# Patient Record
Sex: Male | Born: 1950 | Race: Black or African American | Hispanic: No | Smoking: Never smoker
Health system: Southern US, Community
[De-identification: ages and names within clinical notes are randomized; demographics above are authoritative.]

## PROBLEM LIST (undated history)

## (undated) DIAGNOSIS — E119 Type 2 diabetes mellitus without complications: Secondary | ICD-10-CM

## (undated) DIAGNOSIS — I639 Cerebral infarction, unspecified: Secondary | ICD-10-CM

## (undated) DIAGNOSIS — C801 Malignant (primary) neoplasm, unspecified: Secondary | ICD-10-CM

## (undated) DIAGNOSIS — E785 Hyperlipidemia, unspecified: Secondary | ICD-10-CM

## (undated) DIAGNOSIS — I10 Essential (primary) hypertension: Secondary | ICD-10-CM

## (undated) DIAGNOSIS — R011 Cardiac murmur, unspecified: Secondary | ICD-10-CM

## (undated) HISTORY — DX: Malignant (primary) neoplasm, unspecified: C80.1

## (undated) HISTORY — PX: OTHER SURGICAL HISTORY: SHX169

## (undated) SURGERY — EGD (ESOPHAGOGASTRODUODENOSCOPY)
Anesthesia: General

---

## 2004-04-02 ENCOUNTER — Emergency Department: Payer: Self-pay | Admitting: Emergency Medicine

## 2006-11-19 ENCOUNTER — Other Ambulatory Visit: Payer: Self-pay

## 2006-11-19 ENCOUNTER — Ambulatory Visit: Payer: Self-pay | Admitting: Urology

## 2006-12-02 ENCOUNTER — Ambulatory Visit: Payer: Self-pay | Admitting: Urology

## 2007-01-18 ENCOUNTER — Emergency Department: Payer: Self-pay

## 2007-08-19 ENCOUNTER — Emergency Department: Payer: Self-pay | Admitting: Emergency Medicine

## 2007-08-19 ENCOUNTER — Other Ambulatory Visit: Payer: Self-pay

## 2007-09-02 ENCOUNTER — Ambulatory Visit: Payer: Self-pay | Admitting: Family Medicine

## 2010-11-18 ENCOUNTER — Ambulatory Visit: Payer: Self-pay | Admitting: Family Medicine

## 2010-11-28 ENCOUNTER — Ambulatory Visit: Payer: Self-pay | Admitting: Family Medicine

## 2010-12-28 ENCOUNTER — Ambulatory Visit: Payer: Self-pay | Admitting: Family Medicine

## 2011-02-06 ENCOUNTER — Emergency Department: Payer: Self-pay | Admitting: *Deleted

## 2011-02-10 ENCOUNTER — Ambulatory Visit: Payer: Self-pay | Admitting: Physician Assistant

## 2015-01-28 DIAGNOSIS — G8191 Hemiplegia, unspecified affecting right dominant side: Secondary | ICD-10-CM | POA: Insufficient documentation

## 2015-01-28 HISTORY — DX: Hemiplegia, unspecified affecting right dominant side: G81.91

## 2015-06-23 ENCOUNTER — Emergency Department: Payer: Medicare Other

## 2015-06-23 ENCOUNTER — Inpatient Hospital Stay
Admission: EM | Admit: 2015-06-23 | Discharge: 2015-06-27 | DRG: 064 | Disposition: A | Discharge status: Home Health | Payer: Medicare Other | Attending: Internal Medicine | Admitting: Internal Medicine

## 2015-06-23 ENCOUNTER — Encounter: Payer: Self-pay | Admitting: Emergency Medicine

## 2015-06-23 DIAGNOSIS — Z79899 Other long term (current) drug therapy: Secondary | ICD-10-CM

## 2015-06-23 DIAGNOSIS — N179 Acute kidney failure, unspecified: Secondary | ICD-10-CM | POA: Diagnosis present

## 2015-06-23 DIAGNOSIS — G936 Cerebral edema: Secondary | ICD-10-CM | POA: Diagnosis not present

## 2015-06-23 DIAGNOSIS — I611 Nontraumatic intracerebral hemorrhage in hemisphere, cortical: Secondary | ICD-10-CM | POA: Diagnosis present

## 2015-06-23 DIAGNOSIS — I10 Essential (primary) hypertension: Secondary | ICD-10-CM | POA: Diagnosis present

## 2015-06-23 DIAGNOSIS — Z8249 Family history of ischemic heart disease and other diseases of the circulatory system: Secondary | ICD-10-CM

## 2015-06-23 DIAGNOSIS — R1310 Dysphagia, unspecified: Secondary | ICD-10-CM

## 2015-06-23 DIAGNOSIS — Z7984 Long term (current) use of oral hypoglycemic drugs: Secondary | ICD-10-CM

## 2015-06-23 DIAGNOSIS — R4701 Aphasia: Secondary | ICD-10-CM | POA: Diagnosis present

## 2015-06-23 DIAGNOSIS — E785 Hyperlipidemia, unspecified: Secondary | ICD-10-CM | POA: Diagnosis present

## 2015-06-23 DIAGNOSIS — R531 Weakness: Secondary | ICD-10-CM

## 2015-06-23 DIAGNOSIS — N289 Disorder of kidney and ureter, unspecified: Secondary | ICD-10-CM

## 2015-06-23 DIAGNOSIS — M6282 Rhabdomyolysis: Secondary | ICD-10-CM | POA: Diagnosis not present

## 2015-06-23 DIAGNOSIS — I63312 Cerebral infarction due to thrombosis of left middle cerebral artery: Secondary | ICD-10-CM | POA: Diagnosis not present

## 2015-06-23 DIAGNOSIS — G8191 Hemiplegia, unspecified affecting right dominant side: Secondary | ICD-10-CM | POA: Diagnosis present

## 2015-06-23 DIAGNOSIS — I340 Nonrheumatic mitral (valve) insufficiency: Secondary | ICD-10-CM | POA: Diagnosis present

## 2015-06-23 DIAGNOSIS — E876 Hypokalemia: Secondary | ICD-10-CM

## 2015-06-23 DIAGNOSIS — R4781 Slurred speech: Secondary | ICD-10-CM | POA: Diagnosis present

## 2015-06-23 DIAGNOSIS — I639 Cerebral infarction, unspecified: Principal | ICD-10-CM | POA: Diagnosis present

## 2015-06-23 DIAGNOSIS — E119 Type 2 diabetes mellitus without complications: Secondary | ICD-10-CM | POA: Diagnosis not present

## 2015-06-23 DIAGNOSIS — E1310 Other specified diabetes mellitus with ketoacidosis without coma: Secondary | ICD-10-CM | POA: Diagnosis not present

## 2015-06-23 DIAGNOSIS — R0902 Hypoxemia: Secondary | ICD-10-CM | POA: Diagnosis present

## 2015-06-23 DIAGNOSIS — R29810 Facial weakness: Secondary | ICD-10-CM | POA: Diagnosis present

## 2015-06-23 DIAGNOSIS — Z23 Encounter for immunization: Secondary | ICD-10-CM

## 2015-06-23 HISTORY — DX: Hyperlipidemia, unspecified: E78.5

## 2015-06-23 HISTORY — DX: Type 2 diabetes mellitus without complications: E11.9

## 2015-06-23 HISTORY — DX: Essential (primary) hypertension: I10

## 2015-06-23 LAB — URINALYSIS COMPLETE WITH MICROSCOPIC (ARMC ONLY)
Bacteria, UA: NONE SEEN
Bilirubin Urine: NEGATIVE
Glucose, UA: NEGATIVE mg/dL
Leukocytes, UA: NEGATIVE
Nitrite: NEGATIVE
Protein, ur: >500 mg/dL — AB
Specific Gravity, Urine: 1.025 (ref 1.005–1.030)
Squamous Epithelial / LPF: NONE SEEN
pH: 5 (ref 5.0–8.0)

## 2015-06-23 LAB — COMPREHENSIVE METABOLIC PANEL
ALT: 16 U/L — ABNORMAL LOW (ref 17–63)
AST: 43 U/L — ABNORMAL HIGH (ref 15–41)
Albumin: 4 g/dL (ref 3.5–5.0)
Alkaline Phosphatase: 80 U/L (ref 38–126)
Anion gap: 10 (ref 5–15)
BUN: 19 mg/dL (ref 6–20)
CO2: 26 mmol/L (ref 22–32)
Calcium: 9.3 mg/dL (ref 8.9–10.3)
Chloride: 106 mmol/L (ref 101–111)
Creatinine, Ser: 1.53 mg/dL — ABNORMAL HIGH (ref 0.61–1.24)
GFR calc Af Amer: 53 mL/min — ABNORMAL LOW (ref >60)
GFR calc non Af Amer: 46 mL/min — ABNORMAL LOW (ref >60)
Glucose, Bld: 137 mg/dL — ABNORMAL HIGH (ref 65–99)
Potassium: 3.4 mmol/L — ABNORMAL LOW (ref 3.5–5.1)
Sodium: 142 mmol/L (ref 135–145)
Total Bilirubin: 0.9 mg/dL (ref 0.3–1.2)
Total Protein: 7.5 g/dL (ref 6.5–8.1)

## 2015-06-23 LAB — SALICYLATE LEVEL: Salicylate Lvl: <4 mg/dL (ref 2.8–30.0)

## 2015-06-23 LAB — PROTIME-INR
INR: 1.09
Prothrombin Time: 14.3 seconds (ref 11.4–15.0)

## 2015-06-23 LAB — CBC WITH DIFFERENTIAL/PLATELET
Basophils Absolute: 0.1 10*3/uL (ref 0–0.1)
Basophils Relative: 1 %
Eosinophils Absolute: 0.2 10*3/uL (ref 0–0.7)
Eosinophils Relative: 2 %
HCT: 43.7 % (ref 40.0–52.0)
Hemoglobin: 13.9 g/dL (ref 13.0–18.0)
Lymphocytes Relative: 17 %
Lymphs Abs: 2 10*3/uL (ref 1.0–3.6)
MCH: 27.3 pg (ref 26.0–34.0)
MCHC: 31.8 g/dL — ABNORMAL LOW (ref 32.0–36.0)
MCV: 86 fL (ref 80.0–100.0)
Monocytes Absolute: 0.9 10*3/uL (ref 0.2–1.0)
Monocytes Relative: 8 %
Neutro Abs: 8.4 10*3/uL — ABNORMAL HIGH (ref 1.4–6.5)
Neutrophils Relative %: 72 %
Platelets: 215 10*3/uL (ref 150–440)
RBC: 5.08 MIL/uL (ref 4.40–5.90)
RDW: 13.8 % (ref 11.5–14.5)
WBC: 11.6 10*3/uL — ABNORMAL HIGH (ref 3.8–10.6)

## 2015-06-23 LAB — ACETAMINOPHEN LEVEL: Acetaminophen (Tylenol), Serum: <10 ug/mL — ABNORMAL LOW (ref 10–30)

## 2015-06-23 LAB — CK: Total CK: 2358 U/L — ABNORMAL HIGH (ref 49–397)

## 2015-06-23 LAB — ETHANOL: Alcohol, Ethyl (B): <5 mg/dL (ref <5)

## 2015-06-23 LAB — TROPONIN I: Troponin I: 0.03 ng/mL (ref <0.031)

## 2015-06-23 MED ORDER — ASPIRIN 325 MG PO TABS
325.0000 mg | ORAL_TABLET | Freq: Every day | ORAL | Status: DC
  Administered 2015-06-24: 325 mg via ORAL

## 2015-06-23 MED ORDER — SODIUM CHLORIDE 0.9 % IV BOLUS (SEPSIS)
500.0000 mL | Freq: Once | INTRAVENOUS | Status: AC
  Administered 2015-06-23: 500 mL via INTRAVENOUS

## 2015-06-23 MED ORDER — SODIUM CHLORIDE 0.9 % IV BOLUS (SEPSIS)
1000.0000 mL | Freq: Once | INTRAVENOUS | Status: AC
  Administered 2015-06-23: 1000 mL via INTRAVENOUS

## 2015-06-23 NOTE — H&P (Signed)
PCP:   No PCP Per Patient   Chief Complaint:  Confused  HPI: This is a 65 year old male whose friends unable to contact him for the past 2 days. They went over and saw him seated on the floor unable to come to the door. 911 was called, the house was forcibly entered and he was brought to the ER. In the ER the patient is disoriented, struggles to answer questions coherently. He is unable to give any detailed history as to what happened or when it started. His nephew came in during my examination and states at baseline this is a independent gentleman who lives alone.  Review of Systems:  Unable to properly   Past Medical History: History reviewed. Diabetes mellitus, hypertension No past surgical history: None  Medications: Prior to Admission medications   Medication Sig Start Date End Date Taking? Authorizing Provider  amLODipine (NORVASC) 5 MG tablet Take 5 mg by mouth daily. 04/11/15  Yes Historical Provider, MD  atorvastatin (LIPITOR) 80 MG tablet Take 80 mg by mouth at bedtime. 04/11/15  Yes Historical Provider, MD  glyBURIDE-metformin (GLUCOVANCE) 5-500 MG tablet Take 1 tablet by mouth 2 (two) times daily. 06/07/15  Yes Historical Provider, MD  lisinopril-hydrochlorothiazide (PRINZIDE,ZESTORETIC) 20-12.5 MG tablet Take 2 tablets by mouth daily. 06/07/15  Yes Historical Provider, MD    Allergies:  No Known Allergies  Social History:  Detected tobacco alcohol or illicit drugs. Patient lives alone  Family History: History reviewed. Coronary artery disease  Physical Exam: Filed Vitals:   06/23/15 2230 06/23/15 2300 06/23/15 2315 06/23/15 2330  BP: 159/82 170/64  171/77  Pulse: 61 66 55 65  Temp:      TempSrc:      Resp:  14 24 20   SpO2: 92% 94% 90% 94%    General:  Confused disoriented patient, does converse and follow instructions sluggishly Eyes: PERRLA, pink conjunctiva, no scleral icterus ENT: Moist oral mucosa, neck supple, no thyromegaly Lungs: clear to ascultation,  no wheeze, no crackles, no use of accessory muscles Cardiovascular: regular rate and rhythm, no regurgitation, no gallops, no murmurs. No carotid bruits, no JVD Abdomen: soft, positive BS, non-tender, non-distended, no organomegaly, not an acute abdomen GU: not examined Neuro: CN II - XII grossly intact except tongue deviates her right, unable to properly assess sensation Musculoskeletal: strength 5/5 all extremities except right lower extremity 3/5, no clubbing, cyanosis or edema Skin: no rash, no subcutaneous crepitation, no decubitus Psych: Confused, disoriented patient    Labs on Admission:   Recent Labs  06/23/15 1849  NA 142  K 3.4*  CL 106  CO2 26  GLUCOSE 137*  BUN 19  CREATININE 1.53*  CALCIUM 9.3    Recent Labs  06/23/15 1849  AST 43*  ALT 16*  ALKPHOS 80  BILITOT 0.9  PROT 7.5  ALBUMIN 4.0   No results for input(s): LIPASE, AMYLASE in the last 72 hours.  Recent Labs  06/23/15 1849  WBC 11.6*  NEUTROABS 8.4*  HGB 13.9  HCT 43.7  MCV 86.0  PLT 215    Recent Labs  06/23/15 1849  CKTOTAL 2358*  TROPONINI 0.03   Invalid input(s): POCBNP No results for input(s): DDIMER in the last 72 hours. No results for input(s): HGBA1C in the last 72 hours. No results for input(s): CHOL, HDL, LDLCALC, TRIG, CHOLHDL, LDLDIRECT in the last 72 hours. No results for input(s): TSH, T4TOTAL, T3FREE, THYROIDAB in the last 72 hours.  Invalid input(s): FREET3 No results for input(s): VITAMINB12, FOLATE,  FERRITIN, TIBC, IRON, RETICCTPCT in the last 72 hours.  Micro Results: No results found for this or any previous visit (from the past 240 hour(s)).   Radiological Exams on Admission: Dg Chest 2 View  06/23/2015  CLINICAL DATA:  Patient with weakness, found naked on the floor. EXAM: CHEST  2 VIEW COMPARISON:  Chest radiograph 02/06/2011. FINDINGS: Stable cardiac and mediastinal contours. Low lung volumes. Mild basilar airspace opacities. No pleural effusion or  pneumothorax. Mid thoracic spine degenerative changes. IMPRESSION: Low lung volumes with basilar atelectasis. Electronically Signed   By: Lovey Newcomer M.D.   On: 06/23/2015 19:22   Ct Head Wo Contrast  06/23/2015  CLINICAL DATA:  Patient with confusion found naked on the floor. EXAM: CT HEAD WITHOUT CONTRAST TECHNIQUE: Contiguous axial images were obtained from the base of the skull through the vertex without intravenous contrast. COMPARISON:  None. FINDINGS: Ventricles and sulci are appropriate for patient's age. There is a large area (3 cm) of hypodensity within the left basal ganglia extending to the left caudate head. There is central increased attenuation (image 14; series 2). Extensive periventricular and subcortical white matter hypodensity. There is mass effect on the frontal horn of left lateral ventricle. Within the peripheral right frontal lobe (image 17; series 2) there is a small area of hypodensity, age indeterminate. Orbits are unremarkable. Paranasal sinuses are well aerated. Mastoid air cells unremarkable. Calvarium is intact. 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 IMPRESSION: There is a subacute left basal ganglia infarct involving the caudate head with associated petechial hemorrhages. There is mass effect on the frontal horn left lateral ventricle. Suggestion of hypodensity, age indeterminate, within the peripheral right frontal lobe raising the possibility of an age-indeterminate infarct versus potentially volume averaging of adjacent sulci. Chronic microvascular ischemic changes. Critical Value/emergent results were called by telephone at the time of interpretation on 06/23/2015 at 7:36 pm to Dr. Charlotte Crumb , who verbally acknowledged these results. Electronically Signed   By: Lovey Newcomer M.D.   On: 06/23/2015 19:42    Assessment/Plan Present on Admission:  . 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 -Lipid panel in a.m., resume home statin dose -PT, OT, speech consulted for the a.m. Patient nothing by mouth -Neurochecks scheduled -Patient with small petechial hemorrhage -Neuro consulted, patient discussed with Dr. Irish Elders neurointensivist. Burnis Medin upgrade patient to ICU and consult intensivist. We will add to mannitol 25g IV every 8 hours as well as serum osmolality every 8 hours  Diabetes mellitus type 2 -Patient is nothing by mouth for now, sliding-scale insulin  Malignant hypertension -See above   SCDs only for DVT prophylaxis  Jaasia Viglione 06/23/2015, 11:45 PM

## 2015-06-23 NOTE — ED Provider Notes (Addendum)
[facility] Emergency Department Provider Note  ____________________________________________   I have reviewed the triage vital signs and the nursing notes.   HISTORY  Chief Complaint Altered Mental Status    HPI Christopher Delacruz is a 65 y.o. male presents to the complaining of nothing. He actually feels "fine". However, it is unclear when the last time he was seen normal was. EMS was called to the house because he would not come to the door for his friends. He kept saying he would be there but never came in after 45 minutes they called 911. 911 and fire went through the window and found him sitting naked on the floor. He was able to walk with assistance to the door. He seemed somewhat confused to them. However, there are stroke scale seemed to be negative. Patient denies any fall or injury. He states he feels "pretty good. He does not know what  concern is. His brother is at bedside. He states the patient is speaking normally. He states that he looks "okay to me". However, he cannot recall the last time he was seen prior to this event. The patient specifically denies any headache stiff neck chest pain shows breath nausea vomiting diarrhea abdominal pain focal numbness focal weakness difficulty talking or difficulty walking.      History reviewed. No pertinent past medical history.  There are no active problems to display for this patient.   No past surgical history on file.  No current outpatient prescriptions on file.  Allergies Review of patient's allergies indicates no known allergies.  History reviewed. No pertinent family history.  Social History Social History  Substance Use Topics  . Smoking status: None  . Smokeless tobacco: None  . Alcohol Use: None    Review of Systems Constitutional: No fever/chills Eyes: No visual changes. ENT: No sore throat. No stiff neck no neck pain Cardiovascular: Denies chest pain. Respiratory: Denies shortness of  breath. Gastrointestinal:   no vomiting.  No diarrhea.  No constipation. Genitourinary: Negative for dysuria. Musculoskeletal: Negative lower extremity swelling Skin: Negative for rash. Neurological: Negative for headaches, focal weakness or numbness. 10-point ROS otherwise negative.  ____________________________________________   PHYSICAL EXAM:  VITAL SIGNS: ED Triage Vitals  Enc Vitals Group     BP 06/23/15 1851 159/86 mmHg     Pulse Rate 06/23/15 1851 78     Resp 06/23/15 1851 20     Temp 06/23/15 1851 97.6 F (36.4 C)     Temp Source 06/23/15 1851 Oral     SpO2 06/23/15 1851 89 %     Weight --      Height --      Head Cir --      Peak Flow --      Pain Score --      Pain Loc --      Pain Edu? --      Excl. in [place]? --     Constitutional: Alert and oriented To name and place states "I forgot" when asked the date although he knows it Saturday. Well appearing and in no acute distress. Eyes: Conjunctivae are normal. PERRL. EOMI. Head: Atraumatic. Nose: No congestion/rhinnorhea. Mouth/Throat: Mucous membranes are moist.  Oropharynx non-erythematous. Neck: No stridor.   Nontender with no meningismus Cardiovascular: Normal rate, regular rhythm. Grossly normal heart sounds.  Good peripheral circulation. Respiratory: Normal respiratory effort.  No retractions. Lungs CTAB. Abdominal: Soft and nontender. No distention. No guarding no rebound Back:  There is no focal tenderness or  step off there is no midline tenderness there are no lesions noted. there is no CVA tenderness Musculoskeletal: No lower extremity tenderness. No joint effusions, no DVT signs strong distal pulses no edema Neurologic: Patient does have a 4+ strength on the right lower extremity versus 5 on the left, his speech seems slurred to me but his brother says it is normal.  Has a slight droop on the right face.  However brother feels that this is his baseline. ftn is within normal limits sensation intact speech ,  no drift on the upper extremity, sensation appears to be intact Skin:  Skin is warm, dry and intact. No rash noted. Psychiatric: Mood and affect are normal. Speech and behavior are normal.  ____________________________________________   LABS (all labs ordered are listed, but only abnormal results are displayed)  Labs Reviewed  CBC WITH DIFFERENTIAL/PLATELET - Abnormal; Notable for the following:    WBC 11.6 (*)    MCHC 31.8 (*)    Neutro Abs 8.4 (*)    All other components within normal limits  PROTIME-INR  CK  URINALYSIS COMPLETEWITH MICROSCOPIC ([facility] ONLY)  ETHANOL  COMPREHENSIVE METABOLIC PANEL  TROPONIN I  ACETAMINOPHEN LEVEL  SALICYLATE LEVEL   ____________________________________________  EKG  I personally interpreted any EKGs ordered by me or triage Normal sinus rhythm rate 70 bpm no acute ST elevation or acute ST depression nonspecific ST changes noted ____________________________________________  RADIOLOGY  I reviewed any imaging ordered by me or triage that were performed during my shift and, if possible, patient and/or family made aware of any abnormal findings. ____________________________________________   PROCEDURES  Procedure(s) performed: None  Critical Care performed: None  ____________________________________________   INITIAL IMPRESSION / ASSESSMENT AND PLAN / ED COURSE  Pertinent labs & imaging results that were available during my care of the patient were reviewed by me and considered in my medical decision making (see chart for details).  Patient apparently was acting bizarrely today was either unable or unwilling to come to the door was sitting naked on the floor. He does have some right-sided weakness to a somewhat variable exam at this time in the lower extremity.  Has a right facial droop. Last time he was normal as absolutely unknown. His brother does not recall the last time he talked room. He thinks it was possibly yesterday. There is  been some concern regarding his speech. It is slurred to my exam but his brother feels that this is his normal speech.In any event, we will obtain CT of the head chest x-ray blood work including total CK and reassess. Vital signs are reassuring aside from a reported I's and set of 89, lungs are clear, this time he has no increased work of breathing we will recheck that to make sure it is real.  ----------------------------------------- 7:51 PM on 06/23/2015 -----------------------------------------  Patient's adamant that he is not on any blood thinners. He states now that he feels like he might of been feeling unwell since Wednesday. This is not a reliable history. CT shows an subacute CVA with a possible mild petechial hemorrhagic convergence. This is likely more than this facility can handle given that we do not have neurosurgery backup if it worsens. We will call [facility] neurology. For this reason I'll defer aspirin. Patient states he takes no aspirin or blood thinners. On record, we have no medications for this patient. The pharmacist is trying to track down what he takes. His brother had no idea.  ----------------------------------------- 7:58 PM on 06/23/2015 -----------------------------------------  Dw/  [facility] neurology who feel pt does not need transfer ____________________________________________   FINAL CLINICAL IMPRESSION(S) / ED DIAGNOSES  Final diagnoses:  None      This chart was dictated using voice recognition software.  Despite best efforts to proofread,  errors can occur which can change meaning.     Schuyler Amor, MD 06/23/15 [place], MD 06/23/15 [place], MD 06/24/15 [phone]

## 2015-06-23 NOTE — ED Notes (Signed)
Spoke w/ ICU, they are unable to take pt until admission orders have been put in.  Spoke w/ MD Crosley about putting in admission orders.  She stated that "my phone is going off every 5 minutes... I will get to it when I get to it".  ICU informed.

## 2015-06-23 NOTE — ED Notes (Signed)
Pt presents to ED via EMS from home c/o altered mental status. EMS called out by pt's friends who were calling pt from outside the house. Friends told EMS pt kept repeating that he was coming to the door but did not show up for 45 min. EMS and fire dept found pt naked on floor, pt states he has been there since Thursday. Presents with slurred speech, O2 sat 89% on RA. Placed on 3L n/c. Last time seen normal Thursday. CBG 136 per EMS.

## 2015-06-24 ENCOUNTER — Inpatient Hospital Stay: Payer: Medicare Other

## 2015-06-24 ENCOUNTER — Encounter: Payer: Self-pay | Admitting: *Deleted

## 2015-06-24 DIAGNOSIS — E1310 Other specified diabetes mellitus with ketoacidosis without coma: Secondary | ICD-10-CM

## 2015-06-24 DIAGNOSIS — I10 Essential (primary) hypertension: Secondary | ICD-10-CM

## 2015-06-24 DIAGNOSIS — I639 Cerebral infarction, unspecified: Principal | ICD-10-CM

## 2015-06-24 DIAGNOSIS — I63312 Cerebral infarction due to thrombosis of left middle cerebral artery: Secondary | ICD-10-CM

## 2015-06-24 LAB — GLUCOSE, CAPILLARY
Glucose-Capillary: 105 mg/dL — ABNORMAL HIGH (ref 65–99)
Glucose-Capillary: 115 mg/dL — ABNORMAL HIGH (ref 65–99)
Glucose-Capillary: 151 mg/dL — ABNORMAL HIGH (ref 65–99)
Glucose-Capillary: 132 mg/dL — ABNORMAL HIGH (ref 65–99)
Glucose-Capillary: 147 mg/dL — ABNORMAL HIGH (ref 65–99)
Glucose-Capillary: 95 mg/dL (ref 65–99)

## 2015-06-24 LAB — HEMOGLOBIN A1C: Hgb A1c MFr Bld: 5.8 % (ref 4.0–6.0)

## 2015-06-24 LAB — LIPID PANEL
Cholesterol: 131 mg/dL (ref 0–200)
HDL: 34 mg/dL — ABNORMAL LOW (ref >40)
LDL Cholesterol: 84 mg/dL (ref 0–99)
Total CHOL/HDL Ratio: 3.9 RATIO
Triglycerides: 66 mg/dL (ref <150)
VLDL: 13 mg/dL (ref 0–40)

## 2015-06-24 LAB — BASIC METABOLIC PANEL
Anion gap: 7 (ref 5–15)
BUN: 16 mg/dL (ref 6–20)
CO2: 27 mmol/L (ref 22–32)
Calcium: 9 mg/dL (ref 8.9–10.3)
Chloride: 109 mmol/L (ref 101–111)
Creatinine, Ser: 1.36 mg/dL — ABNORMAL HIGH (ref 0.61–1.24)
GFR calc Af Amer: >60 mL/min (ref >60)
GFR calc non Af Amer: 53 mL/min — ABNORMAL LOW (ref >60)
Glucose, Bld: 114 mg/dL — ABNORMAL HIGH (ref 65–99)
Potassium: 3.5 mmol/L (ref 3.5–5.1)
Sodium: 143 mmol/L (ref 135–145)

## 2015-06-24 LAB — CBC
HCT: 42.2 % (ref 40.0–52.0)
Hemoglobin: 13.7 g/dL (ref 13.0–18.0)
MCH: 27.9 pg (ref 26.0–34.0)
MCHC: 32.5 g/dL (ref 32.0–36.0)
MCV: 86 fL (ref 80.0–100.0)
Platelets: 180 10*3/uL (ref 150–440)
RBC: 4.91 MIL/uL (ref 4.40–5.90)
RDW: 13.7 % (ref 11.5–14.5)
WBC: 10 10*3/uL (ref 3.8–10.6)

## 2015-06-24 LAB — OSMOLALITY
Osmolality: 300 mOsm/kg — ABNORMAL HIGH (ref 275–295)
Osmolality: 303 mOsm/kg — ABNORMAL HIGH (ref 275–295)

## 2015-06-24 LAB — MRSA PCR SCREENING: MRSA by PCR: NEGATIVE

## 2015-06-24 MED ORDER — AMLODIPINE BESYLATE 5 MG PO TABS
5.0000 mg | ORAL_TABLET | Freq: Every day | ORAL | Status: DC
  Administered 2015-06-24 – 2015-06-27 (×4): 5 mg via ORAL
  Filled 2015-06-24 (×4): qty 1

## 2015-06-24 MED ORDER — METOPROLOL TARTRATE 5 MG/5ML IV SOLN: 5.0000 mg | Freq: Four times a day (QID) | INTRAVENOUS | Status: DC | PRN

## 2015-06-24 MED ORDER — ACETAMINOPHEN 650 MG RE SUPP: 650.0000 mg | Freq: Four times a day (QID) | RECTAL | Status: DC | PRN

## 2015-06-24 MED ORDER — HYDRALAZINE HCL 20 MG/ML IJ SOLN: 10.0000 mg | INTRAMUSCULAR | Status: DC | PRN

## 2015-06-24 MED ORDER — MANNITOL 25 % IV SOLN
25.0000 g | Freq: Three times a day (TID) | INTRAVENOUS | Status: DC
  Administered 2015-06-24: 25 g via INTRAVENOUS
  Filled 2015-06-24 (×5): qty 100

## 2015-06-24 MED ORDER — INSULIN ASPART 100 UNIT/ML ~~LOC~~ SOLN: 0.0000 [IU] | Freq: Every day | SUBCUTANEOUS | Status: DC

## 2015-06-24 MED ORDER — ONDANSETRON HCL 4 MG/2ML IJ SOLN: 4.0000 mg | Freq: Four times a day (QID) | INTRAMUSCULAR | Status: DC | PRN

## 2015-06-24 MED ORDER — PNEUMOCOCCAL VAC POLYVALENT 25 MCG/0.5ML IJ INJ
0.5000 mL | INJECTION | INTRAMUSCULAR | Status: AC
  Administered 2015-06-25: 10:00:00 0.5 mL via INTRAMUSCULAR
  Filled 2015-06-24: qty 0.5

## 2015-06-24 MED ORDER — ACETAMINOPHEN 325 MG PO TABS: 650.0000 mg | ORAL_TABLET | Freq: Four times a day (QID) | ORAL | Status: DC | PRN

## 2015-06-24 MED ORDER — INSULIN ASPART 100 UNIT/ML ~~LOC~~ SOLN
0.0000 [IU] | Freq: Three times a day (TID) | SUBCUTANEOUS | Status: DC
Start: 2015-06-24 — End: 2015-06-27
  Administered 2015-06-24 (×2): 2 [IU] via SUBCUTANEOUS
  Administered 2015-06-25: 13:00:00 3 [IU] via SUBCUTANEOUS
  Administered 2015-06-25: 17:00:00 2 [IU] via SUBCUTANEOUS
  Administered 2015-06-26: 3 [IU] via SUBCUTANEOUS
  Administered 2015-06-26 – 2015-06-27 (×2): 2 [IU] via SUBCUTANEOUS
  Administered 2015-06-27: 3 [IU] via SUBCUTANEOUS
  Filled 2015-06-24: qty 2
  Filled 2015-06-24: qty 3
  Filled 2015-06-24 (×3): qty 2
  Filled 2015-06-24: qty 3
  Filled 2015-06-24: qty 2
  Filled 2015-06-24: qty 3

## 2015-06-24 MED ORDER — ATORVASTATIN CALCIUM 20 MG PO TABS
80.0000 mg | ORAL_TABLET | Freq: Every day | ORAL | Status: DC
  Administered 2015-06-24 – 2015-06-26 (×3): 80 mg via ORAL
  Filled 2015-06-24 (×3): qty 4

## 2015-06-24 MED ORDER — GLYBURIDE 5 MG PO TABS
5.0000 mg | ORAL_TABLET | Freq: Two times a day (BID) | ORAL | Status: DC
  Filled 2015-06-24 (×3): qty 1

## 2015-06-24 MED ORDER — SODIUM CHLORIDE 0.9% FLUSH
3.0000 mL | Freq: Two times a day (BID) | INTRAVENOUS | Status: DC
  Administered 2015-06-24 – 2015-06-27 (×8): 3 mL via INTRAVENOUS

## 2015-06-24 MED ORDER — ONDANSETRON HCL 4 MG PO TABS: 4.0000 mg | ORAL_TABLET | Freq: Four times a day (QID) | ORAL | Status: DC | PRN

## 2015-06-24 MED ORDER — LISINOPRIL 20 MG PO TABS
40.0000 mg | ORAL_TABLET | Freq: Every day | ORAL | Status: DC
  Administered 2015-06-24: 40 mg via ORAL
  Filled 2015-06-24 (×2): qty 2

## 2015-06-24 MED ORDER — LEVETIRACETAM 500 MG/5ML IV SOLN
500.0000 mg | Freq: Two times a day (BID) | INTRAVENOUS | Status: DC
  Administered 2015-06-24: 500 mg via INTRAVENOUS
  Filled 2015-06-24 (×2): qty 5

## 2015-06-24 MED ORDER — SENNOSIDES-DOCUSATE SODIUM 8.6-50 MG PO TABS: 1.0000 | ORAL_TABLET | Freq: Every evening | ORAL | Status: DC | PRN

## 2015-06-24 MED ORDER — GLYBURIDE-METFORMIN 5-500 MG PO TABS: 1.0000 | ORAL_TABLET | Freq: Two times a day (BID) | ORAL | Status: DC

## 2015-06-24 MED ORDER — HYDROCHLOROTHIAZIDE 25 MG PO TABS
25.0000 mg | ORAL_TABLET | Freq: Every day | ORAL | Status: DC
  Administered 2015-06-24: 25 mg via ORAL
  Filled 2015-06-24 (×2): qty 1

## 2015-06-24 MED ORDER — METFORMIN HCL 500 MG PO TABS: 500.0000 mg | ORAL_TABLET | Freq: Two times a day (BID) | ORAL | Status: DC

## 2015-06-24 NOTE — Consult Note (Signed)
CC: L BG stroke  HPI: Christopher Delacruz is an 65 y.o. male whose friends unable to contact him for the past 2 days prior to admission. Information obtained from pt's brother Christopher Delacruz.  They went over and saw him seated on the floor unable to come to the door. 911 was called, the house was forcibly entered and he was brought to the ER. CTH shows subacute L BG stroke and caudate infarct with slight edema.    Past Medical History  Diagnosis Date  . Hypertension   . Hyperlipidemia   . Diabetes mellitus ([place])     History reviewed. No pertinent past surgical history.  History reviewed. No pertinent family history.  Social History:  reports that he has never smoked. He does not have any smokeless tobacco history on file. His alcohol and drug histories are not on file.  No Known Allergies  Medications: I have reviewed the patient's current medications.  ROS: Unable to obtain   Physical Examination: Blood pressure 154/73, pulse 54, temperature 97.6 F (36.4 C), temperature source Oral, resp. rate 17, height 6\' 7"  (2.007 m), weight 116.8 kg (257 lb 8 oz), SpO2 95 %.    Neurological Examination Mental Status: Alert, oriented to name Cranial Nerves: II: Discs flat bilaterally; Visual fields grossly normal, pupils equal, round, reactive to light and accommodation III,IV, VI: ptosis not present, extra-ocular motions intact bilaterally V,VII: right facial droop VIII: hearing normal bilaterally IX,X: gag reflex present XI: bilateral shoulder shrug XII: midline tongue extension Motor: Right : Upper extremity   4/5    Left:     Upper extremity   5/5  Lower extremity   4/5     Lower extremity   5/5 Tone and bulk:normal tone throughout; no atrophy noted Sensory: Pinprick and light touch intact throughout, bilaterally Deep Tendon Reflexes: 1+ and symmetric throughout Plantars: Right: downgoing   Left: downgoing Cerebellar: normal finger-to-nose, normal rapid alternating movements and normal  heel-to-shin test Gait: not tested      Laboratory Studies:   Basic Metabolic Panel:  Recent Labs Lab 06/23/15 1849 06/24/15 0339  NA 142 143  K 3.4* 3.5  CL 106 109  CO2 26 27  GLUCOSE 137* 114*  BUN 19 16  CREATININE 1.53* 1.36*  CALCIUM 9.3 9.0    Liver Function Tests:  Recent Labs Lab 06/23/15 1849  AST 43*  ALT 16*  ALKPHOS 80  BILITOT 0.9  PROT 7.5  ALBUMIN 4.0   No results for input(s): LIPASE, AMYLASE in the last 168 hours. No results for input(s): AMMONIA in the last 168 hours.  CBC:  Recent Labs Lab 06/23/15 1849 06/24/15 0339  WBC 11.6* 10.0  NEUTROABS 8.4*  --   HGB 13.9 13.7  HCT 43.7 42.2  MCV 86.0 86.0  PLT 215 180    Cardiac Enzymes:  Recent Labs Lab 06/23/15 1849  CKTOTAL 2358*  TROPONINI 0.03    BNP: Invalid input(s): POCBNP  CBG:  Recent Labs Lab 06/24/15 0144 06/24/15 0718  GLUCAP 105* 115*    Microbiology: Results for orders placed or performed during the hospital encounter of 06/23/15  MRSA PCR Screening     Status: None   Collection Time: 06/24/15  2:06 AM  Result Value Ref Range Status   MRSA by PCR NEGATIVE NEGATIVE Final    Comment:        The GeneXpert MRSA Assay (FDA approved for NASAL specimens only), is one component of a comprehensive MRSA colonization surveillance program. It is not  intended to diagnose MRSA infection nor to guide or monitor treatment for MRSA infections.     Coagulation Studies:  Recent Labs  06/23/15 1849  LABPROT 14.3  INR 1.09    Urinalysis:  Recent Labs Lab 06/23/15 1955  COLORURINE YELLOW*  LABSPEC 1.025  PHURINE 5.0  GLUCOSEU NEGATIVE  HGBUR 1+*  BILIRUBINUR NEGATIVE  KETONESUR TRACE*  PROTEINUR >500*  NITRITE NEGATIVE  LEUKOCYTESUR NEGATIVE    Lipid Panel:     Component Value Date/Time   CHOL 131 06/24/2015 0339   TRIG 66 06/24/2015 0339   HDL 34* 06/24/2015 0339   CHOLHDL 3.9 06/24/2015 0339   VLDL 13 06/24/2015 0339   LDLCALC 84  06/24/2015 0339    HgbA1C: No results found for: HGBA1C  Urine Drug Screen:  No results found for: LABOPIA, COCAINSCRNUR, LABBENZ, AMPHETMU, THCU, LABBARB  Alcohol Level:  Recent Labs Lab 06/23/15 [place] <5     Dg Chest 2 View  06/23/2015  CLINICAL DATA:  Patient with weakness, found naked on the floor. EXAM: CHEST  2 VIEW COMPARISON:  Chest radiograph 02/06/2011. FINDINGS: Stable cardiac and mediastinal contours. Low lung volumes. Mild basilar airspace opacities. No pleural effusion or pneumothorax. Mid thoracic spine degenerative changes. IMPRESSION: Low lung volumes with basilar atelectasis. Electronically Signed   By: Lovey Newcomer M.D.   On: 06/23/2015 19:22   Ct Head Wo Contrast  06/23/2015  CLINICAL DATA:  Patient with confusion found naked on the floor. EXAM: CT HEAD WITHOUT CONTRAST TECHNIQUE: Contiguous axial images were obtained from the base of the skull through the vertex without intravenous contrast. COMPARISON:  None. FINDINGS: Ventricles and sulci are appropriate for patient's age. There is a large area (3 cm) of hypodensity within the left basal ganglia extending to the left caudate head. There is central increased attenuation (image 14; series 2). Extensive periventricular and subcortical white matter hypodensity. There is mass effect on the frontal horn of left lateral ventricle. Within the peripheral right frontal lobe (image 17; series 2) there is a small area of hypodensity, age indeterminate. Orbits are unremarkable. Paranasal sinuses are well aerated. Mastoid air cells unremarkable. Calvarium is intact. 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 IMPRESSION: There is a subacute left basal ganglia infarct involving the caudate head with associated petechial hemorrhages. There is mass effect on the frontal horn left lateral ventricle. Suggestion of hypodensity, age indeterminate, within the peripheral right frontal lobe raising the possibility of an age-indeterminate infarct  versus potentially volume averaging of adjacent sulci. Chronic microvascular ischemic changes. Critical Value/emergent results were called by telephone at the time of interpretation on 06/23/2015 at 7:36 pm to Dr. Charlotte Crumb , who verbally acknowledged these results. Electronically Signed   By: Lovey Newcomer M.D.   On: 06/23/2015 19:42     Assessment/Plan:  65 y.o. male whose friends unable to contact him for the past 2 days prior to admission. Information obtained from pt's brother Christopher Delacruz.  They went over and saw him seated on the floor unable to come to the door. 911 was called, the house was forcibly entered and he was brought to the ER. CTH shows subacute L BG stroke and caudate infarct with slight edema.     Subcortical subacute infarct with improved mental status.  - d/c mannitol as this is subcortical - no need for anti epileptics for same reason as above - will need pt/ot - anti platelet therapy/statin - lives alone will need case management evaluation/social work for placement - likely  can come out of ICU today - if possible would like MRI brain routine to look for possible other areas of ischemia  Shelia Kingsberry  06/24/2015, 10:18 AM

## 2015-06-24 NOTE — Progress Notes (Addendum)
Pt blood pressure more controlled, did not have to give any prn for blood pressure. Slowly coming down.  Patient remains slow to respond and unable to get out all his words. Had to put 2L O2 on patient for sats on RA 88% - 89%.

## 2015-06-24 NOTE — ED Notes (Signed)
Transporting to ICU with RN Judson Roch

## 2015-06-24 NOTE — Progress Notes (Signed)
[facility] [place] NAME: Christopher Delacruz    MR#:  [id number]  DATE OF BIRTH:  1950-02-26  SUBJECTIVE:  CHIEF COMPLAINT:   Chief Complaint  Patient presents with  . Altered Mental Status  The patient is a 65 year old African-American male with past medical history significant for diabetes, essential hypertension, who presents to the hospital with complaints of right weakness, disorientation, slurred speech, difficult to express himself. On arrival to the hospital patient had a CT scan of head done revealing subacute left basilar ganglia infarct involving cortex Haight had and petechial hemorrhages, there was also mass effect on the frontal horn of left ventricle, patient was initiated on mannitol and the admitted to intensive care unit. He was seen by neurologist, mantle was discontinued and patient was transferred to regular medical floor. Patient complains of right-sided weakness, right lower extremity more than right upper extremity, some facial weakness, difficulty speaking.  Review of Systems  Constitutional: Negative for fever, chills and weight loss.  HENT: Negative for congestion.   Eyes: Negative for blurred vision and double vision.  Respiratory: Negative for cough, sputum production, shortness of breath and wheezing.   Cardiovascular: Negative for chest pain, palpitations, orthopnea, leg swelling and PND.  Gastrointestinal: Negative for nausea, vomiting, abdominal pain, diarrhea, constipation and blood in stool.  Genitourinary: Negative for dysuria, urgency, frequency and hematuria.  Musculoskeletal: Negative for falls.  Neurological: Positive for focal weakness. Negative for dizziness, tremors and headaches.  Endo/Heme/Allergies: Does not bruise/bleed easily.  Psychiatric/Behavioral: Negative for depression. The patient does not have insomnia.     VITAL SIGNS: Blood pressure 191/90, pulse 55, temperature 97.6 F (36.4 C),  temperature source Oral, resp. rate 21, height 6\' 7"  (2.007 m), weight 116.8 kg (257 lb 8 oz), SpO2 93 %.  PHYSICAL EXAMINATION:   GENERAL:  65 y.o.-year-old patient lying in the bed with no acute distress. Poor eye contact, slurring speech, has difficulty expressing himself, mostly answers questions yes and no, appropriately EYES: Pupils equal, round, reactive to light and accommodation. No scleral icterus. Extraocular muscles intact.  HEENT: Head atraumatic, normocephalic. Oropharynx and nasopharynx clear.  NECK:  Supple, no jugular venous distention. No thyroid enlargement, no tenderness.  LUNGS: Normal breath sounds bilaterally, no wheezing, rales,rhonchi or crepitation. No use of accessory muscles of respiration.  CARDIOVASCULAR: S1, S2 normal. No murmurs, rubs, or gallops.  ABDOMEN: Soft, nontender, nondistended. Bowel sounds present. No organomegaly or mass.  EXTREMITIES: No pedal edema, cyanosis, or clubbing.  NEUROLOGIC: Cranial nerves II through XII , mild right-sided weakness, forehead is.Marland Kitchen spared. Muscle strength 2 -3/5 in  right lower extremity, 4/5 right upper extremity, 5 out of 5 in the left-sided extremities. Sensation intact. Gait not checked.  PSYCHIATRIC: The patient is alert and oriented x 3.  SKIN: No obvious rash, lesion, or ulcer.   ORDERS/RESULTS REVIEWED:   CBC  Recent Labs Lab 06/23/15 1849 06/24/15 0339  WBC 11.6* 10.0  HGB 13.9 13.7  HCT 43.7 42.2  PLT 215 180  MCV 86.0 86.0  MCH 27.3 27.9  MCHC 31.8* 32.5  RDW 13.8 13.7  LYMPHSABS 2.0  --   MONOABS 0.9  --   EOSABS 0.2  --   BASOSABS 0.1  --    ------------------------------------------------------------------------------------------------------------------  Chemistries   Recent Labs Lab 06/23/15 1849 06/24/15 0339  NA 142 143  K 3.4* 3.5  CL 106 109  CO2 26 27  GLUCOSE 137* 114*  BUN 19 16  CREATININE 1.53*  1.36*  CALCIUM 9.3 9.0  AST 43*  --   ALT 16*  --   ALKPHOS 80  --    BILITOT 0.9  --    ------------------------------------------------------------------------------------------------------------------ estimated creatinine clearance is 77.8 mL/min (by C-G formula based on Cr of 1.36). ------------------------------------------------------------------------------------------------------------------ No results for input(s): TSH, T4TOTAL, T3FREE, THYROIDAB in the last 72 hours.  Invalid input(s): FREET3  Cardiac Enzymes  Recent Labs Lab 06/23/15 1849  TROPONINI 0.03   ------------------------------------------------------------------------------------------------------------------ Invalid input(s): POCBNP ---------------------------------------------------------------------------------------------------------------  RADIOLOGY: Dg Chest 2 View  06/23/2015  CLINICAL DATA:  Patient with weakness, found naked on the floor. EXAM: CHEST  2 VIEW COMPARISON:  Chest radiograph 02/06/2011. FINDINGS: Stable cardiac and mediastinal contours. Low lung volumes. Mild basilar airspace opacities. No pleural effusion or pneumothorax. Mid thoracic spine degenerative changes. IMPRESSION: Low lung volumes with basilar atelectasis. Electronically Signed   By: Lovey Newcomer M.D.   On: 06/23/2015 19:22   Ct Head Wo Contrast  06/23/2015  CLINICAL DATA:  Patient with confusion found naked on the floor. EXAM: CT HEAD WITHOUT CONTRAST TECHNIQUE: Contiguous axial images were obtained from the base of the skull through the vertex without intravenous contrast. COMPARISON:  None. FINDINGS: Ventricles and sulci are appropriate for patient's age. There is a large area (3 cm) of hypodensity within the left basal ganglia extending to the left caudate head. There is central increased attenuation (image 14; series 2). Extensive periventricular and subcortical white matter hypodensity. There is mass effect on the frontal horn of left lateral ventricle. Within the peripheral right frontal lobe (image  17; series 2) there is a small area of hypodensity, age indeterminate. Orbits are unremarkable. Paranasal sinuses are well aerated. Mastoid air cells unremarkable. Calvarium is intact. 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 IMPRESSION: There is a subacute left basal ganglia infarct involving the caudate head with associated petechial hemorrhages. There is mass effect on the frontal horn left lateral ventricle. Suggestion of hypodensity, age indeterminate, within the peripheral right frontal lobe raising the possibility of an age-indeterminate infarct versus potentially volume averaging of adjacent sulci. Chronic microvascular ischemic changes. Critical Value/emergent results were called by telephone at the time of interpretation on 06/23/2015 at 7:36 pm to Dr. Charlotte Crumb , who verbally acknowledged these results. Electronically Signed   By: Lovey Newcomer M.D.   On: 06/23/2015 19:42   Mr Jodene Nam Head Wo Contrast  06/24/2015  CLINICAL DATA:  Stroke EXAM: MRI HEAD WITHOUT CONTRAST MRA HEAD WITHOUT CONTRAST TECHNIQUE: Multiplanar, multiecho pulse sequences of the brain and surrounding structures were obtained without intravenous contrast. Angiographic images of the head were obtained using MRA technique without contrast. COMPARISON:  CT head 06/23/2015 FINDINGS: MRI HEAD FINDINGS Acute infarct in the left basal ganglia involving the putamen and caudate. There is petechial hemorrhage within the putamen infarct as noted on CT. Mild local mass-effect on the lateral ventricle without midline shift. No other acute infarct. Hypodensity right frontal lobe on CT appears to represent volume averaging of a sulcus. Moderate chronic microvascular ischemic changes throughout the white matter. Brainstem and cerebellum intact. Cerebral volume normal for age.  Ventricles not enlarged. Negative for mass lesion. Paranasal sinuses clear. No orbital mass. Pituitary normal in size. MRA HEAD FINDINGS Both vertebral arteries patent to the  basilar. Left vertebral dominant. PICA patent bilaterally. Mild stenosis distal right vertebral artery. Superior cerebellar and posterior cerebral arteries patent bilaterally Internal carotid artery patent bilaterally without significant stenosis or aneurysm. Anterior and middle cerebral arteries patent bilaterally without significant stenosis.  Negative for cerebral aneurysm. IMPRESSION: Acute infarct in the left putamen and caudate with petechial hemorrhage. Mild mass-effect without local midline shift. Findings are similar to the recent CT. No other acute infarct Moderate chronic microvascular ischemic change in the white matter Negative MRA circle of  Willis. Electronically Signed   By: Franchot Gallo M.D.   On: 06/24/2015 14:24   Mr Brain Wo Contrast  06/24/2015  CLINICAL DATA:  Stroke EXAM: MRI HEAD WITHOUT CONTRAST MRA HEAD WITHOUT CONTRAST TECHNIQUE: Multiplanar, multiecho pulse sequences of the brain and surrounding structures were obtained without intravenous contrast. Angiographic images of the head were obtained using MRA technique without contrast. COMPARISON:  CT head 06/23/2015 FINDINGS: MRI HEAD FINDINGS Acute infarct in the left basal ganglia involving the putamen and caudate. There is petechial hemorrhage within the putamen infarct as noted on CT. Mild local mass-effect on the lateral ventricle without midline shift. No other acute infarct. Hypodensity right frontal lobe on CT appears to represent volume averaging of a sulcus. Moderate chronic microvascular ischemic changes throughout the white matter. Brainstem and cerebellum intact. Cerebral volume normal for age.  Ventricles not enlarged. Negative for mass lesion. Paranasal sinuses clear. No orbital mass. Pituitary normal in size. MRA HEAD FINDINGS Both vertebral arteries patent to the basilar. Left vertebral dominant. PICA patent bilaterally. Mild stenosis distal right vertebral artery. Superior cerebellar and posterior cerebral arteries  patent bilaterally Internal carotid artery patent bilaterally without significant stenosis or aneurysm. Anterior and middle cerebral arteries patent bilaterally without significant stenosis. Negative for cerebral aneurysm. IMPRESSION: Acute infarct in the left putamen and caudate with petechial hemorrhage. Mild mass-effect without local midline shift. Findings are similar to the recent CT. No other acute infarct Moderate chronic microvascular ischemic change in the white matter Negative MRA circle of  Willis. Electronically Signed   By: Franchot Gallo M.D.   On: 06/24/2015 14:24   US Carotid Bilateral  06/24/2015  CLINICAL DATA:  65 year old male with acute left basal ganglia infarct EXAM: BILATERAL CAROTID DUPLEX ULTRASOUND TECHNIQUE: Pearline Cables scale imaging, color Doppler and duplex ultrasound were performed of bilateral carotid and vertebral arteries in the neck. COMPARISON:  Brain MRI 06/24/2015 FINDINGS: Criteria: Quantification of carotid stenosis is based on velocity parameters that correlate the residual internal carotid diameter with NASCET-based stenosis levels, using the diameter of the distal internal carotid lumen as the denominator for stenosis measurement. The following velocity measurements were obtained: RIGHT ICA:  52/11 cm/sec CCA:  [id number] cm/sec SYSTOLIC ICA/CCA RATIO:  0.3 DIASTOLIC ICA/CCA RATIO:  0.9 ECA:  117 cm/sec LEFT ICA:  99/18 cm/sec CCA:  [id number] cm/sec SYSTOLIC ICA/CCA RATIO:  0.5 DIASTOLIC ICA/CCA RATIO:  1.3 125 ECA:   cm/sec RIGHT CAROTID ARTERY: Trace focal heterogeneous atherosclerotic plaque in the proximal internal carotid artery. By peak systolic velocity criteria the estimated stenosis remains less than 50%. RIGHT VERTEBRAL ARTERY:  Patent with normal antegrade flow. LEFT CAROTID ARTERY: Tortuous anatomy. No focal atherosclerotic plaque or evidence of stenosis. LEFT VERTEBRAL ARTERY:  Patent with normal antegrade flow. IMPRESSION: 1. Mild (1-49%) stenosis proximal right internal  carotid artery secondary to heterogenous atherosclerotic plaque. 2. No significant focal plaque or stenosis on the left. 3. Vertebral arteries are patent with normal antegrade flow. Signed, Criselda Peaches, MD [facility] Electronically Signed   By: Jacqulynn Cadet M.D.   On: 06/24/2015 13:43    EKG:  Orders placed or performed during the hospital encounter of 06/23/15  . EKG 12-Lead  .  EKG 12-Lead  . ED EKG  . ED EKG    ASSESSMENT AND PLAN:  Active Problems:   CVA (cerebral infarction)   Diabetes (HCC)   HTN (hypertension) #1. Subacute stroke in subcortical region with right-sided weakness, slurred speech, continue patient on aspirin, Lipitor, get occupational therapist., Physical therapist involved for rehabilitation planning. Patient was seen by neurologist, appreciated recommendations. MRI of brain revealed acute infarct in left putamen  and caudate nucleus with petechial hemorrhage, mild mass effect without midline shift, negative circle of Willis MRA, carotid ultrasound showed mild from 1-49% stenosis in proximal right internal carotid artery, no significant stenosis on the left. Echocardiogram with bubble study is pending #2. Malignant Essential hypertension, permissive hypertension, continue current therapy, hydralazine as needed intravenously   #3. Renal insufficiency, unclear acute versus chronic, likely long-standing hypertension related, follow in the morning, seems to be improving with hydralazine, amlodipine #4 diabetes mellitus, continue patient on sliding scale insulin #5. Dysphagia, SLP to evaluate patient, change diet to dysphagia 3 with nectar Thick liquids, follow clinically  Management plans discussed with the patient, family and they are in agreement.   DRUG ALLERGIES: No Known Allergies  CODE STATUS:     Code Status Orders        Start     Ordered   06/24/15 0154  Full code   Continuous      06/24/15 0153    Code Status History    Date Active Date Inactive Code Status Order ID Comments User Context   This patient has a current code status but no historical code status.      TOTAL TIME TAKING CARE OF THIS PATIENT: 40 minutes.  Discussed this patient's family  Israel Wunder M.D on 06/24/2015 at 3:49 PM  Between 7am to 6pm - Pager - [phone]  After 6pm go to [email or web address] - password [email or web address] [facility]  [facility]  Office  [phone]  CC: Primary care physician; No PCP Per Patient

## 2015-06-24 NOTE — Progress Notes (Signed)
Report given to Hiram Gash RN

## 2015-06-24 NOTE — Evaluation (Signed)
Physical Therapy Evaluation Patient Details Name: Christopher Delacruz MRN: [id number] DOB: [birth year] Today's Date: 06/24/2015   History of Present Illness  65 y/o male who was found on the foor with L sided weakness and AMS.    Clinical Impression  Pt showed great effort with all requested PT acts and was eager to do as much as he could on his own.  Pt was clearly frustrated with inability to put socks on, control R UE and generally seemed to not fully understanding his limitations.  Pt did surprisingly well with transfers and ambulation but despite not having any overt LOBs he displayed some safety issues and unsteadiness.     Follow Up Recommendations SNF (pt would much rather go home, unsafe and would need 24/7)    Equipment Recommendations  Rolling walker with 5" wheels    Recommendations for Other Services       Precautions / Restrictions Precautions Precautions: Fall Restrictions Weight Bearing Restrictions: No      Mobility  Bed Mobility Overal bed mobility: Needs Assistance Bed Mobility: Supine to Sit     Supine to sit: Min assist     General bed mobility comments: Pt highly motivated to get up w/o any assist, he does well and needs only very light assist  Transfers Overall transfer level: Modified independent Equipment used: Rolling walker (2 wheeled)             General transfer comment: Pt is able to get to standing w/o direct assist, he needs heavy use of UEs and cuing/set up to rise  Ambulation/Gait Ambulation/Gait assistance: Min assist Ambulation Distance (Feet): 30 Feet Assistive device: Rolling walker (2 wheeled)       General Gait Details: Pt does surprisingly well with ambulation using walker and though he has some unsteadiness he ultimately did not have any overt LOBs ro signficant stagger steps  Stairs            Wheelchair Mobility    Modified Rankin (Stroke Patients Only)       Balance Overall balance assessment:  (good static  standing balance with walker, decreased dynamic )                                           Pertinent Vitals/Pain Pain Assessment: No/denies pain    Home Living Family/patient expects to be discharged to:: Skilled nursing facility Living Arrangements: Alone Available Help at Discharge: Friend(s)                  Prior Function Level of Independence: Independent         Comments: pt able to take care of errands, ADLs, etc w/o issue     Hand Dominance        Extremity/Trunk Assessment   Upper Extremity Assessment: LUE deficits/detail       LUE Deficits / Details: Pt with significant coordination issues and decreased quality of motion, strength grossly 3/5   Lower Extremity Assessment: LLE deficits/detail   LLE Deficits / Details: grossly 4-/5, decreased quality of motion     Communication   Communication: Expressive difficulties  Cognition Arousal/Alertness: Awake/alert Behavior During Therapy: Restless Overall Cognitive Status: Difficult to assess                      General Comments      Exercises  Assessment/Plan    PT Assessment Patient needs continued PT services  PT Diagnosis Difficulty walking;Generalized weakness;Hemiplegia dominant side   PT Problem List Decreased strength;Decreased range of motion;Decreased activity tolerance;Decreased balance;Decreased mobility;Decreased coordination;Decreased cognition;Decreased knowledge of use of DME;Decreased safety awareness  PT Treatment Interventions Gait training;DME instruction;Functional mobility training;Therapeutic activities;Therapeutic exercise;Balance training;Neuromuscular re-education;Stair training   PT Goals (Current goals can be found in the Care Plan section) Acute Rehab PT Goals Patient Stated Goal: go home PT Goal Formulation: With patient Time For Goal Achievement: 07/08/15 Potential to Achieve Goals: Fair    Frequency 7X/week   Barriers to  discharge        Co-evaluation               End of Session Equipment Utilized During Treatment: Gait belt Activity Tolerance: Patient tolerated treatment well Patient left: with bed alarm set;with call bell/phone within reach           Time: 1645-1714 PT Time Calculation (min) (ACUTE ONLY): 29 min   Charges:   PT Evaluation $PT Eval Moderate Complexity: 1 Procedure     PT G CodesKreg Shropshire, DPT 06/24/2015, 5:45 PM

## 2015-06-24 NOTE — Consult Note (Signed)
PULMONARY / CRITICAL CARE MEDICINE   Name: Christopher Delacruz MRN: [id number] DOB: Jun 04, 1950    ADMISSION DATE:  06/23/2015   CONSULTATION DATE:  06/24/2015  REFERRING MD:  Hospitalist  CHIEF COMPLAINT:  Acute change in mental status  HISTORY OF PRESENT ILLNESS: This is a 65 year old African-American male with a past medical history of hypertension, hyperlipidemia and type 2 diabetes who presents to the ED via EMS for acute change in level of consciousness. History is limited due to patient's confusion. Most of the history is obtained from ED, and EMS records. Per ED records, EMS was called to patient's home by his friends who were concerned because patient could not open the door for them for about 45 minutes. He kept saying he was coming to the door, but never came hence EMS was called. Alongside the fire department. Patient was found on the floor naked, confused with slurred speech and hypoxic with oxygen saturation of 89% on room air. He was placed on oxygen and transferred to the emergency room. At the ED, CT head revealed a subacute acute left basal ganglia infarct with hemorrhagic conversion and mass effect on the frontal horn left lateral ventricle. The last time patient was seen normal was Thursday 06/21/15. Given the hemorrhagic conversion alongside the uncertain duration of onset of symptoms, he was deemed to  not  Be a good candidate for any intervention by the neuro intensivist. However, he was started on mannitol for cerebral edema. PCM was consulted for further management. Patient is awake and following commands. His speech is still garbled and he has significant memory lapses. He is currently on room air with a normal oxygen saturation level. Patient is unable to relay any precipitating events.  PAST MEDICAL HISTORY :  He  has a past medical history of Hypertension; Hyperlipidemia; and Diabetes mellitus ([place]).  PAST SURGICAL HISTORY: He  has no past surgical history on file.  No Known  Allergies  No current facility-administered medications on file prior to encounter.   No current outpatient prescriptions on file prior to encounter.    FAMILY HISTORY:  His has no family status information on file.   SOCIAL HISTORY: He  reports that he has never smoked. He does not have any smokeless tobacco history on file.  REVIEW OF SYSTEMS:   Constitutional: Negative for fever and chills.  HENT: Negative for congestion and rhinorrhea.  Eyes: Negative for redness and visual disturbance.  Respiratory: Negative for shortness of breath and wheezing.  Cardiovascular: Negative for chest pain and palpitations.  Gastrointestinal: Negative  for nausea , vomiting and abdominal pain and loose stools Genitourinary: Negative for dysuria and urgency.  Musculoskeletal: Negative for myalgias and arthralgias.  Skin: Negative for pallor and wound.  Neurological: Negative for dizziness and headaches   SUBJECTIVE:   VITAL SIGNS: BP 169/89 mmHg  Pulse 55  Temp(Src) 98.5 F (36.9 C) (Oral)  Resp 19  Ht 6\' 7"  (2.007 m)  Wt 257 lb 8 oz (116.8 kg)  BMI 29.00 kg/m2  SpO2 92%  HEMODYNAMICS:    VENTILATOR SETTINGS:    INTAKE / OUTPUT:    PHYSICAL EXAMINATION: General: No acute distress Neuro:  Alert and oriented to person only, speech is slurred, mild expressive aphasia, grip strength, diminished in the right compared to the left, mild right arm drift, mild right facial droop, tongue protrudes in midline, recall 2/3, gag reflex intact HEENT: Normocephalic and atraumatic, PERRLA, oral mucosa moist and pink, trachea midline Cardiovascular: Rate and rhythm regular, S1, S2,  no murmur, regurg or gallop Lungs:  Lungs are clear to auscultation bilaterally Abdomen: Abdomen is soft, nontender with normal bowel sounds Musculoskeletal: Limited range of motion and right knee, no visible deformities . Extremities: +2 pulses, no edema Skin:  Warm and dry  LABS:  BMET  Recent Labs Lab  06/23/15 1849 06/24/15 0339  NA 142 143  K 3.4* 3.5  CL 106 109  CO2 26 27  BUN 19 16  CREATININE 1.53* 1.36*  GLUCOSE 137* 114*    Electrolytes  Recent Labs Lab 06/23/15 1849 06/24/15 0339  CALCIUM 9.3 9.0    CBC  Recent Labs Lab 06/23/15 1849 06/24/15 0339  WBC 11.6* 10.0  HGB 13.9 13.7  HCT 43.7 42.2  PLT 215 180    Coag's  Recent Labs Lab 06/23/15 1849  INR 1.09    Sepsis Markers No results for input(s): LATICACIDVEN, PROCALCITON, O2SATVEN in the last 168 hours.  ABG No results for input(s): PHART, PCO2ART, PO2ART in the last 168 hours.  Liver Enzymes  Recent Labs Lab 06/23/15 1849  AST 43*  ALT 16*  ALKPHOS 80  BILITOT 0.9  ALBUMIN 4.0    Cardiac Enzymes  Recent Labs Lab 06/23/15 1849  TROPONINI 0.03    Glucose  Recent Labs Lab 06/24/15 0144  GLUCAP 105*    Imaging Dg Chest 2 View  06/23/2015  CLINICAL DATA:  Patient with weakness, found naked on the floor. EXAM: CHEST  2 VIEW COMPARISON:  Chest radiograph 02/06/2011. FINDINGS: Stable cardiac and mediastinal contours. Low lung volumes. Mild basilar airspace opacities. No pleural effusion or pneumothorax. Mid thoracic spine degenerative changes. IMPRESSION: Low lung volumes with basilar atelectasis. Electronically Signed   By: Lovey Newcomer M.D.   On: 06/23/2015 19:22   Ct Head Wo Contrast  06/23/2015  CLINICAL DATA:  Patient with confusion found naked on the floor. EXAM: CT HEAD WITHOUT CONTRAST TECHNIQUE: Contiguous axial images were obtained from the base of the skull through the vertex without intravenous contrast. COMPARISON:  None. FINDINGS: Ventricles and sulci are appropriate for patient's age. There is a large area (3 cm) of hypodensity within the left basal ganglia extending to the left caudate head. There is central increased attenuation (image 14; series 2). Extensive periventricular and subcortical white matter hypodensity. There is mass effect on the frontal horn of left  lateral ventricle. Within the peripheral right frontal lobe (image 17; series 2) there is a small area of hypodensity, age indeterminate. Orbits are unremarkable. Paranasal sinuses are well aerated. Mastoid air cells unremarkable. Calvarium is intact. 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 IMPRESSION: There is a subacute left basal ganglia infarct involving the caudate head with associated petechial hemorrhages. There is mass effect on the frontal horn left lateral ventricle. Suggestion of hypodensity, age indeterminate, within the peripheral right frontal lobe raising the possibility of an age-indeterminate infarct versus potentially volume averaging of adjacent sulci. Chronic microvascular ischemic changes. Critical Value/emergent results were called by telephone at the time of interpretation on 06/23/2015 at 7:36 pm to Dr. Charlotte Crumb , who verbally acknowledged these results. Electronically Signed   By: Lovey Newcomer M.D.   On: 06/23/2015 19:42    STUDIES:  2-D echo with bubble study-pending EEG-pending  CULTURES: MRSA screen-pending  ANTIBIOTICS: None  SIGNIFICANT EVENTS: 06/23/2015: ED with acute change in mental status, diagnosed with acute CVA, admitted with CVA with hemorrhagic conversion and cerebral edema  LINES/TUBES: Peripheral IVs  DISCUSSION: 65 year old African-American male presenting with acute CVA involving the  left basal basal ganglia with hemorrhagic conversion and mass effect involving the left lateral ventricle. He is currently hemodynamically stable but manifests mild to moderate dysarthria and memory lapses  ASSESSMENT / PLAN:  PULMONARY A: Hypoxia-resolved P:   -When necessary supplemental oxygen  CARDIOVASCULAR A:  History of hypertension History of hyperlipidemia P:  -Full dose statin -Optimize blood pressure control to ensure cerebral perfusion-keep systolic blood pressure [id number]. -When necessary hydralazine for systolic blood pressure greater  than 170. -Hemodynamics per ICU protocol  RENAL A:   AKI-likely secondary to volume depletion, creatinine improving with IV fluids P:   -Gentle IV hydration. -Monitor and replace electrolytes -Renally dose medications. -Trend Creatinine  GASTROINTESTINAL A:   High risk for dysphagia secondary to CVA P:   -Keep nothing by mouth until swallow eval is completed  ENDOCRINE A:   Type 2 diabetes P:   -Monitor blood glucose. -Sliding-scale insulin per protocol -Hemoglobin A1c level -Hold oral hypoglycemic agents until oral intake is resumed  NEUROLOGIC A:   Acute CVA with hemorrhagic conversion Cerebral edema P:   RASS goal: N/A -seizure precautions. -Keppra for seizure prophylaxis -Neurology and neuro intensivist consulted, follow up recommendations -optimize blood pressure control, lipid and glycemic control -MRI/MRA head and neck pending -Carotid ultrasound -2-D echo with bubble study -Manitol with urine osmolarity per neuro intensivist   Disposition and family update: No family at bedside  Best Practice: Code Status:  Full. Diet: npo until swallow eval is done GI prophylaxis:  PPI. VTE prophylaxis:  SCD's / None due to cerebral hemorrhage    [facility] ANP-BC Pulmonary and Critical Care Medicine [facility] Pager [phone] or [phone]  06/24/2015, 4:26 AM

## 2015-06-25 ENCOUNTER — Inpatient Hospital Stay
Admit: 2015-06-25 | Discharge: 2015-06-25 | Disposition: A | Discharge status: Home / Self Care | Payer: Medicare Other | Attending: Adult Health | Admitting: Adult Health

## 2015-06-25 DIAGNOSIS — I639 Cerebral infarction, unspecified: Secondary | ICD-10-CM | POA: Diagnosis not present

## 2015-06-25 LAB — GLUCOSE, CAPILLARY
Glucose-Capillary: 102 mg/dL — ABNORMAL HIGH (ref 65–99)
Glucose-Capillary: 160 mg/dL — ABNORMAL HIGH (ref 65–99)
Glucose-Capillary: 129 mg/dL — ABNORMAL HIGH (ref 65–99)
Glucose-Capillary: 150 mg/dL — ABNORMAL HIGH (ref 65–99)
Glucose-Capillary: 137 mg/dL — ABNORMAL HIGH (ref 65–99)

## 2015-06-25 LAB — BASIC METABOLIC PANEL
Anion gap: 5 (ref 5–15)
BUN: 15 mg/dL (ref 6–20)
CO2: 29 mmol/L (ref 22–32)
Calcium: 8.9 mg/dL (ref 8.9–10.3)
Chloride: 107 mmol/L (ref 101–111)
Creatinine, Ser: 1.19 mg/dL (ref 0.61–1.24)
GFR calc Af Amer: >60 mL/min (ref >60)
GFR calc non Af Amer: >60 mL/min (ref >60)
Glucose, Bld: 108 mg/dL — ABNORMAL HIGH (ref 65–99)
Potassium: 3.4 mmol/L — ABNORMAL LOW (ref 3.5–5.1)
Sodium: 141 mmol/L (ref 135–145)

## 2015-06-25 LAB — MAGNESIUM: Magnesium: 2 mg/dL (ref 1.7–2.4)

## 2015-06-25 LAB — ECHOCARDIOGRAM COMPLETE BUBBLE STUDY
Height: 79 in
Weight: 4119.96 oz

## 2015-06-25 MED ORDER — LISINOPRIL 20 MG PO TABS
20.0000 mg | ORAL_TABLET | Freq: Two times a day (BID) | ORAL | Status: DC
  Administered 2015-06-25 – 2015-06-27 (×5): 20 mg via ORAL
  Filled 2015-06-25 (×4): qty 1

## 2015-06-25 MED ORDER — POTASSIUM CHLORIDE 20 MEQ PO PACK
40.0000 meq | PACK | Freq: Once | ORAL | Status: AC
  Administered 2015-06-25: 40 meq via ORAL
  Filled 2015-06-25: qty 2

## 2015-06-25 MED ORDER — ASPIRIN 81 MG PO CHEW
81.0000 mg | CHEWABLE_TABLET | Freq: Every day | ORAL | Status: DC
  Administered 2015-06-25 – 2015-06-27 (×3): 81 mg via ORAL
  Filled 2015-06-25 (×3): qty 1

## 2015-06-25 NOTE — Evaluation (Signed)
Clinical/Bedside Swallow Evaluation Patient Details  Name: Christopher Delacruz MRN: [id number] Date of Birth: 1950-07-14  Today's Date: 06/25/2015 Time: SLP Start Time (ACUTE ONLY): 1030 SLP Stop Time (ACUTE ONLY): 1130 SLP Time Calculation (min) (ACUTE ONLY): 60 min  Past Medical History:  Past Medical History  Diagnosis Date  . Hypertension   . Hyperlipidemia   . Diabetes mellitus ([place])    Past Surgical History: History reviewed. No pertinent past surgical history. HPI:  Pt is a 65 year old African-American male with past medical history significant for diabetes, essential hypertension, who presents to the hospital with complaints of right weakness, disorientation, slurred speech, difficulty expressing himself. On arrival to the hospital, patient had a CT scan of head done revealing subacute left basilar ganglia infarct; there was also mass effect on the frontal horn of left ventricle. Pt c/o R U/L extremity weakness at admission; some facial weakness and difficulty speaking.at admission. During assessment today, pt stated the facial weakness has improved; speech is at his baseline per pt and friend in room - though articulation of speech is reduced (baseline for pt?). Pt did exhibit hesitation and apparent word retrieval/recall during direct questions/conversation of naming. Followed all commands/instructions. Recommend f/u w/ further assessment of such. Pt denied any difficutly swallowing; tolerating current dysphagia diet per pt and NSG.    Assessment / Plan / Recommendation Clinical Impression  Pt appeared to adequately tolerate trials of thin liquids and purees/soft solids w/ no overt s/s of aspiration noted; no decline in respiratory status - vocal quality was clear b/t trials when assessed. Oral phase was wfl w/ the bolus trials given - softened solids for easier mastication at this time. Pt assisted in feeding self but required assistance sec. to the R UE weakness (R hand dominant). Due to pt's  presentation at this time post CVA, pt appears at reduced risk for aspiration following general aspiration precautions - including poistioning/sitting fully upright for po intake. Pt instructed on aspiration precautions including small, single sips; eating and drinking slowly. Pt gave verbal acknowledgement and followed through w/ strategies himself. Recommend a Dys. 3 diet consistency w/ thin liquids; aspiration precautions; meds in Puree - whole if needed. Tray setup and positioning upright at all meals d/t RUE weakness. Pt would benefit from f/u w/ Cognitive-Linguistic assessment sec. to apparent word retrieval/recall deficits. NSG updated.     Aspiration Risk   (reduced)    Diet Recommendation  Dysphagia 3(easier management d/t RUE weakness), thin liquids; general aspiration precautions; tray setup and positioning at meals d/t R sided weakness.   Medication Administration: Whole meds with liquid (puree if needed)    Other  Recommendations Recommended Consults:  (TBD) Oral Care Recommendations: Oral care BID;Staff/trained caregiver to provide oral care   Follow up Recommendations  Skilled Nursing facility;Inpatient Rehab (TBD)    Frequency and Duration min 2x/week  1 week (for dysphagia f/u)       Prognosis Prognosis for Safe Diet Advancement: Good      Swallow Study   General Date of Onset: 06/23/15 HPI: Pt is a 65 year old African-American male with past medical history significant for diabetes, essential hypertension, who presents to the hospital with complaints of right weakness, disorientation, slurred speech, difficulty expressing himself. On arrival to the hospital, patient had a CT scan of head done revealing subacute left basilar ganglia infarct; there was also mass effect on the frontal horn of left ventricle. Pt c/o R U/L extremity weakness at admission; some facial weakness and difficulty speaking.at admission. During assessment today,  pt stated the facial weakness has  improved; speech is at his baseline per pt and friend in room - though articulation of speech is reduced (baseline for pt?). Pt did exhibit hesitation and apparent word retrieval/recall during direct questions/conversation of naming. Followed all commands/instructions. Recommend f/u w/ further assessment of such. Pt denied any difficutly swallowing; tolerating current dysphagia diet per pt and NSG.  Type of Study: Bedside Swallow Evaluation Previous Swallow Assessment: none Diet Prior to this Study: Dysphagia 3 (soft);Nectar-thick liquids (per MD at admission; regular diet at home per pt) Temperature Spikes Noted: No (wbc 10.0) Respiratory Status: Room air (O2 just removed by NSG) History of Recent Intubation: No Behavior/Cognition: Alert;Cooperative;Pleasant mood Oral Cavity Assessment: Within Functional Limits Oral Care Completed by SLP: Recent completion by staff Oral Cavity - Dentition: Adequate natural dentition Vision: Functional for self-feeding (weak R UE) Self-Feeding Abilities: Needs assist;Needs set up Patient Positioning: Upright in bed Baseline Vocal Quality: Normal;Low vocal intensity Volitional Cough: Strong Volitional Swallow: Able to elicit    Oral/Motor/Sensory Function Overall Oral Motor/Sensory Function: Within functional limits (no weaknes noted)   Ice Chips Ice chips: Within functional limits Presentation: Spoon (fed; 3 trials)   Thin Liquid Thin Liquid: Within functional limits Presentation: Cup;Self Fed;Straw (assisted; ~4 ozs total)    Nectar Thick Nectar Thick Liquid: Not tested   Honey Thick Honey Thick Liquid: Not tested   Puree Puree: Within functional limits Presentation: Spoon;Self Fed (assisted; 3 trials)   Solid   GO   Solid: Within functional limits (softened, broken down; 3 trials) Presentation:  (fed)        Christopher Kenner, MS, CCC-SLP  Delacruz,Christopher 06/25/2015,11:49 AM

## 2015-06-25 NOTE — Clinical Social Work Placement (Signed)
   CLINICAL SOCIAL WORK PLACEMENT  NOTE  Date:  06/25/2015  Patient Details  Name: Christopher Delacruz MRN: [id number] Date of Birth: [birth year]  Clinical Social Work is seeking post-discharge placement for this patient at the [place] level of care (*CSW will initial, date and re-position this form in  chart as items are completed):  Yes   Patient/family provided with [facility] Work Department's list of facilities offering this level of care within the geographic area requested by the patient (or if unable, by the patient's family).  Yes   Patient/family informed of their freedom to choose among providers that offer the needed level of care, that participate in Medicare, Medicaid or managed care program needed by the patient, have an available bed and are willing to accept the patient.  Yes   Patient/family informed of [facility]'s ownership interest in [facility] and [facility], as well as of the fact that they are under no obligation to receive care at these facilities.  PASRR submitted to EDS on 06/25/15     PASRR number received on 06/25/15     Existing PASRR number confirmed on       FL2 transmitted to all facilities in geographic area requested by pt/family on 06/25/15     FL2 transmitted to all facilities within larger geographic area on       Patient informed that his/her managed care company has contracts with or will negotiate with certain facilities, including the following:            Patient/family informed of bed offers received.  Patient chooses bed at       Physician recommends and patient chooses bed at      Patient to be transferred to   on  .  Patient to be transferred to facility by       Patient family notified on   of transfer.  Name of family member notified:        PHYSICIAN       Additional Comment:    _______________________________________________ Loralyn Freshwater, LCSW 06/25/2015, 2:34 PM

## 2015-06-25 NOTE — Progress Notes (Signed)
[facility] [place] NAME: Christopher Delacruz    MR#:  [id number]  DATE OF BIRTH:  November 04, 1950  SUBJECTIVE:  CHIEF COMPLAINT:   Chief Complaint  Patient presents with  . Altered Mental Status  The patient is a 65 year old African-American male with past medical history significant for diabetes, essential hypertension, who presents to the hospital with complaints of right weakness, disorientation, slurred speech, difficult to express himself. On arrival to the hospital patient had a CT scan of head done revealing subacute left basilar ganglia infarct involving cortex Haight had and petechial hemorrhages, there was also mass effect on the frontal horn of left ventricle, patient was initiated on mannitol and the admitted to intensive care unit. He was seen by neurologist, mantle was discontinued and patient was transferred to regular medical floor. Patient complains of right-sided weakness, right lower extremity more than right upper extremity, some facial weakness, difficulty speaking. No significant changes today, patient feels comfortable. Patient admits of having significant weakness in the right lower extremity, he participated in physical therapy yesterday, skilled nursing facility placement was recommended. Review of Systems  Constitutional: Negative for fever, chills and weight loss.  HENT: Negative for congestion.   Eyes: Negative for blurred vision and double vision.  Respiratory: Negative for cough, sputum production, shortness of breath and wheezing.   Cardiovascular: Negative for chest pain, palpitations, orthopnea, leg swelling and PND.  Gastrointestinal: Negative for nausea, vomiting, abdominal pain, diarrhea, constipation and blood in stool.  Genitourinary: Negative for dysuria, urgency, frequency and hematuria.  Musculoskeletal: Negative for falls.  Neurological: Positive for focal weakness. Negative for dizziness, tremors and headaches.   Endo/Heme/Allergies: Does not bruise/bleed easily.  Psychiatric/Behavioral: Negative for depression. The patient does not have insomnia.     VITAL SIGNS: Blood pressure 135/76, pulse 58, temperature 98.3 F (36.8 C), temperature source Oral, resp. rate 18, height 6\' 7"  (2.007 m), weight 116.8 kg (257 lb 8 oz), SpO2 95 %.  PHYSICAL EXAMINATION:   GENERAL:  65 y.o.-year-old patient lying in the bed with no acute distress. Poor eye contact, slurring speech, has difficulty expressing himself, mostly answers questions yes and no, appropriately EYES: Pupils equal, round, reactive to light and accommodation. No scleral icterus. Extraocular muscles intact.  HEENT: Head atraumatic, normocephalic. Oropharynx and nasopharynx clear.  NECK:  Supple, no jugular venous distention. No thyroid enlargement, no tenderness.  LUNGS: Normal breath sounds bilaterally, no wheezing, rales,rhonchi or crepitation. No use of accessory muscles of respiration.  CARDIOVASCULAR: S1, S2 normal. No murmurs, rubs, or gallops.  ABDOMEN: Soft, nontender, nondistended. Bowel sounds present. No organomegaly or mass.  EXTREMITIES: No pedal edema, cyanosis, or clubbing.  NEUROLOGIC: Cranial nerves II through XII , mild right-sided weakness, forehead is.Marland Kitchen spared. Muscle strength 2 -3/5 in  right lower extremity, 4/5 right upper extremity, 5 out of 5 in the left-sided extremities. Sensation intact. Gait not checked.  PSYCHIATRIC: The patient is alert and oriented x 3.  SKIN: No obvious rash, lesion, or ulcer.   ORDERS/RESULTS REVIEWED:   CBC  Recent Labs Lab 06/23/15 1849 06/24/15 0339  WBC 11.6* 10.0  HGB 13.9 13.7  HCT 43.7 42.2  PLT 215 180  MCV 86.0 86.0  MCH 27.3 27.9  MCHC 31.8* 32.5  RDW 13.8 13.7  LYMPHSABS 2.0  --   MONOABS 0.9  --   EOSABS 0.2  --   BASOSABS 0.1  --    ------------------------------------------------------------------------------------------------------------------  Chemistries    Recent Labs Lab 06/23/15  1849 06/24/15 0339 06/25/15 0520  NA 142 143 141  K 3.4* 3.5 3.4*  CL 106 109 107  CO2 26 27 29   GLUCOSE 137* 114* 108*  BUN 19 16 15   CREATININE 1.53* 1.36* 1.19  CALCIUM 9.3 9.0 8.9  AST 43*  --   --   ALT 16*  --   --   ALKPHOS 80  --   --   BILITOT 0.9  --   --    ------------------------------------------------------------------------------------------------------------------ estimated creatinine clearance is 88.9 mL/min (by C-G formula based on Cr of 1.19). ------------------------------------------------------------------------------------------------------------------ No results for input(s): TSH, T4TOTAL, T3FREE, THYROIDAB in the last 72 hours.  Invalid input(s): FREET3  Cardiac Enzymes  Recent Labs Lab 06/23/15 1849  TROPONINI 0.03   ------------------------------------------------------------------------------------------------------------------ Invalid input(s): POCBNP ---------------------------------------------------------------------------------------------------------------  RADIOLOGY: Dg Chest 2 View  06/23/2015  CLINICAL DATA:  Patient with weakness, found naked on the floor. EXAM: CHEST  2 VIEW COMPARISON:  Chest radiograph 02/06/2011. FINDINGS: Stable cardiac and mediastinal contours. Low lung volumes. Mild basilar airspace opacities. No pleural effusion or pneumothorax. Mid thoracic spine degenerative changes. IMPRESSION: Low lung volumes with basilar atelectasis. Electronically Signed   By: Lovey Newcomer M.D.   On: 06/23/2015 19:22   Ct Head Wo Contrast  06/23/2015  CLINICAL DATA:  Patient with confusion found naked on the floor. EXAM: CT HEAD WITHOUT CONTRAST TECHNIQUE: Contiguous axial images were obtained from the base of the skull through the vertex without intravenous contrast. COMPARISON:  None. FINDINGS: Ventricles and sulci are appropriate for patient's age. There is a large area (3 cm) of hypodensity within the left  basal ganglia extending to the left caudate head. There is central increased attenuation (image 14; series 2). Extensive periventricular and subcortical white matter hypodensity. There is mass effect on the frontal horn of left lateral ventricle. Within the peripheral right frontal lobe (image 17; series 2) there is a small area of hypodensity, age indeterminate. Orbits are unremarkable. Paranasal sinuses are well aerated. Mastoid air cells unremarkable. Calvarium is intact. 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 IMPRESSION: There is a subacute left basal ganglia infarct involving the caudate head with associated petechial hemorrhages. There is mass effect on the frontal horn left lateral ventricle. Suggestion of hypodensity, age indeterminate, within the peripheral right frontal lobe raising the possibility of an age-indeterminate infarct versus potentially volume averaging of adjacent sulci. Chronic microvascular ischemic changes. Critical Value/emergent results were called by telephone at the time of interpretation on 06/23/2015 at 7:36 pm to Dr. Charlotte Crumb , who verbally acknowledged these results. Electronically Signed   By: Lovey Newcomer M.D.   On: 06/23/2015 19:42   Mr Jodene Nam Head Wo Contrast  06/24/2015  CLINICAL DATA:  Stroke EXAM: MRI HEAD WITHOUT CONTRAST MRA HEAD WITHOUT CONTRAST TECHNIQUE: Multiplanar, multiecho pulse sequences of the brain and surrounding structures were obtained without intravenous contrast. Angiographic images of the head were obtained using MRA technique without contrast. COMPARISON:  CT head 06/23/2015 FINDINGS: MRI HEAD FINDINGS Acute infarct in the left basal ganglia involving the putamen and caudate. There is petechial hemorrhage within the putamen infarct as noted on CT. Mild local mass-effect on the lateral ventricle without midline shift. No other acute infarct. Hypodensity right frontal lobe on CT appears to represent volume averaging of a sulcus. Moderate chronic  microvascular ischemic changes throughout the white matter. Brainstem and cerebellum intact. Cerebral volume normal for age.  Ventricles not enlarged. Negative for mass lesion. Paranasal sinuses clear. No orbital mass. Pituitary normal in  size. MRA HEAD FINDINGS Both vertebral arteries patent to the basilar. Left vertebral dominant. PICA patent bilaterally. Mild stenosis distal right vertebral artery. Superior cerebellar and posterior cerebral arteries patent bilaterally Internal carotid artery patent bilaterally without significant stenosis or aneurysm. Anterior and middle cerebral arteries patent bilaterally without significant stenosis. Negative for cerebral aneurysm. IMPRESSION: Acute infarct in the left putamen and caudate with petechial hemorrhage. Mild mass-effect without local midline shift. Findings are similar to the recent CT. No other acute infarct Moderate chronic microvascular ischemic change in the white matter Negative MRA circle of  Willis. Electronically Signed   By: Franchot Gallo M.D.   On: 06/24/2015 14:24   Mr Brain Wo Contrast  06/24/2015  CLINICAL DATA:  Stroke EXAM: MRI HEAD WITHOUT CONTRAST MRA HEAD WITHOUT CONTRAST TECHNIQUE: Multiplanar, multiecho pulse sequences of the brain and surrounding structures were obtained without intravenous contrast. Angiographic images of the head were obtained using MRA technique without contrast. COMPARISON:  CT head 06/23/2015 FINDINGS: MRI HEAD FINDINGS Acute infarct in the left basal ganglia involving the putamen and caudate. There is petechial hemorrhage within the putamen infarct as noted on CT. Mild local mass-effect on the lateral ventricle without midline shift. No other acute infarct. Hypodensity right frontal lobe on CT appears to represent volume averaging of a sulcus. Moderate chronic microvascular ischemic changes throughout the white matter. Brainstem and cerebellum intact. Cerebral volume normal for age.  Ventricles not enlarged. Negative  for mass lesion. Paranasal sinuses clear. No orbital mass. Pituitary normal in size. MRA HEAD FINDINGS Both vertebral arteries patent to the basilar. Left vertebral dominant. PICA patent bilaterally. Mild stenosis distal right vertebral artery. Superior cerebellar and posterior cerebral arteries patent bilaterally Internal carotid artery patent bilaterally without significant stenosis or aneurysm. Anterior and middle cerebral arteries patent bilaterally without significant stenosis. Negative for cerebral aneurysm. IMPRESSION: Acute infarct in the left putamen and caudate with petechial hemorrhage. Mild mass-effect without local midline shift. Findings are similar to the recent CT. No other acute infarct Moderate chronic microvascular ischemic change in the white matter Negative MRA circle of  Willis. Electronically Signed   By: Franchot Gallo M.D.   On: 06/24/2015 14:24   US Carotid Bilateral  06/24/2015  CLINICAL DATA:  65 year old male with acute left basal ganglia infarct EXAM: BILATERAL CAROTID DUPLEX ULTRASOUND TECHNIQUE: Pearline Cables scale imaging, color Doppler and duplex ultrasound were performed of bilateral carotid and vertebral arteries in the neck. COMPARISON:  Brain MRI 06/24/2015 FINDINGS: Criteria: Quantification of carotid stenosis is based on velocity parameters that correlate the residual internal carotid diameter with NASCET-based stenosis levels, using the diameter of the distal internal carotid lumen as the denominator for stenosis measurement. The following velocity measurements were obtained: RIGHT ICA:  52/11 cm/sec CCA:  [id number] cm/sec SYSTOLIC ICA/CCA RATIO:  0.3 DIASTOLIC ICA/CCA RATIO:  0.9 ECA:  117 cm/sec LEFT ICA:  99/18 cm/sec CCA:  [id number] cm/sec SYSTOLIC ICA/CCA RATIO:  0.5 DIASTOLIC ICA/CCA RATIO:  1.3 125 ECA:   cm/sec RIGHT CAROTID ARTERY: Trace focal heterogeneous atherosclerotic plaque in the proximal internal carotid artery. By peak systolic velocity criteria the estimated stenosis  remains less than 50%. RIGHT VERTEBRAL ARTERY:  Patent with normal antegrade flow. LEFT CAROTID ARTERY: Tortuous anatomy. No focal atherosclerotic plaque or evidence of stenosis. LEFT VERTEBRAL ARTERY:  Patent with normal antegrade flow. IMPRESSION: 1. Mild (1-49%) stenosis proximal right internal carotid artery secondary to heterogenous atherosclerotic plaque. 2. No significant focal plaque or stenosis on the left. 3. Vertebral arteries are patent  with normal antegrade flow. Signed, Criselda Peaches, MD [facility] Electronically Signed   By: Jacqulynn Cadet M.D.   On: 06/24/2015 13:43    EKG:  Orders placed or performed during the hospital encounter of 06/23/15  . EKG 12-Lead  . EKG 12-Lead  . ED EKG  . ED EKG    ASSESSMENT AND PLAN:  Active Problems:   CVA (cerebral infarction)   Diabetes (HCC)   HTN (hypertension) #1. Subacute stroke in subcortical region with right-sided weakness, slurred speech, continue patient on aspirin, Lipitor, Awaiting for occupational therapist input , Physical therapist recommended skilled nursing facility placement. Patient was seen by neurologist, appreciated recommendations. MRI of brain revealed acute infarct in left putamen  and caudate nucleus with petechial hemorrhage, mild mass effect without midline shift, negative circle of Willis MRA, carotid ultrasound showed mild from 1-49% stenosis in proximal right internal carotid artery, no significant stenosis on the left. Echocardiogram with bubble study is pending #2. Malignant Essential hypertension, permissive hypertension, continue current therapy, hydralazine as needed intravenously   #3. Renal insufficiency, acute , possibly   dehydration versus hypertension related,. Resolved  #4 diabetes mellitus,  patient is now on dysphagia diet, oral intake is not documented, blood glucose levels are ranging between 95-150, continue patient on sliding scale  insulin #5. Dysphagia, SLP to evaluate patient, change diet to dysphagia 3 with nectar Thick liquids, follow clinically #6. Hypokalemia, supplement orally, discontinue HCTZ for now, get magnesium level, supplement as needed  #7. Right-sided weakness, patient is being prepared for skilled nursing facility placement, consult social work  Management plans discussed with the patient, family and they are in agreement.   DRUG ALLERGIES: No Known Allergies  CODE STATUS:     Code Status Orders        Start     Ordered   06/24/15 0154  Full code   Continuous     06/24/15 0153    Code Status History    Date Active Date Inactive Code Status Order ID Comments User Context   This patient has a current code status but no historical code status.      TOTAL TIME TAKING CARE OF THIS PATIENT: 30 minutes.    Theodoro Grist M.D on 06/25/2015 at 9:51 AM  Between 7am to 6pm - Pager - [phone]  After 6pm go to [email or web address] - password [email or web address] [facility]  [facility]  Office  [phone]  CC: Primary care physician; No PCP Per Patient

## 2015-06-25 NOTE — Care Management Important Message (Signed)
Important Message  Patient Details  Name: Christopher Delacruz MRN: [id number] Date of Birth: [birth year]   Medicare Important Message Given:  Yes    Juliann Pulse A Kerrigan Glendening 06/25/2015, 11:11 AM

## 2015-06-25 NOTE — NC FL2 (Signed)
[place] LEVEL OF CARE SCREENING TOOL     IDENTIFICATION  Patient Name: Christopher Delacruz Birthdate: Aug 27, 1950 Sex: male Admission Date (Current Location): 06/23/2015  [place] and [place] Number:  Engineering geologist and Address:  [facility], [address] [place], [place] [address]      Provider Number: [id number]  Attending Physician Name and Address:  Theodoro Grist, MD  Relative Name and Phone Number:       Current Level of Care: Hospital Recommended Level of Care: [place] Prior Approval Number:    Date Approved/Denied:   PASRR Number:  ([id number])  Discharge Plan: SNF    Current Diagnoses: Patient Active Problem List   Diagnosis Date Noted  . CVA (cerebral infarction) 06/23/2015  . Diabetes ([place]) 06/23/2015  . HTN (hypertension) 06/23/2015    Orientation RESPIRATION BLADDER Height & Weight     Self, Time, Situation, Place  Normal Continent Weight: 257 lb 8 oz (116.8 kg) Height:  6\' 7"  (200.7 cm)  BEHAVIORAL SYMPTOMS/MOOD NEUROLOGICAL BOWEL NUTRITION STATUS   (none )  (none ) Continent Diet (Diet: DYS 3 )  AMBULATORY STATUS COMMUNICATION OF NEEDS Skin   Extensive Assist Verbally Normal                       Personal Care Assistance Level of Assistance  Bathing, Feeding, Dressing Bathing Assistance: Limited assistance Feeding assistance: Independent Dressing Assistance: Limited assistance     Functional Limitations Info  Sight, Hearing, Speech Sight Info: Adequate Hearing Info: Adequate Speech Info: Adequate    SPECIAL CARE FACTORS FREQUENCY  PT (By licensed PT), OT (By licensed OT), Speech therapy     PT Frequency:  (5) OT Frequency:  (5)     Speech Therapy Frequency:  (2)      Contractures      Additional Factors Info  Code Status, Allergies, Insulin Sliding Scale Code Status Info:  (Full Code. ) Allergies Info:  (No Known Allergies. )   Insulin Sliding Scale Info:   (NovoLog Insulin Injections )       Current Medications (06/25/2015):  This is the current hospital active medication list Current Facility-Administered Medications  Medication Dose Route Frequency Provider Last Rate Last Dose  . acetaminophen (TYLENOL) tablet 650 mg  650 mg Oral Q6H PRN Debby Crosley, MD       Or  . acetaminophen (TYLENOL) suppository 650 mg  650 mg Rectal Q6H PRN Debby Crosley, MD      . amLODipine (NORVASC) tablet 5 mg  5 mg Oral Daily Debby Crosley, MD   5 mg at 06/25/15 1011  . aspirin chewable tablet 81 mg  81 mg Oral Daily Theodoro Grist, MD   81 mg at 06/25/15 1010  . atorvastatin (LIPITOR) tablet 80 mg  80 mg Oral QHS Debby Crosley, MD   80 mg at 06/24/15 2306  . insulin aspart (novoLOG) injection 0-15 Units  0-15 Units Subcutaneous TID WC Quintella Baton, MD   3 Units at 06/25/15 1247  . insulin aspart (novoLOG) injection 0-5 Units  0-5 Units Subcutaneous QHS Quintella Baton, MD   0 Units at 06/24/15 0158  . lisinopril (PRINIVIL,ZESTRIL) tablet 20 mg  20 mg Oral BID Theodoro Grist, MD   20 mg at 06/25/15 1010  . ondansetron (ZOFRAN) tablet 4 mg  4 mg Oral Q6H PRN Debby Crosley, MD       Or  . ondansetron (ZOFRAN) injection 4 mg  4 mg Intravenous  Q6H PRN Quintella Baton, MD      . senna-docusate (Senokot-S) tablet 1 tablet  1 tablet Oral QHS PRN Debby Crosley, MD      . sodium chloride flush (NS) 0.9 % injection 3 mL  3 mL Intravenous Q12H Debby Crosley, MD   3 mL at 06/25/15 1248     Discharge Medications: Please see discharge summary for a list of discharge medications.  Relevant Imaging Results:  Relevant Lab Results:   Additional Information  (SSN: [id number])  Loralyn Freshwater, LCSW

## 2015-06-25 NOTE — Evaluation (Signed)
Occupational Therapy Evaluation Patient Details Name: Varish Grisson MRN: [id number] DOB: [birth year] Today's Date: 06/25/2015    History of Present Illness 65 y/o male who was found on the foor with L sided weakness and AMS.     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 Requires Min A for sit<>stand transfer with 2WW with VC for hand placement, requires VC to swing RLE into bed for sit>sup transfer to bed. Pt lives alone, with possible assistance from family at home. Pt could benefit from skilled OT services to address noted impairments and improve safety and independence with functional mobility and ADL. Recommend SNF.    Follow Up Recommendations  SNF    Equipment Recommendations  None recommended by OT    Recommendations for Other Services       Precautions / Restrictions Precautions Precautions: Fall Restrictions Weight Bearing Restrictions: No      Mobility Bed Mobility Overal bed mobility: Needs Assistance Bed Mobility: Supine to Sit;Sit to Supine     Supine to sit: Supervision Sit to supine: Min guard   General bed mobility comments: Pt motivated to sit EOB w/o assist, needed only supervision, cuing to scoot EOB for bil LE support on floor.  Transfers Overall transfer level: Needs assistance Equipment used: Rolling walker (2 wheeled) Transfers: Sit to/from Stand Sit to Stand: Min assist         General transfer comment: Pt able to stand with Min A after several attempts, verbal cues for hand placement on bed to push up    Balance Overall balance assessment: Needs assistance Sitting-balance support: Single extremity supported;Feet supported Sitting  balance-Leahy Scale: Good   Postural control: Right lateral lean Standing balance support: Bilateral upper extremity supported Standing balance-Leahy Scale: Fair                              ADL Overall ADL's : Needs assistance/impaired     Grooming: Wash/dry hands;Wash/dry face;Set up;Min guard;Standing Grooming Details (indicate cue type and reason): Pt performed grooming tasks standing at sink with Min Guard and LUE support on sink while using RUE to wash face, reach for paper towel             Lower Body Dressing: Maximal assistance;Set up;Cueing for compensatory techniques;Sitting/lateral leans Lower Body Dressing Details (indicate cue type and reason): Pt attempted to don B shoes with set up, able to untie laces, pull tongue of shoe out, but stated "it won't go, I can't" once he attempted to slide foot into shoe. Unclear why difficult. Max A to don.             Functional mobility during ADLs: Minimal assistance;Rolling walker;Cueing for sequencing General ADL Comments: Increased assistance for ADL due to poor awareness, impaired cognition, R side weakness     Vision Vision Assessment?: No apparent visual deficits   Perception Perception Perception Tested?: Yes Perception Deficits: Inattention/neglect Inattention/Neglect: Impaired- to be further tested in functional context Comments: Possible slight neglect of R side, required cuing for RLE to swing fully into bed during sit>supine t/f   Praxis      Pertinent Vitals/Pain Pain Assessment: No/denies pain     Hand Dominance  Extremity/Trunk Assessment Upper Extremity Assessment Upper Extremity Assessment: RUE deficits/detail;Difficult to assess due to impaired cognition RUE Deficits / Details: grossly 4-/5, slight decreased FMC  RUE Coordination: decreased fine motor   Lower Extremity Assessment Lower Extremity Assessment: Defer to PT evaluation;RLE deficits/detail   Cervical / Trunk  Assessment Cervical / Trunk Assessment: Normal   Communication Communication Communication: Expressive difficulties (decreased articulation of speech)   Cognition Arousal/Alertness: Awake/alert Behavior During Therapy: WFL for tasks assessed/performed Overall Cognitive Status: Difficult to assess                     General Comments       Exercises Exercises: General Lower Extremity     Shoulder Instructions      Home Living Family/patient expects to be discharged to:: Skilled nursing facility Living Arrangements: Alone Available Help at Discharge: Friend(s)                                    Prior Functioning/Environment Level of Independence: Independent        Comments: pt able to take care of errands, ADLs, etc w/o issue    OT Diagnosis: Cognitive deficits;Altered mental status;Paresis   OT Problem List: Decreased strength;Impaired balance (sitting and/or standing);Decreased coordination;Decreased cognition;Decreased safety awareness;Decreased knowledge of use of DME or AE;Impaired UE functional use   OT Treatment/Interventions: Self-care/ADL training;Neuromuscular education;Energy conservation;DME and/or AE instruction;Patient/family education;Cognitive remediation/compensation    OT Goals(Current goals can be found in the care plan section) Acute Rehab OT Goals Patient Stated Goal: go home OT Goal Formulation: With patient Time For Goal Achievement: 07/09/15 Potential to Achieve Goals: Good  OT Frequency: Min 1X/week   Barriers to D/C: Decreased caregiver support          Co-evaluation              End of Session Equipment Utilized During Treatment: Gait belt;Rolling walker  Activity Tolerance: Patient tolerated treatment well Patient left: in bed;with call bell/phone within reach;with family/visitor present   Time: 1205-1230 OT Time Calculation (min): 25 min Charges:  OT General Charges $OT Visit: 1 Procedure OT  Evaluation $OT Eval Moderate Complexity: 1 Procedure G-Codes:    Corky Sox, OTR/L 06/25/2015, 1:07 PM

## 2015-06-25 NOTE — Plan of Care (Signed)
Problem: Acute Rehab OT Goals (only OT should resolve) Goal: Pt. Will Perform Lower Body Dressing Outcome: Progressing Pt requires Max A to don shoes, able to untie laces, pull tongue out, unable to slide foot in without assist - difficult to determine physical vs cognitive

## 2015-06-25 NOTE — Evaluation (Signed)
Physical Therapy Evaluation Patient Details Name: Christopher Delacruz MRN: [id number] DOB: October 09, 1950 Today's Date: 06/25/2015   History of Present Illness  65 y/o male who was found on the foor with L sided weakness and AMS.    Clinical Impression  Patient is able to perform bed mobility with mod assist and use of hospital bed for elevation of head. He has right sided  Neglect and RLE neglect. Patient has poor safety awareness for all mobility. He  Ambulates with RW and mod assist .   Follow Up Recommendations SNF    Equipment Recommendations  Rolling walker with 5" wheels    Recommendations for Other Services       Precautions / Restrictions Precautions Precautions: Fall Restrictions Weight Bearing Restrictions: No      Mobility  Bed Mobility Overal bed mobility: Needs Assistance Bed Mobility: Supine to Sit     Supine to sit: Min assist     General bed mobility comments: Pt highly motivated to get up w/o any assist, he does well and needs only very light assist  Transfers Overall transfer level: Modified independent Equipment used: Rolling walker (2 wheeled)             General transfer comment: Pt is able to get to standing w/o direct assist, he needs heavy use of UEs and cuing/set up to rise  Ambulation/Gait Ambulation/Gait assistance: Mod assist Ambulation Distance (Feet): 30 Feet Assistive device: Rolling walker (2 wheeled) Gait Pattern/deviations: Step-to pattern   Gait velocity interpretation: <1.8 ft/sec, indicative of risk for recurrent falls General Gait Details: Patient has RLE leg weakness and neglect on RLE and RUE   Stairs            Wheelchair Mobility    Modified Rankin (Stroke Patients Only)       Balance Overall balance assessment: Needs assistance Sitting-balance support: No upper extremity supported     Postural control: Right lateral lean Standing balance support: Bilateral upper extremity supported                                 Pertinent Vitals/Pain Pain Assessment: No/denies pain    Home Living                        Prior Function                 Hand Dominance        Extremity/Trunk Assessment                         Communication      Cognition Arousal/Alertness: Awake/alert Behavior During Therapy: Restless Overall Cognitive Status: Difficult to assess                      General Comments      Exercises General Exercises - Lower Extremity Ankle Circles/Pumps: Strengthening;Right;Left;10 reps Long Arc Quad: AROM;10 reps Hip ABduction/ADduction: AROM;Strengthening;10 reps Hip Flexion/Marching: AROM;Strengthening;10 reps      Assessment/Plan    PT Assessment    PT Diagnosis     PT Problem List    PT Treatment Interventions     PT Goals (Current goals can be found in the Care Plan section) Acute Rehab PT Goals Patient Stated Goal: go home PT Goal Formulation: With patient Time For Goal Achievement: 07/08/15 Potential to Achieve Goals: Fair    Frequency  7X/week   Barriers to discharge        Co-evaluation               End of Session Equipment Utilized During Treatment: Gait belt Activity Tolerance: Patient tolerated treatment well Patient left: with bed alarm set;with call bell/phone within reach           Time: 1135-1200 PT Time Calculation (min) (ACUTE ONLY): 25 min   Charges:   PT Evaluation $PT Eval Moderate Complexity: 1 Procedure PT Treatments $Gait Training: 8-22 mins $Therapeutic Exercise: 8-22 mins   PT G CodesArelia Sneddon S 06/25/2015, 12:48 PM

## 2015-06-25 NOTE — Care Management (Signed)
Admitted to [facility] with the diagnosis of CVA. Lives alone. Brother is [name] [phone]).  States he hasn't seen a physician in many years. Takes no prescription medications. Takes care of all basic and instrumental activities of daily living himself, drives. Independent prior to this admission.  No falls. Good appetite.  Physical therapy evaluation completed. Recommends skilled nursing facility. States that he is in agreement with this plan Shelbie Ammons RN MSN [place] management [phone]

## 2015-06-25 NOTE — Clinical Social Work Note (Signed)
Clinical Social Work Assessment  Patient Details  Name: Christopher Delacruz MRN: [id number] Date of Birth: 06-01-1950  Date of referral:  06/25/15               Reason for consult:  Facility Placement                Permission sought to share information with:  Chartered certified accountant granted to share information::  Yes, Verbal Permission Granted  Name::      [place]::   [place]   Relationship::     Contact Information:     Housing/Transportation Living arrangements for the past 2 months:  [place] of Information:  Patient Patient Interpreter Needed:  None Criminal Activity/Legal Involvement Pertinent to Current Situation/Hospitalization:  No - Comment as needed Significant Relationships:  Siblings Lives with:  Self Do you feel safe going back to the place where you live?  Yes Need for family participation in patient care:  Yes (Comment)  Care giving concerns:  Patient lives alone in [place].    Social Worker assessment / plan:  Holiday representative (CSW) received SNF consult. PT is recommending SNF. CSW met with patient alone at bedside. CSW introduced self and explained role of CSW department. Patient was alert and oriented and was sitting up in the bed. Patient reported that he lives alone in [place] and his brother Christopher Delacruz is his primary support. CSW explained that PT is recommending SNF. Patient is agreeable to SNF search in [facility]. Patient does not have a preference. CSW explained that Medicare requires a 3 night inpatient qualifying stay in a hospital in order to pay for SNF. Patient was admitted to inpatient on 06/24/15.   FL2 complete and faxed out. CSW will continue to follow and assist as needed.   Employment status:  Retired Forensic scientist:  [employer] PT Recommendations:  [place] / Referral to community resources:  [place]  Patient/Family's  Response to care:  Patient is agreeable to [facility] in [place].   Patient/Family's Understanding of and Emotional Response to Diagnosis, Current Treatment, and Prognosis:  Patient was pleasant and thanked CSW for visit.   Emotional Assessment Appearance:  Appears stated age Attitude/Demeanor/Rapport:    Affect (typically observed):  Accepting, Adaptable, Pleasant Orientation:  Oriented to Self, Oriented to Place, Oriented to  Time, Oriented to Situation Alcohol / Substance use:  Not Applicable Psych involvement (Current and /or in the community):  No (Comment)  Discharge Needs  Concerns to be addressed:  Discharge Planning Concerns Readmission within the last 30 days:  No Current discharge risk:  Dependent with Mobility Barriers to Discharge:  Continued Medical Work up   Loralyn Freshwater, LCSW 06/25/2015, 3:00 PM

## 2015-06-26 DIAGNOSIS — R1310 Dysphagia, unspecified: Secondary | ICD-10-CM

## 2015-06-26 DIAGNOSIS — I10 Essential (primary) hypertension: Secondary | ICD-10-CM

## 2015-06-26 DIAGNOSIS — R531 Weakness: Secondary | ICD-10-CM

## 2015-06-26 DIAGNOSIS — N289 Disorder of kidney and ureter, unspecified: Secondary | ICD-10-CM

## 2015-06-26 DIAGNOSIS — E876 Hypokalemia: Secondary | ICD-10-CM

## 2015-06-26 LAB — GLUCOSE, CAPILLARY
Glucose-Capillary: 122 mg/dL — ABNORMAL HIGH (ref 65–99)
Glucose-Capillary: 158 mg/dL — ABNORMAL HIGH (ref 65–99)
Glucose-Capillary: 89 mg/dL (ref 65–99)
Glucose-Capillary: 175 mg/dL — ABNORMAL HIGH (ref 65–99)

## 2015-06-26 MED ORDER — ONDANSETRON HCL 4 MG PO TABS: 4.0000 mg | ORAL_TABLET | Freq: Four times a day (QID) | ORAL | Status: DC | PRN

## 2015-06-26 MED ORDER — ASPIRIN 81 MG PO CHEW: 81.0000 mg | CHEWABLE_TABLET | Freq: Every day | ORAL | Status: DC

## 2015-06-26 MED ORDER — LISINOPRIL 20 MG PO TABS: 20.0000 mg | ORAL_TABLET | Freq: Two times a day (BID) | ORAL | Status: DC

## 2015-06-26 MED ORDER — SENNOSIDES-DOCUSATE SODIUM 8.6-50 MG PO TABS: 1.0000 | ORAL_TABLET | Freq: Every evening | ORAL | Status: DC | PRN

## 2015-06-26 NOTE — Clinical Social Work Note (Signed)
Patient has not had a 3 night qualifying stay and has been discharged. CSW made MD aware and she will discharge patient to home.  Shela Leff MSW,LCSW [phone]

## 2015-06-26 NOTE — Progress Notes (Signed)
Occupational Therapy Treatment Patient Details Name: Christopher Delacruz MRN: [id number] DOB: [birth year] Today's Date: 06/26/2015    History of present illness 65 y/o male was admitted with right sided weakness and AMS, CVA.   OT comments  Pt. Is a 65 y.o. Male who present with right sided weakness, and incoordination which hinders his ability to use his RUE efficiently for functional ADL and IADL tasks. Pt. Continues to benefit from skilled OT services to work on improving RUE strength and hand function for ADL tasks.     Follow Up Recommendations  SNF    Equipment Recommendations       Recommendations for Other Services      Precautions / Restrictions         Mobility Bed Mobility                  Transfers                      Balance                                   ADL Overall ADL's : Needs assistance/impaired                                       General ADL Comments: Pt. continues to require extensive assist for LE ADL tasks. Pt. worked on using his right handto access the remote, cell phone buttons.      Vision                     Perception Perception Inattention/Neglect: Impaired- to be further tested in functional context   Praxis      Cognition   Behavior During Therapy: [facility] for tasks assessed/performed Overall Cognitive Status: Within Functional Limits for tasks assessed                       Extremity/Trunk Assessment               Exercises  Pt. Participated in proximal isometric strengthening ex. In the [place].   Shoulder Instructions       General Comments      Pertinent Vitals/ Pain       Pain Assessment: No/denies pain  Home Living                                          Prior Functioning/Environment              Frequency Min 1X/week     Progress Toward Goals  OT Goals(current goals can now be found in the care plan section)  Progress  towards OT goals: Progressing toward goals  Acute Rehab OT Goals Patient Stated Goal: To regain full use of his RUE OT Goal Formulation: With patient Time For Goal Achievement: 07/09/15 Potential to Achieve Goals: Good  Plan Discharge plan remains appropriate    Co-evaluation                 End of Session     Activity Tolerance Patient tolerated treatment well   Patient Left in bed;with call bell/phone within reach;with bed alarm set   Nurse Communication  Time: 1350-1415 OT Time Calculation (min): 25 min  Charges: OT General Charges $OT Visit: 1 Procedure OT Treatments $Self Care/Home Management : 23-37 mins   Harrel Carina, MS, OTR/L  Harrel Carina 06/26/2015, 2:36 PM

## 2015-06-26 NOTE — Progress Notes (Signed)
Physical Therapy Treatment Patient Details Name: Christopher Delacruz MRN: [id number] DOB: 1951-01-19 Today's Date: 06/26/2015    History of Present Illness 65 y/o male was admitted with right sided weakness and AMS, CVA.    PT Comments    Pt with no voiced complaints. Weakness noted in Right lower extremity with long arc quad exercises with tightness in R hamstrings as well. Weakness/instability with ambulation in R knee; encouraged quad set with stance phase. Pt ambulates slowly 70 ft and participates in seated exercises. Received up in chair. Continue PT to progress strength and quality of ambulation to improve functional mobility.   Follow Up Recommendations  SNF     Equipment Recommendations  Rolling walker with 5" wheels    Recommendations for Other Services       Precautions / Restrictions Restrictions Weight Bearing Restrictions: No    Mobility  Bed Mobility Overal bed mobility: Modified Independent             General bed mobility comments: increased time and cues for sequence/direction for moving legs over edge of bed.  Transfers Overall transfer level: Needs assistance Equipment used: Rolling walker (2 wheeled) Transfers: Sit to/from Stand Sit to Stand: Min assist;From elevated surface         General transfer comment: Cues for proper hand placement several times; unable to rise from bed until surface elevated so that hips higher than knees  Ambulation/Gait Ambulation/Gait assistance: Min guard;Min assist Ambulation Distance (Feet): 70 Feet Assistive device: Rolling walker (2 wheeled) Gait Pattern/deviations: Step-through pattern (Decreased stability R knee) Gait velocity: decreased Gait velocity interpretation: Below normal speed for age/gender General Gait Details: Cues for QS with R stance phase. Mildly unsteady, but no severe buckling or LOB    Stairs            Wheelchair Mobility    Modified Rankin (Stroke Patients Only)       Balance  Overall balance assessment: Needs assistance Sitting-balance support: Feet supported Sitting balance-Leahy Scale: Good     Standing balance support: Bilateral upper extremity supported Standing balance-Leahy Scale: Fair Standing balance comment: RLE mildly unsteady                    Cognition Arousal/Alertness: Awake/alert Behavior During Therapy: WFL for tasks assessed/performed Overall Cognitive Status: Within Functional Limits for tasks assessed                      Exercises General Exercises - Lower Extremity Long Arc Quad: Strengthening;Both;15 reps;Seated;AAROM (AAROM R with noted tightness R post'r knee) Hip Flexion/Marching: Strengthening;AROM;15 reps;Seated Toe Raises: AROM;Both;15 reps;Seated Heel Raises: AROM;Both;15 reps;Seated Other Exercises Other Exercises: Gentle stretch to R hamstrings    General Comments        Pertinent Vitals/Pain Pain Assessment: No/denies pain    Home Living                      Prior Function            PT Goals (current goals can now be found in the care plan section) Acute Rehab PT Goals Patient Stated Goal: To regain full use of his RUE Progress towards PT goals: Progressing toward goals    Frequency  7X/week    PT Plan Current plan remains appropriate    Co-evaluation             End of Session   Activity Tolerance: Patient tolerated treatment well Patient left: in chair;with call  bell/phone within reach;with chair alarm set     Time: [id number] PT Time Calculation (min) (ACUTE ONLY): 23 min  Charges:  $Gait Training: 8-22 mins $Therapeutic Exercise: 8-22 mins                    G Codes:      Charlaine Dalton, PTA 06/26/2015, 4:59 PM

## 2015-06-26 NOTE — Discharge Summary (Addendum)
[facility] NAME: Christopher Delacruz    MR#:  [id number]  DATE OF BIRTH:  1950-04-25  DATE OF ADMISSION:  06/23/2015 ADMITTING PHYSICIAN: Quintella Baton, MD  DATE OF DISCHARGE: No discharge date for patient encounter.  PRIMARY CARE PHYSICIAN: No PCP Per Patient     ADMISSION DIAGNOSIS:  Weakness [R53.1] Non-traumatic rhabdomyolysis [M62.82] Cerebral infarction due to unspecified mechanism [I63.9]  DISCHARGE DIAGNOSIS:  Principal Problem:   CVA (cerebral infarction) Active Problems:   Right sided weakness   Dysphagia   Diabetes (HCC)   HTN (hypertension)   Essential hypertension, malignant   Acute renal insufficiency   Hypokalemia   SECONDARY DIAGNOSIS:   Past Medical History  Diagnosis Date  . Hypertension   . Hyperlipidemia   . Diabetes mellitus ([place])     .pro HOSPITAL COURSE:   The patient is a 65 year old African-American male with past medical history significant for diabetes, essential hypertension, who presents to the hospital with complaints of right weakness, disorientation, slurred speech.  On arrival to the hospital patient had a CT scan of head done revealing subacute left basilar ganglia infarct involving caudate head with associated petechial hemorrhages, there was also mass effect on the frontal horn of left ventricle, patient was initiated on Mannitol and the admitted to intensive care unit. He was seen by neurologist, Mannitol was discontinued and patient was transferred to regular medical floor. Patient complains of right-sided weakness, right lower extremity more than right upper extremity, some facial weakness, difficulty speaking, some dysphagia symptoms. Marland Kitchen He participated in physical therapy , skilled nursing facility placement was recommended, where patient was thought to be be discharged today, however he did not stay 3 nights yet, and although he was felt to be stable to be discharged to SNF, it  was felt to be dangerous to have him discharged to home. Discussion by problem: #1. Subacute stroke in subcortical region with right-sided weakness, slurred speech, dysphagia, continue patient on aspirin, Lipitor, occupational  and Physical therapist recommended skilled nursing facility placement. Patient was seen by neurologist, appreciated recommendations. Patient was able to walk 30 feet, but he was felt to be at risk of fall at home, since he lives alone and his mobility, balance is impaired, we'll await for tomorrow to dc patient to SNF for rehabilitation.  MRI of brain revealed acute infarct in left putamen and caudate nucleus with petechial hemorrhage, mild mass effect without midline shift, negative circle of Willis MRA, carotid ultrasound showed mild from 1-49% stenosis in proximal right internal carotid artery, no significant stenosis on the left. Echocardiogram with bubble study showed normal EF, moderate aortic and mild mitral valve regurgitation. Lipid panel: HDL 34, cholesterol 131, triglycerides 66, LDL 84, at target. Patient will be discharged to SNF tomorrow.  #2. Malignant Essential hypertension, permissive hypertension, continue current therapy, HCTZ was discontinued.  #3. Renal insufficiency, acute ,  Resolved with rehydration and holding HCTZ. #4 diabetes mellitus,HGb a1c 5.8, at target,  patient is now on dysphagia diet,  blood glucose levels are ranging between 95-160, hold home medications until oral input is good, patient eats 50-100% offered meals , sliding scale insulin as needed.  #5. Dysphagia, SLP evaluated patient, recommended dysphagia 3 diet with thin liquids #6. Hypokalemia, supplemented orally, discontinued HCTZ ,  magnesium level was normal #7. Right-sided weakness, patient is being prepared for skilled nursing facility placement today for rehab   DISCHARGE CONDITIONS:   stable  CONSULTS OBTAINED:  Treatment Team:  [name], MD  DRUG ALLERGIES:  No  Known Allergies  DISCHARGE MEDICATIONS:   Current Discharge Medication List    START taking these medications   Details  aspirin 81 MG chewable tablet Chew 1 tablet (81 mg total) by mouth daily. Qty: 30 tablet, Refills: 5    lisinopril (PRINIVIL,ZESTRIL) 20 MG tablet Take 1 tablet (20 mg total) by mouth 2 (two) times daily. Qty: 60 tablet, Refills: 5    ondansetron (ZOFRAN) 4 MG tablet Take 1 tablet (4 mg total) by mouth every 6 (six) hours as needed for nausea. Qty: 20 tablet, Refills: 0    senna-docusate (SENOKOT-S) 8.6-50 MG tablet Take 1 tablet by mouth at bedtime as needed for mild constipation. Qty: 30 tablet, Refills: 5      CONTINUE these medications which have NOT CHANGED   Details  amLODipine (NORVASC) 5 MG tablet Take 5 mg by mouth daily. Refills: 4    atorvastatin (LIPITOR) 80 MG tablet Take 80 mg by mouth at bedtime. Refills: 10    glyBURIDE-metformin (GLUCOVANCE) 5-500 MG tablet Take 1 tablet by mouth 2 (two) times daily. Refills: 4      STOP taking these medications     lisinopril-hydrochlorothiazide (PRINZIDE,ZESTORETIC) 20-12.5 MG tablet          DISCHARGE INSTRUCTIONS:    Patient is to follow up with PCP  If you experience worsening of your admission symptoms, develop shortness of breath, life threatening emergency, suicidal or homicidal thoughts you must seek medical attention immediately by calling 911 or calling your MD immediately  if symptoms less severe.  You Must read complete instructions/literature along with all the possible adverse reactions/side effects for all the Medicines you take and that have been prescribed to you. Take any new Medicines after you have completely understood and accept all the possible adverse reactions/side effects.   Please note  You were cared for by a hospitalist during your hospital stay. If you have any questions about your discharge medications or the care you received while you were in the hospital after  you are discharged, you can call the unit and asked to speak with the hospitalist on call if the hospitalist that took care of you is not available. Once you are discharged, your primary care physician will handle any further medical issues. Please note that NO REFILLS for any discharge medications will be authorized once you are discharged, as it is imperative that you return to your primary care physician (or establish a relationship with a primary care physician if you do not have one) for your aftercare needs so that they can reassess your need for medications and monitor your lab values.    Today   CHIEF COMPLAINT:   Chief Complaint  Patient presents with  . Altered Mental Status    HISTORY OF PRESENT ILLNESS:  Dmauri Hussein  is a 65 y.o. male with a known history of diabetes, essential hypertension, who presents to the hospital with complaints of right weakness, disorientation, slurred speech.  On arrival to the hospital patient had a CT scan of head done revealing subacute left basilar ganglia infarct involving caudate head with associated petechial hemorrhages, there was also mass effect on the frontal horn of left ventricle, patient was initiated on Mannitol and the admitted to intensive care unit. He was seen by neurologist, Mannitol was discontinued and patient was transferred to regular medical floor. Patient complains of right-sided weakness, right lower extremity more than right upper extremity, some facial weakness, difficulty speaking,  some dysphagia symptoms. Marland Kitchen He participated in physical therapy , skilled nursing facility placement was recommended, where patient was thought to be be discharged today, however he did not stay 3 nights yet, and although he was felt to be satble to be discharged to SNF, it was felt to be dangerous to have him discharged to home. Discussion by problem: #1. Subacute stroke in subcortical region with right-sided weakness, slurred speech, dysphagia,  continue patient on aspirin, Lipitor, occupational  and Physical therapist recommended skilled nursing facility placement. Patient was seen by neurologist, appreciated recommendations. Patient was able to walk 30 feet, but he was felt to be at risk of fall at home, since he lives alone and his mobility, balance is impaired, we'll await for tomorrow to dc patient to SNF for rehabilitation.  MRI of brain revealed acute infarct in left putamen and caudate nucleus with petechial hemorrhage, mild mass effect without midline shift, negative circle of Willis MRA, carotid ultrasound showed mild from 1-49% stenosis in proximal right internal carotid artery, no significant stenosis on the left. Echocardiogram with bubble study showed normal EF, moderate aortic and mild mitral valve regurgitation. Lipid panel: HDL 34, cholesterol 131, triglycerides 66, LDL 84, at target. Patient will be discharged to SNF tomorrow.  #2. Malignant Essential hypertension, permissive hypertension, continue current therapy, HCTZ was discontinued.  #3. Renal insufficiency, acute ,  Resolved with rehydration and holding HCTZ. #4 diabetes mellitus,HGb a1c 5.8, at target,  patient is now on dysphagia diet,  blood glucose levels are ranging between 95-160, hold home medications until oral input is good, patient eats 50-100% offered meals , sliding scale insulin as needed.  #5. Dysphagia, SLP evaluated patient, recommended dysphagia 3 diet with thin liquids #6. Hypokalemia, supplemented orally, discontinued HCTZ ,  magnesium level was normal #7. Right-sided weakness, patient is being prepared for skilled nursing facility placement today for rehab   VITAL SIGNS:  Blood pressure 151/79, pulse 52, temperature 98.8 F (37.1 C), temperature source Oral, resp. rate 20, height 6\' 7"  (2.007 m), weight 116.8 kg (257 lb 8 oz), SpO2 94 %.  I/O:   Intake/Output Summary (Last 24 hours) at 06/26/15 0836 Last data filed at 06/26/15 0119  Gross per  24 hour  Intake    240 ml  Output   1300 ml  Net  -1060 ml    PHYSICAL EXAMINATION:  GENERAL:  65 y.o.-year-old patient lying in the bed with no acute distress.  EYES: Pupils equal, round, reactive to light and accommodation. No scleral icterus. Extraocular muscles intact.  HEENT: Head atraumatic, normocephalic. Oropharynx and nasopharynx clear.  NECK:  Supple, no jugular venous distention. No thyroid enlargement, no tenderness.  LUNGS: Normal breath sounds bilaterally, no wheezing, rales,rhonchi or crepitation. No use of accessory muscles of respiration.  CARDIOVASCULAR: S1, S2 normal. No murmurs, rubs, or gallops.  ABDOMEN: Soft, non-tender, non-distended. Bowel sounds present. No organomegaly or mass.  EXTREMITIES: No pedal edema, cyanosis, or clubbing.  NEUROLOGIC: Cranial nerves II through XII are intact. Muscle strength 5/5 in left sided extremities, 3/5 in right sided, worse in lower extremity. Sensation intact. Gait not checked. slurring speech, some expressive aphasia.  PSYCHIATRIC: The patient is alert and oriented x 3.  SKIN: No obvious rash, lesion, or ulcer.   DATA REVIEW:   CBC  Recent Labs Lab 06/24/15 0339  WBC 10.0  HGB 13.7  HCT 42.2  PLT 180    Chemistries   Recent Labs Lab 06/23/15 1849  06/25/15 0520  NA 142  < >  141  K 3.4*  < > 3.4*  CL 106  < > 107  CO2 26  < > 29  GLUCOSE 137*  < > 108*  BUN 19  < > 15  CREATININE 1.53*  < > 1.19  CALCIUM 9.3  < > 8.9  MG  --   --  2.0  AST 43*  --   --   ALT 16*  --   --   ALKPHOS 80  --   --   BILITOT 0.9  --   --   < > = values in this interval not displayed.  Cardiac Enzymes  Recent Labs Lab 06/23/15 1849  TROPONINI 0.03    Microbiology Results  Results for orders placed or performed during the hospital encounter of 06/23/15  MRSA PCR Screening     Status: None   Collection Time: 06/24/15  2:06 AM  Result Value Ref Range Status   MRSA by PCR NEGATIVE NEGATIVE Final    Comment:        The  GeneXpert MRSA Assay (FDA approved for NASAL specimens only), is one component of a comprehensive MRSA colonization surveillance program. It is not intended to diagnose MRSA infection nor to guide or monitor treatment for MRSA infections.     RADIOLOGY:  Mr Virgel Paling Wo Contrast  06/24/2015  CLINICAL DATA:  Stroke EXAM: MRI HEAD WITHOUT CONTRAST MRA HEAD WITHOUT CONTRAST TECHNIQUE: Multiplanar, multiecho pulse sequences of the brain and surrounding structures were obtained without intravenous contrast. Angiographic images of the head were obtained using MRA technique without contrast. COMPARISON:  CT head 06/23/2015 FINDINGS: MRI HEAD FINDINGS Acute infarct in the left basal ganglia involving the putamen and caudate. There is petechial hemorrhage within the putamen infarct as noted on CT. Mild local mass-effect on the lateral ventricle without midline shift. No other acute infarct. Hypodensity right frontal lobe on CT appears to represent volume averaging of a sulcus. Moderate chronic microvascular ischemic changes throughout the white matter. Brainstem and cerebellum intact. Cerebral volume normal for age.  Ventricles not enlarged. Negative for mass lesion. Paranasal sinuses clear. No orbital mass. Pituitary normal in size. MRA HEAD FINDINGS Both vertebral arteries patent to the basilar. Left vertebral dominant. PICA patent bilaterally. Mild stenosis distal right vertebral artery. Superior cerebellar and posterior cerebral arteries patent bilaterally Internal carotid artery patent bilaterally without significant stenosis or aneurysm. Anterior and middle cerebral arteries patent bilaterally without significant stenosis. Negative for cerebral aneurysm. IMPRESSION: Acute infarct in the left putamen and caudate with petechial hemorrhage. Mild mass-effect without local midline shift. Findings are similar to the recent CT. No other acute infarct Moderate chronic microvascular ischemic change in the white  matter Negative MRA circle of  Willis. Electronically Signed   By: Franchot Gallo M.D.   On: 06/24/2015 14:24   Mr Brain Wo Contrast  06/24/2015  CLINICAL DATA:  Stroke EXAM: MRI HEAD WITHOUT CONTRAST MRA HEAD WITHOUT CONTRAST TECHNIQUE: Multiplanar, multiecho pulse sequences of the brain and surrounding structures were obtained without intravenous contrast. Angiographic images of the head were obtained using MRA technique without contrast. COMPARISON:  CT head 06/23/2015 FINDINGS: MRI HEAD FINDINGS Acute infarct in the left basal ganglia involving the putamen and caudate. There is petechial hemorrhage within the putamen infarct as noted on CT. Mild local mass-effect on the lateral ventricle without midline shift. No other acute infarct. Hypodensity right frontal lobe on CT appears to represent volume averaging of a sulcus. Moderate chronic microvascular ischemic changes throughout the white  matter. Brainstem and cerebellum intact. Cerebral volume normal for age.  Ventricles not enlarged. Negative for mass lesion. Paranasal sinuses clear. No orbital mass. Pituitary normal in size. MRA HEAD FINDINGS Both vertebral arteries patent to the basilar. Left vertebral dominant. PICA patent bilaterally. Mild stenosis distal right vertebral artery. Superior cerebellar and posterior cerebral arteries patent bilaterally Internal carotid artery patent bilaterally without significant stenosis or aneurysm. Anterior and middle cerebral arteries patent bilaterally without significant stenosis. Negative for cerebral aneurysm. IMPRESSION: Acute infarct in the left putamen and caudate with petechial hemorrhage. Mild mass-effect without local midline shift. Findings are similar to the recent CT. No other acute infarct Moderate chronic microvascular ischemic change in the white matter Negative MRA circle of  Willis. Electronically Signed   By: Franchot Gallo M.D.   On: 06/24/2015 14:24   US Carotid Bilateral  06/24/2015  CLINICAL  DATA:  65 year old male with acute left basal ganglia infarct EXAM: BILATERAL CAROTID DUPLEX ULTRASOUND TECHNIQUE: Pearline Cables scale imaging, color Doppler and duplex ultrasound were performed of bilateral carotid and vertebral arteries in the neck. COMPARISON:  Brain MRI 06/24/2015 FINDINGS: Criteria: Quantification of carotid stenosis is based on velocity parameters that correlate the residual internal carotid diameter with NASCET-based stenosis levels, using the diameter of the distal internal carotid lumen as the denominator for stenosis measurement. The following velocity measurements were obtained: RIGHT ICA:  52/11 cm/sec CCA:  [id number] cm/sec SYSTOLIC ICA/CCA RATIO:  0.3 DIASTOLIC ICA/CCA RATIO:  0.9 ECA:  117 cm/sec LEFT ICA:  99/18 cm/sec CCA:  [id number] cm/sec SYSTOLIC ICA/CCA RATIO:  0.5 DIASTOLIC ICA/CCA RATIO:  1.3 125 ECA:   cm/sec RIGHT CAROTID ARTERY: Trace focal heterogeneous atherosclerotic plaque in the proximal internal carotid artery. By peak systolic velocity criteria the estimated stenosis remains less than 50%. RIGHT VERTEBRAL ARTERY:  Patent with normal antegrade flow. LEFT CAROTID ARTERY: Tortuous anatomy. No focal atherosclerotic plaque or evidence of stenosis. LEFT VERTEBRAL ARTERY:  Patent with normal antegrade flow. IMPRESSION: 1. Mild (1-49%) stenosis proximal right internal carotid artery secondary to heterogenous atherosclerotic plaque. 2. No significant focal plaque or stenosis on the left. 3. Vertebral arteries are patent with normal antegrade flow. Signed, Criselda Peaches, MD [facility] Electronically Signed   By: Jacqulynn Cadet M.D.   On: 06/24/2015 13:43    EKG:   Orders placed or performed during the hospital encounter of 06/23/15  . EKG 12-Lead  . EKG 12-Lead  . ED EKG  . ED EKG      Management plans discussed with the patient, family and they are in agreement.  CODE STATUS:     Code Status Orders         Start     Ordered   06/24/15 0154  Full code   Continuous     06/24/15 0153    Code Status History    Date Active Date Inactive Code Status Order ID Comments User Context   This patient has a current code status but no historical code status.      TOTAL TIME TAKING CARE OF THIS PATIENT: 40 minutes.    Theodoro Grist M.D on 06/26/2015 at 8:36 AM  Between 7am to 6pm - Pager - [phone]  After 6pm go to [email or web address] - password [email or web address] [facility]  [facility]  Office  [phone]  CC: Primary care physician; No PCP Per Patient

## 2015-06-27 LAB — GLUCOSE, CAPILLARY
Glucose-Capillary: 121 mg/dL — ABNORMAL HIGH (ref 65–99)
Glucose-Capillary: 159 mg/dL — ABNORMAL HIGH (ref 65–99)

## 2015-06-27 MED ORDER — INSULIN ASPART 100 UNIT/ML ~~LOC~~ SOLN: 0.0000 [IU] | Freq: Every day | SUBCUTANEOUS | Status: DC

## 2015-06-27 MED ORDER — INSULIN ASPART 100 UNIT/ML ~~LOC~~ SOLN: 0.0000 [IU] | Freq: Three times a day (TID) | SUBCUTANEOUS | Status: DC

## 2015-06-27 NOTE — Discharge Summary (Addendum)
Christopher Delacruz NAME: Christopher Delacruz    MR#:  [id number]  DATE OF BIRTH:  1950/05/29  DATE OF ADMISSION:  06/23/2015 ADMITTING PHYSICIAN: Quintella Baton, MD  DATE OF DISCHARGE: No discharge date for patient encounter.  PRIMARY CARE PHYSICIAN: No PCP Per Patient     ADMISSION DIAGNOSIS:  Weakness [R53.1] Non-traumatic rhabdomyolysis [M62.82] Cerebral infarction due to unspecified mechanism [I63.9]  DISCHARGE DIAGNOSIS:  Principal Problem:   CVA (cerebral infarction) Active Problems:   Right sided weakness   Dysphagia   Diabetes (HCC)   HTN (hypertension)   Essential hypertension, malignant   Acute renal insufficiency   Hypokalemia   SECONDARY DIAGNOSIS:   Past Medical History  Diagnosis Date  . Hypertension   . Hyperlipidemia   . Diabetes mellitus ([place])     .pro HOSPITAL COURSE:   The patient is a 65 year old African-American male with past medical history significant for diabetes, essential hypertension, who presents to the hospital with complaints of right weakness, disorientation, slurred speech.  On arrival to the hospital patient had a CT scan of head done revealing subacute left basilar ganglia infarct involving caudate head with associated petechial hemorrhages, there was also mass effect on the frontal horn of left ventricle, patient was initiated on Mannitol and the admitted to intensive care unit. He was seen by neurologist, Mannitol was discontinued and patient was transferred to regular medical floor. Patient complains of right-sided weakness, right lower extremity more than right upper extremity, some facial weakness, difficulty speaking, some dysphagia symptoms, but overall feels improved strength in his right side . He participated in physical therapy , skilled nursing facility placement was recommended, but due to logistics , we could not discharge patient to facility and he will be returning home with [facility]  services, including PT, SLP. Discussion by problem: #1. Subacute stroke in subcortical region with right-sided weakness, slurred speech, dysphagia, expressive aphasia, continue patient on aspirin, Lipitor, occupational  and Physical therapist recommended skilled nursing facility placement. Patient was seen by neurologist, appreciated recommendations. Patient was able to walk 70 feet at reassessment by PT 06/26/15.   MRI of brain revealed acute infarct in left putamen and caudate nucleus with petechial hemorrhage, mild mass effect without midline shift, negative circle of Willis MRA, carotid ultrasound showed mild from 1-49% stenosis in proximal right internal carotid artery, no significant stenosis on the left. Echocardiogram with bubble study showed normal EF, moderate aortic and mild mitral valve regurgitation. Lipid panel: HDL 34, cholesterol 131, triglycerides 66, LDL 84, at target. Patient will be discharged to home with [facility] today..  #2. Malignant Essential hypertension, permissive hypertension, continue current therapy, HCTZ was discontinued.  #3. Renal insufficiency, acute ,  Resolved with rehydration and holding HCTZ. #4 diabetes mellitus,HGb a1c 5.8, at target,  patient is now on dysphagia diet,  blood glucose levels are ranging between 89-170, hold home medications until oral input is good, patient eats 50-100% offered meals , sliding scale insulin as needed.  #5. Dysphagia, SLP evaluated patient, recommended dysphagia 3 diet with thin liquids, SLP follow up at facility is recommended #6. Hypokalemia, supplemented orally, discontinued HCTZ ,  magnesium level was normal #7. Right-sided weakness, , improved some, continue PT, OT at home.    DISCHARGE CONDITIONS:   stable  CONSULTS OBTAINED:  Treatment Team:  [name], MD  DRUG ALLERGIES:  No Known Allergies  DISCHARGE MEDICATIONS:   Current Discharge Medication List    START taking these medications  Details  aspirin 81  MG chewable tablet Chew 1 tablet (81 mg total) by mouth daily. Qty: 30 tablet, Refills: 5    lisinopril (PRINIVIL,ZESTRIL) 20 MG tablet Take 1 tablet (20 mg total) by mouth 2 (two) times daily. Qty: 60 tablet, Refills: 5    ondansetron (ZOFRAN) 4 MG tablet Take 1 tablet (4 mg total) by mouth every 6 (six) hours as needed for nausea. Qty: 20 tablet, Refills: 0    senna-docusate (SENOKOT-S) 8.6-50 MG tablet Take 1 tablet by mouth at bedtime as needed for mild constipation. Qty: 30 tablet, Refills: 5      CONTINUE these medications which have NOT CHANGED   Details  amLODipine (NORVASC) 5 MG tablet Take 5 mg by mouth daily. Refills: 4    atorvastatin (LIPITOR) 80 MG tablet Take 80 mg by mouth at bedtime. Refills: 10    glyBURIDE-metformin (GLUCOVANCE) 5-500 MG tablet Take 1 tablet by mouth 2 (two) times daily. Refills: 4      STOP taking these medications     lisinopril-hydrochlorothiazide (PRINZIDE,ZESTORETIC) 20-12.5 MG tablet          DISCHARGE INSTRUCTIONS:    Patient is to follow up with PCP  If you experience worsening of your admission symptoms, develop shortness of breath, life threatening emergency, suicidal or homicidal thoughts you must seek medical attention immediately by calling 911 or calling your MD immediately  if symptoms less severe.  You Must read complete instructions/literature along with all the possible adverse reactions/side effects for all the Medicines you take and that have been prescribed to you. Take any new Medicines after you have completely understood and accept all the possible adverse reactions/side effects.   Please note  You were cared for by a hospitalist during your hospital stay. If you have any questions about your discharge medications or the care you received while you were in the hospital after you are discharged, you can call the unit and asked to speak with the hospitalist on call if the hospitalist that took care of you is not  available. Once you are discharged, your primary care physician will handle any further medical issues. Please note that NO REFILLS for any discharge medications will be authorized once you are discharged, as it is imperative that you return to your primary care physician (or establish a relationship with a primary care physician if you do not have one) for your aftercare needs so that they can reassess your need for medications and monitor your lab values.    Today   CHIEF COMPLAINT:   Chief Complaint  Patient presents with  . Altered Mental Status    HISTORY OF PRESENT ILLNESS:  Christopher Delacruz  is a 65 y.o. male with a known history of diabetes, essential hypertension, who presents to the hospital with complaints of right weakness, disorientation, slurred speech.  On arrival to the hospital patient had a CT scan of head done revealing subacute left basilar ganglia infarct involving caudate head with associated petechial hemorrhages, there was also mass effect on the frontal horn of left ventricle, patient was initiated on Mannitol and the admitted to intensive care unit. He was seen by neurologist, Mannitol was discontinued and patient was transferred to regular medical floor. Patient complains of right-sided weakness, right lower extremity more than right upper extremity, some facial weakness, difficulty speaking, some dysphagia symptoms, but overall feels improved strength in his right side . He participated in physical therapy , skilled nursing facility placement was recommended,but due to logistics ,  we could not discharge patient to facility and he will be returning home with [facility] services, including PT, SLP. Discussion by problem: #1. Subacute stroke in subcortical region with right-sided weakness, slurred speech, dysphagia, expressive aphasia, continue patient on aspirin, Lipitor, occupational  and Physical therapist recommended skilled nursing facility placement. Patient was seen by  neurologist, appreciated recommendations. Patient was able to walk 70 feet at reassessment by PT 06/26/15.   MRI of brain revealed acute infarct in left putamen and caudate nucleus with petechial hemorrhage, mild mass effect without midline shift, negative circle of Willis MRA, carotid ultrasound showed mild from 1-49% stenosis in proximal right internal carotid artery, no significant stenosis on the left. Echocardiogram with bubble study showed normal EF, moderate aortic and mild mitral valve regurgitation. Lipid panel: HDL 34, cholesterol 131, triglycerides 66, LDL 84, at target. Patient will be discharged to home with [facility] today..  #2. Malignant Essential hypertension, permissive hypertension, continue current therapy, HCTZ was discontinued.  #3. Renal insufficiency, acute ,  Resolved with rehydration and holding HCTZ. #4 diabetes mellitus,HGb a1c 5.8, at target,  patient is now on dysphagia diet,  blood glucose levels are ranging between 89-170, hold home medications until oral input is good, patient eats 50-100% offered meals , sliding scale insulin as needed.  #5. Dysphagia, SLP evaluated patient, recommended dysphagia 3 diet with thin liquids, SLP follow up at facility is recommended #6. Hypokalemia, supplemented orally, discontinued HCTZ ,  magnesium level was normal #7. Right-sided weakness, , improved some, continue PT, OT at home.   . .   VITAL SIGNS:  Blood pressure 158/73, pulse 55, temperature 97.8 F (36.6 C), temperature source Oral, resp. rate 16, height 6\' 7"  (2.007 m), weight 116.8 kg (257 lb 8 oz), SpO2 94 %.  I/O:    Intake/Output Summary (Last 24 hours) at 06/27/15 0845 Last data filed at 06/26/15 1813  Gross per 24 hour  Intake    600 ml  Output   1200 ml  Net   -600 ml    PHYSICAL EXAMINATION:  GENERAL:  66 y.o.-year-old patient lying in the bed with no acute distress.  EYES: Pupils equal, round, reactive to light and accommodation. No scleral icterus. Extraocular  muscles intact.  HEENT: Head atraumatic, normocephalic. Oropharynx and nasopharynx clear.  NECK:  Supple, no jugular venous distention. No thyroid enlargement, no tenderness.  LUNGS: Normal breath sounds bilaterally, no wheezing, rales,rhonchi or crepitation. No use of accessory muscles of respiration.  CARDIOVASCULAR: S1, S2 normal. No murmurs, rubs, or gallops.  ABDOMEN: Soft, non-tender, non-distended. Bowel sounds present. No organomegaly or mass.  EXTREMITIES: No pedal edema, cyanosis, or clubbing.  NEUROLOGIC: Cranial nerves II through XII, R facial weakness. Muscle strength 5/5 in left sided extremities, 4/5 in right UE, 3/5 in RLE. Sensation intact. Gait not checked. slurring speech, some expressive aphasia.  PSYCHIATRIC: The patient is alert and oriented x 3.  SKIN: No obvious rash, lesion, or ulcer.   DATA REVIEW:   CBC  Recent Labs Lab 06/24/15 0339  WBC 10.0  HGB 13.7  HCT 42.2  PLT 180    Chemistries   Recent Labs Lab 06/23/15 1849  06/25/15 0520  NA 142  < > 141  K 3.4*  < > 3.4*  CL 106  < > 107  CO2 26  < > 29  GLUCOSE 137*  < > 108*  BUN 19  < > 15  CREATININE 1.53*  < > 1.19  CALCIUM 9.3  < > 8.9  MG  --   --  2.0  AST 43*  --   --   ALT 16*  --   --   ALKPHOS 80  --   --   BILITOT 0.9  --   --   < > = values in this interval not displayed.  Cardiac Enzymes  Recent Labs Lab 06/23/15 1849  TROPONINI 0.03    Microbiology Results  Results for orders placed or performed during the hospital encounter of 06/23/15  MRSA PCR Screening     Status: None   Collection Time: 06/24/15  2:06 AM  Result Value Ref Range Status   MRSA by PCR NEGATIVE NEGATIVE Final    Comment:        The GeneXpert MRSA Assay (FDA approved for NASAL specimens only), is one component of a comprehensive MRSA colonization surveillance program. It is not intended to diagnose MRSA infection nor to guide or monitor treatment for MRSA infections.     RADIOLOGY:  No  results found.  EKG:   Orders placed or performed during the hospital encounter of 06/23/15  . EKG 12-Lead  . EKG 12-Lead  . ED EKG  . ED EKG  . EKG      Management plans discussed with the patient, family and they are in agreement.  CODE STATUS:     Code Status Orders        Start     Ordered   06/24/15 0154  Full code   Continuous     06/24/15 0153    Code Status History    Date Active Date Inactive Code Status Order ID Comments User Context   This patient has a current code status but no historical code status.      TOTAL TIME TAKING CARE OF THIS PATIENT: 40 minutes.    Theodoro Grist M.D on 06/27/2015 at 8:45 AM  Between 7am to 6pm - Pager - [phone]  After 6pm go to [email or web address] - password [email or web address] [facility]  [place] [facility]  Office  [phone]  CC: Primary care physician; No PCP Per Patient

## 2015-06-27 NOTE — Evaluation (Signed)
Speech Language Pathology Evaluation Patient Details Name: Christopher Delacruz MRN: [id number] DOB: 22-Oct-1950 Today's Date: 06/27/2015 Time: 0930-1030 SLP Time Calculation (min) (ACUTE ONLY): 60 min  Problem List:  Patient Active Problem List   Diagnosis Date Noted  . Right sided weakness 06/26/2015  . Dysphagia 06/26/2015  . Essential hypertension, malignant 06/26/2015  . Acute renal insufficiency 06/26/2015  . Hypokalemia 06/26/2015  . CVA (cerebral infarction) 06/23/2015  . Diabetes ([place]) 06/23/2015  . HTN (hypertension) 06/23/2015   Past Medical History:  Past Medical History  Diagnosis Date  . Hypertension   . Hyperlipidemia   . Diabetes mellitus ([place])    Past Surgical History: History reviewed. No pertinent past surgical history. HPI:  Pt is a 65 year old African-American male with past medical history significant for diabetes, essential hypertension, who presents to the hospital with complaints of right weakness, disorientation, slurred speech, difficulty expressing himself. On arrival to the hospital, patient had a CT scan of head done revealing subacute left basilar ganglia infarct; there was also mass effect on the frontal horn of left ventricle. Pt c/o R U/L extremity weakness at admission; some facial weakness and difficulty speaking at admission was noted. Pt stated he was feeling "stronger" today; R sided weakness is "better" and he is communicating w/ others appropriately - NSG agreed. Pt continues to exhibit reduced articulation of speech, though this could be pt's baseline. Per pt and others' report(friends who know him), they state when pt slows down "you understand him better"; pt states he "talks like I do at home"; He does acknowledge not being able to remember a word or his address. Pt denied any difficutly swallowing; tolerating current diet w/out deficits noted per NSG.   Assessment / Plan / Recommendation Clinical Impression  Pt was assessed at bedside using the  [employer] Aphasia Battery Bedside Form. Pt completed the sections for Aphasia and Apraxia; the sections for Language including Reading and Writing were not addressed at this eval. Pt did not finish High School; unsure of his premorbid status. Pt also exhibits a more mumbled, rushed speech pattern that he states is premorbid and denies having any speech deficits/Dysarthria. Friends have acknowledged this speech pattern but are unsure if it is any worse. Pt is able to easily increase his intelligibility by slowing his rate and increasing volume. During the WAB screening, pt appeared to present w/ adequate Auditory Comprehension and Motor Speech abilities; no gross deficits were noted. During Expressive language tasks, pt exhibited mild+ word finding deficits w/ hesitations during recall; he also exhibited inconsistent errors w/ self-correction. Pt responded well to min verbal cue which increased accuracy of recall during word finding hesitations. This may have impacted the content of language during expresive speech task (as well as the impact of pt's baseline educational level). Pt was able to verbally communicate w/ others his wants/needs; identified lunch items he wanted for his meal. He was able to describe how to call 911 in case of emergency and identify 2/2 situations in which he would call for help/assistance. Of note, pt was oriented to person/place/situation but not to month/year. Pt appears to present w/ mild Expressive language deficits at this time and could benefit from skilled ST services to enhance communication skills for ADLs as well as further assess if any other areas of cognitive-linguistic deficits if any.      SLP Assessment  Patient needs continued Speech Lanaguage Pathology Services    Follow Up Recommendations  Home health SLP;Outpatient SLP;Skilled Nursing facility (TBD)    Frequency  and Duration min 2x/week (if Outpt/HH)  2 weeks      SLP Evaluation Prior Functioning   Cognitive/Linguistic Baseline: Within functional limits Type of Home: House  Lives With: Alone Available Help at Discharge: Friend(s) (per report) Education: did Not finish [employer]  Vocation:  (employment)   Cognition  Overall Cognitive Status: Impaired/Different from baseline (unsure of his baseline; no family present) Arousal/Alertness: Awake/alert Orientation Level: Oriented to person;Oriented to place;Oriented to situation;Disoriented to time Attention: Focused;Sustained Focused Attention: Appears intact Sustained Attention: Appears intact Memory: Appears intact Awareness: Appears intact (did not specifically assess) Behaviors:  (none) Safety/Judgment: Appears intact (in conversation)    Comprehension  Auditory Comprehension Overall Auditory Comprehension: Appears within functional limits for tasks assessed Yes/No Questions: Within Functional Limits Commands: Within Functional Limits Conversation: Simple Other Conversation Comments: unsure of pt's baseline as he did not finish HS Interfering Components:  (none except his reduced articulation during speech) EffectiveTechniques: Slowed speech Reading Comprehension Reading Status: Not tested    Expression Expression Primary Mode of Expression: Verbal Verbal Expression Overall Verbal Expression: Impaired Initiation: No impairment Automatic Speech: Name;Social Response;Counting;Day of week Level of Generative/Spontaneous Verbalization: Phrase;Sentence Repetition: No impairment Naming: Impairment Responsive: 76-100% accurate Other Naming Comments: accuracy increased w/ min verbal cue Pragmatics: No impairment Written Expression Dominant Hand: Right Written Expression: Not tested   Oral / Motor  Oral Motor/Sensory Function Overall Oral Motor/Sensory Function: Within functional limits Motor Speech Overall Motor Speech: Appears within functional limits for tasks assessed Respiration: Within functional  limits Phonation: Normal Resonance: Within functional limits Articulation:  (reduced at his baseline which impacts intelligibility) Intelligibility: Intelligibility reduced (baseline) Motor Planning: Witnin functional limits Motor Speech Errors: Not applicable Interfering Components: Premorbid status (speech articulation) Effective Techniques: Slow rate;Increased vocal intensity   GO                     Orinda Kenner, MS, CCC-SLP  [name],[name] 06/27/2015, 11:41 AM

## 2015-06-27 NOTE — Care Management Important Message (Signed)
Important Message  Patient Details  Name: Christopher Delacruz MRN: [id number] Date of Birth: [birth year]   Medicare Important Message Given:  Yes    Shelbie Ammons, RN 06/27/2015, 11:52 AM

## 2015-06-27 NOTE — Progress Notes (Signed)
Discharged home via brother by wheel chair-instructions reviewed and given to patient

## 2015-06-27 NOTE — Care Management (Signed)
Discharge to home today per Dr. Ether Griffins. Will arrange home health, physical therapy, and speech for the home. Discussed agencies with Mr. Filak. [place], Will update Floydene Flock, representative for [facility].  Mr. Krupa indicated that he goes to [facility] for his physician follow-up. Couldn't name the physician  Attempting to call his brother [name] for transportation. Shelbie Ammons RN MSN CCM Care Management [phone]

## 2015-06-29 ENCOUNTER — Emergency Department: Payer: Medicare Other

## 2015-06-29 ENCOUNTER — Inpatient Hospital Stay
Admission: EM | Admit: 2015-06-29 | Discharge: 2015-07-03 | DRG: 066 | Disposition: A | Discharge status: Skilled Nursing Facility | Payer: Medicare Other | Attending: Internal Medicine | Admitting: Internal Medicine

## 2015-06-29 DIAGNOSIS — Z79899 Other long term (current) drug therapy: Secondary | ICD-10-CM

## 2015-06-29 DIAGNOSIS — I10 Essential (primary) hypertension: Secondary | ICD-10-CM | POA: Diagnosis present

## 2015-06-29 DIAGNOSIS — R410 Disorientation, unspecified: Secondary | ICD-10-CM

## 2015-06-29 DIAGNOSIS — R29898 Other symptoms and signs involving the musculoskeletal system: Secondary | ICD-10-CM

## 2015-06-29 DIAGNOSIS — R4789 Other speech disturbances: Secondary | ICD-10-CM

## 2015-06-29 DIAGNOSIS — I639 Cerebral infarction, unspecified: Secondary | ICD-10-CM | POA: Diagnosis not present

## 2015-06-29 DIAGNOSIS — Z794 Long term (current) use of insulin: Secondary | ICD-10-CM

## 2015-06-29 DIAGNOSIS — E785 Hyperlipidemia, unspecified: Secondary | ICD-10-CM | POA: Diagnosis present

## 2015-06-29 DIAGNOSIS — E119 Type 2 diabetes mellitus without complications: Secondary | ICD-10-CM | POA: Diagnosis present

## 2015-06-29 LAB — CBC
HCT: 41.5 % (ref 40.0–52.0)
Hemoglobin: 13.6 g/dL (ref 13.0–18.0)
MCH: 27.7 pg (ref 26.0–34.0)
MCHC: 32.7 g/dL (ref 32.0–36.0)
MCV: 84.5 fL (ref 80.0–100.0)
Platelets: 209 10*3/uL (ref 150–440)
RBC: 4.91 MIL/uL (ref 4.40–5.90)
RDW: 13.5 % (ref 11.5–14.5)
WBC: 10.5 10*3/uL (ref 3.8–10.6)

## 2015-06-29 LAB — COMPREHENSIVE METABOLIC PANEL
ALT: 13 U/L — ABNORMAL LOW (ref 17–63)
AST: 18 U/L (ref 15–41)
Albumin: 4.1 g/dL (ref 3.5–5.0)
Alkaline Phosphatase: 85 U/L (ref 38–126)
Anion gap: 7 (ref 5–15)
BUN: 19 mg/dL (ref 6–20)
CO2: 26 mmol/L (ref 22–32)
Calcium: 9.5 mg/dL (ref 8.9–10.3)
Chloride: 109 mmol/L (ref 101–111)
Creatinine, Ser: 1.44 mg/dL — ABNORMAL HIGH (ref 0.61–1.24)
GFR calc Af Amer: 57 mL/min — ABNORMAL LOW (ref >60)
GFR calc non Af Amer: 49 mL/min — ABNORMAL LOW (ref >60)
Glucose, Bld: 134 mg/dL — ABNORMAL HIGH (ref 65–99)
Potassium: 4.1 mmol/L (ref 3.5–5.1)
Sodium: 142 mmol/L (ref 135–145)
Total Bilirubin: 0.7 mg/dL (ref 0.3–1.2)
Total Protein: 7.5 g/dL (ref 6.5–8.1)

## 2015-06-29 MED ORDER — DEXAMETHASONE SODIUM PHOSPHATE 10 MG/ML IJ SOLN
10.0000 mg | Freq: Once | INTRAMUSCULAR | Status: AC
  Administered 2015-06-29: 10 mg via INTRAVENOUS
  Filled 2015-06-29: qty 1

## 2015-06-29 NOTE — ED Notes (Signed)
Pt able to sip and swallow water without difficulty, cont to monitor

## 2015-06-29 NOTE — ED Provider Notes (Addendum)
[facility] Emergency Department Provider Note  ____________________________________________  Time seen: 8:45 PM  I have reviewed the triage vital signs and the nursing notes.   HISTORY  Chief Complaint Altered Mental Status    HPI Christopher Delacruz is a 65 y.o. male who complains of were finding difficulty confusion and left arm weakness. Trace admitted about a week ago with a stroke with right leg weakness. Left the hospital and had seemed to be doing okay, but over the last 1-2 days he's had worsening confusion, left arm weakness, headache. He's been forgetful, leaving the stove on and forgetting to eat all day. He has trouble finding the right word and frequently says and correct word.     Past Medical History  Diagnosis Date  . Hypertension   . Hyperlipidemia   . Diabetes mellitus [facility])      Patient Active Problem List   Diagnosis Date Noted  . Right sided weakness 06/26/2015  . Dysphagia 06/26/2015  . Essential hypertension, malignant 06/26/2015  . Acute renal insufficiency 06/26/2015  . Hypokalemia 06/26/2015  . CVA (cerebral infarction) 06/23/2015  . Diabetes ([place]) 06/23/2015  . HTN (hypertension) 06/23/2015     No past surgical history on file.   Current Outpatient Rx  Name  Route  Sig  Dispense  Refill  . amLODipine (NORVASC) 5 MG tablet   Oral   Take 5 mg by mouth daily.      4   . aspirin 81 MG chewable tablet   Oral   Chew 1 tablet (81 mg total) by mouth daily.   30 tablet   5   . atorvastatin (LIPITOR) 80 MG tablet   Oral   Take 80 mg by mouth at bedtime.      10   . insulin aspart (NOVOLOG) 100 UNIT/ML injection   Subcutaneous   Inject 0-15 Units into the skin 3 (three) times daily with meals.   10 mL   11   . insulin aspart (NOVOLOG) 100 UNIT/ML injection   Subcutaneous   Inject 0-5 Units into the skin at bedtime.   10 mL   11   . lisinopril (PRINIVIL,ZESTRIL) 20 MG tablet   Oral   Take 1 tablet (20 mg  total) by mouth 2 (two) times daily.   60 tablet   5   . ondansetron (ZOFRAN) 4 MG tablet   Oral   Take 1 tablet (4 mg total) by mouth every 6 (six) hours as needed for nausea.   20 tablet   0   . senna-docusate (SENOKOT-S) 8.6-50 MG tablet   Oral   Take 1 tablet by mouth at bedtime as needed for mild constipation.   30 tablet   5      Allergies Review of patient's allergies indicates no known allergies.   No family history on file.  Social History Social History  Substance Use Topics  . Smoking status: Never Smoker   . Smokeless tobacco: Not on file  . Alcohol Use: Not on file    Review of Systems  Constitutional:   No fever or chills.  Eyes:   No vision changes.  ENT:   No sore throat. No rhinorrhea. Cardiovascular:   No chest pain. Respiratory:   No dyspnea or cough. Gastrointestinal:   Negative for abdominal pain, vomiting and diarrhea.  Genitourinary:   Negative for dysuria or difficulty urinating. Musculoskeletal:   Negative for focal pain or swelling Neurological:   Positive headache, left arm weakness. Difficulty speaking  10-point ROS otherwise negative.  ____________________________________________   PHYSICAL EXAM:  VITAL SIGNS: ED Triage Vitals  Enc Vitals Group     BP 06/29/15 1755 163/76 mmHg     Pulse Rate 06/29/15 1755 66     Resp 06/29/15 1755 18     Temp 06/29/15 1755 98 F (36.7 C)     Temp Source 06/29/15 1755 Oral     SpO2 06/29/15 1755 95 %     Weight 06/29/15 1755 257 lb (116.574 kg)     Height 06/29/15 1755 6' (1.829 m)     Head Cir --      Peak Flow --      Pain Score 06/29/15 2015 0     Pain Loc --      Pain Edu? --      Excl. in [place]? --     Vital signs reviewed, nursing assessments reviewed.   Constitutional:   Alert and oriented. Well appearing and in no distress. Eyes:   No scleral icterus. No conjunctival pallor. PERRL. EOMI.  No nystagmus. ENT   Head:   Normocephalic and atraumatic.   Nose:   No  congestion/rhinnorhea. No septal hematoma   Mouth/Throat:   MMM, no pharyngeal erythema. No peritonsillar mass.    Neck:   No stridor. No SubQ emphysema. No meningismus. Hematological/Lymphatic/Immunilogical:   No cervical lymphadenopathy. Cardiovascular:   RRR. Symmetric bilateral radial and DP pulses.  No murmurs.  Respiratory:   Normal respiratory effort without tachypnea nor retractions. Breath sounds are clear and equal bilaterally. No wheezes/rales/rhonchi. Gastrointestinal:   Soft and nontender. Non distended. There is no CVA tenderness.  No rebound, rigidity, or guarding. Genitourinary:   deferred Musculoskeletal:   Nontender with normal range of motion in all extremities. No joint effusions.  No lower extremity tenderness.  No edema. Neurologic:   Frequent malapropism, difficulty finding the right word.  CN 2-10 normal. 3 out of 5 strength left arm. Lower extremity symmetric strength No pronator drift Finger to nose symmetric. No gross focal neurologic deficits are appreciated.  NIH stroke scale 3 Skin:    Skin is warm, dry and intact. No rash noted.  No petechiae, purpura, or bullae.  ____________________________________________    LABS (pertinent positives/negatives) (all labs ordered are listed, but only abnormal results are displayed) Labs Reviewed  COMPREHENSIVE METABOLIC PANEL - Abnormal; Notable for the following:    Glucose, Bld 134 (*)    Creatinine, Ser 1.44 (*)    ALT 13 (*)    GFR calc non Af Amer 49 (*)    GFR calc Af Amer 57 (*)    All other components within normal limits  CBC  CBG MONITORING, ED   ____________________________________________   EKG  Interpreted by me Sinus rhythm rate of 69, left axis, normal intervals. Normal QRS ST segments and T waves. Voltage criteria for LVH in the high lateral leads.  ____________________________________________    RADIOLOGY  CT head unchanged from previous with infarct of the left basal  ganglia.  ____________________________________________   PROCEDURES   ____________________________________________   INITIAL IMPRESSION / ASSESSMENT AND PLAN / ED COURSE  Pertinent labs & imaging results that were available during my care of the patient were reviewed by me and considered in my medical decision making (see chart for details).  Patient presents with new neurologic complaints in the setting of recent stroke. No evidence of hemorrhagic conversion or significant cerebral edema. Previously identified stroke in the left basal ganglia appears unchanged. However has symptoms  of right-sided stroke. We'll give Decadron, aspirin. Discussed with hospitalist for further evaluation. We'll go ahead and order MRI pending hospitalist evaluation.     ____________________________________________   FINAL CLINICAL IMPRESSION(S) / ED DIAGNOSES  Final diagnoses:  Acute CVA (cerebrovascular accident) ([place])  Left arm weakness  Word finding difficulty  Confusion       Portions of this note were generated with dragon dictation software. Dictation errors may occur despite best attempts at proofreading.   Carrie Mew, MD 06/29/15 [place], MD 06/29/15 2235

## 2015-06-29 NOTE — ED Notes (Signed)
Pt brought in by his family, pt's home health nurse went out to check him and he was confused, pt was d/c'd home on Wednesday from being diagnosed with a stroke on Saturday. Pt is found to be confused by the family , unable to answer questions he could normally answer, not eating well, and didn't take any meds today

## 2015-06-30 DIAGNOSIS — I63312 Cerebral infarction due to thrombosis of left middle cerebral artery: Secondary | ICD-10-CM | POA: Diagnosis not present

## 2015-06-30 DIAGNOSIS — I10 Essential (primary) hypertension: Secondary | ICD-10-CM | POA: Diagnosis present

## 2015-06-30 DIAGNOSIS — Z79899 Other long term (current) drug therapy: Secondary | ICD-10-CM | POA: Diagnosis not present

## 2015-06-30 DIAGNOSIS — E119 Type 2 diabetes mellitus without complications: Secondary | ICD-10-CM | POA: Diagnosis present

## 2015-06-30 DIAGNOSIS — E785 Hyperlipidemia, unspecified: Secondary | ICD-10-CM | POA: Diagnosis present

## 2015-06-30 DIAGNOSIS — Z794 Long term (current) use of insulin: Secondary | ICD-10-CM | POA: Diagnosis not present

## 2015-06-30 DIAGNOSIS — I639 Cerebral infarction, unspecified: Secondary | ICD-10-CM | POA: Diagnosis present

## 2015-06-30 LAB — URINALYSIS COMPLETE WITH MICROSCOPIC (ARMC ONLY)
Bacteria, UA: NONE SEEN
Bilirubin Urine: NEGATIVE
Glucose, UA: 50 mg/dL — AB
Hgb urine dipstick: NEGATIVE
Ketones, ur: NEGATIVE mg/dL
Leukocytes, UA: NEGATIVE
Nitrite: NEGATIVE
Protein, ur: >500 mg/dL — AB
RBC / HPF: NONE SEEN RBC/hpf (ref 0–5)
Specific Gravity, Urine: 1.023 (ref 1.005–1.030)
Squamous Epithelial / LPF: NONE SEEN
pH: 5 (ref 5.0–8.0)

## 2015-06-30 LAB — GLUCOSE, CAPILLARY
Glucose-Capillary: 120 mg/dL — ABNORMAL HIGH (ref 65–99)
Glucose-Capillary: 190 mg/dL — ABNORMAL HIGH (ref 65–99)
Glucose-Capillary: 135 mg/dL — ABNORMAL HIGH (ref 65–99)
Glucose-Capillary: 96 mg/dL (ref 65–99)

## 2015-06-30 LAB — TSH: TSH: 1.506 u[IU]/mL (ref 0.350–4.500)

## 2015-06-30 MED ORDER — LISINOPRIL 20 MG PO TABS
20.0000 mg | ORAL_TABLET | Freq: Two times a day (BID) | ORAL | Status: DC
  Administered 2015-06-30 – 2015-07-03 (×7): 20 mg via ORAL
  Filled 2015-06-30 (×7): qty 1

## 2015-06-30 MED ORDER — AMLODIPINE BESYLATE 5 MG PO TABS
5.0000 mg | ORAL_TABLET | Freq: Every day | ORAL | Status: DC
  Administered 2015-06-30 – 2015-07-03 (×4): 5 mg via ORAL
  Filled 2015-06-30 (×4): qty 1

## 2015-06-30 MED ORDER — ONDANSETRON HCL 4 MG/2ML IJ SOLN
4.0000 mg | Freq: Four times a day (QID) | INTRAMUSCULAR | Status: DC | PRN
  Filled 2015-06-30: qty 2

## 2015-06-30 MED ORDER — ONDANSETRON HCL 4 MG PO TABS: 4.0000 mg | ORAL_TABLET | Freq: Four times a day (QID) | ORAL | Status: DC | PRN

## 2015-06-30 MED ORDER — ACETAMINOPHEN 650 MG RE SUPP
650.0000 mg | Freq: Four times a day (QID) | RECTAL | Status: DC | PRN
  Filled 2015-06-30: qty 1

## 2015-06-30 MED ORDER — ATORVASTATIN CALCIUM 80 MG PO TABS
80.0000 mg | ORAL_TABLET | Freq: Every day | ORAL | Status: DC
  Administered 2015-06-30 – 2015-07-02 (×3): 80 mg via ORAL
  Filled 2015-06-30 (×2): qty 4
  Filled 2015-06-30: qty 1

## 2015-06-30 MED ORDER — ASPIRIN 81 MG PO CHEW
81.0000 mg | CHEWABLE_TABLET | Freq: Every day | ORAL | Status: DC
  Administered 2015-06-30 – 2015-07-03 (×4): 81 mg via ORAL
  Filled 2015-06-30 (×4): qty 1

## 2015-06-30 MED ORDER — DOCUSATE SODIUM 100 MG PO CAPS
100.0000 mg | ORAL_CAPSULE | Freq: Two times a day (BID) | ORAL | Status: DC
  Administered 2015-06-30 – 2015-07-03 (×7): 100 mg via ORAL
  Filled 2015-06-30 (×7): qty 1

## 2015-06-30 MED ORDER — SODIUM CHLORIDE 0.9 % IV SOLN
INTRAVENOUS | Status: DC
  Administered 2015-06-30: 03:00:00 via INTRAVENOUS

## 2015-06-30 MED ORDER — ACETAMINOPHEN 325 MG PO TABS
650.0000 mg | ORAL_TABLET | Freq: Four times a day (QID) | ORAL | Status: DC | PRN
  Filled 2015-06-30: qty 2

## 2015-06-30 MED ORDER — MORPHINE SULFATE (PF) 2 MG/ML IV SOLN
2.0000 mg | INTRAVENOUS | Status: DC | PRN
  Filled 2015-06-30: qty 1

## 2015-06-30 MED ORDER — HEPARIN SODIUM (PORCINE) 5000 UNIT/ML IJ SOLN
5000.0000 [IU] | Freq: Three times a day (TID) | INTRAMUSCULAR | Status: DC
  Administered 2015-06-30 – 2015-07-02 (×7): 5000 [IU] via SUBCUTANEOUS
  Filled 2015-06-30 (×8): qty 1

## 2015-06-30 MED ORDER — SODIUM CHLORIDE 0.9% FLUSH
3.0000 mL | Freq: Two times a day (BID) | INTRAVENOUS | Status: DC
  Administered 2015-06-30 – 2015-07-03 (×7): 3 mL via INTRAVENOUS
  Filled 2015-06-30: qty 3

## 2015-06-30 MED ORDER — ONDANSETRON HCL 4 MG PO TABS
4.0000 mg | ORAL_TABLET | Freq: Four times a day (QID) | ORAL | Status: DC | PRN
  Filled 2015-06-30: qty 1

## 2015-06-30 MED ORDER — INSULIN ASPART 100 UNIT/ML ~~LOC~~ SOLN
0.0000 [IU] | Freq: Three times a day (TID) | SUBCUTANEOUS | Status: DC
  Administered 2015-06-30: 3 [IU] via SUBCUTANEOUS
  Administered 2015-06-30 – 2015-07-01 (×3): 2 [IU] via SUBCUTANEOUS
  Administered 2015-07-02: 3 [IU] via SUBCUTANEOUS
  Administered 2015-07-03: 2 [IU] via SUBCUTANEOUS
  Filled 2015-06-30: qty 2
  Filled 2015-06-30 (×2): qty 3
  Filled 2015-06-30: qty 1
  Filled 2015-06-30 (×2): qty 2

## 2015-06-30 MED ORDER — INSULIN ASPART 100 UNIT/ML ~~LOC~~ SOLN: 0.0000 [IU] | Freq: Every day | SUBCUTANEOUS | Status: DC

## 2015-06-30 NOTE — Progress Notes (Signed)
[facility] NAME: Christopher Delacruz    MRN#:  [id number]  DATE OF BIRTH:  1950/02/21  SUBJECTIVE:  Hospital Day: 0 days Christopher Delacruz is a 65 y.o. male presenting with Altered Mental Status .   Overnight events: Admitted today Interval Events: Poor historian, patient states difficulty walking  REVIEW OF SYSTEMS:  CONSTITUTIONAL: No fever, fatigue And positive weakness.  EYES: No blurred or double vision.  EARS, NOSE, AND THROAT: No tinnitus or ear pain.  RESPIRATORY: No cough, shortness of breath, wheezing or hemoptysis.  CARDIOVASCULAR: No chest pain, orthopnea, edema.  GASTROINTESTINAL: No nausea, vomiting, diarrhea or abdominal pain.  GENITOURINARY: No dysuria, hematuria.  ENDOCRINE: No polyuria, nocturia,  HEMATOLOGY: No anemia, easy bruising or bleeding SKIN: No rash or lesion. MUSCULOSKELETAL: No joint pain or arthritis.   NEUROLOGIC: No tingling, numbness, positive weakness.  PSYCHIATRY: No anxiety or depression.   DRUG ALLERGIES:  No Known Allergies  VITALS:  Blood pressure 147/84, pulse 59, temperature 98.1 F (36.7 C), temperature source Oral, resp. rate 18, height 6\' 1"  (1.854 m), weight 277 lb 6.4 oz (125.828 kg), SpO2 93 %.  PHYSICAL EXAMINATION:  VITAL SIGNS: Filed Vitals:   06/30/15 0810 06/30/15 0915  BP: 147/84   Pulse: 59   Temp: 98.1 F (36.7 C) 98.1 F (36.7 C)  Resp: 18    GENERAL:65 y.o.male currently in no acute distress.  HEAD: Normocephalic, atraumatic.  EYES: Pupils equal, round, reactive to light. Extraocular muscles intact. No scleral icterus.  MOUTH: Moist mucosal membrane. Dentition intact. No abscess noted.  EAR, NOSE, THROAT: Clear without exudates. No external lesions.  NECK: Supple. No thyromegaly. No nodules. No JVD.  PULMONARY: Clear to ascultation, without wheeze rails or rhonci. No use of accessory muscles, Good respiratory effort. good air entry bilaterally CHEST: Nontender  to palpation.  CARDIOVASCULAR: S1 and S2. Regular rate and rhythm. No murmurs, rubs, or gallops. No edema. Pedal pulses 2+ bilaterally.  GASTROINTESTINAL: Soft, nontender, nondistended. No masses. Positive bowel sounds. No hepatosplenomegaly.  MUSCULOSKELETAL: No swelling, clubbing, or edema. Range of motion full in all extremities.  NEUROLOGIC: Difficult to fully assess given patient's mental status, appears to grossly have full range of motion however strength testing right lower extremity proximal 4/5 remainder of exam within normal limits, expressive aphasia otherwise Cranial nerves II through XII are intact. Sensation intact. Reflexes intact.  SKIN: No ulceration, lesions, rashes, or cyanosis. Skin warm and dry. Turgor intact.  PSYCHIATRIC: Mood, affect within normal limits. The patient is awake, alert and oriented x 3. Insight, judgment intact.      LABORATORY PANEL:   CBC  Recent Labs Lab 06/29/15 1809  WBC 10.5  HGB 13.6  HCT 41.5  PLT 209   ------------------------------------------------------------------------------------------------------------------  Chemistries   Recent Labs Lab 06/25/15 0520 06/29/15 1809  NA 141 142  K 3.4* 4.1  CL 107 109  CO2 29 26  GLUCOSE 108* 134*  BUN 15 19  CREATININE 1.19 1.44*  CALCIUM 8.9 9.5  MG 2.0  --   AST  --  18  ALT  --  13*  ALKPHOS  --  85  BILITOT  --  0.7   ------------------------------------------------------------------------------------------------------------------  Cardiac Enzymes  Recent Labs Lab 06/23/15 1849  TROPONINI 0.03   ------------------------------------------------------------------------------------------------------------------  RADIOLOGY:  Ct Head Wo Contrast  06/29/2015  CLINICAL DATA:  Confusion and altered mental status for several days. Recent stroke. EXAM: CT HEAD WITHOUT CONTRAST TECHNIQUE: Contiguous axial images were  obtained from the base of the skull through the vertex without  intravenous contrast. COMPARISON:  Brain MRI on 06/24/2015 and CT on 06/23/2015 FINDINGS: Acute infarct again seen with diffuse involvement of the left basal ganglia. There is mild mass effect on the left frontal horn without evidence of midline shift. These findings show no significant change compared to prior studies. Mild chronic small vessel disease again noted. No acute intracranial hemorrhage identified. No evidence hydrocephalus or abnormal extra-axial fluid collections. No skull abnormality identified. IMPRESSION: No significant change in appearance of acute infarct throughout the left basal ganglia. There is mild mass effect on the left frontal horn, without evidence of midline shift. Electronically Signed   By: Earle Gell M.D.   On: 06/29/2015 19:25    EKG:   Orders placed or performed during the hospital encounter of 06/29/15  . ED EKG  . ED EKG  . EKG 12-Lead  . EKG 12-Lead    ASSESSMENT AND PLAN:   Christopher Delacruz is a 65 y.o. male presenting with Altered Mental Status . Admitted 06/29/2015 : Day #: 0 days 1. CVA: Left basal ganglia infarct: Patient was recently admitted to the hospital for the same findings, we'll check urinalysis insertion for other etiology however this is likely continuation of prior symptoms neurology to evaluate patient continue aspirin statin therapy, speech evaluation PT OT 2. Essential hypertension: Norvasc, lisinopril 3. Type 2 diabetes: Insulin sliding scale 4.  All the records are reviewed and case discussed with Care Management/Social Workerr. Management plans discussed with the patient, family and they are in agreement.  CODE STATUS: full TOTAL TIME TAKING CARE OF THIS PATIENT: 28 minutes.   POSSIBLE D/C IN 1-2DAYS, DEPENDING ON CLINICAL CONDITION.   Bethanne Mule,  [name].D on 06/30/2015 at 12:27 PM  Between 7am to 6pm - Pager - [phone]  After 6pm: House Pager: - [phone]  [name]  Office  [phone]  CC: Primary  care physician; No PCP Per Patient

## 2015-06-30 NOTE — H&P (Signed)
Christopher Delacruz is an 65 y.o. male.   Chief Complaint: Altered mental status HPI: This patient with past medical history of diabetes, hypertension and recent left basal ganglia subacute stroke without residual deficits presents to the emergency department with altered mental status and new left-sided weakness. It is very difficult to understand the patient due to garbled speech but he states that this evening he felt some weakness of his right arm and then noticed that his leg was weak. He thought the symptoms may go away and I believe he is telling me that he took a nap although this is difficult to understand. When he awoke or his family tried to awaken him he was difficult to arouse which prompted him to seek evaluation in the emergency department. CT of his head showed new diffuse involvement of the left basal ganglia which prompted the emergency department staff to call for admission.  Past Medical History  Diagnosis Date  . Hypertension   . Hyperlipidemia   . Diabetes mellitus ([place])     No past surgical history on file. Not available as patients speech is garbled. We'll make an effort to call patient's family  No family history on file. Not available as patients speech is garbled. We'll make an effort to call patient's family  Social History:  reports that he has never smoked. He does not have any smokeless tobacco history on file. His alcohol and drug histories are not on file.  Allergies: No Known Allergies  Medications Prior to Admission  Medication Sig Dispense Refill  . amLODipine (NORVASC) 5 MG tablet Take 5 mg by mouth daily.  4  . aspirin 81 MG chewable tablet Chew 1 tablet (81 mg total) by mouth daily. 30 tablet 5  . atorvastatin (LIPITOR) 80 MG tablet Take 80 mg by mouth at bedtime.  10  . insulin aspart (NOVOLOG) 100 UNIT/ML injection Inject 0-15 Units into the skin 3 (three) times daily with meals. 10 mL 11  . insulin aspart (NOVOLOG) 100 UNIT/ML injection Inject 0-5 Units  into the skin at bedtime. 10 mL 11  . lisinopril (PRINIVIL,ZESTRIL) 20 MG tablet Take 1 tablet (20 mg total) by mouth 2 (two) times daily. 60 tablet 5  . ondansetron (ZOFRAN) 4 MG tablet Take 1 tablet (4 mg total) by mouth every 6 (six) hours as needed for nausea. 20 tablet 0  . senna-docusate (SENOKOT-S) 8.6-50 MG tablet Take 1 tablet by mouth at bedtime as needed for mild constipation. 30 tablet 5    Results for orders placed or performed during the hospital encounter of 06/29/15 (from the past 48 hour(s))  Comprehensive metabolic panel     Status: Abnormal   Collection Time: 06/29/15  6:09 PM  Result Value Ref Range   Sodium 142 135 - 145 mmol/L   Potassium 4.1 3.5 - 5.1 mmol/L   Chloride 109 101 - 111 mmol/L   CO2 26 22 - 32 mmol/L   Glucose, Bld 134 (H) 65 - 99 mg/dL   BUN 19 6 - 20 mg/dL   Creatinine, Ser 1.44 (H) 0.61 - 1.24 mg/dL   Calcium 9.5 8.9 - 10.3 mg/dL   Total Protein 7.5 6.5 - 8.1 g/dL   Albumin 4.1 3.5 - 5.0 g/dL   AST 18 15 - 41 U/L   ALT 13 (L) 17 - 63 U/L   Alkaline Phosphatase 85 38 - 126 U/L   Total Bilirubin 0.7 0.3 - 1.2 mg/dL   GFR calc non Af Amer 49 (L) >60  mL/min   GFR calc Af Amer 57 (L) >60 mL/min    Comment: (NOTE) The eGFR has been calculated using the CKD EPI equation. This calculation has not been validated in all clinical situations. eGFR's persistently <60 mL/min signify possible Chronic Kidney Disease.    Anion gap 7 5 - 15  CBC     Status: None   Collection Time: 06/29/15  6:09 PM  Result Value Ref Range   WBC 10.5 3.8 - 10.6 K/uL   RBC 4.91 4.40 - 5.90 MIL/uL   Hemoglobin 13.6 13.0 - 18.0 g/dL   HCT 41.5 40.0 - 52.0 %   MCV 84.5 80.0 - 100.0 fL   MCH 27.7 26.0 - 34.0 pg   MCHC 32.7 32.0 - 36.0 g/dL   RDW 13.5 11.5 - 14.5 %   Platelets 209 150 - 440 K/uL   Ct Head Wo Contrast  06/29/2015  CLINICAL DATA:  Confusion and altered mental status for several days. Recent stroke. EXAM: CT HEAD WITHOUT CONTRAST TECHNIQUE: Contiguous axial  images were obtained from the base of the skull through the vertex without intravenous contrast. COMPARISON:  Brain MRI on 06/24/2015 and CT on 06/23/2015 FINDINGS: Acute infarct again seen with diffuse involvement of the left basal ganglia. There is mild mass effect on the left frontal horn without evidence of midline shift. These findings show no significant change compared to prior studies. Mild chronic small vessel disease again noted. No acute intracranial hemorrhage identified. No evidence hydrocephalus or abnormal extra-axial fluid collections. No skull abnormality identified. IMPRESSION: No significant change in appearance of acute infarct throughout the left basal ganglia. There is mild mass effect on the left frontal horn, without evidence of midline shift. Electronically Signed   By: Earle Gell M.D.   On: 06/29/2015 19:25    Review of Systems  Constitutional: Negative for fever and chills.  HENT: Negative for sore throat and tinnitus.   Eyes: Negative for blurred vision and redness.  Respiratory: Negative for cough and shortness of breath.   Cardiovascular: Negative for chest pain, palpitations, orthopnea and PND.  Gastrointestinal: Negative for nausea, vomiting, abdominal pain and diarrhea.  Genitourinary: Negative for dysuria, urgency and frequency.  Musculoskeletal: Negative for myalgias and joint pain.  Skin: Negative for rash.       No lesions  Neurological: Positive for speech change and focal weakness. Negative for weakness.  Endo/Heme/Allergies: Does not bruise/bleed easily.       No temperature intolerance  Psychiatric/Behavioral: Negative for depression and suicidal ideas.    Blood pressure 140/80, pulse 57, temperature 98 F (36.7 C), temperature source Oral, resp. rate 20, height 6' (1.829 m), weight 116.574 kg (257 lb), SpO2 90 %. Physical Exam  Constitutional: He is oriented to person, place, and time. He appears well-developed and well-nourished. No distress.  HENT:   Head: Normocephalic and atraumatic.  Mouth/Throat: Oropharynx is clear and moist.  Eyes: Conjunctivae and EOM are normal. Pupils are equal, round, and reactive to light. No scleral icterus.  Neck: Normal range of motion. Neck supple. No JVD present. No tracheal deviation present. No thyromegaly present.  Cardiovascular: Normal rate and regular rhythm.  Exam reveals no gallop and no friction rub.   No murmur heard. Respiratory: Effort normal and breath sounds normal. No respiratory distress. He has no wheezes.  GI: Soft. Bowel sounds are normal. He exhibits no distension. There is no tenderness.  Genitourinary:  Deferred  Musculoskeletal: Normal range of motion. He exhibits no edema.  Lymphadenopathy:  He has no cervical adenopathy.  Neurological: He is alert and oriented to person, place, and time. GCS eye subscore is 4. GCS verbal subscore is 5. GCS motor subscore is 6.  Reflex Scores:      Patellar reflexes are 1+ on the right side and 1+ on the left side. 3/5 strength left upper and lower extremity   Skin: Skin is warm and dry. No rash noted. No erythema.  Psychiatric: He has a normal mood and affect. His behavior is normal. Judgment and thought content normal.     Assessment/Plan This is a 65 year old male admitted for new left basal ganglia CVA. 1. CVA: Left basal ganglia infarct with extensive edema. Progressive hypertension. Subcutaneous heparin for DVT prophylaxis (no full anticoagulant due to internal capsule involvement to my: Also no thrombus seen on echocardiogram 6 days ago). Neurology consult ordered. Monitor NIH stroke scale. Continue aspirin 2. Essential hypertension: Continue amlodipine and lisinopril per home regimen 3. Diabetes mellitus type 2: Refill insulin while hospitalized 4. Hyperlipidemia: Continue Lipitor 5. DVT prophylaxis: Heparin 6. GI prophylaxis: None The patient is a full code. Time spent on admission orders and patient care approximately 45  minutes  Harrie Foreman, MD 06/30/2015, 2:43 AM

## 2015-06-30 NOTE — Progress Notes (Addendum)
Per patient ok to discussed his status today with brother Purvis Sheffield by Sydnee Cabal, and niece Sandler Blodgett

## 2015-06-30 NOTE — Evaluation (Signed)
Clinical/Bedside Swallow Evaluation Patient Details  Name: Christopher Delacruz MRN: [id number] Date of Birth: Aug 05, 1950  Today's Date: 06/30/2015 Time: SLP Start Time (ACUTE ONLY): 1340 SLP Stop Time (ACUTE ONLY): 1419 SLP Time Calculation (min) (ACUTE ONLY): 39 min  Past Medical History:  Past Medical History  Diagnosis Date  . Hypertension   . Hyperlipidemia   . Diabetes mellitus ([place])    Past Surgical History: History reviewed. No pertinent past surgical history. HPI:  Pt is a 65 year old African-American male with past medical history significant for diabetes, essential hypertension, who presents to the hospital with complaints of right weakness, disorientation, slurred speech, difficulty expressing himself. Pt was recently discharged from [facility]. 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 Pt was admitted again with altered menatl status.   Assessment / Plan / Recommendation Clinical Impression  Pt presents with functional swallowing at bedside with no overt s/s of aspiration. Oral mech exam revealed structures to be functioning adequately. Oral manipulation of soft and solid boluses was adequate with min oral residue after the swallow.Laryngeal levation appeared adequate, vocal quality remained clear throughout assessment. Noted speech was difficulty to understand at times with some word distortions particularily during connected speech. Pt was able to follow commands and my converstaion but occasionally needed repetition of directions.Pt reprost he is enjoying the soft diet, therefore will continue with this consistency. ST to follow up with toleration of diet and proceed with speech and language eval next visit. Red OP ST eval and tx if Pt is discharged prior to next ST visit.    Aspiration Risk  Mild aspiration risk    Diet Recommendation   Regular or continue with soft as Pt enjoys this diet  Medication  Administration: Whole meds with liquid    Other  Recommendations     Follow up Recommendations  Outpatient SLP    Frequency and Duration min 2x/week  1 week       Prognosis Prognosis for Safe Diet Advancement: Good      Swallow Study   General Date of Onset: 06/29/15 HPI: Pt is a 65 year old African-American male with past medical history significant for diabetes, essential hypertension, who presents to the hospital with complaints of right weakness, disorientation, slurred speech, difficulty expressing himself. Pt was recently discharged from [facility]. 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 Pt was admitted again with altered menatl status. Type of Study: Bedside Swallow Evaluation Diet Prior to this Study: Regular Temperature Spikes Noted: No Respiratory Status: Room air History of Recent Intubation: No Behavior/Cognition: Alert;Cooperative;Pleasant mood Oral Cavity Assessment: Within Functional Limits Oral Care Completed by SLP: No Oral Cavity - Dentition: Dentures, top;Dentures, bottom Vision: Functional for self-feeding Self-Feeding Abilities: Able to feed self Patient Positioning: Upright in bed Baseline Vocal Quality: Normal Volitional Cough: Strong Volitional Swallow: Able to elicit    Oral/Motor/Sensory Function Overall Oral Motor/Sensory Function: Within functional limits   Ice Chips Ice chips: Within functional limits Presentation: Cup   Thin Liquid Thin Liquid: Within functional limits Presentation: Cup;Spoon;Straw    Nectar Thick Nectar Thick Liquid: Not tested   Honey Thick     Puree Puree: Within functional limits   Solid   GO   Solid: Within functional limits Presentation: [place] 06/30/2015,2:29 PM

## 2015-06-30 NOTE — ED Notes (Signed)
SaO2=85-86% on RA while sleeping. Dr Jacqualine Code notified.  O2 2L to maintain SAO2>92% ordered and implemented.

## 2015-06-30 NOTE — Progress Notes (Signed)
Discussed patient status and plan of care with family - joe and sandy - phone numbers updated Planning on pt/ot/speech/likely placement

## 2015-06-30 NOTE — Consult Note (Signed)
CC: AMS   HPI: Christopher Delacruz is an 65 y.o. male with recent d/c from [facility] and seen by me for L BG strokes. Admitted for AMS due to his friends unable to contact him for the past 2 days. At baseline pt is suprisingly independent and lives along.    Past Medical History  Diagnosis Date  . Hypertension   . Hyperlipidemia   . Diabetes mellitus ([place])     History reviewed. No pertinent past surgical history.  History reviewed. No pertinent family history.  Social History:  reports that he has never smoked. He does not have any smokeless tobacco history on file. His alcohol and drug histories are not on file.  No Known Allergies  Medications: I have reviewed the patient's current medications.  ROS: Unable due to confusion   Physical Examination: Blood pressure 147/84, pulse 59, temperature 98.1 F (36.7 C), temperature source Oral, resp. rate 18, height 6\' 1"  (1.854 m), weight 125.828 kg (277 lb 6.4 oz), SpO2 93 %.   Neurological Examination Mental Status: Alert, oriented, thought content appropriate.  Speech fluent without evidence of aphasia.  Able to follow 3 step commands without difficulty. Cranial Nerves: II: Discs flat bilaterally; Visual fields grossly normal, pupils equal, round, reactive to light and accommodation III,IV, VI: ptosis not present, extra-ocular motions intact bilaterally V,VII: smile symmetric, facial light touch sensation normal bilaterally VIII: hearing normal bilaterally IX,X: gag reflex present XI: bilateral shoulder shrug XII: midline tongue extension Motor: Generalized weakness 4+/5 b/l upper extremity and 4/5 b/l LE Tone and bulk:normal tone throughout; no atrophy noted Sensory: Pinprick and light touch intact throughout, bilaterally Deep Tendon Reflexes: 1+ and symmetric throughout Plantars: Right: downgoing   Left: downgoing Cerebellar: normal finger-to-nose, normal rapid alternating movements and normal heel-to-shin test Gait: not tested.         Laboratory Studies:   Basic Metabolic Panel:  Recent Labs Lab 06/23/15 1849 06/24/15 0339 06/25/15 0520 06/29/15 1809  NA 142 143 141 142  K 3.4* 3.5 3.4* 4.1  CL 106 109 107 109  CO2 26 27 29 26   GLUCOSE 137* 114* 108* 134*  BUN 19 16 15 19   CREATININE 1.53* 1.36* 1.19 1.44*  CALCIUM 9.3 9.0 8.9 9.5  MG  --   --  2.0  --     Liver Function Tests:  Recent Labs Lab 06/23/15 1849 06/29/15 1809  AST 43* 18  ALT 16* 13*  ALKPHOS 80 85  BILITOT 0.9 0.7  PROT 7.5 7.5  ALBUMIN 4.0 4.1   No results for input(s): LIPASE, AMYLASE in the last 168 hours. No results for input(s): AMMONIA in the last 168 hours.  CBC:  Recent Labs Lab 06/23/15 1849 06/24/15 0339 06/29/15 1809  WBC 11.6* 10.0 10.5  NEUTROABS 8.4*  --   --   HGB 13.9 13.7 13.6  HCT 43.7 42.2 41.5  MCV 86.0 86.0 84.5  PLT 215 180 209    Cardiac Enzymes:  Recent Labs Lab 06/23/15 1849  CKTOTAL 2358*  TROPONINI 0.03    BNP: Invalid input(s): POCBNP  CBG:  Recent Labs Lab 06/26/15 2133 06/27/15 0742 06/27/15 1144 06/30/15 0805 06/30/15 1128  GLUCAP 175* 121* 159* 120* 190*    Microbiology: Results for orders placed or performed during the hospital encounter of 06/23/15  MRSA PCR Screening     Status: None   Collection Time: 06/24/15  2:06 AM  Result Value Ref Range Status   MRSA by PCR NEGATIVE NEGATIVE Final    Comment:  The GeneXpert MRSA Assay (FDA approved for NASAL specimens only), is one component of a comprehensive MRSA colonization surveillance program. It is not intended to diagnose MRSA infection nor to guide or monitor treatment for MRSA infections.     Coagulation Studies: No results for input(s): LABPROT, INR in the last 72 hours.  Urinalysis:  Recent Labs Lab 06/23/15 1955 06/30/15 0810  COLORURINE YELLOW* YELLOW*  LABSPEC 1.025 1.023  PHURINE 5.0 5.0  GLUCOSEU NEGATIVE 50*  HGBUR 1+* NEGATIVE  BILIRUBINUR NEGATIVE NEGATIVE   KETONESUR TRACE* NEGATIVE  PROTEINUR >500* >500*  NITRITE NEGATIVE NEGATIVE  LEUKOCYTESUR NEGATIVE NEGATIVE    Lipid Panel:     Component Value Date/Time   CHOL 131 06/24/2015 0339   TRIG 66 06/24/2015 0339   HDL 34* 06/24/2015 0339   CHOLHDL 3.9 06/24/2015 0339   VLDL 13 06/24/2015 0339   LDLCALC 84 06/24/2015 0339    HgbA1C:  Lab Results  Component Value Date   HGBA1C 5.8 06/24/2015    Urine Drug Screen:  No results found for: LABOPIA, COCAINSCRNUR, [place], AMPHETMU, THCU, LABBARB  Alcohol Level:  Recent Labs Lab 06/23/15 [place] <5    Other results: EKG: normal EKG, normal sinus rhythm, unchanged from previous tracings.  Imaging: Ct Head Wo Contrast  06/29/2015  CLINICAL DATA:  Confusion and altered mental status for several days. Recent stroke. EXAM: CT HEAD WITHOUT CONTRAST TECHNIQUE: Contiguous axial images were obtained from the base of the skull through the vertex without intravenous contrast. COMPARISON:  Brain MRI on 06/24/2015 and CT on 06/23/2015 FINDINGS: Acute infarct again seen with diffuse involvement of the left basal ganglia. There is mild mass effect on the left frontal horn without evidence of midline shift. These findings show no significant change compared to prior studies. Mild chronic small vessel disease again noted. No acute intracranial hemorrhage identified. No evidence hydrocephalus or abnormal extra-axial fluid collections. No skull abnormality identified. IMPRESSION: No significant change in appearance of acute infarct throughout the left basal ganglia. There is mild mass effect on the left frontal horn, without evidence of midline shift. Electronically Signed   By: Earle Gell M.D.   On: 06/29/2015 19:25     Assessment/Plan:   65 y.o. male with recent d/c from [facility] and seen by me for L BG strokes. Admitted for AMS due to his friends unable to contact him for the past 2 days. At baseline pt is suprisingly independent and lives along.     CTH reviewed showing same L BG stroke as on prior admission.    - I am not convinced of new ischemia - Con't anti platelet therapy - pt/ot - I would have social work/case management evaluation to see if pt is safe to live alone at home or needs supervision. [name]    06/30/2015, 1:52 PM

## 2015-07-01 LAB — GLUCOSE, CAPILLARY
Glucose-Capillary: 113 mg/dL — ABNORMAL HIGH (ref 65–99)
Glucose-Capillary: 146 mg/dL — ABNORMAL HIGH (ref 65–99)
Glucose-Capillary: 133 mg/dL — ABNORMAL HIGH (ref 65–99)
Glucose-Capillary: 115 mg/dL — ABNORMAL HIGH (ref 65–99)

## 2015-07-01 LAB — HEMOGLOBIN A1C: Hgb A1c MFr Bld: 5.9 % (ref 4.0–6.0)

## 2015-07-01 NOTE — Progress Notes (Signed)
[facility] [place] NAME: Christopher Delacruz    MRN#:  [id number]  DATE OF BIRTH:  1950/06/13  SUBJECTIVE:  Hospital Day: 1 day Christopher Delacruz is a 65 y.o. male presenting with Altered Mental Status .   Overnight events: No overnight events Interval Events: Poor historian, no complaints at this time REVIEW OF SYSTEMS:  CONSTITUTIONAL: No fever, fatigue And positive weakness.  EYES: No blurred or double vision.  EARS, NOSE, AND THROAT: No tinnitus or ear pain.  RESPIRATORY: No cough, shortness of breath, wheezing or hemoptysis.  CARDIOVASCULAR: No chest pain, orthopnea, edema.  GASTROINTESTINAL: No nausea, vomiting, diarrhea or abdominal pain.  GENITOURINARY: No dysuria, hematuria.  ENDOCRINE: No polyuria, nocturia,  HEMATOLOGY: No anemia, easy bruising or bleeding SKIN: No rash or lesion. MUSCULOSKELETAL: No joint pain or arthritis.   NEUROLOGIC: No tingling, numbness, positive weakness.  PSYCHIATRY: No anxiety or depression.   DRUG ALLERGIES:  No Known Allergies  VITALS:  Blood pressure 151/71, pulse 61, temperature 97.6 F (36.4 C), temperature source Oral, resp. rate 18, height 6\' 1"  (1.854 m), weight 277 lb 6.4 oz (125.828 kg), SpO2 94 %.  PHYSICAL EXAMINATION:  VITAL SIGNS: Filed Vitals:   07/01/15 0505 07/01/15 0927  BP: 134/72 151/71  Pulse: 51 61  Temp: 97.6 F (36.4 C)   Resp: 16 18   GENERAL:65 y.o.male currently in no acute distress.  HEAD: Normocephalic, atraumatic.  EYES: Pupils equal, round, reactive to light. Extraocular muscles intact. No scleral icterus.  MOUTH: Moist mucosal membrane. Dentition intact. No abscess noted.  EAR, NOSE, THROAT: Clear without exudates. No external lesions.  NECK: Supple. No thyromegaly. No nodules. No JVD.  PULMONARY: Clear to ascultation, without wheeze rails or rhonci. No use of accessory muscles, Good respiratory effort. good air entry bilaterally CHEST: Nontender to  palpation.  CARDIOVASCULAR: S1 and S2. Regular rate and rhythm. No murmurs, rubs, or gallops. No edema. Pedal pulses 2+ bilaterally.  GASTROINTESTINAL: Soft, nontender, nondistended. No masses. Positive bowel sounds. No hepatosplenomegaly.  MUSCULOSKELETAL: No swelling, clubbing, or edema. Range of motion full in all extremities.  NEUROLOGIC: Difficult to fully assess given patient's mental status, appears to grossly have full range of motion however strength testing right lower extremity proximal 4/5 remainder of exam within normal limits, expressive aphasia otherwise Cranial nerves II through XII are intact. Sensation intact. Reflexes intact.  SKIN: No ulceration, lesions, rashes, or cyanosis. Skin warm and dry. Turgor intact.  PSYCHIATRIC: Mood, affect within normal limits. The patient is awake, alert and oriented x 3. Insight, judgment intact.      LABORATORY PANEL:   CBC  Recent Labs Lab 06/29/15 1809  WBC 10.5  HGB 13.6  HCT 41.5  PLT 209   ------------------------------------------------------------------------------------------------------------------  Chemistries   Recent Labs Lab 06/25/15 0520 06/29/15 1809  NA 141 142  K 3.4* 4.1  CL 107 109  CO2 29 26  GLUCOSE 108* 134*  BUN 15 19  CREATININE 1.19 1.44*  CALCIUM 8.9 9.5  MG 2.0  --   AST  --  18  ALT  --  13*  ALKPHOS  --  85  BILITOT  --  0.7   ------------------------------------------------------------------------------------------------------------------  Cardiac Enzymes No results for input(s): TROPONINI in the last 168 hours. ------------------------------------------------------------------------------------------------------------------  RADIOLOGY:  Ct Head Wo Contrast  06/29/2015  CLINICAL DATA:  Confusion and altered mental status for several days. Recent stroke. EXAM: CT HEAD WITHOUT CONTRAST TECHNIQUE: Contiguous axial images were obtained from the  base of the skull through the vertex  without intravenous contrast. COMPARISON:  Brain MRI on 06/24/2015 and CT on 06/23/2015 FINDINGS: Acute infarct again seen with diffuse involvement of the left basal ganglia. There is mild mass effect on the left frontal horn without evidence of midline shift. These findings show no significant change compared to prior studies. Mild chronic small vessel disease again noted. No acute intracranial hemorrhage identified. No evidence hydrocephalus or abnormal extra-axial fluid collections. No skull abnormality identified. IMPRESSION: No significant change in appearance of acute infarct throughout the left basal ganglia. There is mild mass effect on the left frontal horn, without evidence of midline shift. Electronically Signed   By: Earle Gell M.D.   On: 06/29/2015 19:25    EKG:   Orders placed or performed during the hospital encounter of 06/29/15  . ED EKG  . ED EKG  . EKG 12-Lead  . EKG 12-Lead    ASSESSMENT AND PLAN:   Christopher Delacruz is a 65 y.o. male presenting with Altered Mental Status . Admitted 06/29/2015 : Day #: 1 day 1. CVA: Left basal ganglia infarct: Nephrology input appreciated, continue with current medications 2. Essential hypertension: Norvasc, lisinopril 3. Type 2 diabetes: Insulin sliding scale Disposition: SNF  All the records are reviewed and case discussed with Care Management/Social Workerr. Management plans discussed with the patient, family and they are in agreement.  CODE STATUS: full TOTAL TIME TAKING CARE OF THIS PATIENT: 28 minutes.   POSSIBLE D/C IN 1-2DAYS, DEPENDING ON CLINICAL CONDITION.   Christopher Delacruz,  [name].D on 07/01/2015 at 11:49 AM  Between 7am to 6pm - Pager - [phone]  After 6pm: House Pager: - [phone]  [name]  Office  [phone]  CC: Primary care physician; No PCP Per Patient

## 2015-07-01 NOTE — Evaluation (Signed)
Physical Therapy Evaluation Patient Details Name: Christopher Delacruz MRN: [id number] DOB: 06/21/1950 Today's Date: 07/01/2015   History of Present Illness  65 year old African-American male with past medical history significant for diabetes, essential hypertension, who presents to the hospital with complaints of right weakness, disorientation, slurred speech, difficulty expressing himself. Pt was recently discharged from [facility]. 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 Pt was admitted again with altered menatl status.   Clinical Impression  Pt is able to ambulate with a SPC with inconsistent and at times unstable/impulsive.  He appears confident at times and at others seems to realize he is more limited than he thought.  Pt did not have any overt LOBs with ambulation, but staggered to the R multiple times and did need close supervision and a lot of cuing.  Pt with some minimal continued R LE weakness but overall is better than when CVA was acute last week. Pt is likely unsafe at home and would benefit from some time in rehab to increase safety/awareness/overall function.    Follow Up Recommendations SNF    Equipment Recommendations       Recommendations for Other Services       Precautions / Restrictions Precautions Precautions: Fall Restrictions Weight Bearing Restrictions: No      Mobility  Bed Mobility Overal bed mobility: Modified Independent Bed Mobility: Supine to Sit;Sit to Supine     Supine to sit: Supervision Sit to supine: Supervision   General bed mobility comments: Pt is able to get to/from supine w/o direct assist.  Transfers Overall transfer level: Modified independent Equipment used: Straight cane Transfers: Sit to/from Stand           General transfer comment: Pt is able to rise from bed needing 2 attempts.  First time he tried w/o much UE assist and was unsuccessful, on the second attempt  he was able to rise with cane and only CGA  Ambulation/Gait Ambulation/Gait assistance: Min guard Ambulation Distance (Feet): 150 Feet Assistive device: Straight cane       General Gait Details: Pt has occasional stagger steps and needs to use wall a few times to stabilize.  Pt is not quick to react to cues and though he displays confidence he has a few moments where he was impulsive and relatively unsafe  Stairs Stairs: Yes Stairs assistance: Min assist   Number of Stairs: 5 General stair comments: Pt struggled with steps.  He reports he does not have rails at home and he was not at all confident (or safe) with just using the cane.  He was able to use step-to pattern with L up and R down but when not directly cued he would forget and get stuck not being able to complete the step trying to go reciprocally.    Wheelchair Mobility    Modified Rankin (Stroke Patients Only)       Balance Overall balance assessment: Needs assistance   Sitting balance-Leahy Scale: Good       Standing balance-Leahy Scale: Fair Standing balance comment: Pt is inconsistent with standing balance with and w/o UE support.  He at times looked very stable and safe, and at times he was impulsive, confused or unstable.                             Pertinent Vitals/Pain Pain Assessment: No/denies pain    Home Living Family/patient expects to  be discharged to:: Skilled nursing facility Living Arrangements: Alone Available Help at Discharge:  (pt indicates he does not have a lot of help) Type of Home: House                Prior Function Level of Independence: Independent         Comments: prior to CVA last month pt was independent and ability do all he needs     Hand Dominance        Extremity/Trunk Assessment   Upper Extremity Assessment: Overall WFL for tasks assessed RUE Deficits / Details: still having some R sided weakness and coordination issues.  R UE only minimally  weaker than L.         Lower Extremity Assessment: RLE deficits/detail RLE Deficits / Details: R LE grossly 4-/5       Communication   Communication: Expressive difficulties (speech is clearer today than acutely after stroke)  Cognition Arousal/Alertness: Awake/alert Behavior During Therapy: WFL for tasks assessed/performed Overall Cognitive Status: Impaired/Different from baseline                      General Comments      Exercises        Assessment/Plan    PT Assessment Patient needs continued PT services  PT Diagnosis Difficulty walking;Generalized weakness;Hemiplegia dominant side   PT Problem List Decreased strength;Decreased range of motion;Decreased activity tolerance;Decreased balance;Decreased mobility;Decreased coordination;Decreased cognition;Decreased knowledge of use of DME;Decreased safety awareness  PT Treatment Interventions Gait training;DME instruction;Functional mobility training;Therapeutic activities;Therapeutic exercise;Balance training;Neuromuscular re-education;Stair training   PT Goals (Current goals can be found in the Care Plan section) Acute Rehab PT Goals Patient Stated Goal: get stronger and safer at rehab PT Goal Formulation: With patient Time For Goal Achievement: 07/15/15 Potential to Achieve Goals: Fair    Frequency 7X/week   Barriers to discharge   Pt lives alone and does not have a lot of assist available.  Unable to drive, poor safety and situational awareness.    Co-evaluation               End of Session Equipment Utilized During Treatment: Gait belt Activity Tolerance: Patient tolerated treatment well Patient left: with bed alarm set;with call bell/phone within reach           Time: [id number] PT Time Calculation (min) (ACUTE ONLY): 22 min   Charges:   PT Evaluation $PT Eval Low Complexity: 1 Procedure     PT G Codes:        Kreg Shropshire, DPT 07/01/2015, 11:12 AM

## 2015-07-02 LAB — CBC
HCT: 39.7 % — ABNORMAL LOW (ref 40.0–52.0)
Hemoglobin: 12.8 g/dL — ABNORMAL LOW (ref 13.0–18.0)
MCH: 27.8 pg (ref 26.0–34.0)
MCHC: 32.2 g/dL (ref 32.0–36.0)
MCV: 86.2 fL (ref 80.0–100.0)
Platelets: 204 10*3/uL (ref 150–440)
RBC: 4.6 MIL/uL (ref 4.40–5.90)
RDW: 13.3 % (ref 11.5–14.5)
WBC: 9.2 10*3/uL (ref 3.8–10.6)

## 2015-07-02 LAB — GLUCOSE, CAPILLARY
Glucose-Capillary: 108 mg/dL — ABNORMAL HIGH (ref 65–99)
Glucose-Capillary: 116 mg/dL — ABNORMAL HIGH (ref 65–99)
Glucose-Capillary: 142 mg/dL — ABNORMAL HIGH (ref 65–99)
Glucose-Capillary: 122 mg/dL — ABNORMAL HIGH (ref 65–99)

## 2015-07-02 MED ORDER — ENOXAPARIN SODIUM 40 MG/0.4ML ~~LOC~~ SOLN
40.0000 mg | SUBCUTANEOUS | Status: DC
  Administered 2015-07-02 – 2015-07-03 (×2): 40 mg via SUBCUTANEOUS
  Filled 2015-07-02: qty 0.4

## 2015-07-02 MED ORDER — ENOXAPARIN SODIUM 40 MG/0.4ML ~~LOC~~ SOLN
SUBCUTANEOUS | Status: AC
  Filled 2015-07-02: qty 0.4

## 2015-07-02 NOTE — Progress Notes (Signed)
[facility]  Patient Status: Active  [facility] is providing the following services: SN/PT/ST  If patient discharges after hours, please call [phone].   Christopher Delacruz 07/02/2015, 11:51 AM

## 2015-07-02 NOTE — Care Management Important Message (Signed)
Important Message  Patient Details  Name: Elvin Giovino MRN: [id number] Date of Birth: 1950/02/11   Medicare Important Message Given:  Yes    Shelbie Ammons, RN 07/02/2015, 8:52 AM

## 2015-07-02 NOTE — Evaluation (Signed)
Occupational Therapy Evaluation Patient Details Name: Kaid Spohr MRN: [id number] DOB: [birth year] Today's Date: 07/02/2015    History of Present Illness 65 year old African-American male with past medical history significant for diabetes, essential hypertension, who presents to the hospital with complaints of right weakness, disorientation, slurred speech, difficulty expressing himself. Pt was recently discharged from [facility]. 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 Pt was admitted again with altered menatl status.    Clinical Impression   Pt. Is a 65 y.o. Male who was admitted  With slurred speech, difficulty expressing himself, and Right UE weakness. Pt. Presents with weakness, decreased RUE strength, coordination, hand function, diminished sensation, and proprioceptive awareness, and impaired balance which hinders his ability to complete ADL tasks. Pt. Could benefit from skilled OT services for ADL and A/E training, UE ther. Ex, neuromuscular re-ed, and pt. Ed. In order to improve ADL functioining.    Follow Up Recommendations  SNF    Equipment Recommendations  None recommended by OT    Recommendations for Other Services       Precautions / Restrictions Precautions Precautions: Fall Restrictions Weight Bearing Restrictions: No      Mobility Bed Mobility                  Transfers                      Balance     Sitting balance-Leahy Scale: Good       Standing balance-Leahy Scale: Fair                              ADL Overall ADL's : Needs assistance/impaired Eating/Feeding: Set up;Independent   Grooming: Set up;Minimal assistance               Lower Body Dressing: Minimal assistance               Functional mobility during ADLs: Minimal assistance       Vision Vision Assessment?: No apparent visual deficits   Perception     Praxis       Pertinent Vitals/Pain       Hand Dominance Right   Extremity/Trunk Assessment Upper Extremity Assessment Upper Extremity Assessment: RUE deficits/detail;Generalized weakness;Overall [facility] for tasks assessed RUE Deficits / Details: RUE shoulder 4-/5 abduction/flexion, 4/5 elbow flexion/extension, wrist flexion/extension. Grip strength: Right: 35#, Left : 35# RUE Sensation: decreased light touch;decreased proprioception RUE Coordination: decreased fine motor           Communication Communication Communication: Expressive difficulties   Cognition Arousal/Alertness: Awake/alert Behavior During Therapy: WFL for tasks assessed/performed Overall Cognitive Status: Impaired/Different from baseline                     General Comments       Exercises       Shoulder Instructions      Home Living Family/patient expects to be discharged to:: Skilled nursing facility Living Arrangements: Alone     Home Access: Stairs to enter Entrance Stairs-Number of Steps: 5 in the back   Home Layout: One level     Bathroom Shower/Tub: Walk-in Hydrologist: Standard Bathroom Accessibility: Yes          Lives With: Alone    Prior Functioning/Environment Level of Independence: Independent        Comments: Prior to CVA last  month pt was independent and ability do all he needs    OT Diagnosis:     OT Problem List:     OT Treatment/Interventions: Self-care/ADL training;Neuromuscular education;Energy conservation;DME and/or AE instruction;Patient/family education;Cognitive remediation/compensation    OT Goals(Current goals can be found in the care plan section)    OT Frequency: Min 1X/week   Barriers to D/C:            Co-evaluation              End of Session Equipment Utilized During Treatment: Gait belt  Activity Tolerance: Patient tolerated treatment well Patient left: in bed;with call bell/phone within reach;with bed alarm set    Time: 1007-1030 OT Time Calculation (min): 23 min Charges:  OT General Charges $OT Visit: 1 Procedure OT Evaluation $OT Eval Moderate Complexity: 1 Procedure G-Codes:    Harrel Carina, MS, OTR/L Harrel Carina 07/02/2015, 11:20 AM

## 2015-07-02 NOTE — Progress Notes (Signed)
[name] at [place] NAME: Christopher Delacruz    MRN#:  [id number]  DATE OF BIRTH:  1950-09-03  SUBJECTIVE:  Hospital Day: 2 days Christopher Delacruz is a 65 y.o. male presenting with Altered Mental Status .   Overnight events: No overnight events Interval Events: Poor historian, complaints right knee pain - more alert today REVIEW OF SYSTEMS:  CONSTITUTIONAL: No fever, fatigue And positive weakness.  EYES: No blurred or double vision.  EARS, NOSE, AND THROAT: No tinnitus or ear pain.  RESPIRATORY: No cough, shortness of breath, wheezing or hemoptysis.  CARDIOVASCULAR: No chest pain, orthopnea, edema.  GASTROINTESTINAL: No nausea, vomiting, diarrhea or abdominal pain.  GENITOURINARY: No dysuria, hematuria.  ENDOCRINE: No polyuria, nocturia,  HEMATOLOGY: No anemia, easy bruising or bleeding SKIN: No rash or lesion. MUSCULOSKELETAL:right knee pain, otherwise No joint pain or arthritis.   NEUROLOGIC: No tingling, numbness, positive weakness.  PSYCHIATRY: No anxiety or depression.   DRUG ALLERGIES:  No Known Allergies  VITALS:  Blood pressure 146/73, pulse 59, temperature 98 F (36.7 C), temperature source Oral, resp. rate 18, height 6\' 1"  (1.854 m), weight 255 lb 4.8 oz (115.803 kg), SpO2 94 %.  PHYSICAL EXAMINATION:  VITAL SIGNS: Filed Vitals:   07/02/15 1006 07/02/15 1135  BP: 146/73   Pulse: 55 59  Temp:    Resp:     GENERAL:65 y.o.male currently in no acute distress.  HEAD: Normocephalic, atraumatic.  EYES: Pupils equal, round, reactive to light. Extraocular muscles intact. No scleral icterus.  MOUTH: Moist mucosal membrane. Dentition intact. No abscess noted.  EAR, NOSE, THROAT: Clear without exudates. No external lesions.  NECK: Supple. No thyromegaly. No nodules. No JVD.  PULMONARY: Clear to ascultation, without wheeze rails or rhonci. No use of accessory muscles, Good respiratory effort. good air entry bilaterally CHEST:  Nontender to palpation.  CARDIOVASCULAR: S1 and S2. Regular rate and rhythm. No murmurs, rubs, or gallops. No edema. Pedal pulses 2+ bilaterally.  GASTROINTESTINAL: Soft, nontender, nondistended. No masses. Positive bowel sounds. No hepatosplenomegaly.  MUSCULOSKELETAL: No swelling, clubbing, or edema. Range of motion full in all extremities.  NEUROLOGIC: Difficult to fully assess given patient's mental status, appears to grossly have full range of motion however strength testing right lower extremity proximal 4/5 remainder of exam within normal limits, expressive aphasia actually somewhat improved otherwise Cranial nerves II through XII are intact. Sensation intact. Reflexes intact.  SKIN: No ulceration, lesions, rashes, or cyanosis. Skin warm and dry. Turgor intact.  PSYCHIATRIC: Mood, affect within normal limits. The patient is awake, alert and oriented x 3. Insight, judgment intact.      LABORATORY PANEL:   CBC  Recent Labs Lab 07/02/15 0446  WBC 9.2  HGB 12.8*  HCT 39.7*  PLT 204   ------------------------------------------------------------------------------------------------------------------  Chemistries   Recent Labs Lab 06/29/15 1809  NA 142  K 4.1  CL 109  CO2 26  GLUCOSE 134*  BUN 19  CREATININE 1.44*  CALCIUM 9.5  AST 18  ALT 13*  ALKPHOS 85  BILITOT 0.7   ------------------------------------------------------------------------------------------------------------------  Cardiac Enzymes No results for input(s): TROPONINI in the last 168 hours. ------------------------------------------------------------------------------------------------------------------  RADIOLOGY:  No results found.  EKG:   Orders placed or performed during the hospital encounter of 06/29/15  . ED EKG  . ED EKG  . EKG 12-Lead  . EKG 12-Lead    ASSESSMENT AND PLAN:   Bb Christopher Delacruz is a 65 y.o. male presenting with Altered Mental Status . Admitted  06/29/2015 : Day #: 2  days 1. CVA: Left basal ganglia infarct: Nephrology input appreciated, continue with current medications 2. Essential hypertension: Norvasc, lisinopril 3. Type 2 diabetes: Insulin sliding scale Disposition: SNF  All the records are reviewed and case discussed with Care Management/Social Workerr. Management plans discussed with the patient, family and they are in agreement.  CODE STATUS: full TOTAL TIME TAKING CARE OF THIS PATIENT: 28 minutes.   POSSIBLE D/C IN 1-2DAYS, DEPENDING ON CLINICAL CONDITION.   Christopher Delacruz,  Christopher Delacruz on 07/02/2015 at 1:25 PM  Between 7am to 6pm - Pager - [phone]  After 6pm: House Pager: - [phone]  [facility]  Office  [phone]  CC: Primary care physician; No PCP Per Patient

## 2015-07-02 NOTE — Progress Notes (Signed)
Physical Therapy Treatment Patient Details Name: Meng Mendizabal MRN: [id number] DOB: [birth year] Today's Date: 07/02/2015    History of Present Illness 65 year old African-American male with past medical history significant for diabetes, essential hypertension, who presents to the hospital with complaints of right weakness, disorientation, slurred speech, difficulty expressing himself. Pt was recently discharged from [facility]. 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 Pt was admitted again with altered menatl status.     PT Comments    Pt agreeable to PT with encouragement. Pt demonstrates improved balance with ambulation with rolling walker. Pt does wish to ambulate to bathroom without device and is noted to use furniture and wall walking to maintain balance. Discussed safety issue with pt and encouraged rolling walker use at this time.   Follow Up Recommendations  SNF     Equipment Recommendations  Rolling walker with 5" wheels    Recommendations for Other Services       Precautions / Restrictions Precautions Precautions: Fall Restrictions Weight Bearing Restrictions: No    Mobility  Bed Mobility Overal bed mobility: Modified Independent             General bed mobility comments: Mild increased time; use of rail  Transfers Overall transfer level: Needs assistance Equipment used: Rolling walker (2 wheeled) Transfers: Sit to/from Stand Sit to Stand: Supervision         General transfer comment: Pt takes several attempts to stand, but ultimately stands without physical assist  Ambulation/Gait Ambulation/Gait assistance: Min guard Ambulation Distance (Feet): 175 Feet (to/from bathroom without AD but furniture walks, Later 75' ) Assistive device: Rolling walker (2 wheeled) Gait Pattern/deviations: Step-through pattern Gait velocity: decreased   General Gait Details: No LOB; mildly drifts left;  needing verbal correction   Stairs            Wheelchair Mobility    Modified Rankin (Stroke Patients Only)       Balance Overall balance assessment: Needs assistance Sitting-balance support: Feet supported Sitting balance-Leahy Scale: Good     Standing balance support: Bilateral upper extremity supported Standing balance-Leahy Scale: Fair                      Cognition Arousal/Alertness: Awake/alert Behavior During Therapy: WFL for tasks assessed/performed Overall Cognitive Status: Impaired/Different from baseline                      Exercises      General Comments        Pertinent Vitals/Pain      Home Living Family/patient expects to be discharged to:: Skilled nursing facility Living Arrangements: Alone     Home Access: Stairs to enter   Home Layout: One level        Prior Function Level of Independence: Independent      Comments: Prior to CVA last month pt was independent and ability do all he needs   PT Goals (current goals can now be found in the care plan section) Progress towards PT goals: Progressing toward goals    Frequency  7X/week    PT Plan Current plan remains appropriate    Co-evaluation             End of Session Equipment Utilized During Treatment: Gait belt Activity Tolerance: Patient tolerated treatment well Patient left: in chair;with call bell/phone within reach;with chair alarm set     Time: 1135-1200 PT Time Calculation (min) (  ACUTE ONLY): 25 min  Charges:  $Gait Training: 23-37 mins                    G Codes:      Charlaine Dalton, PTA  07/02/2015, 1:06 PM

## 2015-07-03 LAB — GLUCOSE, CAPILLARY
Glucose-Capillary: 122 mg/dL — ABNORMAL HIGH (ref 65–99)
Glucose-Capillary: 101 mg/dL — ABNORMAL HIGH (ref 65–99)
Glucose-Capillary: 150 mg/dL — ABNORMAL HIGH (ref 65–99)

## 2015-07-03 LAB — CREATININE, SERUM
Creatinine, Ser: 1.1 mg/dL (ref 0.61–1.24)
GFR calc Af Amer: >60 mL/min (ref >60)
GFR calc non Af Amer: >60 mL/min (ref >60)

## 2015-07-03 MED ORDER — INSULIN ASPART 100 UNIT/ML ~~LOC~~ SOLN: SUBCUTANEOUS | Status: DC

## 2015-07-03 NOTE — Clinical Social Work Placement (Signed)
   CLINICAL SOCIAL WORK PLACEMENT  NOTE  Date:  07/03/2015  Patient Details  Name: Christopher Delacruz MRN: [id number] Date of Birth: 1950/05/02  Clinical Social Work is seeking post-discharge placement for this patient at the [place] level of care (*CSW will initial, date and re-position this form in  chart as items are completed):  Yes   Patient/family provided with [facility] Work Department's list of facilities offering this level of care within the geographic area requested by the patient (or if unable, by the patient's family).  Yes   Patient/family informed of their freedom to choose among providers that offer the needed level of care, that participate in Medicare, Medicaid or managed care program needed by the patient, have an available bed and are willing to accept the patient.  Yes   Patient/family informed of [place]'s ownership interest in [facility] and [facility], as well as of the fact that they are under no obligation to receive care at these facilities.  PASRR submitted to EDS on       PASRR number received on       Existing PASRR number confirmed on 07/03/15     FL2 transmitted to all facilities in geographic area requested by pt/family on 07/03/15     FL2 transmitted to all facilities within larger geographic area on       Patient informed that his/her managed care company has contracts with or will negotiate with certain facilities, including the following:        Yes   Patient/family informed of bed offers received.  Patient chooses bed at [facility]     Physician recommends and patient chooses bed at  [facility])    Patient to be transferred to [facility] on 07/03/15.  Patient to be transferred to facility by Pt's brother     Patient family notified on 07/03/15 of transfer.  Name of family member notified:  Pt's brother and sister in law     PHYSICIAN       Additional Comment:     _______________________________________________ Darden Dates, LCSW 07/03/2015, 4:12 PM

## 2015-07-03 NOTE — Discharge Summary (Signed)
[place] at [place] NAME: Christopher Delacruz    MR#:  [id number]  DATE OF BIRTH:  May 02, 1950  DATE OF ADMISSION:  06/29/2015 ADMITTING PHYSICIAN: Harrie Foreman, MD  DATE OF DISCHARGE: 07/03/2015  PRIMARY CARE PHYSICIAN: No PCP Per Patient    ADMISSION DIAGNOSIS:  Confusion [R41.0] Left arm weakness [R29.898] Word finding difficulty [R47.89] Acute CVA (cerebrovascular accident) ([place]) [I63.9]  DISCHARGE DIAGNOSIS:  Active Problems:   CVA (cerebral infarction)   SECONDARY DIAGNOSIS:   Past Medical History  Diagnosis Date  . Hypertension   . Hyperlipidemia   . Diabetes mellitus [facility])     HOSPITAL COURSE:  Christopher Delacruz  is a 65 y.o. male admitted 06/29/2015 with chief complaint Altered Mental Status . Please see H&P performed by Harrie Foreman, MD for further information. Patient presented with the above complaints including weakness and word finding difficulties. Found to have acute left basal ganglia stroke. Evaluated by neurology on this admission as well. PT/OT evaluation recommend SNF to which the patient and family agree. Has has made some improvement since being in the hospital but not yet at baseline.   DISCHARGE CONDITIONS:   stable  CONSULTS OBTAINED:  Treatment Team:  [name], MD  DRUG ALLERGIES:  No Known Allergies  DISCHARGE MEDICATIONS:   Current Discharge Medication List    CONTINUE these medications which have CHANGED   Details  !! insulin aspart (NOVOLOG) 100 UNIT/ML injection CBG < 70: implement hypoglycemia protocol CBG 70 - 120: 0 units CBG 121 - 150: 2 units CBG 151 - 200: 3 units CBG 201 - 250: 5 units CBG 251 - 300: 8 units CBG 301 - 350: 11 units CBG 351 - 400: 15 units Qty: 10 mL, Refills: 11    !! insulin aspart (NOVOLOG) 100 UNIT/ML injection CBG < 70: implement hypoglycemia protocol CBG 70 - 120: 0 units CBG 121 - 150: 0 units CBG 151 - 200: 0 units CBG 201 - 250: 2 units CBG  251 - 300: 3 units CBG 301 - 350: 4 units CBG 351 - 400: 5 units Qty: 10 mL, Refills: 11     !! - Potential duplicate medications found. Please discuss with provider.    CONTINUE these medications which have NOT CHANGED   Details  amLODipine (NORVASC) 5 MG tablet Take 5 mg by mouth daily. Refills: 4    aspirin 81 MG chewable tablet Chew 1 tablet (81 mg total) by mouth daily. Qty: 30 tablet, Refills: 5    atorvastatin (LIPITOR) 80 MG tablet Take 80 mg by mouth at bedtime. Refills: 10    lisinopril (PRINIVIL,ZESTRIL) 20 MG tablet Take 1 tablet (20 mg total) by mouth 2 (two) times daily. Qty: 60 tablet, Refills: 5    ondansetron (ZOFRAN) 4 MG tablet Take 1 tablet (4 mg total) by mouth every 6 (six) hours as needed for nausea. Qty: 20 tablet, Refills: 0    senna-docusate (SENOKOT-S) 8.6-50 MG tablet Take 1 tablet by mouth at bedtime as needed for mild constipation. Qty: 30 tablet, Refills: 5         DISCHARGE INSTRUCTIONS:    DIET:  Diabetic diet  DISCHARGE CONDITION:  Stable  ACTIVITY:  Activity as tolerated  OXYGEN:  Home Oxygen: No.   Oxygen Delivery: room air  DISCHARGE LOCATION:  nursing home   If you experience worsening of your admission symptoms, develop shortness of breath, life threatening emergency, suicidal or homicidal thoughts you must seek medical  attention immediately by calling 911 or calling your MD immediately  if symptoms less severe.  You Must read complete instructions/literature along with all the possible adverse reactions/side effects for all the Medicines you take and that have been prescribed to you. Take any new Medicines after you have completely understood and accpet all the possible adverse reactions/side effects.   Please note  You were cared for by a hospitalist during your hospital stay. If you have any questions about your discharge medications or the care you received while you were in the hospital after you are discharged, you  can call the unit and asked to speak with the hospitalist on call if the hospitalist that took care of you is not available. Once you are discharged, your primary care physician will handle any further medical issues. Please note that NO REFILLS for any discharge medications will be authorized once you are discharged, as it is imperative that you return to your primary care physician (or establish a relationship with a primary care physician if you do not have one) for your aftercare needs so that they can reassess your need for medications and monitor your lab values.    On the day of Discharge:   VITAL SIGNS:  Blood pressure 154/73, pulse 62, temperature 97.5 F (36.4 C), temperature source Oral, resp. rate 24, height 6\' 1"  (1.854 m), weight 253 lb 8 oz (114.987 kg), SpO2 98 %.  I/O:   Intake/Output Summary (Last 24 hours) at 07/03/15 1144 Last data filed at 07/02/15 1733  Gross per 24 hour  Intake    240 ml  Output      0 ml  Net    240 ml    PHYSICAL EXAMINATION:  GENERAL:  65 y.o.-year-old patient lying in the bed with no acute distress.  EYES: Pupils equal, round, reactive to light and accommodation. No scleral icterus. Extraocular muscles intact.  HEENT: Head atraumatic, normocephalic. Oropharynx and nasopharynx clear.  NECK:  Supple, no jugular venous distention. No thyroid enlargement, no tenderness.  LUNGS: Normal breath sounds bilaterally, no wheezing, rales,rhonchi or crepitation. No use of accessory muscles of respiration.  CARDIOVASCULAR: S1, S2 normal. No murmurs, rubs, or gallops.  ABDOMEN: Soft, non-tender, non-distended. Bowel sounds present. No organomegaly or mass.  EXTREMITIES: No pedal edema, cyanosis, or clubbing.  NEUROLOGIC: Cranial nerves II through XII are intact. Muscle strength 4/5 in all extremities. Sensation intact. Gait not checked.  PSYCHIATRIC: The patient is alert and oriented x 3.  SKIN: No obvious rash, lesion, or ulcer.   DATA REVIEW:    CBC  Recent Labs Lab 07/02/15 0446  WBC 9.2  HGB 12.8*  HCT 39.7*  PLT 204    Chemistries   Recent Labs Lab 06/29/15 1809 07/03/15 0609  NA 142  --   K 4.1  --   CL 109  --   CO2 26  --   GLUCOSE 134*  --   BUN 19  --   CREATININE 1.44* 1.10  CALCIUM 9.5  --   AST 18  --   ALT 13*  --   ALKPHOS 85  --   BILITOT 0.7  --     Cardiac Enzymes No results for input(s): TROPONINI in the last 168 hours.  Microbiology Results  Results for orders placed or performed during the hospital encounter of 06/23/15  MRSA PCR Screening     Status: None   Collection Time: 06/24/15  2:06 AM  Result Value Ref Range Status   MRSA  by PCR NEGATIVE NEGATIVE Final    Comment:        The GeneXpert MRSA Assay (FDA approved for NASAL specimens only), is one component of a comprehensive MRSA colonization surveillance program. It is not intended to diagnose MRSA infection nor to guide or monitor treatment for MRSA infections.     RADIOLOGY:  No results found.   Management plans discussed with the patient, family and they are in agreement.  CODE STATUS:     Code Status Orders        Start     Ordered   06/30/15 0245  Full code   Continuous     06/30/15 0244    Code Status History    Date Active Date Inactive Code Status Order ID Comments User Context   06/24/2015  1:53 AM 06/27/2015  6:04 PM Full Code [id number]  Quintella Baton, MD Inpatient      TOTAL TIME TAKING CARE OF THIS PATIENT: 28 minutes.    Christopher Delacruz,  [name].D on 07/03/2015 at 11:44 AM  Between 7am to 6pm - Pager - [phone]  After 6pm go to [email or web address] - [employer] [facility]  Office  [phone]  CC: Primary care physician; No PCP Per Patient

## 2015-07-03 NOTE — Clinical Social Work Note (Signed)
Clinical Social Work Assessment  Patient Details  Name: Christopher Delacruz MRN: [id number] Date of Birth: 1950/11/11  Date of referral:  07/03/15               Reason for consult:  Facility Placement                Permission sought to share information with:  Family Supports Permission granted to share information::  Yes, Verbal Permission Granted  Name::     [name], brother   Housing/Transportation Living arrangements for the past 2 months:  Single Family Home Source of Information:  Patient, Other (Comment Required) (Brother and sister in Sports coach) Patient Interpreter Needed:  None Criminal Activity/Legal Involvement Pertinent to Current Situation/Hospitalization:  No - Comment as needed Significant Relationships:  Siblings Lives with:  Self Do you feel safe going back to the place where you live?  Yes Need for family participation in patient care:  Yes (Comment)  Care giving concerns:  No care giving concerns identified.   Social Worker assessment / plan:  CSW met with pt to address consult for SNF. CSW introduced herself and explained role of social work. CSW also explained process of discharging to SNF with Medicare. CSW initiated a bed search and followed up with bed offers, as pt is ready for discharge today. WIth pt's permission, CSW also updated pt's brother and sister in law. Pt's sister in law chose [facility]. Pt will be transported by pt's brother. RN called report. [facility] is ready to admit pt as they have received discharge information. CSW is signing off as no further needs identified.   Employment status:  Retired Forensic scientist:  Medicare PT Recommendations:  [place] / Referral to community resources:  [place]  Patient/Family's Response to care:  Pt and family were appreciative of CSW support.   Patient/Family's Understanding of and Emotional Response to Diagnosis, Current Treatment, and Prognosis:  Pt and family  understand that pt would benefit for STR.   Emotional Assessment Appearance:  Appears stated age Attitude/Demeanor/Rapport:  Other (Appropriate) Affect (typically observed):  Accepting, Adaptable, Pleasant Orientation:  Oriented to Self, Oriented to Place, Oriented to  Time, Oriented to Situation Alcohol / Substance use:  Never Used Psych involvement (Current and /or in the community):  No (Comment)  Discharge Needs  Concerns to be addressed:  Adjustment to Illness Readmission within the last 30 days:  Yes Current discharge risk:  Chronically ill Barriers to Discharge:  No Barriers Identified   Darden Dates, LCSW 07/03/2015, 4:16 PM

## 2015-07-03 NOTE — Progress Notes (Signed)
Physical Therapy Treatment Patient Details Name: Christopher Delacruz MRN: [id number] DOB: 06/13/1950 Today's Date: 07/03/2015    History of Present Illness 65 year old African-American male with past medical history significant for diabetes, essential hypertension, who presents to the hospital with complaints of right weakness, disorientation, slurred speech, difficulty expressing himself. Pt was recently discharged from [facility]. 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 Pt was admitted again with altered menatl status.     PT Comments    Pt without voiced complaints. Pt feels speech improving today. Pt continues to demonstrate mild unsteadiness with ambulation with rolling walker particularly with turning and more so with ambulation without assistive device. Pt insists on ambulating in room to/from bathroom without assistive device, yet grabs for furniture and walls throughout. Encouraged use of rolling walker versus furniture/wall walking; also educated brother in this regard. Initiated stand hip/lower extremity exercises for strengthening and balance today. Pt performs well, but does demonstrate some weakness with both closed and open chain lower extremity primarily at the knee. At the time of this documentation, pt has discharge orders to skilled nursing facility today to progress strength, endurance, safety, balance and quality of ambulation and stair climbing to ultimately return home safely with improved functional mobility.   Follow Up Recommendations  SNF     Equipment Recommendations  Rolling walker with 5" wheels    Recommendations for Other Services       Precautions / Restrictions Precautions Precautions: Fall Restrictions Weight Bearing Restrictions: No    Mobility  Bed Mobility Overal bed mobility: Modified Independent             General bed mobility comments: Mild increased time. Requires time to sit  before ready to stand  Transfers Overall transfer level: Modified independent Equipment used: Rolling walker (2 wheeled) Transfers: Sit to/from Stand Sit to Stand: Supervision         General transfer comment: Stands on first attempt today; static stand required before reaty to take steps  Ambulation/Gait Ambulation/Gait assistance: Min guard;Supervision Ambulation Distance (Feet): 120 Feet (2 short walks 66ft to/from bathroom; no assistive device) Assistive device: Rolling walker (2 wheeled);None Gait Pattern/deviations: Step-through pattern Gait velocity: decreased Gait velocity interpretation: Below normal speed for age/gender General Gait Details: Ambulates well, but with mild difficulty/unsteadiness on turning rquiring Min guard with rw. Pt insists on ambulating without AD to/from bathroom in room; however, denomstrates unsteadiness/lack of confidence, as pt grabs furniture and walls throughout ambulation. also demonstrates slower less coordinated ambulation without AD   Stairs            Wheelchair Mobility    Modified Rankin (Stroke Patients Only)       Balance Overall balance assessment: Needs assistance Sitting-balance support: Feet supported Sitting balance-Leahy Scale: Good     Standing balance support: Bilateral upper extremity supported Standing balance-Leahy Scale: Fair (Fair (-) without device)                      Cognition Arousal/Alertness: Awake/alert Behavior During Therapy: WFL for tasks assessed/performed Overall Cognitive Status: Within Functional Limits for tasks assessed                      Exercises General Exercises - Lower Extremity Hip ABduction/ADduction: Strengthening;Both;10 reps;Standing (2 sets) Straight Leg Raises: Strengthening;Both;10 reps;Standing (2 sets) Hip Flexion/Marching: Strengthening;Both;20 reps;Standing Other Exercises Other Exercises: B hip extension in stand 2 sets of 10 unable to  keep either  LE straight Other Exercises: B knee flexion in stand 2 sets of 10    General Comments        Pertinent Vitals/Pain      Home Living                      Prior Function            PT Goals (current goals can now be found in the care plan section) Progress towards PT goals: Progressing toward goals    Frequency  7X/week    PT Plan Current plan remains appropriate    Co-evaluation             End of Session Equipment Utilized During Treatment: Gait belt Activity Tolerance: Patient tolerated treatment well (weakness in R > L le with stand exercises especially at knee) Patient left: Other (comment) (with nursing assistant to return to bed when clean)     Time: 1035-1110 PT Time Calculation (min) (ACUTE ONLY): 35 min  Charges:  $Gait Training: 8-22 mins $Therapeutic Exercise: 8-22 mins                    G Codes:      Charlaine Dalton, PTA 07/03/2015, 1:28 PM

## 2015-07-03 NOTE — Clinical Social Work Placement (Signed)
   CLINICAL SOCIAL WORK PLACEMENT  NOTE  Date:  07/03/2015  Patient Details  Name: Christopher Delacruz MRN: [id number] Date of Birth: 10/30/1950  Clinical Social Work is seeking post-discharge placement for this patient at the [place] level of care (*CSW will initial, date and re-position this form in  chart as items are completed):  Yes   Patient/family provided with [place] Work Department's list of facilities offering this level of care within the geographic area requested by the patient (or if unable, by the patient's family).  Yes   Patient/family informed of their freedom to choose among providers that offer the needed level of care, that participate in Medicare, Medicaid or managed care program needed by the patient, have an available bed and are willing to accept the patient.  Yes   Patient/family informed of [facility]'s ownership interest in [facility] and [facility], as well as of the fact that they are under no obligation to receive care at these facilities.  PASRR submitted to EDS on       PASRR number received on       Existing PASRR number confirmed on 07/03/15     FL2 transmitted to all facilities in geographic area requested by pt/family on 07/03/15     FL2 transmitted to all facilities within larger geographic area on       Patient informed that his/her managed care company has contracts with or will negotiate with certain facilities, including the following:            Patient/family informed of bed offers received.  Patient chooses bed at       Physician recommends and patient chooses bed at      Patient to be transferred to   on  .  Patient to be transferred to facility by       Patient family notified on   of transfer.  Name of family member notified:        PHYSICIAN       Additional Comment:    _______________________________________________ Darden Dates, LCSW 07/03/2015, 10:35 AM

## 2015-07-03 NOTE — NC FL2 (Signed)
[place] LEVEL OF CARE SCREENING TOOL     IDENTIFICATION  Patient Name: Christopher Delacruz Birthdate: 1950-09-03 Sex: male Admission Date (Current Location): 06/29/2015  [place] and [place] Number:  Engineering geologist and Address:  [facility], [address] [place], [place] [address]      Provider Number: [id number]  Attending Physician Name and Address:  Lytle Butte, MD  Relative Name and Phone Number:       Current Level of Care: Hospital Recommended Level of Care: [place] Prior Approval Number:    Date Approved/Denied:   PASRR Number: [id number]  Discharge Plan: SNF    Current Diagnoses: Patient Active Problem List   Diagnosis Date Noted  . Right sided weakness 06/26/2015  . Dysphagia 06/26/2015  . Essential hypertension, malignant 06/26/2015  . Acute renal insufficiency 06/26/2015  . Hypokalemia 06/26/2015  . CVA (cerebral infarction) 06/23/2015  . Diabetes ([place]) 06/23/2015  . HTN (hypertension) 06/23/2015    Orientation RESPIRATION BLADDER Height & Weight     Self, Time, Situation, Place  Normal Continent Weight: 253 lb 8 oz (114.987 kg) Height:  6\' 1"  (185.4 cm)  BEHAVIORAL SYMPTOMS/MOOD NEUROLOGICAL BOWEL NUTRITION STATUS      Continent Diet (Soft Diet, Thin Liquids)  AMBULATORY STATUS COMMUNICATION OF NEEDS Skin   Limited Assist Verbally Normal                       Personal Care Assistance Level of Assistance  Bathing, Feeding, Dressing Bathing Assistance: Limited assistance Feeding assistance: Limited assistance Dressing Assistance: Limited assistance     Functional Limitations Info  Sight, Hearing, Speech Sight Info: Adequate Hearing Info: Adequate Speech Info: Adequate    SPECIAL CARE FACTORS FREQUENCY  PT (By licensed PT), OT (By licensed OT), Speech therapy     PT Frequency: 5 OT Frequency: 5     Speech Therapy Frequency: 5      Contractures      Additional  Factors Info  Code Status, Allergies, Insulin Sliding Scale Code Status Info: Full Code Allergies Info: No known allergies   Insulin Sliding Scale Info: 4x/day       Current Medications (07/03/2015):  This is the current hospital active medication list Current Facility-Administered Medications  Medication Dose Route Frequency Provider Last Rate Last Dose  . acetaminophen (TYLENOL) tablet 650 mg  650 mg Oral Q6H PRN Harrie Foreman, MD       Or  . acetaminophen (TYLENOL) suppository 650 mg  650 mg Rectal Q6H PRN Harrie Foreman, MD      . amLODipine (NORVASC) tablet 5 mg  5 mg Oral Daily Harrie Foreman, MD   5 mg at 07/02/15 1010  . aspirin chewable tablet 81 mg  81 mg Oral Daily Harrie Foreman, MD   81 mg at 07/02/15 1010  . atorvastatin (LIPITOR) tablet 80 mg  80 mg Oral QHS Harrie Foreman, MD   80 mg at 07/02/15 2111  . docusate sodium (COLACE) capsule 100 mg  100 mg Oral BID Harrie Foreman, MD   100 mg at 07/02/15 2111  . enoxaparin (LOVENOX) injection 40 mg  40 mg Subcutaneous Q24H Lytle Butte, MD   40 mg at 07/02/15 1537  . insulin aspart (novoLOG) injection 0-15 Units  0-15 Units Subcutaneous TID WC Harrie Foreman, MD   3 Units at 07/02/15 1759  . insulin aspart (novoLOG) injection 0-5 Units  0-5 Units Subcutaneous  QHS Harrie Foreman, MD   0 Units at 06/30/15 2256  . lisinopril (PRINIVIL,ZESTRIL) tablet 20 mg  20 mg Oral BID Harrie Foreman, MD   20 mg at 07/02/15 2111  . ondansetron (ZOFRAN) tablet 4 mg  4 mg Oral Q6H PRN Harrie Foreman, MD       Or  . ondansetron [facility]) injection 4 mg  4 mg Intravenous Q6H PRN Harrie Foreman, MD      . sodium chloride flush (NS) 0.9 % injection 3 mL  3 mL Intravenous Q12H Harrie Foreman, MD   3 mL at 07/02/15 2111     Discharge Medications: Please see discharge summary for a list of discharge medications.  Relevant Imaging Results:  Relevant Lab Results:   Additional Information SSN:  [id number]  Darden Dates, LCSW

## 2015-07-03 NOTE — Progress Notes (Signed)
Report called to Brittney at [facility].  Pt's IV removed and pt transported via car by his sister-in-law.  Clarise Cruz, RN

## 2017-11-04 ENCOUNTER — Encounter: Payer: Self-pay | Admitting: *Deleted

## 2017-11-16 ENCOUNTER — Other Ambulatory Visit: Payer: Self-pay

## 2017-11-16 ENCOUNTER — Ambulatory Visit (INDEPENDENT_AMBULATORY_CARE_PROVIDER_SITE_OTHER): Payer: Medicare Other | Admitting: Urology

## 2017-11-16 ENCOUNTER — Telehealth: Payer: Self-pay | Admitting: Urology

## 2017-11-16 ENCOUNTER — Encounter: Payer: Self-pay | Admitting: Urology

## 2017-11-16 VITALS — BP 176/63 | HR 80 | Ht 73.0 in | Wt 249.8 lb

## 2017-11-16 DIAGNOSIS — R350 Frequency of micturition: Secondary | ICD-10-CM

## 2017-11-16 DIAGNOSIS — R351 Nocturia: Secondary | ICD-10-CM | POA: Diagnosis not present

## 2017-11-16 LAB — BLADDER SCAN AMB NON-IMAGING: Scan Result: 87

## 2017-11-16 NOTE — Telephone Encounter (Signed)
Pt called to check on the status of the medication (pt did not know the name of the Rx) that was supposed to be called in for him, pt states he is at [facility] now and it's not there. please send to [facility].  Please advise. Thanks

## 2017-11-16 NOTE — Progress Notes (Signed)
11/16/2017 11:05 AM   Christopher Delacruz 1950-06-25 [id number]  Referring provider: Donnie Coffin, MD [place], [place] [address]  No chief complaint on file.   HPI: I was consulted to assess the patient's frequency.  The details of the history were difficult to ascertain.  I believe he voids every 90 minutes and gets up 4 times a night.  His flow is moderate and he feels empty.  I think he has had a TIA and borderline diabetes.  It appears he is on insulin and I do not think he is on an alpha-blocker.  He is continent.  He denies a history of kidney stones previous GU surgery and his bowel movements are normal.  Modifying factors: There are no other modifying factors  Associated signs and symptoms: There are no other associated signs and symptoms Aggravating and relieving factors: There are no other aggravating or relieving factors Severity: Moderate Duration: Persistent   PMH: Past Medical History:  Diagnosis Date  . Diabetes mellitus ([place])   . Hyperlipidemia   . Hypertension     Surgical History: No past surgical history on file.  Home Medications:  Allergies as of 11/16/2017   No Known Allergies     Medication List        Accurate as of 11/16/17 11:05 AM. Always use your most recent med list.          amLODipine 5 MG tablet Commonly known as:  NORVASC Take 5 mg by mouth daily.   aspirin 81 MG chewable tablet Chew 1 tablet (81 mg total) by mouth daily.   atorvastatin 80 MG tablet Commonly known as:  LIPITOR Take 80 mg by mouth at bedtime.   insulin aspart 100 UNIT/ML injection Commonly known as:  novoLOG CBG < 70: implement hypoglycemia protocol CBG 70 - 120: 0 units CBG 121 - 150: 2 units CBG 151 - 200: 3 units CBG 201 - 250: 5 units CBG 251 - 300: 8 units CBG 301 - 350: 11 units CBG 351 - 400: 15 units   insulin aspart 100 UNIT/ML injection Commonly known as:  novoLOG CBG < 70: implement hypoglycemia protocol CBG 70 -  120: 0 units CBG 121 - 150: 0 units CBG 151 - 200: 0 units CBG 201 - 250: 2 units CBG 251 - 300: 3 units CBG 301 - 350: 4 units CBG 351 - 400: 5 units   lisinopril 20 MG tablet Commonly known as:  PRINIVIL,ZESTRIL Take 1 tablet (20 mg total) by mouth 2 (two) times daily.   ondansetron 4 MG tablet Commonly known as:  ZOFRAN Take 1 tablet (4 mg total) by mouth every 6 (six) hours as needed for nausea.   senna-docusate 8.6-50 MG tablet Commonly known as:  Senokot-S Take 1 tablet by mouth at bedtime as needed for mild constipation.       Allergies: No Known Allergies  Family History: No family history on file.  Social History:  reports that he has never smoked. He does not have any smokeless tobacco history on file. His alcohol and drug histories are not on file.  ROS:                                        Physical Exam: There were no vitals taken for this visit.  Constitutional:  Alert and oriented, No acute distress. HEENT: [place] AT, moist mucus  membranes.  Trachea midline, no masses. Cardiovascular: No clubbing, cyanosis, or edema. Respiratory: Normal respiratory effort, no increased work of breathing. GI: Abdomen is soft, nontender, nondistended, no abdominal masses GU: No CVA tenderness.  50 g benign prostate Skin: No rashes, bruises or suspicious lesions. Lymph: No cervical or inguinal adenopathy. Neurologic: Grossly intact, no focal deficits, moving all 4 extremities. Psychiatric: Normal mood and affect.  Laboratory Data: Lab Results  Component Value Date   WBC 9.2 07/02/2015   HGB 12.8 (L) 07/02/2015   HCT 39.7 (L) 07/02/2015   MCV 86.2 07/02/2015   PLT 204 07/02/2015    Lab Results  Component Value Date   CREATININE 1.10 07/03/2015    No results found for: PSA  No results found for: TESTOSTERONE  Lab Results  Component Value Date   HGBA1C 5.9 06/29/2015    Urinalysis    Component Value Date/Time   COLORURINE YELLOW  (A) 06/30/2015 0810   APPEARANCEUR CLEAR (A) 06/30/2015 0810   LABSPEC 1.023 06/30/2015 0810   PHURINE 5.0 06/30/2015 0810   GLUCOSEU 50 (A) 06/30/2015 0810   HGBUR NEGATIVE 06/30/2015 0810   BILIRUBINUR NEGATIVE 06/30/2015 0810   KETONESUR NEGATIVE 06/30/2015 0810   PROTEINUR >500 (A) 06/30/2015 0810   NITRITE NEGATIVE 06/30/2015 0810   LEUKOCYTESUR NEGATIVE 06/30/2015 0810    Pertinent Imaging:   Assessment & Plan: Patient appears to have lower urinary tract symptoms relatives of benign prostatic hyperplasia.  I did not order or see a recent PSA.  Residual today was 87 mL.  I think he is getting urgency as well  Reassess in 6 weeks on Flomax  There are no diagnoses linked to this encounter.  No follow-ups on file.  Reece Packer, MD  [facility] [address] [place], [place] [address] [phone]

## 2017-11-16 NOTE — Telephone Encounter (Signed)
flomax .,4 mg  30x11

## 2017-11-30 ENCOUNTER — Other Ambulatory Visit: Payer: Self-pay | Admitting: Family Medicine

## 2017-11-30 MED ORDER — TAMSULOSIN HCL 0.4 MG PO CAPS: 0.4000 mg | ORAL_CAPSULE | Freq: Every day | ORAL | 11 refills | Status: DC

## 2017-12-28 ENCOUNTER — Encounter: Payer: Self-pay | Admitting: Urology

## 2017-12-28 ENCOUNTER — Ambulatory Visit: Payer: Medicare Other | Admitting: Urology

## 2018-03-21 ENCOUNTER — Emergency Department: Payer: Medicare Other

## 2018-03-21 ENCOUNTER — Other Ambulatory Visit: Payer: Self-pay

## 2018-03-21 ENCOUNTER — Inpatient Hospital Stay
Admission: EM | Admit: 2018-03-21 | Discharge: 2018-03-23 | DRG: 065 | Disposition: A | Discharge status: Home / Self Care | Payer: Medicare Other | Attending: Internal Medicine | Admitting: Internal Medicine

## 2018-03-21 ENCOUNTER — Encounter: Payer: Self-pay | Admitting: Emergency Medicine

## 2018-03-21 DIAGNOSIS — N289 Disorder of kidney and ureter, unspecified: Secondary | ICD-10-CM

## 2018-03-21 DIAGNOSIS — E860 Dehydration: Secondary | ICD-10-CM | POA: Diagnosis present

## 2018-03-21 DIAGNOSIS — R42 Dizziness and giddiness: Secondary | ICD-10-CM

## 2018-03-21 DIAGNOSIS — E785 Hyperlipidemia, unspecified: Secondary | ICD-10-CM | POA: Diagnosis present

## 2018-03-21 DIAGNOSIS — E1151 Type 2 diabetes mellitus with diabetic peripheral angiopathy without gangrene: Secondary | ICD-10-CM | POA: Diagnosis present

## 2018-03-21 DIAGNOSIS — I10 Essential (primary) hypertension: Secondary | ICD-10-CM | POA: Diagnosis present

## 2018-03-21 DIAGNOSIS — Z794 Long term (current) use of insulin: Secondary | ICD-10-CM

## 2018-03-21 DIAGNOSIS — I639 Cerebral infarction, unspecified: Secondary | ICD-10-CM | POA: Diagnosis not present

## 2018-03-21 DIAGNOSIS — E119 Type 2 diabetes mellitus without complications: Secondary | ICD-10-CM

## 2018-03-21 DIAGNOSIS — I69351 Hemiplegia and hemiparesis following cerebral infarction affecting right dominant side: Secondary | ICD-10-CM

## 2018-03-21 DIAGNOSIS — N179 Acute kidney failure, unspecified: Secondary | ICD-10-CM | POA: Diagnosis present

## 2018-03-21 DIAGNOSIS — Z23 Encounter for immunization: Secondary | ICD-10-CM

## 2018-03-21 DIAGNOSIS — R29700 NIHSS score 0: Secondary | ICD-10-CM | POA: Diagnosis present

## 2018-03-21 DIAGNOSIS — Z7982 Long term (current) use of aspirin: Secondary | ICD-10-CM

## 2018-03-21 HISTORY — DX: Cerebral infarction, unspecified: I63.9

## 2018-03-21 LAB — BASIC METABOLIC PANEL
Anion gap: 8 (ref 5–15)
BUN: 24 mg/dL — ABNORMAL HIGH (ref 8–23)
CO2: 27 mmol/L (ref 22–32)
Calcium: 9 mg/dL (ref 8.9–10.3)
Chloride: 104 mmol/L (ref 98–111)
Creatinine, Ser: 1.98 mg/dL — ABNORMAL HIGH (ref 0.61–1.24)
GFR calc Af Amer: 39 mL/min — ABNORMAL LOW (ref >60)
GFR calc non Af Amer: 34 mL/min — ABNORMAL LOW (ref >60)
Glucose, Bld: 157 mg/dL — ABNORMAL HIGH (ref 70–99)
Potassium: 3.9 mmol/L (ref 3.5–5.1)
Sodium: 139 mmol/L (ref 135–145)

## 2018-03-21 LAB — CBC
HCT: 44.7 % (ref 39.0–52.0)
Hemoglobin: 13.8 g/dL (ref 13.0–17.0)
MCH: 27.1 pg (ref 26.0–34.0)
MCHC: 30.9 g/dL (ref 30.0–36.0)
MCV: 87.6 fL (ref 80.0–100.0)
Platelets: 314 10*3/uL (ref 150–400)
RBC: 5.1 MIL/uL (ref 4.22–5.81)
RDW: 13.2 % (ref 11.5–15.5)
WBC: 13.1 10*3/uL — ABNORMAL HIGH (ref 4.0–10.5)
nRBC: 0 % (ref 0.0–0.2)

## 2018-03-21 LAB — TROPONIN I: Troponin I: <0.03 ng/mL (ref <0.03)

## 2018-03-21 MED ORDER — SODIUM CHLORIDE 0.9 % IV BOLUS
1000.0000 mL | Freq: Once | INTRAVENOUS | Status: AC
  Administered 2018-03-21: 1000 mL via INTRAVENOUS

## 2018-03-21 NOTE — ED Triage Notes (Signed)
PT arrives after being encouraged from pt's boss. Pt states he had a fall on Friday due to feeling weak. Pt states he did hit his head but denies a loc. Pt reports he fell on his left side and has pain in that area. Pt reports "I still feel a little weak." In triage pt a&o x 4.

## 2018-03-21 NOTE — ED Notes (Addendum)
Family at bedside and states patient's speech is more slurred today.

## 2018-03-21 NOTE — ED Provider Notes (Signed)
[facility] Emergency Department Provider Note   ____________________________________________   First MD Initiated Contact with Patient 03/21/18 [phone]     (approximate)  I have reviewed the triage vital signs and the nursing notes.   HISTORY  Chief Complaint Fall    HPI Christopher Delacruz is a 68 y.o. male previous history of stroke  Patient reports that on Friday he was walking in his house when he thinks he tripped, fell back and landed on her wrist backside.  Is able to get himself up.  Reports his lower back was a little bit "sore" but has been not having any of the ongoing pain or discomfort.  The last couple days noticed just felt a little bit "off balance" from time to time.  No chest pain no trouble breathing.  No nausea or vomiting.  No headaches.  Reports he seems to be doing well but talked to his boss and his boss recommended he come in and get evaluated.  He has some slight weakness, but reports that he is always had a little bit of weakness and a little bit of trouble with his speaking since his stroke, but his family report his speech seems a little bit worse today.  Denies any recent illness.  No fevers or chills.  Not any pain or discomfort now.   Past Medical History:  Diagnosis Date  . CVA (cerebral vascular accident) ([place])   . Diabetes mellitus ([place])   . Hyperlipidemia   . Hypertension     Patient Active Problem List   Diagnosis Date Noted  . Right sided weakness 06/26/2015  . Dysphagia 06/26/2015  . Essential hypertension, malignant 06/26/2015  . Acute renal insufficiency 06/26/2015  . Hypokalemia 06/26/2015  . CVA (cerebral infarction) 06/23/2015  . Diabetes ([place]) 06/23/2015  . HTN (hypertension) 06/23/2015    History reviewed. No pertinent surgical history.  Prior to Admission medications   Medication Sig Start Date End Date Taking? Authorizing Provider  amLODipine (NORVASC) 5 MG tablet Take 5 mg by mouth daily. 04/11/15    [provider]  aspirin 81 MG chewable tablet Chew 1 tablet (81 mg total) by mouth daily. 06/26/15   Theodoro Grist, MD  atorvastatin (LIPITOR) 80 MG tablet Take 80 mg by mouth at bedtime. 04/11/15   [provider]  insulin aspart (NOVOLOG) 100 UNIT/ML injection CBG < 70: implement hypoglycemia protocol CBG 70 - 120: 0 units CBG 121 - 150: 2 units CBG 151 - 200: 3 units CBG 201 - 250: 5 units CBG 251 - 300: 8 units CBG 301 - 350: 11 units CBG 351 - 400: 15 units 07/03/15   Hower, Aaron Mose, MD  insulin aspart (NOVOLOG) 100 UNIT/ML injection CBG < 70: implement hypoglycemia protocol CBG 70 - 120: 0 units CBG 121 - 150: 0 units CBG 151 - 200: 0 units CBG 201 - 250: 2 units CBG 251 - 300: 3 units CBG 301 - 350: 4 units CBG 351 - 400: 5 units 07/03/15   Hower, Aaron Mose, MD  lisinopril (PRINIVIL,ZESTRIL) 20 MG tablet Take 1 tablet (20 mg total) by mouth 2 (two) times daily. 06/26/15   Theodoro Grist, MD  ondansetron (ZOFRAN) 4 MG tablet Take 1 tablet (4 mg total) by mouth every 6 (six) hours as needed for nausea. Patient not taking: Reported on 11/16/2017 06/26/15   Theodoro Grist, MD  senna-docusate (SENOKOT-S) 8.6-50 MG tablet Take 1 tablet by mouth at bedtime as needed for mild constipation. Patient not taking:  Reported on 11/16/2017 06/26/15   Theodoro Grist, MD  tamsulosin (FLOMAX) 0.4 MG CAPS capsule Take 1 capsule (0.4 mg total) by mouth daily. 11/30/17   Bjorn Loser, MD    Allergies Patient has no known allergies.  No family history on file.  Social History Social History   Tobacco Use  . Smoking status: Never Smoker  . Smokeless tobacco: Never Used  Substance Use Topics  . Alcohol use: Never    Alcohol/week: 0.0 standard drinks    Frequency: Never  . Drug use: Never    Review of Systems Constitutional: No fever/chills Eyes: No visual changes. ENT: No sore throat. Cardiovascular: Denies chest pain. Respiratory: Denies shortness of  breath. Gastrointestinal: No abdominal pain.   Genitourinary: Negative for dysuria. Musculoskeletal: Negative for back pain. Skin: Negative for rash. Neurological: Negative for headaches, areas of focal weakness or numbness.  Does have some chronic speech trouble, patient reports he has not noticed any trouble speaking.  He has felt a little bit off balance from time to time over the last couple of days.    ____________________________________________   PHYSICAL EXAM:  VITAL SIGNS: ED Triage Vitals  Enc Vitals Group     BP 03/21/18 1542 (!) 176/74     Pulse Rate 03/21/18 1542 71     Resp 03/21/18 1542 16     Temp 03/21/18 1542 97.6 F (36.4 C)     Temp Source 03/21/18 1542 Oral     SpO2 03/21/18 1542 94 %     Weight 03/21/18 1545 260 lb (117.9 kg)     Height 03/21/18 1545 6\' 2"  (1.88 m)     Head Circumference --      Peak Flow --      Pain Score 03/21/18 1545 0     Pain Loc --      Pain Edu? --      Excl. in [place]? --     Constitutional: Alert and oriented. Well appearing and in no acute distress. Eyes: Conjunctivae are normal. Head: Atraumatic. Nose: No congestion/rhinnorhea. Mouth/Throat: Mucous membranes are moist. Neck: No stridor.  Cardiovascular: Normal rate, regular rhythm. Grossly normal heart sounds.  Good peripheral circulation. Respiratory: Normal respiratory effort.  No retractions. Lungs CTAB. Gastrointestinal: Soft and nontender. No distention. Musculoskeletal: No lower extremity tenderness nor edema. Neurologic:  Normal speech and language except the case he seems to have a slight word finding difficulty but he reports that that is been present for a long time. No gross focal neurologic deficits are appreciated.  No pronator drift.  Normal strength and coordination in all extremities.  No ataxia.  No facial droop.  Cranial nerve exam normal Skin:  Skin is warm, dry and intact. No rash noted. Psychiatric: Mood and affect are normal. Speech and behavior are  normal.  ____________________________________________   LABS (all labs ordered are listed, but only abnormal results are displayed)  Labs Reviewed  BASIC METABOLIC PANEL - Abnormal; Notable for the following components:      Result Value   Glucose, Bld 157 (*)    BUN 24 (*)    Creatinine, Ser 1.98 (*)    GFR calc non Af Amer 34 (*)    GFR calc Af Amer 39 (*)    All other components within normal limits  CBC - Abnormal; Notable for the following components:   WBC 13.1 (*)    All other components within normal limits  TROPONIN I  URINALYSIS, COMPLETE (UACMP) WITH MICROSCOPIC   ____________________________________________  EKG  Reviewed and interpreted at 1555 Heart rate 80 QRS 100 QTc 450 Normal sinus rhythm, probable left ventricular hypertrophy.  No evidence of acute ischemia.  Occasional pause without block.  ____________________________________________  RADIOLOGY  Dg Chest 2 View  Result Date: 03/21/2018 CLINICAL DATA:  Fall EXAM: CHEST - 2 VIEW COMPARISON:  06/23/2015 FINDINGS: Heart is normal size. Mild elevation of the left hemidiaphragm with left base atelectasis or scarring. Right lung clear. No effusions or acute bony abnormality. IMPRESSION: Left base atelectasis or scarring.  No active disease. Electronically Signed   By: Rolm Baptise M.D.   On: 03/21/2018 16:52   Ct Head Wo Contrast  Result Date: 03/21/2018 CLINICAL DATA:  Left-sided hip pain after fall on Friday. EXAM: CT HEAD WITHOUT CONTRAST TECHNIQUE: Contiguous axial images were obtained from the base of the skull through the vertex without intravenous contrast. COMPARISON:  CT head dated June 29, 2015. FINDINGS: Brain: No evidence of acute infarction, hemorrhage, hydrocephalus, extra-axial collection or mass lesion/mass effect. Old infarct in the left basal ganglia with compensatory dilatation of the left frontal horn. Progressive moderate chronic microvascular ischemic changes. Unchanged mild diffuse cerebral  atrophy. Vascular: Atherosclerotic vascular calcification of the carotid siphons. No hyperdense vessel. Skull: Normal. Negative for fracture or focal lesion. Sinuses/Orbits: No acute finding. 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 Other: None. IMPRESSION: 1.  No acute intracranial abnormality. 2. Old left basal ganglia infarct. Progressive moderate chronic microvascular ischemic changes. Electronically Signed   By: Titus Dubin M.D.   On: 03/21/2018 16:25    Chest x-ray and CT scan reviewed, no acute findings are noted, old basal ganglia infarct ____________________________________________   PROCEDURES  Procedure(s) performed: None  Procedures  Critical Care performed: No  ____________________________________________   INITIAL IMPRESSION / ASSESSMENT AND PLAN / ED COURSE  Pertinent labs & imaging results that were available during my care of the patient were reviewed by me and considered in my medical decision making (see chart for details).   Impingements for evaluation, reports he had a fall on Friday under his back.  Does not seem to have sustained any injury.  No thoracic lumbar cervical tenderness.  Reports that he is not having ongoing back pain.  He has felt a little bit dizzy or a little off balance for last couple days.  He reports he thought there might be some subtle change in his speech as well.  He has a history of prior stroke, CT shows no acute finding given his symptoms of some feeling of imbalance will order an MRI of the brain to evaluate further and exclude acute neurologic etiology such as recurrent stroke  He overall reports feels well, there is no obvious focal deficit on examination except he does have a little bit of a slight word finding difficulty but again reports this to be chronic himself though the family did report this is seeming change.  He is resting comfortably in no acute distress.  Lab work reassuring except for what appears to be some mild  renal insufficiency.  Suspect based on lab work patient may be slightly dehydrated as well, he feels a little fatigued.  Will hydrate, or obtain MRI of the brain.  Plan to reassess thereafter, but if MRI is reassuring and the patient is well hydrated I would anticipate discharge with close primary follow-up.  The patient is in agreement with this plan.  No evidence of acute cardiac etiology.  No chest pain.  No pulmonary symptoms.  ----------------------------------------- 8:51  PM on 03/21/2018 -----------------------------------------  Ongoing care signed Dr. Kerman Passey, follow-up on reevaluation after hydration as well as MRI results ordered to exclude acute stroke      ____________________________________________   FINAL CLINICAL IMPRESSION(S) / ED DIAGNOSES  Final diagnoses:  Mild dehydration  Dizziness        Note:  This document was prepared using Dragon voice recognition software and may include unintentional dictation errors       Delman Kitten, MD 03/21/18 2052

## 2018-03-21 NOTE — ED Notes (Signed)
Patient taken to MRI

## 2018-03-21 NOTE — ED Notes (Signed)
Patient given warm blanket.

## 2018-03-21 NOTE — ED Notes (Signed)
Report given to Butch RN 

## 2018-03-21 NOTE — ED Notes (Signed)
Patient independently undressed.

## 2018-03-21 NOTE — ED Provider Notes (Signed)
-----------------------------------------   11:31 PM on 03/21/2018 -----------------------------------------  Patient's MRI shows acute for subacute CVA.  I discussed this with the patient.  We will admit to the hospital service for further work-up and treatment.  Patient agreeable to plan of care.   Harvest Dark, MD 03/21/18 [phone]

## 2018-03-21 NOTE — ED Notes (Signed)
Patient given ED sandwich tray and diet ginger ale per Dr. Kerman Passey approval.

## 2018-03-22 ENCOUNTER — Observation Stay
Admit: 2018-03-22 | Discharge: 2018-03-22 | Disposition: A | Discharge status: Home / Self Care | Payer: Medicare Other | Attending: Internal Medicine | Admitting: Internal Medicine

## 2018-03-22 ENCOUNTER — Observation Stay: Payer: Medicare Other

## 2018-03-22 ENCOUNTER — Other Ambulatory Visit: Payer: Self-pay

## 2018-03-22 DIAGNOSIS — I639 Cerebral infarction, unspecified: Secondary | ICD-10-CM | POA: Diagnosis present

## 2018-03-22 DIAGNOSIS — Z23 Encounter for immunization: Secondary | ICD-10-CM | POA: Diagnosis present

## 2018-03-22 DIAGNOSIS — R29700 NIHSS score 0: Secondary | ICD-10-CM | POA: Diagnosis present

## 2018-03-22 DIAGNOSIS — R42 Dizziness and giddiness: Secondary | ICD-10-CM | POA: Diagnosis present

## 2018-03-22 DIAGNOSIS — Z7982 Long term (current) use of aspirin: Secondary | ICD-10-CM | POA: Diagnosis not present

## 2018-03-22 DIAGNOSIS — E785 Hyperlipidemia, unspecified: Secondary | ICD-10-CM | POA: Diagnosis present

## 2018-03-22 DIAGNOSIS — Z794 Long term (current) use of insulin: Secondary | ICD-10-CM | POA: Diagnosis not present

## 2018-03-22 DIAGNOSIS — N179 Acute kidney failure, unspecified: Secondary | ICD-10-CM | POA: Diagnosis present

## 2018-03-22 DIAGNOSIS — E1151 Type 2 diabetes mellitus with diabetic peripheral angiopathy without gangrene: Secondary | ICD-10-CM | POA: Diagnosis present

## 2018-03-22 DIAGNOSIS — G459 Transient cerebral ischemic attack, unspecified: Secondary | ICD-10-CM | POA: Diagnosis not present

## 2018-03-22 DIAGNOSIS — I69351 Hemiplegia and hemiparesis following cerebral infarction affecting right dominant side: Secondary | ICD-10-CM | POA: Diagnosis not present

## 2018-03-22 DIAGNOSIS — I10 Essential (primary) hypertension: Secondary | ICD-10-CM | POA: Diagnosis present

## 2018-03-22 DIAGNOSIS — E860 Dehydration: Secondary | ICD-10-CM | POA: Diagnosis present

## 2018-03-22 LAB — ECHOCARDIOGRAM COMPLETE
Height: 74 in
Weight: 4160 oz

## 2018-03-22 LAB — LIPID PANEL
Cholesterol: 106 mg/dL (ref 0–200)
HDL: 37 mg/dL — ABNORMAL LOW (ref >40)
LDL Cholesterol: 57 mg/dL (ref 0–99)
Total CHOL/HDL Ratio: 2.9 RATIO
Triglycerides: 58 mg/dL (ref <150)
VLDL: 12 mg/dL (ref 0–40)

## 2018-03-22 LAB — HEMOGLOBIN A1C
Hgb A1c MFr Bld: 5.2 % (ref 4.8–5.6)
Mean Plasma Glucose: 102.54 mg/dL

## 2018-03-22 MED ORDER — ATORVASTATIN CALCIUM 20 MG PO TABS
80.0000 mg | ORAL_TABLET | Freq: Every day | ORAL | Status: DC
  Administered 2018-03-22: 22:00:00 80 mg via ORAL
  Filled 2018-03-22: qty 4

## 2018-03-22 MED ORDER — STROKE: EARLY STAGES OF RECOVERY BOOK
Freq: Once | Status: AC
  Administered 2018-03-22: 06:00:00

## 2018-03-22 MED ORDER — ASPIRIN 81 MG PO CHEW
81.0000 mg | CHEWABLE_TABLET | Freq: Every day | ORAL | Status: DC
  Administered 2018-03-22 – 2018-03-23 (×2): 81 mg via ORAL
  Filled 2018-03-22 (×2): qty 1

## 2018-03-22 MED ORDER — PNEUMOCOCCAL VAC POLYVALENT 25 MCG/0.5ML IJ INJ
0.5000 mL | INJECTION | INTRAMUSCULAR | Status: AC
  Administered 2018-03-23: 11:00:00 0.5 mL via INTRAMUSCULAR
  Filled 2018-03-22: qty 0.5

## 2018-03-22 MED ORDER — ACETAMINOPHEN 325 MG PO TABS: 650.0000 mg | ORAL_TABLET | ORAL | Status: DC | PRN

## 2018-03-22 MED ORDER — CLOPIDOGREL BISULFATE 75 MG PO TABS
75.0000 mg | ORAL_TABLET | Freq: Every day | ORAL | Status: DC
  Administered 2018-03-22 – 2018-03-23 (×2): 75 mg via ORAL
  Filled 2018-03-22 (×2): qty 1

## 2018-03-22 MED ORDER — ACETAMINOPHEN 650 MG RE SUPP: 650.0000 mg | RECTAL | Status: DC | PRN

## 2018-03-22 MED ORDER — ACETAMINOPHEN 160 MG/5ML PO SOLN: 650.0000 mg | ORAL | Status: DC | PRN

## 2018-03-22 MED ORDER — HEPARIN SODIUM (PORCINE) 5000 UNIT/ML IJ SOLN
5000.0000 [IU] | Freq: Three times a day (TID) | INTRAMUSCULAR | Status: DC
  Administered 2018-03-22 – 2018-03-23 (×4): 5000 [IU] via SUBCUTANEOUS
  Filled 2018-03-22 (×4): qty 1

## 2018-03-22 NOTE — Care Management Obs Status (Signed)
[facility] NOTIFICATION   Patient Details  Name: Christopher Delacruz MRN: [id number] Date of Birth: 1950-03-04   Medicare Observation Status Notification Given:  Yes    Katrina Stack, RN 03/22/2018, 7:32 PM

## 2018-03-22 NOTE — Procedures (Signed)
ELECTROENCEPHALOGRAM REPORT   Patient: Christopher Delacruz       Room #: 129A-AA EEG No. ID: [id number] Age: 69 y.o.        Sex: male Referring Physician: Bridgett Larsson Report Date:  03/22/2018        Interpreting Physician: Alexis Goodell  History: Christopher Delacruz is an 68 y.o. male presenting after an unexplained fall  Medications:  ASA  Conditions of Recording:  This is a 21 channel routine scalp EEG performed with bipolar and monopolar montages arranged in accordance to the international 10/20 system of electrode placement. One channel was dedicated to EKG recording.  The patient is in the awake and drowsy states.  Description:  The waking background activity consists of a low voltage, symmetrical, fairly well organized, 8-9 Hz alpha activity, seen from the parieto-occipital and posterior temporal regions.  Low voltage fast activity, poorly organized, is seen anteriorly and is at times superimposed on more posterior regions.  A mixture of theta and alpha rhythms are seen from the central and temporal regions. The patient drowses with slowing to irregular, low voltage theta and beta activity.   Stage II sleep is not obtained. No epileptiform activity is noted.   Hyperventilation was not performed.  Intermittent photic stimulation was performed but failed to illicit any change in the tracing.   IMPRESSION: Normal electroencephalogram, awake, drowsy and with activation procedures. There are no focal lateralizing or epileptiform features.   Alexis Goodell, MD Neurology [phone] 03/22/2018, 12:36 PM

## 2018-03-22 NOTE — Progress Notes (Signed)
PT Cancellation Note  Patient Details Name: Christopher Delacruz MRN: [id number] DOB: 03/10/1950   Cancelled Treatment:    Reason Eval/Treat Not Completed: Other (comment). Pt stated he has been up with his nephew and was too tired for PT mobility ck.  Will try again at another time.   Ramond Dial 03/22/2018, 4:10 PM  Mee Hives, PT MS Acute Rehab Dept. Number: [place] [facility] [place]

## 2018-03-22 NOTE — Consult Note (Addendum)
Referring Physician: Demetrios Loll, MD    Chief Complaint: Fall and slurred speech  HPI: Christopher Delacruz is an 68 y.o. male with past medical history of CVA ( left basal ganglia infarct, 2017), diabetes mellitus, hyperlipidemia and hypertension presenting to the ED on 03/21/2018 following a fall due to sudden onset weakness, unsteady gait and also noted to have slurred speech.  Patient report that he fell on Friday while in the bathroom injuring his left side but without hitting his head on the ground.  Reports that as a result of the fall he has had pain on the left side and difficulty walking. Denied other associated symptoms of altered sensorium, cranial nerve deficit, seizures like activity, vision disturbances, nausea or vomiting, syncope or LOC.  However reports numbness and weakness in his right lower extremity.  His employer convinced him to come to the ED for further evaluation.  On arrival to the ED, blood pressure was 176/74 mm Hg and pulse rate 71 beats/min. There were no focal neurological deficits; he was alert and oriented x4, and he did not demonstrate any memory deficits.  Initial NIH stroke scale 0.  A non-contrast head CT showed no acute intracranial abnormality.  Follow-up MRI of the brain showed punctate acute/early subacute infarction within left medial parietal white matter.  Patient was therefore admitted for further stroke work-up and management. Patient has remained free from recurrent events and maintains a secondary prevention medication regimen including 81mg  of aspirin and 80mg  of atorvastatin.  Date last known well: Date: 03/19/2018 Time last known well: Unable to determine tPA Given: No: outside window period  Past Medical History:  Diagnosis Date  . CVA (cerebral vascular accident) ([place])   . Diabetes mellitus ([place])   . Hyperlipidemia   . Hypertension     Past Surgical History:  Procedure Laterality Date  . EXPLORE EYE SOCKET      History reviewed. No pertinent family  history. Social History:  reports that he has never smoked. He has never used smokeless tobacco. He reports that he does not drink alcohol or use drugs.  Allergies: No Known Allergies  Medications:  I have reviewed the patient's current medications. Prior to Admission:  Medications Prior to Admission  Medication Sig Dispense Refill Last Dose  . amLODipine (NORVASC) 5 MG tablet Take 5 mg by mouth daily.  4 03/20/2018 at 1700  . aspirin 81 MG chewable tablet Chew 1 tablet (81 mg total) by mouth daily. 30 tablet 5 03/20/2018 at 1000  . atorvastatin (LIPITOR) 80 MG tablet Take 80 mg by mouth at bedtime.  10 03/20/2018 at 1700  . lisinopril (PRINIVIL,ZESTRIL) 20 MG tablet Take 1 tablet (20 mg total) by mouth 2 (two) times daily. 60 tablet 5 03/20/2018 at 1700  . tamsulosin (FLOMAX) 0.4 MG CAPS capsule Take 1 capsule (0.4 mg total) by mouth daily. 30 capsule 11 03/20/2018 at 1700  . ondansetron (ZOFRAN) 4 MG tablet Take 1 tablet (4 mg total) by mouth every 6 (six) hours as needed for nausea. (Patient not taking: Reported on 11/16/2017) 20 tablet 0 Not Taking  . senna-docusate (SENOKOT-S) 8.6-50 MG tablet Take 1 tablet by mouth at bedtime as needed for mild constipation. (Patient not taking: Reported on 11/16/2017) 30 tablet 5 Not Taking   Scheduled: . aspirin  81 mg Oral Daily  . atorvastatin  80 mg Oral QHS  . heparin  5,000 Units Subcutaneous Q8H  . [START ON 03/23/2018] pneumococcal 23 valent vaccine  0.5 mL Intramuscular Tomorrow-1000  ROS: Unable to obtain as patient is a poor historian  Physical Examination: Blood pressure (!) 157/75, pulse 90, temperature 97.8 F (36.6 C), temperature source Oral, resp. rate 18, height 6\' 2"  (1.88 m), weight 117.9 kg, SpO2 92 %.   HEENT-  Normocephalic, no lesions, without obvious abnormality.  Normal external eye and conjunctiva.  Normal TM's bilaterally.  Normal auditory canals and external ears. Normal external nose, mucus membranes and septum.   Normal pharynx. Cardiovascular- S1, S2 normal, pulses palpable throughout   Lungs- chest clear, no wheezing, rales, normal symmetric air entry Abdomen- soft, non-tender; bowel sounds normal; no masses,  no organomegaly Extremities- no edema Lymph-no adenopathy palpable Musculoskeletal-no joint tenderness, deformity or swelling Skin-warm and dry, no hyperpigmentation, vitiligo, or suspicious lesions  Neurological Exam   Mental Status: Alert, oriented, thought content appropriate.  Speech fluent without evidence of aphasia.  Able to follow simple commands but with some difficulty processing information. Attention span and concentration seemed somewhat impaired. Required some prompting and redirection. Cranial Nerves: II: Discs flat bilaterally; Visual fields grossly normal, pupils equal, round, reactive to light and accommodation III,IV, VI: ptosis not present, extra-ocular motions intact bilaterally V,VII: mild right facial droop, facial light touch sensation intact VIII: hearing normal bilaterally IX,X: gag reflex present XI: bilateral shoulder shrug XII: midline tongue extension Motor: Right :  Upper extremity   4+/5 Without pronator drift      Left: Upper extremity   5/5 without pronator drift Right:   Lower extremity   5/5                                          Left: Lower extremity   5/5 Tone and bulk:normal tone throughout; no atrophy noted Sensory: Pinprick and light touch  decreased on the right lower extremity Deep Tendon Reflexes: 2+ and symmetric throughout Plantars:  Right: mute                              Left: mute Cerebellar: Finger-to-nose testing dysmetric on the right. Heel to shin testing normal bilaterally Gait: not tested due to safety concerns  *Neurological examination is not consistent.  On repeat exam patient did not exhibit any weakness and reported no sensory disturbances.  Continued to have some facial droop and exhibited some RLE dysmetria.    Data  Reviewed  Laboratory Studies:  Basic Metabolic Panel: Recent Labs  Lab 03/21/18 1548  NA 139  K 3.9  CL 104  CO2 27  GLUCOSE 157*  BUN 24*  CREATININE 1.98*  CALCIUM 9.0    Liver Function Tests: No results for input(s): AST, ALT, ALKPHOS, BILITOT, PROT, ALBUMIN in the last 168 hours. No results for input(s): LIPASE, AMYLASE in the last 168 hours. No results for input(s): AMMONIA in the last 168 hours.  CBC: Recent Labs  Lab 03/21/18 1548  WBC 13.1*  HGB 13.8  HCT 44.7  MCV 87.6  PLT 314    Cardiac Enzymes: Recent Labs  Lab 03/21/18 1548  TROPONINI <0.03    BNP: Invalid input(s): POCBNP  CBG: No results for input(s): GLUCAP in the last 168 hours.  Microbiology: Results for orders placed or performed during the hospital encounter of 06/23/15  MRSA PCR Screening     Status: None   Collection Time: 06/24/15  2:06 AM  Result Value Ref Range  Status   MRSA by PCR NEGATIVE NEGATIVE Final    Comment:        The GeneXpert MRSA Assay (FDA approved for NASAL specimens only), is one component of a comprehensive MRSA colonization surveillance program. It is not intended to diagnose MRSA infection nor to guide or monitor treatment for MRSA infections.     Coagulation Studies: No results for input(s): LABPROT, INR in the last 72 hours.  Urinalysis: No results for input(s): COLORURINE, LABSPEC, PHURINE, GLUCOSEU, HGBUR, BILIRUBINUR, KETONESUR, PROTEINUR, UROBILINOGEN, NITRITE, LEUKOCYTESUR in the last 168 hours.  Invalid input(s): APPERANCEUR  Lipid Panel:    Component Value Date/Time   CHOL 106 03/22/2018 0558   TRIG 58 03/22/2018 0558   HDL 37 (L) 03/22/2018 0558   CHOLHDL 2.9 03/22/2018 0558   VLDL 12 03/22/2018 0558   LDLCALC 57 03/22/2018 0558    HgbA1C:  Lab Results  Component Value Date   HGBA1C 5.9 06/29/2015    Urine Drug Screen:  No results found for: LABOPIA, COCAINSCRNUR, LABBENZ, AMPHETMU, THCU, LABBARB  Alcohol Level: No results  for input(s): ETH in the last 168 hours.  Other results: EKG: normal EKG, normal sinus rhythm, unchanged from previous tracings, PAC's noted. Vent. rate 82 BPM PR interval 160 ms QRS duration 98 ms QT/QTc 388/453 ms P-R-T axes 43 -44 50  Imaging: Dg Chest 2 View  Result Date: 03/21/2018 CLINICAL DATA:  Fall EXAM: CHEST - 2 VIEW COMPARISON:  06/23/2015 FINDINGS: Heart is normal size. Mild elevation of the left hemidiaphragm with left base atelectasis or scarring. Right lung clear. No effusions or acute bony abnormality. IMPRESSION: Left base atelectasis or scarring.  No active disease. Electronically Signed   By: Rolm Baptise M.D.   On: 03/21/2018 16:52   Ct Head Wo Contrast  Result Date: 03/21/2018 CLINICAL DATA:  Left-sided hip pain after fall on Friday. EXAM: CT HEAD WITHOUT CONTRAST TECHNIQUE: Contiguous axial images were obtained from the base of the skull through the vertex without intravenous contrast. COMPARISON:  CT head dated June 29, 2015. FINDINGS: Brain: No evidence of acute infarction, hemorrhage, hydrocephalus, extra-axial collection or mass lesion/mass effect. Old infarct in the left basal ganglia with compensatory dilatation of the left frontal horn. Progressive moderate chronic microvascular ischemic changes. Unchanged mild diffuse cerebral atrophy. Vascular: Atherosclerotic vascular calcification of the carotid siphons. No hyperdense vessel. Skull: Normal. Negative for fracture or focal lesion. Sinuses/Orbits: No acute finding. 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 Other: None. IMPRESSION: 1.  No acute intracranial abnormality. 2. Old left basal ganglia infarct. Progressive moderate chronic microvascular ischemic changes. Electronically Signed   By: Titus Dubin M.D.   On: 03/21/2018 16:25   Mr Brain Wo Contrast  Result Date: 03/21/2018 CLINICAL DATA:  68 y/o M; fall, some slight balance trouble for the last couple of days, prior stroke. Exclude stroke. EXAM:  MRI HEAD WITHOUT CONTRAST TECHNIQUE: Multiplanar, multiecho pulse sequences of the brain and surrounding structures were obtained without intravenous contrast. COMPARISON:  03/21/2018 CT head.  06/24/2015 MRI head. FINDINGS: Brain: Punctate focus of reduced diffusion within left medial parietal white matter compatible with acute/early subacute infarction (series 2, image 41). No associated hemorrhage or mass effect. Chronic left anterior basal ganglia posterior limb of internal capsule infarction with wallerian degeneration extending into cerebral peduncle and brainstem. Very small chronic infarction within left posterior midbrain. Large confluent nonspecific T2 FLAIR hyperintensities in subcortical and periventricular white matter are compatible with advanced chronic microvascular ischemic changes. Moderate volume loss of the  brain. Hemosiderin staining is present within the left anterior basal ganglia chronic infarction as well as within the left superior cerebellar peduncle. No acute hemorrhage, extra-axial collection, hydrocephalus, or herniation. No focal mass effect. Vascular: Normal flow voids. Skull and upper cervical spine: Normal marrow signal. Sinuses/Orbits: Negative. Other: None. IMPRESSION: 1. Punctate acute/early subacute infarction within left medial parietal white matter. No hemorrhage or mass effect. 2. Advanced chronic microvascular ischemic changes and moderate volume loss of the brain. Chronic infarcts within left anterior basal ganglia and left posterior midbrain. Electronically Signed   By: Kristine Garbe M.D.   On: 03/21/2018 22:33   Patient seen and examined.  Clinical course and management discussed.  Necessary edits performed.  I agree with the above.  Assessment and plan of care developed and discussed below.   Assessment: 68 y.o. male with past medical history of CVA ( left basal ganglia infarct, 2017), diabetes mellitus, hyperlipidemia and hypertension presenting to the ED  on 03/21/2018 with fall.  Patient is a poor historian and it is unclear why the patient fell.  Has had complaints of weakness, unsteady gait and have slurred speech since the fall but patient is a poor historian and exam is inconsistent. MRI of the brain reviewed and shows  punctate acute/early subacute infarction within left medial parietal white matter.  Etiology likely small vessel disease. US carotid did not show hemodynamically significant stenosis.  LDL 57.  Patient reports that he was on aspirin 81 mg once a day prior to this event  Stroke Risk Factors - hyperlipidemia, hypertension and Prior stroke  Plan: 1. HgbA1c pending 2. PT consult, OT consult, Speech consult 3. Echocardiogram pending 4. EEG 5. Prophylactic therapy- dual therapy Aspirin 81 mg/day and Plavix 75 mg /day  6. Continue statin therapy with goal low density lipoprotein (LDL) <70 mg/dl, and lifestyle modification.  7. NPO until RN stroke swallow screen 8. Telemetry monitoring 9. Frequent neuro checks  This patient was staffed with Dr. Magda Paganini, Doy Mince who personally evaluated patient, reviewed documentation and agreed with assessment and plan of care as above.  Rufina Falco, DNP, FNP-BC Board certified Nurse Practitioner Neurology Department   03/22/2018, 8:41 AM   Alexis Goodell, MD Neurology [phone]  03/22/2018  10:37 AM

## 2018-03-22 NOTE — Progress Notes (Signed)
SLP Cancellation Note  Patient Details Name: Christopher Delacruz MRN: [id number] DOB: 05-26-1950   Cancelled treatment:       Reason Eval/Treat Not Completed: PT screened, no needs identified, will sign off. Chart reviewed; noted Neurology note indicating "no focal neurological deficits; he was alert and oriented x4, and he did not demonstrate any memory deficits.  Initial NIH stroke scale 0.Marland KitchenMarland KitchenFollow-up MRI of the brain showed punctate acute/early subacute infarction within left medial parietal white matter." Additionally noted, pt was alert, oriented, thought content appropriate. Speech fluent without evidence of aphasia. Able to follow simple commands but with some difficulty processing information. Attention span and concentration seemed somewhat impaired. Required some prompting and redirection per Neurology note on 03/22/18.  Upon engaging w/ pt, he was able to make wants/needs known, however intelligibility of speech was somewhat reduced, but improved when patient slowed down and over-articulated. Unsure of patient's baseline pattern of speech. There were no family members in room to be a point of reference re: speech clarity. When pt was asked about his speech clarity, pt responded with "I don't know, what do you think?" Patient is currently on a regular diet and tolerating well w/ no deficits reported by NSG. Recommend pt follow up w/ further formal assessment of Speech-Language abilities to rule in/out Dysarthria. Standard articulation strategies (hand-out) were given and explained to pt; pt agreed. NSG/MD updated. ST services will be available if any decline in status while admitted.   Emeline General, Graduate Student SLP 03/22/2018, 3:41 PM

## 2018-03-22 NOTE — Progress Notes (Signed)
eeg completed ° °

## 2018-03-22 NOTE — Progress Notes (Signed)
Advanced Care Plan.  Purpose of Encounter: CODE STATUS. Parties in Attendance: The patient and me. Patient's Decisional Capacity: Yes. Medical Story: Christopher Delacruz  is a 68 y.o. male with history of hypertension, diabetes, hyperlipidemia and CVA.  Patient is admitted for acute CVA.  I discussed with patient about his current condition, prognosis and the colder status.  The patient wants to be resuscitated and intubated if he has cardiopulmonary arrest. Plan:  Code Status: Full code. Time spent discussing advance care planning: 17 minutes.

## 2018-03-22 NOTE — Progress Notes (Signed)
*  PRELIMINARY RESULTS* Echocardiogram 2D Echocardiogram has been performed.  Christopher Delacruz 03/22/2018, 10:27 AM

## 2018-03-22 NOTE — Evaluation (Signed)
Occupational Therapy Evaluation Patient Details Name: Christopher Delacruz MRN: [id number] DOB: [birth year] Today's Date: 03/22/2018    History of Present Illness  Christopher Delacruz  is a 68 y.o. male who presented to the ED on 2/23 with a complaint of a fall a few days ago, and intermittent left-sided weakness since that time.  Was evaluated in the ED with CT and MRI.  Work-up was largely within normal limits until MRI was performed which showed punctate left parietal acute infarct. PMH includes diabetes, and essential HTN, and previous hospitalization for R-sided weakness and slurred speech.    Clinical Impression   Christopher Delacruz was seen for OT evaluation this date. Prior to hospital admission, pt was independent in all aspects of ADL/IADL, ambulating independently and working 2 days/week. Pt denies falls history in past 12 months other than fall that brought him to the hospital. Pt lives alone in a 1 story home with 5 steps to enter at the back with bilateral hand railing. Currently pt reporting symptoms have generally resolved. Pt demonstrates baseline independence to perform ADL and mobility tasks and no strength, sensory, coordination, cognitive, or visual deficits appreciated with assessment. Pt endorsed having been able to ambulate within his room and don LB clothing prior to OT evaluation. Pt ambulated with supervision for safety and used bed railing and countertops in room to maintain balance. Pt instructed in safe functional mobility and falls prevention strategies in order to increase independence and safety within the home. No skilled OT needs identified. Will sign off. Please re-consult if additional OT needs arise.     Follow Up Recommendations  No OT follow up    Equipment Recommendations  3 in 1 bedside commode;Toilet rise with handles    Recommendations for Other Services       Precautions / Restrictions Precautions Precautions: Fall Restrictions Weight Bearing Restrictions: No       Mobility Bed Mobility Overal bed mobility: Modified Independent             General bed mobility comments: HOB elevated, increased time.   Transfers Overall transfer level: Modified independent Equipment used: None             General transfer comment: Pt able to ambulate within room using bed railing and counter-tops for stability. Pt stood for ~5 minutes to complete grooming and oral care at room sink. Used room bathroom independently.     Balance Overall balance assessment: Modified Independent;History of Falls                                         ADL either performed or assessed with clinical judgement   ADL Overall ADL's : Modified independent                                       General ADL Comments: Pt able to complete ADLs with increased time and supervision for safety. Pt endorsed slight dizziness after change in position, which resolved within ~30 seconds.      Vision Baseline Vision/History: No visual deficits Patient Visual Report: No change from baseline       Perception     Praxis      Pertinent Vitals/Pain Pain Assessment: No/denies pain     Hand Dominance Right   Extremity/Trunk Assessment Upper Extremity Assessment Upper Extremity  Assessment: Overall WFL for tasks assessed(Grossly 4/5 B UE. )   Lower Extremity Assessment Lower Extremity Assessment: Overall WFL for tasks assessed;Defer to PT evaluation   Cervical / Trunk Assessment Cervical / Trunk Assessment: Normal   Communication Communication Communication: Expressive difficulties   Cognition Arousal/Alertness: Awake/alert Behavior During Therapy: WFL for tasks assessed/performed Overall Cognitive Status: Within Functional Limits for tasks assessed                                     General Comments  Pt endorsed slight dizziness when coming from sup>sit and sit>stand, however this resolved with brief rest breaks. Pt moved  slowly, but was otherwise stable reaching within BOS. Communication deficits noted. Pt with expressive difficulties, and difficult to understand at times. Followed verbal prompts well, appeared to have no receptive deficits.     Exercises Other Exercises Other Exercises: Assisted pt with grooming and bathroom ADL.  Other Exercises: Pt educated in safe mobility and functional transfers. Required VC's for hand placement when coming sit<>stand.    Shoulder Instructions      Home Living Family/patient expects to be discharged to:: Private residence Living Arrangements: Alone Available Help at Discharge: (Pt indicates he has neighbors but he does not like to bother them. Does not have a lot of help.) Type of Home: Apartment Home Access: Stairs to enter [employer] of Steps: 5-6 in back Entrance Stairs-Rails: Can reach both;Left;Right Home Layout: One level     Bathroom Shower/Tub: Tub/shower unit;Walk-in shower   Bathroom Toilet: Standard Bathroom Accessibility: Yes              Prior Functioning/Environment Level of Independence: Independent        Comments: Pt endorses no other falls in the past 6 months. Was working 2 days per week.         OT Problem List: Decreased strength;Impaired balance (sitting and/or standing);Decreased knowledge of use of DME or AE;Decreased safety awareness;Impaired sensation      OT Treatment/Interventions:      OT Goals(Current goals can be found in the care plan section) Acute Rehab OT Goals Patient Stated Goal: To go home and resume daily routines.  OT Goal Formulation: With patient Time For Goal Achievement: 04/05/18 Potential to Achieve Goals: Good  OT Frequency:     Barriers to D/C: Decreased caregiver support  Pt does not have consistent caregiver support.        Co-evaluation              AM-PAC OT "6 Clicks" Daily Activity     Outcome Measure Help from another person eating meals?: None Help from another  person taking care of personal grooming?: None Help from another person toileting, which includes using toliet, bedpan, or urinal?: None Help from another person bathing (including washing, rinsing, drying)?: None Help from another person to put on and taking off regular upper body clothing?: None Help from another person to put on and taking off regular lower body clothing?: None 6 Click Score: 24   End of Session Equipment Utilized During Treatment: Gait belt  Activity Tolerance: Patient tolerated treatment well Patient left: in bed;with call bell/phone within reach;with nursing/sitter in room(Nursing in room to take pt to testing at end of session. )  OT Visit Diagnosis: History of falling (Z91.81);Other abnormalities of gait and mobility (R26.89)  Time: 0954(Hospital staff in room to complete echo from 1005-1028. OT doccumented during this time. ADL/functional mobility assessment completed after echo was complete. )-1043 OT Time Calculation (min): 49 min Charges:  OT General Charges $OT Visit: 1 Visit OT Evaluation $OT Eval Low Complexity: 1 Low OT Treatments $Self Care/Home Management : 8-22 mins  Shara Blazing, M.S., OTR/L Ascom: [phone] 03/22/18, 11:07 AM

## 2018-03-22 NOTE — Care Management Note (Signed)
Case Management Note  Patient Details  Name: Christopher Delacruz MRN: [id number] Date of Birth: 17-Sep-1950  Subjective/Objective:                 Patient placed in observation for sx concerning for CVA.  MRI +. Notified UR and attending and patient admitted.Marland Kitchen He was independent in all his adls and is employed . Did not require any assistive devices.  His nephew and sister are the only family members.  Per nephew- patient's speech has been greatly affected.  Says current level of speech is not patient's baseline. Nephew verbalizes concern regarding patient discharging home as he does live alone and limited caregiver support.  Says that patient has shown significant issues with his balance with this CVA. There are also concern that patent at preset level of speech, would not be able to communicate effectively over the phone in the event of an emergency . No OT follow up recommended. SLP has assessed for swallowing and will follow up with further assessment of speech.  Action/Plan:  Await PT  recommendations    Expected Discharge Date:                  Expected Discharge Plan:     In-House Referral:     Discharge planning Services     Post Acute Care Choice:    Choice offered to:     DME Arranged:    DME Agency:     HH Arranged:    HH Agency:     Status of Service:     If discussed at [employer] of Stay Meetings, dates discussed:    Additional Comments:  Katrina Stack, RN 03/22/2018, 7:12 PM

## 2018-03-22 NOTE — H&P (Signed)
[facility] [place] NAME: Christopher Delacruz    MR#:  [id number]  DATE OF BIRTH:  [birth year]  DATE OF ADMISSION:  03/21/2018  PRIMARY CARE PHYSICIAN: Donnie Coffin, MD   REQUESTING/REFERRING PHYSICIAN: Kerman Passey, MD  CHIEF COMPLAINT:   Chief Complaint  Patient presents with  . Fall    HISTORY OF PRESENT ILLNESS:  Christopher Delacruz  is a 68 y.o. male who presents with chief complaint as above.  Patient presents to the ED with a complaint of a fall a few days ago, and intermittent left-sided weakness since that time.  Here in the ED he was evaluated with for CT head and MRI brain.  Work-up was largely within normal limits until MRI was performed which showed punctate left parietal acute infarct.  Hospitalist called for admission  PAST MEDICAL HISTORY:   Past Medical History:  Diagnosis Date  . CVA (cerebral vascular accident) ([place])   . Diabetes mellitus ([place])   . Hyperlipidemia   . Hypertension      PAST SURGICAL HISTORY:   Past Surgical History:  Procedure Laterality Date  . EXPLORE EYE SOCKET       SOCIAL HISTORY:   Social History   Tobacco Use  . Smoking status: Never Smoker  . Smokeless tobacco: Never Used  Substance Use Topics  . Alcohol use: Never    Alcohol/week: 0.0 standard drinks    Frequency: Never     FAMILY HISTORY:    Family history reviewed and is non-contributory DRUG ALLERGIES:  No Known Allergies  MEDICATIONS AT HOME:   Prior to Admission medications   Medication Sig Start Date End Date Taking? Authorizing Provider  amLODipine (NORVASC) 5 MG tablet Take 5 mg by mouth daily. 04/11/15  Yes [provider]  aspirin 81 MG chewable tablet Chew 1 tablet (81 mg total) by mouth daily. 06/26/15  Yes Theodoro Grist, MD  atorvastatin (LIPITOR) 80 MG tablet Take 80 mg by mouth at bedtime. 04/11/15  Yes [provider]  lisinopril (PRINIVIL,ZESTRIL) 20 MG tablet Take 1 tablet (20 mg total)  by mouth 2 (two) times daily. 06/26/15  Yes Theodoro Grist, MD  tamsulosin (FLOMAX) 0.4 MG CAPS capsule Take 1 capsule (0.4 mg total) by mouth daily. 11/30/17  Yes MacDiarmid, Nicki Reaper, MD  ondansetron (ZOFRAN) 4 MG tablet Take 1 tablet (4 mg total) by mouth every 6 (six) hours as needed for nausea. Patient not taking: Reported on 11/16/2017 06/26/15   Theodoro Grist, MD  senna-docusate (SENOKOT-S) 8.6-50 MG tablet Take 1 tablet by mouth at bedtime as needed for mild constipation. Patient not taking: Reported on 11/16/2017 06/26/15   Theodoro Grist, MD    REVIEW OF SYSTEMS:  Review of Systems  Constitutional: Negative for chills, fever, malaise/fatigue and weight loss.  HENT: Negative for ear pain, hearing loss and tinnitus.   Eyes: Negative for blurred vision, double vision, pain and redness.  Respiratory: Negative for cough, hemoptysis and shortness of breath.   Cardiovascular: Negative for chest pain, palpitations, orthopnea and leg swelling.  Gastrointestinal: Negative for abdominal pain, constipation, diarrhea, nausea and vomiting.  Genitourinary: Negative for dysuria, frequency and hematuria.  Musculoskeletal: Positive for falls. Negative for back pain, joint pain and neck pain.  Skin:       No acne, rash, or lesions  Neurological: Positive for focal weakness. Negative for dizziness, tremors and weakness.  Endo/Heme/Allergies: Negative for polydipsia. Does not bruise/bleed easily.  Psychiatric/Behavioral: Negative for depression. The patient is not  nervous/anxious and does not have insomnia.      VITAL SIGNS:   Vitals:   03/21/18 1930 03/21/18 2000 03/21/18 2105 03/21/18 2302  BP: (!) 150/82 (!) 160/82 (!) 161/82 (!) 218/87  Pulse: (!) 51 (!) 55 (!) 49 72  Resp:   16 18  Temp:      TempSrc:      SpO2: 93% 94% 95% 95%  Weight:      Height:       Wt Readings from Last 3 Encounters:  03/21/18 117.9 kg  11/16/17 113.3 kg  07/03/15 115 kg    PHYSICAL EXAMINATION:  Physical Exam   Vitals reviewed. Constitutional: He is oriented to person, place, and time. He appears well-developed and well-nourished. No distress.  HENT:  Head: Normocephalic and atraumatic.  Mouth/Throat: Oropharynx is clear and moist.  Eyes: Pupils are equal, round, and reactive to light. Conjunctivae and EOM are normal. No scleral icterus.  Neck: Normal range of motion. Neck supple. No JVD present. No thyromegaly present.  Cardiovascular: Normal rate, regular rhythm and intact distal pulses. Exam reveals no gallop and no friction rub.  No murmur heard. Respiratory: Effort normal and breath sounds normal. No respiratory distress. He has no wheezes. He has no rales.  GI: Soft. Bowel sounds are normal. He exhibits no distension. There is no abdominal tenderness.  Musculoskeletal: Normal range of motion.        General: No edema.     Comments: No arthritis, no gout  Lymphadenopathy:    He has no cervical adenopathy.  Neurological: He is alert and oriented to person, place, and time. No cranial nerve deficit.  Neurologic: Cranial nerves II-XII intact, Sensation intact to light touch/pinprick, 5/5 strength in all extremities, no dysarthria, no aphasia, no dysphagia, memory intact, finger to nose testing showed no abnormality, no pronator drift  Skin: Skin is warm and dry. No rash noted. No erythema.  Psychiatric: He has a normal mood and affect. His behavior is normal. Judgment and thought content normal.    LABORATORY PANEL:   CBC Recent Labs  Lab 03/21/18 1548  WBC 13.1*  HGB 13.8  HCT 44.7  PLT 314   ------------------------------------------------------------------------------------------------------------------  Chemistries  Recent Labs  Lab 03/21/18 1548  NA 139  K 3.9  CL 104  CO2 27  GLUCOSE 157*  BUN 24*  CREATININE 1.98*  CALCIUM 9.0   ------------------------------------------------------------------------------------------------------------------  Cardiac  Enzymes Recent Labs  Lab 03/21/18 1548  TROPONINI <0.03   ------------------------------------------------------------------------------------------------------------------  RADIOLOGY:  Dg Chest 2 View  Result Date: 03/21/2018 CLINICAL DATA:  Fall EXAM: CHEST - 2 VIEW COMPARISON:  06/23/2015 FINDINGS: Heart is normal size. Mild elevation of the left hemidiaphragm with left base atelectasis or scarring. Right lung clear. No effusions or acute bony abnormality. IMPRESSION: Left base atelectasis or scarring.  No active disease. Electronically Signed   By: Rolm Baptise M.D.   On: 03/21/2018 16:52   Ct Head Wo Contrast  Result Date: 03/21/2018 CLINICAL DATA:  Left-sided hip pain after fall on Friday. EXAM: CT HEAD WITHOUT CONTRAST TECHNIQUE: Contiguous axial images were obtained from the base of the skull through the vertex without intravenous contrast. COMPARISON:  CT head dated June 29, 2015. FINDINGS: Brain: No evidence of acute infarction, hemorrhage, hydrocephalus, extra-axial collection or mass lesion/mass effect. Old infarct in the left basal ganglia with compensatory dilatation of the left frontal horn. Progressive moderate chronic microvascular ischemic changes. Unchanged mild diffuse cerebral atrophy. Vascular: Atherosclerotic vascular calcification of the carotid siphons.  No hyperdense vessel. Skull: Normal. Negative for fracture or focal lesion. Sinuses/Orbits: No acute finding. 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 Other: None. IMPRESSION: 1.  No acute intracranial abnormality. 2. Old left basal ganglia infarct. Progressive moderate chronic microvascular ischemic changes. Electronically Signed   By: Titus Dubin M.D.   On: 03/21/2018 16:25   Mr Brain Wo Contrast  Result Date: 03/21/2018 CLINICAL DATA:  68 y/o M; fall, some slight balance trouble for the last couple of days, prior stroke. Exclude stroke. EXAM: MRI HEAD WITHOUT CONTRAST TECHNIQUE: Multiplanar, multiecho pulse  sequences of the brain and surrounding structures were obtained without intravenous contrast. COMPARISON:  03/21/2018 CT head.  06/24/2015 MRI head. FINDINGS: Brain: Punctate focus of reduced diffusion within left medial parietal white matter compatible with acute/early subacute infarction (series 2, image 41). No associated hemorrhage or mass effect. Chronic left anterior basal ganglia posterior limb of internal capsule infarction with wallerian degeneration extending into cerebral peduncle and brainstem. Very small chronic infarction within left posterior midbrain. Large confluent nonspecific T2 FLAIR hyperintensities in subcortical and periventricular white matter are compatible with advanced chronic microvascular ischemic changes. Moderate volume loss of the brain. Hemosiderin staining is present within the left anterior basal ganglia chronic infarction as well as within the left superior cerebellar peduncle. No acute hemorrhage, extra-axial collection, hydrocephalus, or herniation. No focal mass effect. Vascular: Normal flow voids. Skull and upper cervical spine: Normal marrow signal. Sinuses/Orbits: Negative. Other: None. IMPRESSION: 1. Punctate acute/early subacute infarction within left medial parietal white matter. No hemorrhage or mass effect. 2. Advanced chronic microvascular ischemic changes and moderate volume loss of the brain. Chronic infarcts within left anterior basal ganglia and left posterior midbrain. Electronically Signed   By: Kristine Garbe M.D.   On: 03/21/2018 22:33    EKG:   Orders placed or performed during the hospital encounter of 03/21/18  . ED EKG  . ED EKG  . EKG 12-Lead  . EKG 12-Lead    IMPRESSION AND PLAN:  Principal Problem:   Cerebral infarction ([place]) -seen on MRI in the left parietal region.  This does not seem to match symptoms that the patient reported.  He currently does not have any detectable neurological deficits on physical exam.  Will admit per  stroke admission order set with appropriate imaging, labs, and consults especially neurology consult for further clarification and recommendations Active Problems:   Acute renal insufficiency -gentle IV fluids tonight, avoid nephrotoxins and monitor for expected improvement   Diabetes (HCC) -sliding scale insulin coverage   HTN (hypertension) -permissive hypertension tonight, blood pressure goal less than 220/120, restart antihypertensives tomorrow   HLD (hyperlipidemia) -home dose antilipid  Chart review performed and case discussed with ED provider. Labs, imaging and/or ECG reviewed by provider and discussed with patient/family. Management plans discussed with the patient and/or family.  DVT PROPHYLAXIS: SubQ lovenox   GI PROPHYLAXIS:  None  ADMISSION STATUS: Observation  CODE STATUS: Full Code Status History    Date Active Date Inactive Code Status Order ID Comments User Context   06/30/2015 0244 07/03/2015 2113 Full Code [id number]  Harrie Foreman, MD Inpatient   06/24/2015 0153 06/27/2015 1804 Full Code [id number]  Quintella Baton, MD Inpatient      TOTAL TIME TAKING CARE OF THIS PATIENT: 40 minutes.   Ethlyn Daniels 03/22/2018, 12:07 AM  [facility]  Office  [phone]  CC: Primary care physician; Donnie Coffin, MD  Note:  This document was prepared using Dragon voice recognition  software and may include unintentional dictation errors.

## 2018-03-22 NOTE — ED Notes (Signed)
ED TO INPATIENT HANDOFF REPORT  Name/Age/Gender Christopher Delacruz 68 y.o. male  Code Status Code Status History    Date Active Date Inactive Code Status Order ID Comments User Context   06/30/2015 0244 07/03/2015 2113 Full Code [id number]  Harrie Foreman, MD Inpatient   06/24/2015 0153 06/27/2015 1804 Full Code [id number]  Quintella Baton, MD Inpatient      Home/SNF/Other Home  Chief Complaint Weakness/Fall  Level of Care/Admitting Diagnosis ED Disposition    ED Disposition Condition [place]: [place] [100120]  Level of Care: Med-Surg [16]  Diagnosis: CVA (cerebral vascular accident) [facility]) [[id number]]  Admitting Physician: Lance Coon [[id number]]  Attending Physician: Lance Coon [phone]  PT Class (Do Not Modify): Observation [104]  PT Acc Code (Do Not Modify): Observation [10022]       Medical History Past Medical History:  Diagnosis Date  . CVA (cerebral vascular accident) ([place])   . Diabetes mellitus ([place])   . Hyperlipidemia   . Hypertension     Allergies No Known Allergies  IV Location/Drains/Wounds Patient Lines/Drains/Airways Status   Active Line/Drains/Airways    Name:   Placement date:   Placement time:   Site:   Days:   Peripheral IV 03/21/18 Left Antecubital   03/21/18    2112    Antecubital   1          Labs/Imaging Results for orders placed or performed during the hospital encounter of 03/21/18 (from the past 48 hour(s))  Basic metabolic panel     Status: Abnormal   Collection Time: 03/21/18  3:48 PM  Result Value Ref Range   Sodium 139 135 - 145 mmol/L   Potassium 3.9 3.5 - 5.1 mmol/L   Chloride 104 98 - 111 mmol/L   CO2 27 22 - 32 mmol/L   Glucose, Bld 157 (H) 70 - 99 mg/dL   BUN 24 (H) 8 - 23 mg/dL   Creatinine, Ser 1.98 (H) 0.61 - 1.24 mg/dL   Calcium 9.0 8.9 - 10.3 mg/dL   GFR calc non Af Amer 34 (L) >60 mL/min   GFR calc Af Amer 39 (L) >60 mL/min   Anion gap 8 5 - 15    Comment: Performed  at [facility], [address] [place], [place] [address]  CBC     Status: Abnormal   Collection Time: 03/21/18  3:48 PM  Result Value Ref Range   WBC 13.1 (H) 4.0 - 10.5 K/uL   RBC 5.10 4.22 - 5.81 MIL/uL   Hemoglobin 13.8 13.0 - 17.0 g/dL   HCT 44.7 39.0 - 52.0 %   MCV 87.6 80.0 - 100.0 fL   MCH 27.1 26.0 - 34.0 pg   MCHC 30.9 30.0 - 36.0 g/dL   RDW 13.2 11.5 - 15.5 %   Platelets 314 150 - 400 K/uL   nRBC 0.0 0.0 - 0.2 %    Comment: Performed at [facility], [address] [place], [place] [address]  Troponin I - ONCE - STAT     Status: None   Collection Time: 03/21/18  3:48 PM  Result Value Ref Range   Troponin I <0.03 <0.03 ng/mL    Comment: Performed at [facility], [address], [place], [place] [address]   Dg Chest 2 View  Result Date: 03/21/2018 CLINICAL DATA:  Fall EXAM: CHEST - 2 VIEW COMPARISON:  06/23/2015 FINDINGS: Heart is normal size. Mild elevation of the left hemidiaphragm with left base  atelectasis or scarring. Right lung clear. No effusions or acute bony abnormality. IMPRESSION: Left base atelectasis or scarring.  No active disease. Electronically Signed   By: Rolm Baptise M.D.   On: 03/21/2018 16:52   Ct Head Wo Contrast  Result Date: 03/21/2018 CLINICAL DATA:  Left-sided hip pain after fall on Friday. EXAM: CT HEAD WITHOUT CONTRAST TECHNIQUE: Contiguous axial images were obtained from the base of the skull through the vertex without intravenous contrast. COMPARISON:  CT head dated June 29, 2015. FINDINGS: Brain: No evidence of acute infarction, hemorrhage, hydrocephalus, extra-axial collection or mass lesion/mass effect. Old infarct in the left basal ganglia with compensatory dilatation of the left frontal horn. Progressive moderate chronic microvascular ischemic changes. Unchanged mild diffuse cerebral atrophy. Vascular: Atherosclerotic vascular calcification of the carotid siphons. No hyperdense vessel. Skull: Normal. Negative for  fracture or focal lesion. Sinuses/Orbits: No acute finding. 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 Other: None. IMPRESSION: 1.  No acute intracranial abnormality. 2. Old left basal ganglia infarct. Progressive moderate chronic microvascular ischemic changes. Electronically Signed   By: Titus Dubin M.D.   On: 03/21/2018 16:25   Mr Brain Wo Contrast  Result Date: 03/21/2018 CLINICAL DATA:  68 y/o M; fall, some slight balance trouble for the last couple of days, prior stroke. Exclude stroke. EXAM: MRI HEAD WITHOUT CONTRAST TECHNIQUE: Multiplanar, multiecho pulse sequences of the brain and surrounding structures were obtained without intravenous contrast. COMPARISON:  03/21/2018 CT head.  06/24/2015 MRI head. FINDINGS: Brain: Punctate focus of reduced diffusion within left medial parietal white matter compatible with acute/early subacute infarction (series 2, image 41). No associated hemorrhage or mass effect. Chronic left anterior basal ganglia posterior limb of internal capsule infarction with wallerian degeneration extending into cerebral peduncle and brainstem. Very small chronic infarction within left posterior midbrain. Large confluent nonspecific T2 FLAIR hyperintensities in subcortical and periventricular white matter are compatible with advanced chronic microvascular ischemic changes. Moderate volume loss of the brain. Hemosiderin staining is present within the left anterior basal ganglia chronic infarction as well as within the left superior cerebellar peduncle. No acute hemorrhage, extra-axial collection, hydrocephalus, or herniation. No focal mass effect. Vascular: Normal flow voids. Skull and upper cervical spine: Normal marrow signal. Sinuses/Orbits: Negative. Other: None. IMPRESSION: 1. Punctate acute/early subacute infarction within left medial parietal white matter. No hemorrhage or mass effect. 2. Advanced chronic microvascular ischemic changes and moderate volume loss of the  brain. Chronic infarcts within left anterior basal ganglia and left posterior midbrain. Electronically Signed   By: Kristine Garbe M.D.   On: 03/21/2018 22:33    Pending Labs Unresulted Labs (From admission, onward)    Start     Ordered   03/21/18 2346  Urinalysis, Complete w Microscopic  ONCE - STAT,   STAT     03/21/18 2345   Signed and Held  HIV antibody (Routine Testing)  Once,   R     Signed and Held   Signed and Held  Hemoglobin A1c  Tomorrow morning,   R     Signed and Held   Signed and Held  Lipid panel  Tomorrow morning,   R    Comments:  Fasting    Signed and Held   Signed and Held  CBC  (heparin)  Once,   R    Comments:  Baseline for heparin therapy IF NOT ALREADY DRAWN.  Notify MD if PLT < 100 K.    Signed and Held   Signed and Held  Creatinine,  serum  (heparin)  Once,   R    Comments:  Baseline for heparin therapy IF NOT ALREADY DRAWN.    Signed and Held          Vitals/Pain Today's Vitals   03/21/18 1930 03/21/18 2000 03/21/18 2105 03/21/18 2302  BP: (!) 150/82 (!) 160/82 (!) 161/82 (!) 218/87  Pulse: (!) 51 (!) 55 (!) 49 72  Resp:   16 18  Temp:      TempSrc:      SpO2: 93% 94% 95% 95%  Weight:      Height:      PainSc:   3  0-No pain    Isolation Precautions No active isolations  Medications Medications  sodium chloride 0.9 % bolus 1,000 mL ( Intravenous Restarted 03/21/18 2249)    Mobility walks

## 2018-03-23 DIAGNOSIS — G459 Transient cerebral ischemic attack, unspecified: Secondary | ICD-10-CM

## 2018-03-23 LAB — BASIC METABOLIC PANEL
Anion gap: 4 — ABNORMAL LOW (ref 5–15)
BUN: 19 mg/dL (ref 8–23)
CO2: 27 mmol/L (ref 22–32)
Calcium: 8.4 mg/dL — ABNORMAL LOW (ref 8.9–10.3)
Chloride: 109 mmol/L (ref 98–111)
Creatinine, Ser: 1.56 mg/dL — ABNORMAL HIGH (ref 0.61–1.24)
GFR calc Af Amer: 52 mL/min — ABNORMAL LOW (ref >60)
GFR calc non Af Amer: 45 mL/min — ABNORMAL LOW (ref >60)
Glucose, Bld: 98 mg/dL (ref 70–99)
Potassium: 3.8 mmol/L (ref 3.5–5.1)
Sodium: 140 mmol/L (ref 135–145)

## 2018-03-23 LAB — CBC
HCT: 39.8 % (ref 39.0–52.0)
Hemoglobin: 12.3 g/dL — ABNORMAL LOW (ref 13.0–17.0)
MCH: 26.9 pg (ref 26.0–34.0)
MCHC: 30.9 g/dL (ref 30.0–36.0)
MCV: 87.1 fL (ref 80.0–100.0)
Platelets: 238 10*3/uL (ref 150–400)
RBC: 4.57 MIL/uL (ref 4.22–5.81)
RDW: 13.2 % (ref 11.5–15.5)
WBC: 9.1 10*3/uL (ref 4.0–10.5)
nRBC: 0 % (ref 0.0–0.2)

## 2018-03-23 LAB — HIV ANTIBODY (ROUTINE TESTING W REFLEX): HIV Screen 4th Generation wRfx: NONREACTIVE

## 2018-03-23 MED ORDER — TAMSULOSIN HCL 0.4 MG PO CAPS
0.4000 mg | ORAL_CAPSULE | Freq: Every day | ORAL | Status: DC
  Administered 2018-03-23: 0.4 mg via ORAL
  Filled 2018-03-23: qty 1

## 2018-03-23 MED ORDER — CLOPIDOGREL BISULFATE 75 MG PO TABS: 75.0000 mg | ORAL_TABLET | Freq: Every day | ORAL | 1 refills | Status: DC

## 2018-03-23 MED ORDER — AMLODIPINE BESYLATE 5 MG PO TABS
5.0000 mg | ORAL_TABLET | Freq: Every day | ORAL | Status: DC
  Administered 2018-03-23: 11:00:00 5 mg via ORAL
  Filled 2018-03-23: qty 1

## 2018-03-23 MED ORDER — LISINOPRIL 20 MG PO TABS
20.0000 mg | ORAL_TABLET | Freq: Every day | ORAL | Status: DC
  Administered 2018-03-23: 11:00:00 20 mg via ORAL
  Filled 2018-03-23: qty 1

## 2018-03-23 MED ORDER — AMLODIPINE BESYLATE 10 MG PO TABS: 10.0000 mg | ORAL_TABLET | Freq: Every day | ORAL | 1 refills | Status: DC

## 2018-03-23 NOTE — Evaluation (Cosign Needed)
Speech Language Pathology Evaluation Patient Details Name: Christopher Delacruz MRN: [id number] DOB: Dec 30, 1950 Today's Date: 03/23/2018 Time: 1030-1110 SLP Time Calculation (min) (ACUTE ONLY): 40 min  Problem List:  Patient Active Problem List   Diagnosis Date Noted  . CVA (cerebral vascular accident) ([place]) 03/22/2018  . Acute CVA (cerebrovascular accident) ([place]) 03/22/2018  . HLD (hyperlipidemia) 03/21/2018  . Right sided weakness 06/26/2015  . Dysphagia 06/26/2015  . Essential hypertension, malignant 06/26/2015  . Acute renal insufficiency 06/26/2015  . Hypokalemia 06/26/2015  . Cerebral infarction ([place]) 06/23/2015  . Diabetes ([place]) 06/23/2015  . HTN (hypertension) 06/23/2015   Past Medical History:  Past Medical History:  Diagnosis Date  . CVA (cerebral vascular accident) ([place])   . Diabetes mellitus ([place])   . Hyperlipidemia   . Hypertension    Past Surgical History:  Past Surgical History:  Procedure Laterality Date  . EXPLORE EYE SOCKET     HPI:  Per admitting H&P: pt is a 68 y.o. male who presents with chief complaint of fall.  Patient presents to the ED with a complaint of a fall a few days ago, and intermittent left-sided weakness since that time.  Here in the ED he was evaluated with for CT head and MRI brain.  Work-up was largely within normal limits until MRI was performed which showed punctate left parietal acute infarct.  Hospitalist called for admission   Assessment / Plan / Recommendation Clinical Impression  ST administered pertinent portions of [place] Dysarthria Assessment (lips, tongue, intelligibility) to further identify pt strengths and needs regarding pt's speech intelligibility, post informal screen yesterday. Pt sat upright in bed and was alert and cooperative throughout entire formal Speech assessment. Pt demonstrated normal function of lips at rest, spread, sealed, in alternating movement, and in speech. Pt also demonstrated normal control of volume in  automatic speech task of counting 1-10. Pt's tongue strength and function - at rest, protrusion, elevation, lateral movement, and alternating up and down were all [facility] and normal as well. ST planned to assess pt's intelligibility by having him read single words w/o ST intelligibility model, however pt cannot read (baseline). Therefore, ST read single words and short sentences and pt repeated them back w/ 75-100% intelligibility (intelligibility decreased as length of verbal expression increases). It is important to note that ST's model in intelligibility task may have skewed pt's automatic, natural speech reading response. Pt discussed his home/work life w/ ST in conversation and was estimated 65-80% intelligible to unfamiliar listener.  ST asked pt if he would like further Skilled ST services in the Rehab setting; pt expressed he did not feel it was necessary, however ST provided contact information for further services if he changes his mind upon return to home. Pt and NSG agreed.     SLP Assessment  SLP Recommendation/Assessment: All further Speech Lanaguage Pathology  needs can be addressed in the next venue of care SLP Visit Diagnosis: Dysarthria and anarthria (R47.1)    Follow Up Recommendations  Inpatient Rehab, TBD   Frequency and Duration Other (Comment)  TBD      SLP Evaluation Cognition  Overall Cognitive Status: No family/caregiver present to determine baseline cognitive functioning Arousal/Alertness: Awake/alert Orientation Level: Oriented X4 Attention: Focused;Sustained Focused Attention: Appears intact Sustained Attention: Appears intact Memory: Appears intact Awareness: Appears intact Executive Function: Self Monitoring Self Monitoring: Impaired Self Monitoring Impairment: Verbal basic Behaviors: Impulsive Safety/Judgment: Appears intact       Comprehension  Auditory Comprehension Overall Auditory Comprehension: Appears within functional limits for tasks  assessed Yes/No Questions: Within Functional Limits Commands: Within Functional Limits Conversation: Simple Interfering Components: (N/A) Visual Recognition/Discrimination Discrimination: Not tested Reading Comprehension Reading Status: Unable to assess (comment)(pt cannot read)    Expression Expression Primary Mode of Expression: Verbal Verbal Expression Overall Verbal Expression: Appears within functional limits for tasks assessed Initiation: No impairment Automatic Speech: Name;Social Response;Counting;Day of week;Month of year Level of Generative/Spontaneous Verbalization: Conversation Repetition: No impairment Naming: Not tested Pragmatics: No impairment Interfering Components: Speech intelligibility Effective Techniques: Phonemic cues;Articulatory cues Other Verbal Expression Comments: pt articulates more clearly when asked Written Expression Dominant Hand: (NT) Written Expression: Not tested   Oral / Motor  Oral Motor/Sensory Function Overall Oral Motor/Sensory Function: Within functional limits Motor Speech Overall Motor Speech: Appears within functional limits for tasks assessed Respiration: Within functional limits Phonation: Normal Resonance: Within functional limits Articulation: Impaired Level of Impairment: Sentence Intelligibility: Intelligibility reduced Word: 75-100% accurate Phrase: 75-100% accurate Sentence: 50-74% accurate Conversation: 50-74% accurate Motor Planning: Witnin functional limits Motor Speech Errors: Not applicable Effective Techniques: Slow rate;Increased vocal intensity;Over-articulate;Pacing   [place], Graduate Student SLP 03/23/2018, 1:32 PM

## 2018-03-23 NOTE — Progress Notes (Signed)
Patient discharged home per MD order. Prescriptions given to patient. All discharge instructions given and all questions answered. 

## 2018-03-23 NOTE — Evaluation (Signed)
Physical Therapy Evaluation Patient Details Name: Christopher Delacruz MRN: [id number] DOB: 1950-04-14 Today's Date: 03/23/2018   History of Present Illness  Pt is a 68 y.o. male who presented to the ED on 2/23 with a complaint of a fall and intermittent left-sided weakness.  Was evaluated in the ED with CT and MRI.  Work-up was largely within normal limits until MRI was performed which showed punctate left parietal acute infarct. PMH includes diabetes, and essential HTN, and previous hospitalization for R-sided weakness and slurred speech.     Clinical Impression  Pt presents with mild deficits in gait and balance.  Pt presented with occasional slurred speech and word finding difficulty.  Pt was able to follow 1-2 step commands on the first try most of the time but occasionally required additional cuing to complete the requested task correctly.  No BLE strength or sensation deficits or ataxia noted.  BUE RAMs and finger to nose WNL bilaterally.  Pt was able to complete all bed mobility tasks with Mod Ind and was steady during sit to/from stand transfers with good control.  Pt was steady with amb with a RW 200' with no adverse symptoms.  Pt ambulated 59' without an AD and presented with poor gait quality and stability including decreased cadence and increased drifting left/right.  Pt will benefit from HHPT services along with supervision with mobility upon discharge to safely address above deficits for decreased caregiver assistance and eventual return to PLOF.        Follow Up Recommendations Home health PT;Supervision for mobility/OOB    Equipment Recommendations  Rolling walker with 5" wheels    Recommendations for Other Services       Precautions / Restrictions Precautions Precautions: Fall Restrictions Weight Bearing Restrictions: No      Mobility  Bed Mobility Overal bed mobility: Modified Independent             General bed mobility comments: Extra time and effort but no physical  assistance needed.   Transfers Overall transfer level: Modified independent Equipment used: Rolling walker (2 wheeled)             General transfer comment: Good eccentric and concentric control with transfers  Ambulation/Gait Ambulation/Gait assistance: Supervision Gait Distance (Feet): 200 Feet x 1 with a RW, 100' x 1 with a rail, 50' x 1 without an AD. Assistive device: Rolling walker (2 wheeled) Gait Pattern/deviations: Step-through pattern;Decreased step length - right;Decreased step length - left Gait velocity: decreased   General Gait Details: Slow cadence with amb with a RW with short B step length but no ataxia or instability noted.  Stairs Stairs: Yes Stairs assistance: Supervision Stair Management: Two rails;Alternating pattern Number of Stairs: 4 General stair comments: Good concentric control and fair eccentric control during stair training with alternating pattern  Wheelchair Mobility    Modified Rankin (Stroke Patients Only)       Balance Overall balance assessment: Needs assistance   Sitting balance-Leahy Scale: Normal     Standing balance support: Bilateral upper extremity supported Standing balance-Leahy Scale: Good                               Pertinent Vitals/Pain Pain Assessment: No/denies pain    Home Living Family/patient expects to be discharged to:: Private residence Living Arrangements: Alone Available Help at Discharge: Family;Available PRN/intermittently Type of Home: Apartment Home Access: Stairs to enter Entrance Stairs-Rails: Right;Can reach both;Left Entrance Stairs-Number of Steps: 5  Home Layout: One level Home Equipment: Cane - single point      Prior Function Level of Independence: Independent         Comments: Pt Ind with amb community distances without an AD, no other falls other than recent fall associated with this admission, Ind with ADLs, works at [employer] 2 days/wk involving a lot of walking       Hand Dominance   Dominant Hand: Right    Extremity/Trunk Assessment   Upper Extremity Assessment Upper Extremity Assessment: Defer to OT evaluation    Lower Extremity Assessment Lower Extremity Assessment: Overall WFL for tasks assessed       Communication   Communication: Expressive difficulties  Cognition Arousal/Alertness: Awake/alert Behavior During Therapy: WFL for tasks assessed/performed Overall Cognitive Status: Within Functional Limits for tasks assessed                                        General Comments      Exercises Other Exercises Other Exercises: Gait training with BUE support, single UE support, and no UE support   Assessment/Plan    PT Assessment Patient needs continued PT services  PT Problem List Decreased balance;Decreased knowledge of use of DME       PT Treatment Interventions DME instruction;Gait training;Stair training;Functional mobility training;Therapeutic activities;Therapeutic exercise;Balance training;Patient/family education    PT Goals (Current goals can be found in the Care Plan section)  Acute Rehab PT Goals Patient Stated Goal: To return home PT Goal Formulation: With patient Time For Goal Achievement: 04/05/18 Potential to Achieve Goals: Good    Frequency 7X/week   Barriers to discharge        Co-evaluation               AM-PAC PT "6 Clicks" Mobility  Outcome Measure Help needed turning from your back to your side while in a flat bed without using bedrails?: None Help needed moving from lying on your back to sitting on the side of a flat bed without using bedrails?: A Little Help needed moving to and from a bed to a chair (including a wheelchair)?: A Little Help needed standing up from a chair using your arms (e.g., wheelchair or bedside chair)?: A Little Help needed to walk in hospital room?: A Little Help needed climbing 3-5 steps with a railing? : A Little 6 Click Score: 19    End  of Session Equipment Utilized During Treatment: Gait belt Activity Tolerance: Patient tolerated treatment well Patient left: in bed;with call bell/phone within reach;with bed alarm set Nurse Communication: Mobility status;Other (comment)(Pt requested all bed rails up and raised rails himself, nsg notified) PT Visit Diagnosis: Unsteadiness on feet (R26.81);Difficulty in walking, not elsewhere classified (R26.2)    Time: 1000-1027 PT Time Calculation (min) (ACUTE ONLY): 27 min   Charges:   PT Evaluation $PT Eval Low Complexity: 1 Low PT Treatments $Gait Training: 8-22 mins        D. Scott Agnes Probert PT, DPT 03/23/18, 10:50 AM

## 2018-03-23 NOTE — Care Management Note (Signed)
Case Management Note  Patient Details  Name: Christopher Delacruz MRN: [id number] Date of Birth: Jun 22, 1950  Subjective/Objective:                 Speech much improved today. Patient for discharge home. Agreeable to home health service and agency preference is [facility].  Requested order for PT SLP RN from attending.  Speech will be able to see patient within 24 hours of discharge and physical therapy on 2/27.  Walker delivered to patient's room. Family to transport home  Action/Plan:   Expected Discharge Date:  03/23/18               Expected Discharge Plan:     In-House Referral:     Discharge planning Services  CM Consult  Post Acute Care Choice:  Home Health, Durable Medical Equipment Choice offered to:  Patient  DME Arranged:  [facility]ford Rile DME Agency:  [place] Arranged:  PT RN SLP [facility] Agency:  [place]  Status of Service:  Completed, signed off  If discussed at [place] of Stay Meetings, dates discussed:    Additional Comments:  Katrina Stack, RN 03/23/2018, 6:52 PM

## 2018-03-23 NOTE — Discharge Summary (Signed)
[place] [facility] [place] NAME: Christopher Delacruz    MR#:  [id number]  DATE OF BIRTH:  [birth year]  DATE OF ADMISSION:  03/21/2018   ADMITTING PHYSICIAN: Lance Coon, MD  DATE OF DISCHARGE: 03/23/2018 PRIMARY CARE PHYSICIAN: Donnie Coffin, MD   ADMISSION DIAGNOSIS:  Dizziness [R42] Mild dehydration [E86.0] Cerebrovascular accident (CVA), unspecified mechanism ([place]) [I63.9] DISCHARGE DIAGNOSIS:  Principal Problem:   Cerebral infarction ([place]) Active Problems:   Diabetes ([place])   HTN (hypertension)   Acute renal insufficiency   HLD (hyperlipidemia)   CVA (cerebral vascular accident) ([place])   Acute CVA (cerebrovascular accident) ([place])  SECONDARY DIAGNOSIS:   Past Medical History:  Diagnosis Date  . CVA (cerebral vascular accident) ([place])   . Diabetes mellitus ([place])   . Hyperlipidemia   . Hypertension    HOSPITAL COURSE:  Acute CVA -seen on MRI in the left parietal region.  Continue aspirin and Lipitor, added Plavix per Dr. Doy Mince. PT evaluation suggest home health and PT.    Acute renal failure. Improving with IV fluids, hold lisinopril.  Improving with IV fluids.   Diabetes ([place]) -sliding scale insulin coverage   HTN (hypertension). resume norvasc (increase to10 mg po daily). Hold lisinopril due to renal failure.  Follow-up PCP to resume.   HLD (hyperlipidemia) -continue Lipitor. DISCHARGE CONDITIONS:  Discharge to home with home health and PT. CONSULTS OBTAINED:  Treatment Team:  [name], MD DRUG ALLERGIES:  No Known Allergies DISCHARGE MEDICATIONS:   Allergies as of 03/23/2018   No Known Allergies     Medication List    TAKE these medications   amLODipine 5 MG tablet Commonly known as:  NORVASC Take 5 mg by mouth daily.   aspirin 81 MG chewable tablet Chew 1 tablet (81 mg total) by mouth daily.   atorvastatin 80 MG tablet Commonly known as:  LIPITOR Take 80 mg by mouth at bedtime.   clopidogrel 75 MG  tablet Commonly known as:  PLAVIX Take 1 tablet (75 mg total) by mouth daily.   lisinopril 20 MG tablet Commonly known as:  PRINIVIL,ZESTRIL Take 1 tablet (20 mg total) by mouth 2 (two) times daily.   ondansetron 4 MG tablet Commonly known as:  ZOFRAN Take 1 tablet (4 mg total) by mouth every 6 (six) hours as needed for nausea.   senna-docusate 8.6-50 MG tablet Commonly known as:  Senokot-S Take 1 tablet by mouth at bedtime as needed for mild constipation.   tamsulosin 0.4 MG Caps capsule Commonly known as:  FLOMAX Take 1 capsule (0.4 mg total) by mouth daily.        DISCHARGE INSTRUCTIONS:  See AVS.  If you experience worsening of your admission symptoms, develop shortness of breath, life threatening emergency, suicidal or homicidal thoughts you must seek medical attention immediately by calling 911 or calling your MD immediately  if symptoms less severe.  You Must read complete instructions/literature along with all the possible adverse reactions/side effects for all the Medicines you take and that have been prescribed to you. Take any new Medicines after you have completely understood and accpet all the possible adverse reactions/side effects.   Please note  You were cared for by a hospitalist during your hospital stay. If you have any questions about your discharge medications or the care you received while you were in the hospital after you are discharged, you can call the unit and asked to speak with the hospitalist on call if the hospitalist that  took care of you is not available. Once you are discharged, your primary care physician will handle any further medical issues. Please note that NO REFILLS for any discharge medications will be authorized once you are discharged, as it is imperative that you return to your primary care physician (or establish a relationship with a primary care physician if you do not have one) for your aftercare needs so that they can reassess your  need for medications and monitor your lab values.    On the day of Discharge:  VITAL SIGNS:  Blood pressure (!) 145/93, pulse 70, temperature 97.8 F (36.6 C), temperature source Oral, resp. rate 18, height 6\' 2"  (1.88 m), weight 117.9 kg, SpO2 93 %. PHYSICAL EXAMINATION:  GENERAL:  68 y.o.-year-old patient lying in the bed with no acute distress.  EYES: Pupils equal, round, reactive to light and accommodation. No scleral icterus. Extraocular muscles intact.  HEENT: Head atraumatic, normocephalic. Oropharynx and nasopharynx clear.  NECK:  Supple, no jugular venous distention. No thyroid enlargement, no tenderness.  LUNGS: Normal breath sounds bilaterally, no wheezing, rales,rhonchi or crepitation. No use of accessory muscles of respiration.  CARDIOVASCULAR: S1, S2 normal. No murmurs, rubs, or gallops.  ABDOMEN: Soft, non-tender, non-distended. Bowel sounds present. No organomegaly or mass.  EXTREMITIES: No pedal edema, cyanosis, or clubbing.  NEUROLOGIC: Cranial nerves II through XII are intact. Muscle strength 5/5 in all extremities. Sensation intact. Gait not checked.  PSYCHIATRIC: The patient is alert and oriented x 3.  SKIN: No obvious rash, lesion, or ulcer.  DATA REVIEW:   CBC Recent Labs  Lab 03/23/18 0727  WBC 9.1  HGB 12.3*  HCT 39.8  PLT 238    Chemistries  Recent Labs  Lab 03/23/18 0727  NA 140  K 3.8  CL 109  CO2 27  GLUCOSE 98  BUN 19  CREATININE 1.56*  CALCIUM 8.4*     Microbiology Results  Results for orders placed or performed during the hospital encounter of 06/23/15  MRSA PCR Screening     Status: None   Collection Time: 06/24/15  2:06 AM  Result Value Ref Range Status   MRSA by PCR NEGATIVE NEGATIVE Final    Comment:        The GeneXpert MRSA Assay (FDA approved for NASAL specimens only), is one component of a comprehensive MRSA colonization surveillance program. It is not intended to diagnose MRSA infection nor to guide or monitor  treatment for MRSA infections.     RADIOLOGY:  Mr Jodene Nam Head/brain WU Cm  Result Date: 03/22/2018 CLINICAL DATA:  [place] a few days ago. Intermittent left-sided weakness. Tiny left parietal infarction shown by MRI. EXAM: MRA HEAD WITHOUT CONTRAST TECHNIQUE: Angiographic images of the Circle of Willis were obtained using MRA technique without intravenous contrast. COMPARISON:  MRI yesterday. FINDINGS: Both internal carotid arteries are widely patent into the brain. No siphon stenosis. The anterior and middle cerebral vessels are patent without proximal stenosis, aneurysm or vascular malformation. Both vertebral arteries are widely patent to the basilar. No basilar stenosis. Posterior circulation branch vessels appear normal. IMPRESSION: Negative MR angiography of the large and medium size vessels. Electronically Signed   By: Nelson Chimes M.D.   On: 03/22/2018 14:12     Management plans discussed with the patient, family and they are in agreement.  CODE STATUS: Full Code   TOTAL TIME TAKING CARE OF THIS PATIENT: 32 minutes.    Demetrios Loll M.D on 03/23/2018 at 11:47 AM  Between 7am to 6pm -  Pager - [phone]  After 6pm go to [email or web address] - Technical brewer [facility]  Office  [phone]  CC: Primary care physician; Donnie Coffin, MD   Note: This dictation was prepared with Dragon dictation along with smaller phrase technology. Any transcriptional errors that result from this process are unintentional.

## 2018-03-23 NOTE — Progress Notes (Signed)
Subjective: Patient without new neurological complaints  Objective: Current vital signs: BP (!) 145/93 (BP Location: Left Arm)   Pulse 70   Temp 97.8 F (36.6 C) (Oral)   Resp 18   Ht 6\' 2"  (1.88 m)   Wt 117.9 kg   SpO2 93%   BMI 33.38 kg/m  Vital signs in last 24 hours: Temp:  [97.7 F (36.5 C)-98.2 F (36.8 C)] 97.8 F (36.6 C) (02/25 1000) Pulse Rate:  [60-70] 70 (02/25 1000) Resp:  [18] 18 (02/25 1000) BP: (145-168)/(77-93) 145/93 (02/25 1000) SpO2:  [91 %-94 %] 93 % (02/25 1000)  Intake/Output from previous day: 02/24 0701 - 02/25 0700 In: 240 [P.O.:240] Out: -  Intake/Output this shift: No intake/output data recorded. Nutritional status:  Diet Order            Diet - low sodium heart healthy        Diet Heart Room service appropriate? Yes; Fluid consistency: Thin  Diet effective now              Neurologic Exam: Mental Status: Alert, oriented, thought content appropriate.  Speech fluent without evidence of aphasia but dysarthric.  Able to follow 3 step commands without difficulty. Cranial Nerves: II: Discs flat bilaterally; Visual fields grossly normal, pupils equal, round, reactive to light and accommodation III,IV, VI: ptosis not present, extra-ocular motions intact bilaterally V,VII: smile symmetric, facial light touch sensation normal bilaterally VIII: hearing normal bilaterally IX,X: gag reflex present XI: bilateral shoulder shrug XII: midline tongue extension Motor: Right : Upper extremity   5/5    Left:     Upper extremity   5/5  Lower extremity   5/5     Lower extremity   5/5 Tone and bulk:normal tone throughout; no atrophy noted Sensory: Pinprick and light touch intact throughout, bilaterally Cerebellar: normal finger-to-nose and normal heel-to-shin testing   Lab Results: Basic Metabolic Panel: Recent Labs  Lab 03/21/18 1548 03/23/18 0727  NA 139 140  K 3.9 3.8  CL 104 109  CO2 27 27  GLUCOSE 157* 98  BUN 24* 19  CREATININE  1.98* 1.56*  CALCIUM 9.0 8.4*    Liver Function Tests: No results for input(s): AST, ALT, ALKPHOS, BILITOT, PROT, ALBUMIN in the last 168 hours. No results for input(s): LIPASE, AMYLASE in the last 168 hours. No results for input(s): AMMONIA in the last 168 hours.  CBC: Recent Labs  Lab 03/21/18 1548 03/23/18 0727  WBC 13.1* 9.1  HGB 13.8 12.3*  HCT 44.7 39.8  MCV 87.6 87.1  PLT 314 238    Cardiac Enzymes: Recent Labs  Lab 03/21/18 1548  TROPONINI <0.03    Lipid Panel: Recent Labs  Lab 03/22/18 0558  CHOL 106  TRIG 58  HDL 37*  CHOLHDL 2.9  VLDL 12  LDLCALC 57    CBG: No results for input(s): GLUCAP in the last 168 hours.  Microbiology: Results for orders placed or performed during the hospital encounter of 06/23/15  MRSA PCR Screening     Status: None   Collection Time: 06/24/15  2:06 AM  Result Value Ref Range Status   MRSA by PCR NEGATIVE NEGATIVE Final    Comment:        The GeneXpert MRSA Assay (FDA approved for NASAL specimens only), is one component of a comprehensive MRSA colonization surveillance program. It is not intended to diagnose MRSA infection nor to guide or monitor treatment for MRSA infections.     Coagulation Studies: No results for  input(s): LABPROT, INR in the last 72 hours.  Imaging: Dg Chest 2 View  Result Date: 03/21/2018 CLINICAL DATA:  Fall EXAM: CHEST - 2 VIEW COMPARISON:  06/23/2015 FINDINGS: Heart is normal size. Mild elevation of the left hemidiaphragm with left base atelectasis or scarring. Right lung clear. No effusions or acute bony abnormality. IMPRESSION: Left base atelectasis or scarring.  No active disease. Electronically Signed   By: Rolm Baptise M.D.   On: 03/21/2018 16:52   Ct Head Wo Contrast  Result Date: 03/21/2018 CLINICAL DATA:  Left-sided hip pain after fall on Friday. EXAM: CT HEAD WITHOUT CONTRAST TECHNIQUE: Contiguous axial images were obtained from the base of the skull through the vertex  without intravenous contrast. COMPARISON:  CT head dated June 29, 2015. FINDINGS: Brain: No evidence of acute infarction, hemorrhage, hydrocephalus, extra-axial collection or mass lesion/mass effect. Old infarct in the left basal ganglia with compensatory dilatation of the left frontal horn. Progressive moderate chronic microvascular ischemic changes. Unchanged mild diffuse cerebral atrophy. Vascular: Atherosclerotic vascular calcification of the carotid siphons. No hyperdense vessel. Skull: Normal. Negative for fracture or focal lesion. Sinuses/Orbits: No acute finding. 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 Other: None. IMPRESSION: 1.  No acute intracranial abnormality. 2. Old left basal ganglia infarct. Progressive moderate chronic microvascular ischemic changes. Electronically Signed   By: Titus Dubin M.D.   On: 03/21/2018 16:25   Mr Brain Wo Contrast  Result Date: 03/21/2018 CLINICAL DATA:  68 y/o M; fall, some slight balance trouble for the last couple of days, prior stroke. Exclude stroke. EXAM: MRI HEAD WITHOUT CONTRAST TECHNIQUE: Multiplanar, multiecho pulse sequences of the brain and surrounding structures were obtained without intravenous contrast. COMPARISON:  03/21/2018 CT head.  06/24/2015 MRI head. FINDINGS: Brain: Punctate focus of reduced diffusion within left medial parietal white matter compatible with acute/early subacute infarction (series 2, image 41). No associated hemorrhage or mass effect. Chronic left anterior basal ganglia posterior limb of internal capsule infarction with wallerian degeneration extending into cerebral peduncle and brainstem. Very small chronic infarction within left posterior midbrain. Large confluent nonspecific T2 FLAIR hyperintensities in subcortical and periventricular white matter are compatible with advanced chronic microvascular ischemic changes. Moderate volume loss of the brain. Hemosiderin staining is present within the left anterior basal  ganglia chronic infarction as well as within the left superior cerebellar peduncle. No acute hemorrhage, extra-axial collection, hydrocephalus, or herniation. No focal mass effect. Vascular: Normal flow voids. Skull and upper cervical spine: Normal marrow signal. Sinuses/Orbits: Negative. Other: None. IMPRESSION: 1. Punctate acute/early subacute infarction within left medial parietal white matter. No hemorrhage or mass effect. 2. Advanced chronic microvascular ischemic changes and moderate volume loss of the brain. Chronic infarcts within left anterior basal ganglia and left posterior midbrain. Electronically Signed   By: Kristine Garbe M.D.   On: 03/21/2018 22:33   US Carotid Bilateral (at [facility] Only)  Result Date: 03/22/2018 CLINICAL DATA:  Stroke.  Hypertension, hyperlipidemia, diabetes. EXAM: BILATERAL CAROTID DUPLEX ULTRASOUND TECHNIQUE: Pearline Cables scale imaging, color Doppler and duplex ultrasound were performed of bilateral carotid and vertebral arteries in the neck. COMPARISON:  06/24/2015 FINDINGS: Criteria: Quantification of carotid stenosis is based on velocity parameters that correlate the residual internal carotid diameter with NASCET-based stenosis levels, using the diameter of the distal internal carotid lumen as the denominator for stenosis measurement. The following velocity measurements were obtained: RIGHT ICA: 84/17 cm/sec CCA: 95/09 cm/sec SYSTOLIC ICA/CCA RATIO:  0.9 ECA: 120 cm/sec LEFT ICA: 74/16 cm/sec CCA: 74/11  cm/sec SYSTOLIC ICA/CCA RATIO:  1.0 ECA: 89 cm/sec RIGHT CAROTID ARTERY: Mild eccentric plaque in the carotid bulb. No high-grade stenosis. Normal waveforms and color Doppler signal. RIGHT VERTEBRAL ARTERY:  Normal flow direction and waveform. LEFT CAROTID ARTERY: Intimal thickening in the common carotid artery. Minimal eccentric plaque in the bulb. No high-grade stenosis. Normal waveforms and color Doppler signal. Distal ICA tortuous. LEFT VERTEBRAL ARTERY:  Normal  flow direction and waveform. IMPRESSION: 1. Mild bilateral carotid bifurcation plaque resulting in less than 50% diameter stenosis. 2.  Antegrade bilateral vertebral arterial flow. Electronically Signed   By: Lucrezia Europe M.D.   On: 03/22/2018 09:07   Mr Jodene Nam Head/brain KC Cm  Result Date: 03/22/2018 CLINICAL DATA:  [place] a few days ago. Intermittent left-sided weakness. Tiny left parietal infarction shown by MRI. EXAM: MRA HEAD WITHOUT CONTRAST TECHNIQUE: Angiographic images of the Circle of Willis were obtained using MRA technique without intravenous contrast. COMPARISON:  MRI yesterday. FINDINGS: Both internal carotid arteries are widely patent into the brain. No siphon stenosis. The anterior and middle cerebral vessels are patent without proximal stenosis, aneurysm or vascular malformation. Both vertebral arteries are widely patent to the basilar. No basilar stenosis. Posterior circulation branch vessels appear normal. IMPRESSION: Negative MR angiography of the large and medium size vessels. Electronically Signed   By: Nelson Chimes M.D.   On: 03/22/2018 14:12    Medications:  I have reviewed the patient's current medications. Scheduled: . amLODipine  5 mg Oral Daily  . aspirin  81 mg Oral Daily  . atorvastatin  80 mg Oral QHS  . clopidogrel  75 mg Oral Daily  . heparin  5,000 Units Subcutaneous Q8H  . tamsulosin  0.4 mg Oral Daily    Assessment/Plan: Patient without focal complaints today.  Reports that speech is dysarthric at baseline.  A1c 5.2.  Carotid dopplers show no evidence of hemodynamically significant stenosis.  Echocardiogram shows no cardiac source of emboli with an EF of 55-60%. EEG shows no epileptiform activity.    Recommendations: 1.  Continue statin, ASA and Plavix  2.  Patient to follow up with neurology on an outpatient basis   LOS: 1 day   Alexis Goodell, MD Neurology [phone] 03/23/2018  1:46 PM

## 2018-10-02 ENCOUNTER — Emergency Department: Payer: Medicare Other

## 2018-10-02 ENCOUNTER — Other Ambulatory Visit: Payer: Self-pay

## 2018-10-02 ENCOUNTER — Emergency Department
Admission: EM | Admit: 2018-10-02 | Discharge: 2018-10-02 | Disposition: A | Discharge status: Home / Self Care | Payer: Medicare Other | Attending: Emergency Medicine | Admitting: Emergency Medicine

## 2018-10-02 ENCOUNTER — Encounter: Payer: Self-pay | Admitting: Physician Assistant

## 2018-10-02 DIAGNOSIS — I6782 Cerebral ischemia: Secondary | ICD-10-CM | POA: Insufficient documentation

## 2018-10-02 DIAGNOSIS — M25461 Effusion, right knee: Secondary | ICD-10-CM | POA: Insufficient documentation

## 2018-10-02 DIAGNOSIS — Z7982 Long term (current) use of aspirin: Secondary | ICD-10-CM | POA: Diagnosis not present

## 2018-10-02 DIAGNOSIS — Z79899 Other long term (current) drug therapy: Secondary | ICD-10-CM | POA: Diagnosis not present

## 2018-10-02 DIAGNOSIS — I10 Essential (primary) hypertension: Secondary | ICD-10-CM | POA: Diagnosis not present

## 2018-10-02 DIAGNOSIS — E119 Type 2 diabetes mellitus without complications: Secondary | ICD-10-CM | POA: Diagnosis not present

## 2018-10-02 DIAGNOSIS — R51 Headache: Secondary | ICD-10-CM | POA: Diagnosis not present

## 2018-10-02 DIAGNOSIS — S8991XA Unspecified injury of right lower leg, initial encounter: Secondary | ICD-10-CM | POA: Diagnosis present

## 2018-10-02 DIAGNOSIS — Z8673 Personal history of transient ischemic attack (TIA), and cerebral infarction without residual deficits: Secondary | ICD-10-CM | POA: Insufficient documentation

## 2018-10-02 DIAGNOSIS — Y999 Unspecified external cause status: Secondary | ICD-10-CM | POA: Insufficient documentation

## 2018-10-02 DIAGNOSIS — Y929 Unspecified place or not applicable: Secondary | ICD-10-CM | POA: Insufficient documentation

## 2018-10-02 DIAGNOSIS — Y939 Activity, unspecified: Secondary | ICD-10-CM | POA: Diagnosis not present

## 2018-10-02 LAB — BASIC METABOLIC PANEL
Anion gap: 8 (ref 5–15)
BUN: 17 mg/dL (ref 8–23)
CO2: 26 mmol/L (ref 22–32)
Calcium: 8.9 mg/dL (ref 8.9–10.3)
Chloride: 111 mmol/L (ref 98–111)
Creatinine, Ser: 1.67 mg/dL — ABNORMAL HIGH (ref 0.61–1.24)
GFR calc Af Amer: 48 mL/min — ABNORMAL LOW (ref >60)
GFR calc non Af Amer: 41 mL/min — ABNORMAL LOW (ref >60)
Glucose, Bld: 113 mg/dL — ABNORMAL HIGH (ref 70–99)
Potassium: 3.6 mmol/L (ref 3.5–5.1)
Sodium: 145 mmol/L (ref 135–145)

## 2018-10-02 LAB — CBC WITH DIFFERENTIAL/PLATELET
Abs Immature Granulocytes: 0.02 10*3/uL (ref 0.00–0.07)
Basophils Absolute: 0.1 10*3/uL (ref 0.0–0.1)
Basophils Relative: 1 %
Eosinophils Absolute: 0.2 10*3/uL (ref 0.0–0.5)
Eosinophils Relative: 2 %
HCT: 38 % — ABNORMAL LOW (ref 39.0–52.0)
Hemoglobin: 11.9 g/dL — ABNORMAL LOW (ref 13.0–17.0)
Immature Granulocytes: 0 %
Lymphocytes Relative: 21 %
Lymphs Abs: 1.7 10*3/uL (ref 0.7–4.0)
MCH: 26.9 pg (ref 26.0–34.0)
MCHC: 31.3 g/dL (ref 30.0–36.0)
MCV: 85.8 fL (ref 80.0–100.0)
Monocytes Absolute: 0.7 10*3/uL (ref 0.1–1.0)
Monocytes Relative: 8 %
Neutro Abs: 5.5 10*3/uL (ref 1.7–7.7)
Neutrophils Relative %: 68 %
Platelets: 241 10*3/uL (ref 150–400)
RBC: 4.43 MIL/uL (ref 4.22–5.81)
RDW: 12.9 % (ref 11.5–15.5)
WBC: 8.2 10*3/uL (ref 4.0–10.5)
nRBC: 0 % (ref 0.0–0.2)

## 2018-10-02 MED ORDER — TRAMADOL HCL 50 MG PO TABS
50.0000 mg | ORAL_TABLET | Freq: Once | ORAL | Status: AC
  Administered 2018-10-02: 50 mg via ORAL
  Filled 2018-10-02: qty 1

## 2018-10-02 MED ORDER — TRAMADOL HCL 50 MG PO TABS: 50.0000 mg | ORAL_TABLET | Freq: Three times a day (TID) | ORAL | 0 refills | Status: DC | PRN

## 2018-10-02 NOTE — ED Notes (Signed)
This RN called pt's family upon his request, spoke with Kamarrion Stfort and her husband Virgal Warmuth
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
## 2019-03-25 ENCOUNTER — Other Ambulatory Visit: Payer: Self-pay

## 2019-03-25 ENCOUNTER — Ambulatory Visit (INDEPENDENT_AMBULATORY_CARE_PROVIDER_SITE_OTHER): Payer: Medicare Other | Admitting: Urology

## 2019-03-25 ENCOUNTER — Encounter: Payer: Self-pay | Admitting: Urology

## 2019-03-25 VITALS — BP 150/70 | HR 78 | Ht 74.0 in | Wt 250.0 lb

## 2019-03-25 DIAGNOSIS — R351 Nocturia: Secondary | ICD-10-CM | POA: Diagnosis not present

## 2019-03-25 LAB — URINALYSIS, COMPLETE
Bilirubin, UA: NEGATIVE
Glucose, UA: NEGATIVE
Ketones, UA: NEGATIVE
Leukocytes,UA: NEGATIVE
Nitrite, UA: NEGATIVE
Specific Gravity, UA: 1.025 (ref 1.005–1.030)
Urobilinogen, Ur: 0.2 mg/dL (ref 0.2–1.0)
pH, UA: 5.5 (ref 5.0–7.5)

## 2019-03-25 LAB — MICROSCOPIC EXAMINATION: Bacteria, UA: NONE SEEN

## 2019-03-25 LAB — BLADDER SCAN AMB NON-IMAGING: Scan Result: 79

## 2019-03-25 NOTE — Progress Notes (Signed)
   03/25/2019 2:21 PM   Christopher Delacruz [birth year] [id number]  Referring provider: Donnie Coffin, MD [facility] [place],  [place] [address]  Chief Complaint  Patient presents with  . Benign Prostatic Hypertrophy    HPI: 69 y.o. male previously seen by Dr. Matilde Sprang October 2019 for urinary frequency and nocturia.  He is on tamsulosin.  He was referred again by his PCP for persistent urinary frequency and ED however the patient states today he has no bothersome lower urinary tract symptoms and does not desire any treatment for ED.  His states his main complaint is diarrhea.  Denies dysuria or gross hematuria.  No flank, abdominal or pelvic pain.   PMH: Past Medical History:  Diagnosis Date  . CVA (cerebral vascular accident) ([place])   . Diabetes mellitus ([place])   . Hyperlipidemia   . Hypertension     Surgical History: Past Surgical History:  Procedure Laterality Date  . EXPLORE EYE SOCKET      Home Medications:  Allergies as of 03/25/2019   No Known Allergies     Medication List       Accurate as of March 25, 2019  2:21 PM. If you have any questions, ask your nurse or doctor.        amLODipine 10 MG tablet Commonly known as: NORVASC Take 1 tablet (10 mg total) by mouth daily.   aspirin 81 MG chewable tablet Chew 1 tablet (81 mg total) by mouth daily.   atorvastatin 80 MG tablet Commonly known as: LIPITOR Take 80 mg by mouth at bedtime.   clopidogrel 75 MG tablet Commonly known as: PLAVIX Take 1 tablet (75 mg total) by mouth daily.   ondansetron 4 MG tablet Commonly known as: ZOFRAN Take 1 tablet (4 mg total) by mouth every 6 (six) hours as needed for nausea.   senna-docusate 8.6-50 MG tablet Commonly known as: Senokot-S Take 1 tablet by mouth at bedtime as needed for mild constipation.   tamsulosin 0.4 MG Caps capsule Commonly known as: FLOMAX Take 1 capsule (0.4 mg total) by mouth daily.   traMADol 50 MG tablet Commonly known as:  Ultram Take 1 tablet (50 mg total) by mouth 3 (three) times daily as needed.       Allergies: No Known Allergies  Family History: No family history on file.  Social History:  reports that he has never smoked. He has never used smokeless tobacco. He reports that he does not drink alcohol or use drugs.   Physical Exam: BP (!) 150/70   Pulse 78   Ht 6\' 2"  (1.88 m)   Wt 250 lb (113.4 kg)   BMI 32.10 kg/m   Constitutional:  Alert and oriented, No acute distress.   Assessment & Plan:   He denies bothersome lower urinary tract symptoms today and complains of bothersome diarrhea.  Recommend he follow-up with PCP for further evaluation.  Return here as needed.  He does not need a new referral and can call back for an appointment.  Abbie Sons, [place] [address], [place], [place] [address] [phone]

## 2019-03-27 ENCOUNTER — Encounter: Payer: Self-pay | Admitting: Urology

## 2019-08-10 ENCOUNTER — Other Ambulatory Visit: Payer: Self-pay

## 2019-08-10 ENCOUNTER — Ambulatory Visit (INDEPENDENT_AMBULATORY_CARE_PROVIDER_SITE_OTHER): Payer: Medicare Other | Admitting: Gastroenterology

## 2019-08-10 VITALS — BP 107/74 | HR 69 | Temp 98.3°F | Ht 74.0 in | Wt 231.0 lb

## 2019-08-10 DIAGNOSIS — R194 Change in bowel habit: Secondary | ICD-10-CM

## 2019-08-10 DIAGNOSIS — R634 Abnormal weight loss: Secondary | ICD-10-CM | POA: Diagnosis not present

## 2019-08-10 NOTE — Progress Notes (Signed)
Christopher Bellows MD, MRCP(U.K) [place] [facility], [place] [address]  Main: [phone]  Fax: [phone]   Gastroenterology Consultation  Referring Provider:     Donnie Coffin, MD Primary Care Physician:  Christopher Coffin, MD Primary Gastroenterologist:  Dr. Jonathon Delacruz  Reason for Consultation:     Fecal incontinence        HPI:   Christopher Delacruz is a 69 y.o. y/o male referred for consultation & management  by Dr. Clide Delacruz, Christopher Lynch, MD.    He has been referred for fecal incontinence  He is a very hard historian.  Was not very clear if he was having more frequent bowel movements or less frequent bowel movements.  He did mention that his bowel movements were normal the last 2 days but prior to that was finding it very hard to have a bowel movement was passing very small quantities.  But also at some point he was incontinent.  He denies ever having a colonoscopy.  He states the shape of his stool is pencillike.  Denies any rectal bleeding.  He says he has lost a lot of weight.  Past Medical History:  Diagnosis Date  . CVA (cerebral vascular accident) ([place])   . Diabetes mellitus ([place])   . Hyperlipidemia   . Hypertension     Past Surgical History:  Procedure Laterality Date  . EXPLORE EYE SOCKET      Prior to Admission medications   Medication Sig Start Date End Date Taking? Authorizing Provider  amLODipine (NORVASC) 10 MG tablet Take 1 tablet (10 mg total) by mouth daily. 03/23/18   Christopher Loll, MD  aspirin 81 MG chewable tablet Chew 1 tablet (81 mg total) by mouth daily. 06/26/15   Christopher Grist, MD  atorvastatin (LIPITOR) 80 MG tablet Take 80 mg by mouth at bedtime. 04/11/15   [provider]  clopidogrel (PLAVIX) 75 MG tablet Take 1 tablet (75 mg total) by mouth daily. 03/23/18   Christopher Loll, MD  ondansetron (ZOFRAN) 4 MG tablet Take 1 tablet (4 mg total) by mouth every 6 (six) hours as needed for nausea. 06/26/15   Christopher Grist, MD  senna-docusate  (SENOKOT-S) 8.6-50 MG tablet Take 1 tablet by mouth at bedtime as needed for mild constipation. 06/26/15   Christopher Grist, MD  tamsulosin (FLOMAX) 0.4 MG CAPS capsule Take 1 capsule (0.4 mg total) by mouth daily. 11/30/17   Christopher Loser, MD  traMADol (ULTRAM) 50 MG tablet Take 1 tablet (50 mg total) by mouth 3 (three) times daily as needed. 10/02/18   Christopher Delacruz, Dannielle Karvonen, PA-C    No family history on file.   Social History   Tobacco Use  . Smoking status: Never Smoker  . Smokeless tobacco: Never Used  Vaping Use  . Vaping Use: Never used  Substance Use Topics  . Alcohol use: Never    Alcohol/week: 0.0 standard drinks  . Drug use: Never    Allergies as of 08/10/2019  . (No Known Allergies)    Review of Systems:    All systems reviewed and negative except where noted in HPI.   Physical Exam:  There were no vitals taken for this visit. No LMP for male patient. Psych:  Alert and cooperative. Normal mood and affect. General:   Alert,  Well-developed, well-nourished, pleasant and cooperative in NAD Head:  Normocephalic and atraumatic. Eyes:  Sclera clear, no icterus.   Conjunctiva pink. Lungs:  Respirations even and unlabored.  Clear  throughout to auscultation.   No wheezes, crackles, or rhonchi. No acute distress. Heart:  Regular rate and rhythm; no murmurs, clicks, rubs, or gallops. Abdomen:  Normal bowel sounds.  No bruits.  Soft, non-tender and non-distended without masses, hepatosplenomegaly or hernias noted.  No guarding or rebound tenderness.    Neurologic:  Alert and oriented x3;  grossly normal neurologically. Psych:  Alert and cooperative. Normal mood and affect.  Imaging Studies: No results found.  Assessment and Plan:   Christopher Delacruz is a 69 y.o. y/o male has been referred for fecal incontinence.  He is a very poor historian.  History was suggestive of constipation as well as certain episodes of incontinence.  Never had a colonoscopy.  He is tentative lost a lot  of weight.  Differentials include neoplasm versus constipation with overflow diarrhea.  Plan 1.  Colonoscopy in the next 2 to 3 weeks with any available physician 2.  High-fiber diet, fiber pills will be provided.  Follow up in 6 to 8 weeks  Dr Christopher Bellows MD,MRCP(U.K)

## 2019-08-19 ENCOUNTER — Other Ambulatory Visit
Admission: RE | Admit: 2019-08-19 | Discharge: 2019-08-19 | Disposition: A | Discharge status: Home / Self Care | Payer: Medicare Other | Source: Ambulatory Visit | Attending: Gastroenterology | Admitting: Gastroenterology

## 2019-08-19 ENCOUNTER — Other Ambulatory Visit: Payer: Self-pay

## 2019-08-19 DIAGNOSIS — Z01812 Encounter for preprocedural laboratory examination: Secondary | ICD-10-CM | POA: Insufficient documentation

## 2019-08-19 DIAGNOSIS — Z20822 Contact with and (suspected) exposure to covid-19: Secondary | ICD-10-CM | POA: Diagnosis not present

## 2019-08-20 LAB — SARS CORONAVIRUS 2 (TAT 6-24 HRS): SARS Coronavirus 2: NEGATIVE

## 2019-09-06 ENCOUNTER — Other Ambulatory Visit: Payer: Self-pay

## 2019-09-06 ENCOUNTER — Encounter: Payer: Self-pay | Admitting: Emergency Medicine

## 2019-09-06 DIAGNOSIS — Z20822 Contact with and (suspected) exposure to covid-19: Secondary | ICD-10-CM | POA: Diagnosis present

## 2019-09-06 DIAGNOSIS — D62 Acute posthemorrhagic anemia: Secondary | ICD-10-CM | POA: Diagnosis present

## 2019-09-06 DIAGNOSIS — R5381 Other malaise: Secondary | ICD-10-CM | POA: Diagnosis present

## 2019-09-06 DIAGNOSIS — Z806 Family history of leukemia: Secondary | ICD-10-CM

## 2019-09-06 DIAGNOSIS — K264 Chronic or unspecified duodenal ulcer with hemorrhage: Secondary | ICD-10-CM | POA: Diagnosis present

## 2019-09-06 DIAGNOSIS — R918 Other nonspecific abnormal finding of lung field: Secondary | ICD-10-CM | POA: Diagnosis present

## 2019-09-06 DIAGNOSIS — Z539 Procedure and treatment not carried out, unspecified reason: Secondary | ICD-10-CM | POA: Diagnosis not present

## 2019-09-06 DIAGNOSIS — N1831 Chronic kidney disease, stage 3a: Secondary | ICD-10-CM | POA: Diagnosis present

## 2019-09-06 DIAGNOSIS — Z79899 Other long term (current) drug therapy: Secondary | ICD-10-CM

## 2019-09-06 DIAGNOSIS — C19 Malignant neoplasm of rectosigmoid junction: Secondary | ICD-10-CM | POA: Diagnosis not present

## 2019-09-06 DIAGNOSIS — K922 Gastrointestinal hemorrhage, unspecified: Secondary | ICD-10-CM | POA: Diagnosis not present

## 2019-09-06 DIAGNOSIS — Z7982 Long term (current) use of aspirin: Secondary | ICD-10-CM

## 2019-09-06 DIAGNOSIS — Z8249 Family history of ischemic heart disease and other diseases of the circulatory system: Secondary | ICD-10-CM

## 2019-09-06 DIAGNOSIS — E1122 Type 2 diabetes mellitus with diabetic chronic kidney disease: Secondary | ICD-10-CM | POA: Diagnosis present

## 2019-09-06 DIAGNOSIS — K2971 Gastritis, unspecified, with bleeding: Secondary | ICD-10-CM | POA: Diagnosis present

## 2019-09-06 DIAGNOSIS — Z7902 Long term (current) use of antithrombotics/antiplatelets: Secondary | ICD-10-CM

## 2019-09-06 DIAGNOSIS — N400 Enlarged prostate without lower urinary tract symptoms: Secondary | ICD-10-CM | POA: Diagnosis present

## 2019-09-06 DIAGNOSIS — D509 Iron deficiency anemia, unspecified: Secondary | ICD-10-CM | POA: Diagnosis present

## 2019-09-06 DIAGNOSIS — Z823 Family history of stroke: Secondary | ICD-10-CM

## 2019-09-06 DIAGNOSIS — K635 Polyp of colon: Secondary | ICD-10-CM | POA: Diagnosis present

## 2019-09-06 DIAGNOSIS — I129 Hypertensive chronic kidney disease with stage 1 through stage 4 chronic kidney disease, or unspecified chronic kidney disease: Secondary | ICD-10-CM | POA: Diagnosis present

## 2019-09-06 DIAGNOSIS — B9681 Helicobacter pylori [H. pylori] as the cause of diseases classified elsewhere: Secondary | ICD-10-CM | POA: Diagnosis present

## 2019-09-06 DIAGNOSIS — I69351 Hemiplegia and hemiparesis following cerebral infarction affecting right dominant side: Secondary | ICD-10-CM

## 2019-09-06 DIAGNOSIS — R159 Full incontinence of feces: Secondary | ICD-10-CM | POA: Diagnosis present

## 2019-09-06 DIAGNOSIS — F0150 Vascular dementia without behavioral disturbance: Secondary | ICD-10-CM | POA: Diagnosis present

## 2019-09-06 DIAGNOSIS — I6389 Other cerebral infarction: Secondary | ICD-10-CM | POA: Diagnosis present

## 2019-09-06 DIAGNOSIS — E785 Hyperlipidemia, unspecified: Secondary | ICD-10-CM | POA: Diagnosis present

## 2019-09-06 DIAGNOSIS — N179 Acute kidney failure, unspecified: Secondary | ICD-10-CM | POA: Diagnosis present

## 2019-09-06 DIAGNOSIS — Z638 Other specified problems related to primary support group: Secondary | ICD-10-CM

## 2019-09-06 DIAGNOSIS — D519 Vitamin B12 deficiency anemia, unspecified: Secondary | ICD-10-CM | POA: Diagnosis present

## 2019-09-06 LAB — CBC WITH DIFFERENTIAL/PLATELET
Abs Immature Granulocytes: 0.02 10*3/uL (ref 0.00–0.07)
Basophils Absolute: 0.1 10*3/uL (ref 0.0–0.1)
Basophils Relative: 1 %
Eosinophils Absolute: 0.3 10*3/uL (ref 0.0–0.5)
Eosinophils Relative: 4 %
HCT: 23.4 % — ABNORMAL LOW (ref 39.0–52.0)
Hemoglobin: 6.7 g/dL — ABNORMAL LOW (ref 13.0–17.0)
Immature Granulocytes: 0 %
Lymphocytes Relative: 26 %
Lymphs Abs: 2.3 10*3/uL (ref 0.7–4.0)
MCH: 21.1 pg — ABNORMAL LOW (ref 26.0–34.0)
MCHC: 28.6 g/dL — ABNORMAL LOW (ref 30.0–36.0)
MCV: 73.6 fL — ABNORMAL LOW (ref 80.0–100.0)
Monocytes Absolute: 0.8 10*3/uL (ref 0.1–1.0)
Monocytes Relative: 8 %
Neutro Abs: 5.4 10*3/uL (ref 1.7–7.7)
Neutrophils Relative %: 61 %
Platelets: 299 10*3/uL (ref 150–400)
RBC: 3.18 MIL/uL — ABNORMAL LOW (ref 4.22–5.81)
RDW: 18 % — ABNORMAL HIGH (ref 11.5–15.5)
WBC: 8.9 10*3/uL (ref 4.0–10.5)
nRBC: 0 % (ref 0.0–0.2)

## 2019-09-06 LAB — COMPREHENSIVE METABOLIC PANEL
ALT: 10 U/L (ref 0–44)
AST: 16 U/L (ref 15–41)
Albumin: 3.2 g/dL — ABNORMAL LOW (ref 3.5–5.0)
Alkaline Phosphatase: 65 U/L (ref 38–126)
Anion gap: 7 (ref 5–15)
BUN: 25 mg/dL — ABNORMAL HIGH (ref 8–23)
CO2: 26 mmol/L (ref 22–32)
Calcium: 8.5 mg/dL — ABNORMAL LOW (ref 8.9–10.3)
Chloride: 109 mmol/L (ref 98–111)
Creatinine, Ser: 2.35 mg/dL — ABNORMAL HIGH (ref 0.61–1.24)
GFR calc Af Amer: 31 mL/min — ABNORMAL LOW (ref >60)
GFR calc non Af Amer: 27 mL/min — ABNORMAL LOW (ref >60)
Glucose, Bld: 99 mg/dL (ref 70–99)
Potassium: 4 mmol/L (ref 3.5–5.1)
Sodium: 142 mmol/L (ref 135–145)
Total Bilirubin: 0.8 mg/dL (ref 0.3–1.2)
Total Protein: 6.6 g/dL (ref 6.5–8.1)

## 2019-09-06 LAB — LIPASE, BLOOD: Lipase: 69 U/L — ABNORMAL HIGH (ref 11–51)

## 2019-09-06 NOTE — ED Triage Notes (Signed)
Pt to triage via w/c with no distress noted; pt reports some recent incontinence of loose stool for several days; pt denies any other accomp symptoms

## 2019-09-07 ENCOUNTER — Other Ambulatory Visit: Payer: Self-pay

## 2019-09-07 ENCOUNTER — Encounter: Payer: Self-pay | Admitting: Internal Medicine

## 2019-09-07 ENCOUNTER — Inpatient Hospital Stay
Admission: EM | Admit: 2019-09-07 | Discharge: 2019-09-16 | DRG: 356 | Disposition: A | Discharge status: Skilled Nursing Facility | Payer: Medicare Other | Attending: Family Medicine | Admitting: Family Medicine

## 2019-09-07 DIAGNOSIS — B9681 Helicobacter pylori [H. pylori] as the cause of diseases classified elsewhere: Secondary | ICD-10-CM

## 2019-09-07 DIAGNOSIS — N400 Enlarged prostate without lower urinary tract symptoms: Secondary | ICD-10-CM | POA: Diagnosis present

## 2019-09-07 DIAGNOSIS — K264 Chronic or unspecified duodenal ulcer with hemorrhage: Secondary | ICD-10-CM | POA: Diagnosis present

## 2019-09-07 DIAGNOSIS — D500 Iron deficiency anemia secondary to blood loss (chronic): Secondary | ICD-10-CM | POA: Diagnosis not present

## 2019-09-07 DIAGNOSIS — R5381 Other malaise: Secondary | ICD-10-CM | POA: Diagnosis present

## 2019-09-07 DIAGNOSIS — R918 Other nonspecific abnormal finding of lung field: Secondary | ICD-10-CM | POA: Diagnosis present

## 2019-09-07 DIAGNOSIS — Z539 Procedure and treatment not carried out, unspecified reason: Secondary | ICD-10-CM | POA: Diagnosis not present

## 2019-09-07 DIAGNOSIS — N179 Acute kidney failure, unspecified: Secondary | ICD-10-CM | POA: Diagnosis present

## 2019-09-07 DIAGNOSIS — Z823 Family history of stroke: Secondary | ICD-10-CM | POA: Diagnosis not present

## 2019-09-07 DIAGNOSIS — N401 Enlarged prostate with lower urinary tract symptoms: Secondary | ICD-10-CM | POA: Diagnosis not present

## 2019-09-07 DIAGNOSIS — E785 Hyperlipidemia, unspecified: Secondary | ICD-10-CM | POA: Diagnosis present

## 2019-09-07 DIAGNOSIS — D490 Neoplasm of unspecified behavior of digestive system: Secondary | ICD-10-CM

## 2019-09-07 DIAGNOSIS — I639 Cerebral infarction, unspecified: Secondary | ICD-10-CM | POA: Diagnosis not present

## 2019-09-07 DIAGNOSIS — K635 Polyp of colon: Secondary | ICD-10-CM | POA: Diagnosis not present

## 2019-09-07 DIAGNOSIS — Z7982 Long term (current) use of aspirin: Secondary | ICD-10-CM | POA: Diagnosis not present

## 2019-09-07 DIAGNOSIS — K2970 Gastritis, unspecified, without bleeding: Secondary | ICD-10-CM

## 2019-09-07 DIAGNOSIS — R531 Weakness: Secondary | ICD-10-CM

## 2019-09-07 DIAGNOSIS — C20 Malignant neoplasm of rectum: Secondary | ICD-10-CM | POA: Diagnosis not present

## 2019-09-07 DIAGNOSIS — Z7189 Other specified counseling: Secondary | ICD-10-CM | POA: Diagnosis not present

## 2019-09-07 DIAGNOSIS — I6389 Other cerebral infarction: Secondary | ICD-10-CM | POA: Diagnosis present

## 2019-09-07 DIAGNOSIS — K922 Gastrointestinal hemorrhage, unspecified: Secondary | ICD-10-CM | POA: Diagnosis present

## 2019-09-07 DIAGNOSIS — C19 Malignant neoplasm of rectosigmoid junction: Secondary | ICD-10-CM | POA: Diagnosis present

## 2019-09-07 DIAGNOSIS — Z515 Encounter for palliative care: Secondary | ICD-10-CM

## 2019-09-07 DIAGNOSIS — Z79899 Other long term (current) drug therapy: Secondary | ICD-10-CM | POA: Diagnosis not present

## 2019-09-07 DIAGNOSIS — D62 Acute posthemorrhagic anemia: Secondary | ICD-10-CM | POA: Diagnosis present

## 2019-09-07 DIAGNOSIS — Z7902 Long term (current) use of antithrombotics/antiplatelets: Secondary | ICD-10-CM | POA: Diagnosis not present

## 2019-09-07 DIAGNOSIS — Z95828 Presence of other vascular implants and grafts: Secondary | ICD-10-CM

## 2019-09-07 DIAGNOSIS — I10 Essential (primary) hypertension: Secondary | ICD-10-CM | POA: Diagnosis not present

## 2019-09-07 DIAGNOSIS — Z20822 Contact with and (suspected) exposure to covid-19: Secondary | ICD-10-CM | POA: Diagnosis present

## 2019-09-07 DIAGNOSIS — D518 Other vitamin B12 deficiency anemias: Secondary | ICD-10-CM | POA: Diagnosis not present

## 2019-09-07 DIAGNOSIS — D649 Anemia, unspecified: Secondary | ICD-10-CM

## 2019-09-07 DIAGNOSIS — N1831 Chronic kidney disease, stage 3a: Secondary | ICD-10-CM

## 2019-09-07 DIAGNOSIS — I69351 Hemiplegia and hemiparesis following cerebral infarction affecting right dominant side: Secondary | ICD-10-CM | POA: Diagnosis not present

## 2019-09-07 DIAGNOSIS — D509 Iron deficiency anemia, unspecified: Secondary | ICD-10-CM

## 2019-09-07 DIAGNOSIS — Z806 Family history of leukemia: Secondary | ICD-10-CM | POA: Diagnosis not present

## 2019-09-07 DIAGNOSIS — R159 Full incontinence of feces: Secondary | ICD-10-CM | POA: Diagnosis present

## 2019-09-07 DIAGNOSIS — K2971 Gastritis, unspecified, with bleeding: Secondary | ICD-10-CM | POA: Diagnosis present

## 2019-09-07 DIAGNOSIS — Z8249 Family history of ischemic heart disease and other diseases of the circulatory system: Secondary | ICD-10-CM | POA: Diagnosis not present

## 2019-09-07 DIAGNOSIS — C187 Malignant neoplasm of sigmoid colon: Secondary | ICD-10-CM | POA: Diagnosis not present

## 2019-09-07 DIAGNOSIS — F0150 Vascular dementia without behavioral disturbance: Secondary | ICD-10-CM | POA: Diagnosis present

## 2019-09-07 HISTORY — DX: Acute kidney failure, unspecified: N17.9

## 2019-09-07 HISTORY — DX: Chronic kidney disease, stage 3a: N18.31

## 2019-09-07 LAB — CBC
HCT: 25.5 % — ABNORMAL LOW (ref 39.0–52.0)
HCT: 30.8 % — ABNORMAL LOW (ref 39.0–52.0)
HCT: 28 % — ABNORMAL LOW (ref 39.0–52.0)
Hemoglobin: 7.6 g/dL — ABNORMAL LOW (ref 13.0–17.0)
Hemoglobin: 9.1 g/dL — ABNORMAL LOW (ref 13.0–17.0)
Hemoglobin: 8.5 g/dL — ABNORMAL LOW (ref 13.0–17.0)
MCH: 21.3 pg — ABNORMAL LOW (ref 26.0–34.0)
MCH: 22.5 pg — ABNORMAL LOW (ref 26.0–34.0)
MCH: 22.4 pg — ABNORMAL LOW (ref 26.0–34.0)
MCHC: 29.8 g/dL — ABNORMAL LOW (ref 30.0–36.0)
MCHC: 29.5 g/dL — ABNORMAL LOW (ref 30.0–36.0)
MCHC: 30.4 g/dL (ref 30.0–36.0)
MCV: 71.6 fL — ABNORMAL LOW (ref 80.0–100.0)
MCV: 76 fL — ABNORMAL LOW (ref 80.0–100.0)
MCV: 73.9 fL — ABNORMAL LOW (ref 80.0–100.0)
Platelets: 304 10*3/uL (ref 150–400)
Platelets: 304 10*3/uL (ref 150–400)
Platelets: 292 10*3/uL (ref 150–400)
RBC: 3.56 MIL/uL — ABNORMAL LOW (ref 4.22–5.81)
RBC: 4.05 MIL/uL — ABNORMAL LOW (ref 4.22–5.81)
RBC: 3.79 MIL/uL — ABNORMAL LOW (ref 4.22–5.81)
RDW: 18 % — ABNORMAL HIGH (ref 11.5–15.5)
RDW: 18.7 % — ABNORMAL HIGH (ref 11.5–15.5)
RDW: 18.4 % — ABNORMAL HIGH (ref 11.5–15.5)
WBC: 8.3 10*3/uL (ref 4.0–10.5)
WBC: 9.4 10*3/uL (ref 4.0–10.5)
WBC: 10.3 10*3/uL (ref 4.0–10.5)
nRBC: 0 % (ref 0.0–0.2)
nRBC: 0 % (ref 0.0–0.2)
nRBC: 0 % (ref 0.0–0.2)

## 2019-09-07 LAB — HIV ANTIBODY (ROUTINE TESTING W REFLEX): HIV Screen 4th Generation wRfx: NONREACTIVE

## 2019-09-07 LAB — RETICULOCYTES
Immature Retic Fract: 20.4 % — ABNORMAL HIGH (ref 2.3–15.9)
RBC.: 3.46 MIL/uL — ABNORMAL LOW (ref 4.22–5.81)
Retic Count, Absolute: 43.6 10*3/uL (ref 19.0–186.0)
Retic Ct Pct: 1.3 % (ref 0.4–3.1)

## 2019-09-07 LAB — PREPARE RBC (CROSSMATCH)

## 2019-09-07 LAB — PROTIME-INR
INR: 1 (ref 0.8–1.2)
Prothrombin Time: 13 seconds (ref 11.4–15.2)

## 2019-09-07 LAB — ABO/RH: ABO/RH(D): B NEG

## 2019-09-07 LAB — APTT: aPTT: 30 seconds (ref 24–36)

## 2019-09-07 LAB — IRON AND TIBC
Iron: 12 ug/dL — ABNORMAL LOW (ref 45–182)
Saturation Ratios: 3 % — ABNORMAL LOW (ref 17.9–39.5)
TIBC: 393 ug/dL (ref 250–450)
UIBC: 381 ug/dL

## 2019-09-07 LAB — VITAMIN B12: Vitamin B-12: 148 pg/mL — ABNORMAL LOW (ref 180–914)

## 2019-09-07 LAB — FOLATE: Folate: 11.7 ng/mL (ref >5.9)

## 2019-09-07 LAB — SARS CORONAVIRUS 2 BY RT PCR (HOSPITAL ORDER, PERFORMED IN ~~LOC~~ HOSPITAL LAB): SARS Coronavirus 2: NEGATIVE

## 2019-09-07 LAB — FERRITIN: Ferritin: 6 ng/mL — ABNORMAL LOW (ref 24–336)

## 2019-09-07 MED ORDER — HYDRALAZINE HCL 20 MG/ML IJ SOLN
5.0000 mg | INTRAMUSCULAR | Status: DC | PRN
  Administered 2019-09-07 – 2019-09-08 (×2): 5 mg via INTRAVENOUS
  Filled 2019-09-07 (×2): qty 1

## 2019-09-07 MED ORDER — ONDANSETRON HCL 4 MG/2ML IJ SOLN: 4.0000 mg | Freq: Three times a day (TID) | INTRAMUSCULAR | Status: DC | PRN

## 2019-09-07 MED ORDER — SODIUM CHLORIDE 0.9 % IV BOLUS
500.0000 mL | Freq: Once | INTRAVENOUS | Status: AC
  Administered 2019-09-07: 500 mL via INTRAVENOUS

## 2019-09-07 MED ORDER — SODIUM CHLORIDE 0.9 % IV SOLN
10.0000 mL/h | Freq: Once | INTRAVENOUS | Status: AC
  Administered 2019-09-07: 10 mL/h via INTRAVENOUS

## 2019-09-07 MED ORDER — ACETAMINOPHEN 325 MG PO TABS: 650.0000 mg | ORAL_TABLET | Freq: Four times a day (QID) | ORAL | Status: DC | PRN

## 2019-09-07 MED ORDER — SODIUM CHLORIDE 0.9 % IV SOLN: INTRAVENOUS | Status: DC

## 2019-09-07 NOTE — Consult Note (Signed)
GI Inpatient Consult Note  Reason for Consult: UGI Bleed   Attending Requesting Consult: Dr. Ivor Costa  History of Present Illness: Christopher Delacruz is a 69 y.o. male seen for evaluation of UGI bleed at the request of Dr. Blaine Hamper. Pt has a PMH of Hx of CVA on Plavix and ASA, HTN, HLD, CKD Stage III, BPH, and diet-controlled diabetes mellitus. Pt presented to the ED from home for chief complaint of fecal incontinence and generalized weakness. Upon presentation to the ED, he was found to have anemia with hemoglobin 6.7 from 11.9 in September 2020. ED physician reported fecal occult blood test positive. He was transfused 2 units pRBCs. Per nursing report, he has had several maroon-colored stools this morning. He was cleaned of dark brown and red stool this morning at 0930. He denies any abdominal pain, rectal pain, nausea, vomiting, fevers, chills, or night sweats. He saw Dr. [name] 08/10/19 as outpatient for changes in bowel habits where he was reporting some pencil-like stools, fecal incontinence, and weight loss. He was scheduled for elective colonoscopy 08/19 with Dr. Vicente Males and advised to increase fiber intake in his diet. He reports he has not started a fiber supplement or high-fiber diet. His main complaint is inability to control his bowel movements and reports he can never seem to make it to the toilet in time. He is colonoscopy naive and denies any known family history of colon cancer, adenomatous polyps, or malignancy. No other specific complaints offered.   Past Medical History:  Past Medical History:  Diagnosis Date   CVA (cerebral vascular accident) ([place])    Diabetes mellitus ([place])    Hyperlipidemia    Hypertension     Problem List: Patient Active Problem List   Diagnosis Date Noted   Upper GI bleed 09/07/2019   BPH (benign prostatic hyperplasia) 09/07/2019   Acute renal failure superimposed on stage 3a chronic kidney disease ([place]) 09/07/2019   Microcytic anemia 09/07/2019   GIB  (gastrointestinal bleeding) 09/07/2019   CVA (cerebral vascular accident) ([place]) 03/22/2018   Acute CVA (cerebrovascular accident) ([place]) 03/22/2018   HLD (hyperlipidemia) 03/21/2018   Right sided weakness 06/26/2015   Dysphagia 06/26/2015   Essential hypertension, malignant 06/26/2015   Acute renal insufficiency 06/26/2015   Hypokalemia 06/26/2015   Cerebral infarction ([place]) 06/23/2015   Diabetes ([place]) 06/23/2015   HTN (hypertension) 06/23/2015    Past Surgical History: Past Surgical History:  Procedure Laterality Date   EXPLORE EYE SOCKET      Allergies: No Known Allergies  Home Medications: Medications Prior to Admission  Medication Sig Dispense Refill Last Dose   amLODipine (NORVASC) 10 MG tablet Take 1 tablet (10 mg total) by mouth daily. 30 tablet 1 09/06/2019 at Unknown time   clopidogrel (PLAVIX) 75 MG tablet Take 1 tablet (75 mg total) by mouth daily. 30 tablet 1 09/06/2019 at Unknown time   finasteride (PROSCAR) 5 MG tablet Take 5 mg by mouth daily.   09/06/2019 at Unknown time   tamsulosin (FLOMAX) 0.4 MG CAPS capsule Take 1 capsule (0.4 mg total) by mouth daily. 30 capsule 11 09/06/2019 at Unknown time   Home medication reconciliation was completed with the patient.   Scheduled Inpatient Medications:    Continuous Inpatient Infusions:    sodium chloride 75 mL/hr at 09/07/19 1412    PRN Inpatient Medications:  acetaminophen, hydrALAZINE, ondansetron (ZOFRAN) IV  Family History: family history includes Heart attack in his sister; Leukemia in his brother; Stroke in his brother.  The patient's family history  is negative for inflammatory bowel disorders, GI malignancy, or solid organ transplantation.  Social History:   reports that he has never smoked. He has never used smokeless tobacco. He reports that he does not drink alcohol and does not use drugs. The patient denies ETOH, tobacco, or drug use.   Review of Systems: Constitutional: +weight  loss Eyes: No changes in vision. ENT: No oral lesions, sore throat.  GI: see HPI.  Heme/Lymph: No easy bruising.  CV: No chest pain.  GU: No hematuria.  Integumentary: No rashes.  Neuro: No headaches.  Psych: No depression/anxiety.  Endocrine: No heat/cold intolerance.  Allergic/Immunologic: No urticaria.  Resp: No cough, SOB.  Musculoskeletal: No joint swelling.    Physical Examination: BP (!) 183/74    Pulse 63    Temp (!) 97.4 F (36.3 C) (Oral)    Resp 20    Ht 6' (1.829 m)    Wt 113.4 kg    SpO2 99%    BMI 33.91 kg/m   Pleasant, non-toxic appearing male in hospital bed. Difficult historian, but does answer questions appropriately.  Gen: NAD, alert and oriented x 4 HEENT: PEERLA, EOMI, Neck: supple, no JVD or thyromegaly Chest: CTA bilaterally, no wheezes, crackles, or other adventitious sounds CV: RRR, no m/g/c/r Abd: soft, NT, ND, +BS in all four quadrants; no HSM, guarding, ridigity, or rebound tenderness Ext: no edema, well perfused with 2+ pulses, Skin: no rash or lesions noted Lymph: no LAD  Data: Lab Results  Component Value Date   WBC 9.4 09/07/2019   HGB 9.1 (L) 09/07/2019   HCT 30.8 (L) 09/07/2019   MCV 76.0 (L) 09/07/2019   PLT 304 09/07/2019   Recent Labs  Lab 09/06/19 1935 09/07/19 0722 09/07/19 1430  HGB 6.7* 7.6* 9.1*   Lab Results  Component Value Date   NA 142 09/06/2019   K 4.0 09/06/2019   CL 109 09/06/2019   CO2 26 09/06/2019   BUN 25 (H) 09/06/2019   CREATININE 2.35 (H) 09/06/2019   Lab Results  Component Value Date   ALT 10 09/06/2019   AST 16 09/06/2019   ALKPHOS 65 09/06/2019   BILITOT 0.8 09/06/2019   Recent Labs  Lab 09/07/19 1430  APTT 30  INR 1.0   Assessment/Plan:  69 y/o AA male with a PMH of Hx of CVA on Plavix and ASA, HTN, HLD, CKD Stage III, BPH, and diet-controlled diabetes mellitus presented to the ED for chief complaint of fecal incontinence   1. Lower GI Bleeding - DDx includes diverticular bleed, AVMs,  polyp, malignancy, IBD, anal outlet etiology, small bowel source, or less likely UGI bleed  2. Acute blood loss anemia - on admission 6.7 from baseline 11.9 09/2018. He is s/p 2 units pRBCs with post-transfusion Hgb 7.6.  3. Fecal incontinence - DDx includes chronic constipation with overflow, decreased sphincter control, malignancy, pelvic floor dysfunction, nerve injury, etc  4. Chronic antiplatelet therapy with Plavix - Last dose yesterday  Recommendations:  -Continue to monitor H&H closely and transfuse as needed to maintain Hgb >7.0 -Advise colonoscopy when clinically feasible. Ideally after holding Plavix 48-72 hours. -If evidence of acute bleeding, would recommend [facility] GI Bleeding scan and consult vascular surgery if positive -Clear liquid diet for now -Continue serial abdominal examinations and following H&H -Following along with you  Thank you for the consult. Please call with questions or concerns.  Reeves Forth [facility] [phone] (Cell)

## 2019-09-07 NOTE — ED Provider Notes (Signed)
[facility] Emergency Department Provider Note  ____________________________________________   First MD Initiated Contact with Patient 09/07/19 [phone]     (approximate)  I have reviewed the triage vital signs and the nursing notes.   HISTORY  Chief Complaint Diarrhea   HPI Christopher Delacruz is a 69 y.o. male with below list of previous medical conditions including CVA diabetes mellitus hyperlipidemia hypertension presents to the emergency department secondary to incontinence of stool for "a while".  Patient states that symptoms of worsened over the last few days.  Patient denies any abdominal pain no nausea or vomiting or diarrhea.  Patient denies any urinary symptoms.  Patient denies any chest pain or shortness of breath.  Patient does however admit to generalized fatigue and weakness.  Patient denies any blood noted in the stool.        Past Medical History:  Diagnosis Date  . CVA (cerebral vascular accident) ([place])   . Diabetes mellitus ([place])   . Hyperlipidemia   . Hypertension     Patient Active Problem List   Diagnosis Date Noted  . CVA (cerebral vascular accident) ([place]) 03/22/2018  . Acute CVA (cerebrovascular accident) ([place]) 03/22/2018  . HLD (hyperlipidemia) 03/21/2018  . Right sided weakness 06/26/2015  . Dysphagia 06/26/2015  . Essential hypertension, malignant 06/26/2015  . Acute renal insufficiency 06/26/2015  . Hypokalemia 06/26/2015  . Cerebral infarction ([place]) 06/23/2015  . Diabetes ([place]) 06/23/2015  . HTN (hypertension) 06/23/2015    Past Surgical History:  Procedure Laterality Date  . EXPLORE EYE SOCKET      Prior to Admission medications   Medication Sig Start Date End Date Taking? Authorizing Provider  amLODipine (NORVASC) 10 MG tablet Take 1 tablet (10 mg total) by mouth daily. 03/23/18   Demetrios Loll, MD  aspirin 81 MG chewable tablet Chew 1 tablet (81 mg total) by mouth daily. 06/26/15   Theodoro Grist, MD  atorvastatin  (LIPITOR) 80 MG tablet Take 80 mg by mouth at bedtime. 04/11/15   [provider]  clopidogrel (PLAVIX) 75 MG tablet Take 1 tablet (75 mg total) by mouth daily. 03/23/18   Demetrios Loll, MD  ondansetron (ZOFRAN) 4 MG tablet Take 1 tablet (4 mg total) by mouth every 6 (six) hours as needed for nausea. 06/26/15   Theodoro Grist, MD  senna-docusate (SENOKOT-S) 8.6-50 MG tablet Take 1 tablet by mouth at bedtime as needed for mild constipation. 06/26/15   Theodoro Grist, MD  tamsulosin (FLOMAX) 0.4 MG CAPS capsule Take 1 capsule (0.4 mg total) by mouth daily. 11/30/17   Bjorn Loser, MD  traMADol (ULTRAM) 50 MG tablet Take 1 tablet (50 mg total) by mouth 3 (three) times daily as needed. 10/02/18   Menshew, Dannielle Karvonen, PA-C    Allergies Patient has no known allergies.  No family history on file.  Social History Social History   Tobacco Use  . Smoking status: Never Smoker  . Smokeless tobacco: Never Used  Vaping Use  . Vaping Use: Never used  Substance Use Topics  . Alcohol use: Never    Alcohol/week: 0.0 standard drinks  . Drug use: Never    Review of Systems Constitutional: No fever/chills Eyes: No visual changes. ENT: No sore throat. Cardiovascular: Denies chest pain. Respiratory: Denies shortness of breath. Gastrointestinal: No abdominal pain.  No nausea, no vomiting.  No diarrhea.  No constipation. Genitourinary: Negative for dysuria. Musculoskeletal: Negative for neck pain.  Negative for back pain. Integumentary: Negative for rash. Neurological: Negative for headaches, focal  weakness or numbness.   ____________________________________________   PHYSICAL EXAM:  VITAL SIGNS: ED Triage Vitals  Enc Vitals Group     BP 09/06/19 1925 (!) 156/76     Pulse Rate 09/06/19 1925 (!) 54     Resp 09/06/19 1925 20     Temp 09/06/19 1925 98.7 F (37.1 C)     Temp Source 09/06/19 1925 Oral     SpO2 09/06/19 1925 98 %     Weight 09/06/19 1928 113.4 kg (250 lb)     Height  09/06/19 1928 1.829 m (6')     Head Circumference --      Peak Flow --      Pain Score 09/06/19 1928 0     Pain Loc --      Pain Edu? --      Excl. in [place]? --     Constitutional: Alert and oriented.  Eyes: Conjunctivae are normal.  Head: Atraumatic. Mouth/Throat: Patient is wearing a mask. Neck: No stridor.  No meningeal signs.   Cardiovascular: Normal rate, regular rhythm. Good peripheral circulation. Grossly normal heart sounds. Respiratory: Normal respiratory effort.  No retractions. Gastrointestinal: Soft and nontender. No distention.  Guaiac positive stool Musculoskeletal: No lower extremity tenderness nor edema. No gross deformities of extremities. Neurologic:  Normal speech and language. No gross focal neurologic deficits are appreciated.  Skin:  Skin is warm, dry and intact. Psychiatric: Mood and affect are normal. Speech and behavior are normal.  ____________________________________________   LABS (all labs ordered are listed, but only abnormal results are displayed)  Labs Reviewed  CBC WITH DIFFERENTIAL/PLATELET - Abnormal; Notable for the following components:      Result Value   RBC 3.18 (*)    Hemoglobin 6.7 (*)    HCT 23.4 (*)    MCV 73.6 (*)    MCH 21.1 (*)    MCHC 28.6 (*)    RDW 18.0 (*)    All other components within normal limits  COMPREHENSIVE METABOLIC PANEL - Abnormal; Notable for the following components:   BUN 25 (*)    Creatinine, Ser 2.35 (*)    Calcium 8.5 (*)    Albumin 3.2 (*)    GFR calc non Af Amer 27 (*)    GFR calc Af Amer 31 (*)    All other components within normal limits  LIPASE, BLOOD - Abnormal; Notable for the following components:   Lipase 69 (*)    All other components within normal limits     Procedures   ____________________________________________   INITIAL IMPRESSION / MDM / ASSESSMENT AND PLAN / ED COURSE  As part of my medical decision making, I reviewed the following data within the electronic MEDICAL RECORD NUMBER     69 year old male presented with above-stated history and physical exam secondary to "fecal incontinence.  Laboratory data revealed hemoglobin of 6.7 which is decreased from the patient's previous hemoglobin of 11.9 on October 02, 2018.  Patient guaiac positive.  Spoke with the patient at length and obtain consent for blood transfusion.  2 units packed red blood cells ordered.  Patient subsequently discussed with hospitalist after hospital admission for further evaluation and management.  ____________________________________________  FINAL CLINICAL IMPRESSION(S) / ED DIAGNOSES  Final diagnoses:  Gastrointestinal hemorrhage, unspecified gastrointestinal hemorrhage type     MEDICATIONS GIVEN DURING THIS VISIT:  Medications - No data to display   ED Discharge Orders    None      *Please note:  Christopher Delacruz was evaluated in  Emergency Department on 09/07/2019 for the symptoms described in the history of present illness. He was evaluated in the context of the global COVID-19 pandemic, which necessitated consideration that the patient might be at risk for infection with the SARS-CoV-2 virus that causes COVID-19. Institutional protocols and algorithms that pertain to the evaluation of patients at risk for COVID-19 are in a state of rapid change based on information released by regulatory bodies including the CDC and federal and state organizations. These policies and algorithms were followed during the patient's care in the ED.  Some ED evaluations and interventions may be delayed as a result of limited staffing during and after the pandemic.*  Note:  This document was prepared using Dragon voice recognition software and may include unintentional dictation errors.   Gregor Hams, MD 09/07/19 [phone]

## 2019-09-07 NOTE — ED Notes (Signed)
PT cleansed of incontinent stool

## 2019-09-07 NOTE — H&P (Signed)
History and Physical    Christopher Delacruz QIO:[id number] DOB: 1950/02/15 DOA: 09/07/2019  Referring MD/NP/PA:   PCP: Donnie Coffin, MD   Patient coming from:  The patient is coming from home.  At baseline, pt is independent for most of ADL.        Chief Complaint: Fecal incontinence and bloody diarrhea    HPI: Christopher Delacruz is a 69 y.o. male with medical history significant of stroke with mild right-sided weakness on Plavix, hypertension, hyperlipidemia, diet-controlled diabetes, upper GI bleeding, BPH, CKD stage III, who presents with rectal incontinence and bloody diarrhea.  Per his nephew (I called his nephew by phone), patient called his nephew and I told him that he had incontinence of stool and wanted to go to hospital.  Patient has generalized weakness.  Per ED RN, pt has bloody diarrhea in the emergency room with dark-colored blood. Patient had mild lower abdominal pain, but he cannot give detailed information to further characterize abdominal pain.  No nausea, vomiting.  Patient denies chest pain, shortness breath, cough, fever or chills.  No symptoms of UTI.   Patient was found to have positive FOBT. His hemoglobin dropped from 11.9 on 10/02/2018 to 6.7 in ED.   ED Course: pt was found to have WBC 8.9, pending COVID-19 PCR, worsening renal function, temperature normal, blood pressure 169/68, heart rate 55, 77, RR 20, oxygen saturation 99% on room air.  Patient is admitted to [facility] bed as inpatient. Dr. Alice Reichert of GI is consulted.    Review of Systems:   General: no fevers, chills, no body weight gain, has fatigue HEENT: no blurry vision, hearing changes or sore throat Respiratory: no dyspnea, coughing, wheezing CV: no chest pain, no palpitations GI: no nausea, vomiting, has abdominal pain, bloody diarrhea, no constipation GU: no dysuria, burning on urination, increased urinary frequency, hematuria  Ext: no leg edema Neuro: no unilateral weakness, numbness, or tingling, no vision  change or hearing loss Skin: no rash, no skin tear. MSK: No muscle spasm, no deformity, no limitation of range of movement in spin Heme: No easy bruising.  Travel history: No recent long distant travel.  Allergy: No Known Allergies  Past Medical History:  Diagnosis Date  . CVA (cerebral vascular accident) ([place])   . Diabetes mellitus ([place])   . Hyperlipidemia   . Hypertension     Past Surgical History:  Procedure Laterality Date  . EXPLORE EYE SOCKET      Social History:  reports that he has never smoked. He has never used smokeless tobacco. He reports that he does not drink alcohol and does not use drugs.  Family History:  Family History  Problem Relation Age of Onset  . Heart attack Sister   . Leukemia Brother   . Stroke Brother      Prior to Admission medications   Medication Sig Start Date End Date Taking? Authorizing Provider  amLODipine (NORVASC) 10 MG tablet Take 1 tablet (10 mg total) by mouth daily. 03/23/18   Demetrios Loll, MD  aspirin 81 MG chewable tablet Chew 1 tablet (81 mg total) by mouth daily. 06/26/15   Theodoro Grist, MD  atorvastatin (LIPITOR) 80 MG tablet Take 80 mg by mouth at bedtime. 04/11/15   [provider]  clopidogrel (PLAVIX) 75 MG tablet Take 1 tablet (75 mg total) by mouth daily. 03/23/18   Demetrios Loll, MD  finasteride (PROSCAR) 5 MG tablet Take 5 mg by mouth daily. 08/29/19   [provider]  ondansetron (ZOFRAN) 4  MG tablet Take 1 tablet (4 mg total) by mouth every 6 (six) hours as needed for nausea. 06/26/15   Theodoro Grist, MD  senna-docusate (SENOKOT-S) 8.6-50 MG tablet Take 1 tablet by mouth at bedtime as needed for mild constipation. 06/26/15   Theodoro Grist, MD  tamsulosin (FLOMAX) 0.4 MG CAPS capsule Take 1 capsule (0.4 mg total) by mouth daily. 11/30/17   Bjorn Loser, MD  traMADol (ULTRAM) 50 MG tablet Take 1 tablet (50 mg total) by mouth 3 (three) times daily as needed. 10/02/18   Menshew, Dannielle Karvonen, PA-C    Physical  Exam: Vitals:   09/07/19 1113 09/07/19 1141 09/07/19 1141 09/07/19 1412  BP: (!) 186/83 (!) 159/70 (!) 159/70 (!) 183/74  Pulse: 63 67 69 63  Resp: 20 20  20   Temp: 97.9 F (36.6 C) 97.7 F (36.5 C) 97.7 F (36.5 C) (!) 97.4 F (36.3 C)  TempSrc: Oral Oral Oral Oral  SpO2: 99% 100% 100% 99%  Weight:      Height:       General: Not in acute distress. Pale looking. HEENT:       Eyes: PERRL, EOMI, no scleral icterus.       ENT: No discharge from the ears and nose, no pharynx injection, no tonsillar enlargement.        Neck: No JVD, no bruit, no mass felt. Heme: No neck lymph node enlargement. Cardiac: S1/S2, RRR, No murmurs, No gallops or rubs. Respiratory:  No rales, wheezing, rhonchi or rubs. GI: Soft, nondistended, has mild tenderness in lower abdomen, no rebound pain, no organomegaly, BS present. GU: No hematuria Ext: No pitting leg edema bilaterally. 2+DP/PT pulse bilaterally. Musculoskeletal: No joint deformities, No joint redness or warmth, no limitation of ROM in spin. Skin: No rashes.  Neuro: Alert, oriented X3, cranial nerves II-XII grossly intact, has mild right-sided weakness. Psych: Patient is not psychotic, no suicidal or hemocidal ideation.  Labs on Admission: I have personally reviewed following labs and imaging studies  CBC: Recent Labs  Lab 09/06/19 1935 09/07/19 0722 09/07/19 1430  WBC 8.9 8.3 9.4  NEUTROABS 5.4  --   --   HGB 6.7* 7.6* 9.1*  HCT 23.4* 25.5* 30.8*  MCV 73.6* 71.6* 76.0*  PLT 299 304 527   Basic Metabolic Panel: Recent Labs  Lab 09/06/19 1935  NA 142  K 4.0  CL 109  CO2 26  GLUCOSE 99  BUN 25*  CREATININE 2.35*  CALCIUM 8.5*   GFR: Estimated Creatinine Clearance: 38 mL/min (A) (by C-G formula based on SCr of 2.35 mg/dL (H)). Liver Function Tests: Recent Labs  Lab 09/06/19 1935  AST 16  ALT 10  ALKPHOS 65  BILITOT 0.8  PROT 6.6  ALBUMIN 3.2*   Recent Labs  Lab 09/06/19 1935  LIPASE 69*   No results for  input(s): AMMONIA in the last 168 hours. Coagulation Profile: Recent Labs  Lab 09/07/19 1430  INR 1.0   Cardiac Enzymes: No results for input(s): CKTOTAL, CKMB, CKMBINDEX, TROPONINI in the last 168 hours. BNP (last 3 results) No results for input(s): PROBNP in the last 8760 hours. HbA1C: No results for input(s): HGBA1C in the last 72 hours. CBG: No results for input(s): GLUCAP in the last 168 hours. Lipid Profile: No results for input(s): CHOL, HDL, LDLCALC, TRIG, CHOLHDL, LDLDIRECT in the last 72 hours. Thyroid Function Tests: No results for input(s): TSH, T4TOTAL, FREET4, T3FREE, THYROIDAB in the last 72 hours. Anemia Panel: Recent Labs    09/07/19  0350  VITAMINB12 148*  FOLATE 11.7  FERRITIN 6*  TIBC 393  IRON 12*  RETICCTPCT 1.3   Urine analysis:    Component Value Date/Time   COLORURINE YELLOW (A) 06/30/2015 0810   APPEARANCEUR Clear 03/25/2019 1354   LABSPEC 1.023 06/30/2015 0810   PHURINE 5.0 06/30/2015 0810   GLUCOSEU Negative 03/25/2019 1354   HGBUR NEGATIVE 06/30/2015 0810   BILIRUBINUR Negative 03/25/2019 1354   KETONESUR NEGATIVE 06/30/2015 0810   PROTEINUR 3+ (A) 03/25/2019 1354   PROTEINUR >500 (A) 06/30/2015 0810   NITRITE Negative 03/25/2019 1354   NITRITE NEGATIVE 06/30/2015 0810   LEUKOCYTESUR Negative 03/25/2019 1354   Sepsis Labs: @LABRCNTIP (procalcitonin:4,lacticidven:4) ) Recent Results (from the past 240 hour(s))  SARS Coronavirus 2 by RT PCR (hospital order, performed in [place] hospital lab) Nasopharyngeal Nasopharyngeal Swab     Status: None   Collection Time: 09/07/19  7:22 AM   Specimen: Nasopharyngeal Swab  Result Value Ref Range Status   SARS Coronavirus 2 NEGATIVE NEGATIVE Final    Comment: (NOTE) SARS-CoV-2 target nucleic acids are NOT DETECTED.  The SARS-CoV-2 RNA is generally detectable in upper and lower respiratory specimens during the acute phase of infection. The lowest concentration of SARS-CoV-2 viral copies this  assay can detect is 250 copies / mL. A negative result does not preclude SARS-CoV-2 infection and should not be used as the sole basis for treatment or other patient management decisions.  A negative result may occur with improper specimen collection / handling, submission of specimen other than nasopharyngeal swab, presence of viral mutation(s) within the areas targeted by this assay, and inadequate number of viral copies (<250 copies / mL). A negative result must be combined with clinical observations, patient history, and epidemiological information.  Fact Sheet for Patients:   StrictlyIdeas.no  Fact Sheet for Healthcare Providers: BankingDealers.co.za  This test is not yet approved or  cleared by the [place] FDA and has been authorized for detection and/or diagnosis of SARS-CoV-2 by FDA under an Emergency Use Authorization (EUA).  This EUA will remain in effect (meaning this test can be used) for the duration of the COVID-19 declaration under Section 564(b)(1) of the Act, 21 U.S.C. section 360bbb-3(b)(1), unless the authorization is terminated or revoked sooner.  Performed at [facility], [address] [place], [place] [address]      Radiological Exams on Admission: No results found.   EKG:  Not done in ED, will get one.   Assessment/Plan Principal Problem:   GIB (gastrointestinal bleeding) Active Problems:   HTN (hypertension)   HLD (hyperlipidemia)   CVA (cerebral vascular accident) ([place])   BPH (benign prostatic hyperplasia)   Acute renal failure superimposed on stage 3a chronic kidney disease (HCC)   Microcytic anemia   Anemia due to blood loss   GIB (gastrointestinal bleeding), microcytic anemia and anemia due to blood loss.  Patient has bloody diarrhea.  Also has some lower abdominal tenderness. hgb 11.9 -->6.7.  Likely due to lower GI bleeding. Dr. Alice Reichert of GI is consulted  - will admitted to  progressive bed as inpatient - transfuse 2 units of blood now - GI consulted by Ed, will follow up recommendations - IVF: 500 cc of NS bolus, then at 75 mL/hr - Zofran IV for nausea - Avoid NSAIDs and SQ heparin - Maintain IV access (2 large bore IVs if possible). - Monitor closely and follow q6h cbc, transfuse as necessary, if Hgb<7.0 - La[facility]: INR, PTT and type screen -Anemia panel -Hold  Plavix (patient is not taking aspirin currently) -Follow-up C. difficile PCR and GI pathogen panel  HTN (hypertension) -IV hydralazine as needed -Amlodipine  HLD (hyperlipidemia): Patient not taking medications -Follow-up with PCP  History of CVA (cerebral vascular accident) ([place]) -Hold Plavix  BPH (benign prostatic hyperplasia) -Flomax and Proscar  Acute renal failure superimposed on stage 3a chronic kidney disease ([place]): Baseline creatinine 1.5-1.6, his creatinine is at 2.35, BUN 25.  Likely due to dehydration secondary to diarrhea. -Avoid using renal toxic medications -IV fluid as above -Follow-up by BMP     DVT ppx: SCD Code Status: Full code Family Communication:  Yes, patient's nephew by phone Disposition Plan:  Anticipate discharge back to previous home environment Consults called: Dr. Alice Reichert of GI Admission status: Med-surg bed as inpt      Status is: Inpatient  Remains inpatient appropriate because:Hemodynamically unstable.  Patient has multiple comorbidities, now presents with GI bleeding, hemoglobin dropped from 11.9 to 6.7.  Patient will need endoscopy.  His presentation is highly complicated.  Patient will need to be treated in the hospital for at least 2 days.   Dispo: The patient is from: Home              Anticipated d/c is to: Home              Anticipated d/c date is: 2 days              Patient currently is not medically stable to d/c.          Date of Service 09/07/2019    Ivor Costa [facility]   If 7PM-7AM, please contact  night-coverage [email or web address] 09/07/2019, 6:31 PM

## 2019-09-07 NOTE — ED Notes (Signed)
Pts nephew Jamen Loiseau) took pts belongings home: wallet, shoes and pants; okayed by pt. Pts nephew leaving home at this time and requesting for nurse to call with updates. Pts nephew's information in chart demographics.

## 2019-09-07 NOTE — ED Notes (Signed)
Cleansed of dark brown/red stool. New brief applied.

## 2019-09-08 LAB — CBC
HCT: 27 % — ABNORMAL LOW (ref 39.0–52.0)
HCT: 28.8 % — ABNORMAL LOW (ref 39.0–52.0)
HCT: 28.5 % — ABNORMAL LOW (ref 39.0–52.0)
Hemoglobin: 8.1 g/dL — ABNORMAL LOW (ref 13.0–17.0)
Hemoglobin: 8.6 g/dL — ABNORMAL LOW (ref 13.0–17.0)
Hemoglobin: 9.1 g/dL — ABNORMAL LOW (ref 13.0–17.0)
MCH: 22.4 pg — ABNORMAL LOW (ref 26.0–34.0)
MCH: 22.1 pg — ABNORMAL LOW (ref 26.0–34.0)
MCH: 23 pg — ABNORMAL LOW (ref 26.0–34.0)
MCHC: 30 g/dL (ref 30.0–36.0)
MCHC: 29.9 g/dL — ABNORMAL LOW (ref 30.0–36.0)
MCHC: 31.9 g/dL (ref 30.0–36.0)
MCV: 74.8 fL — ABNORMAL LOW (ref 80.0–100.0)
MCV: 73.8 fL — ABNORMAL LOW (ref 80.0–100.0)
MCV: 72 fL — ABNORMAL LOW (ref 80.0–100.0)
Platelets: 284 10*3/uL (ref 150–400)
Platelets: 298 10*3/uL (ref 150–400)
Platelets: 289 10*3/uL (ref 150–400)
RBC: 3.61 MIL/uL — ABNORMAL LOW (ref 4.22–5.81)
RBC: 3.9 MIL/uL — ABNORMAL LOW (ref 4.22–5.81)
RBC: 3.96 MIL/uL — ABNORMAL LOW (ref 4.22–5.81)
RDW: 18.3 % — ABNORMAL HIGH (ref 11.5–15.5)
RDW: 18.6 % — ABNORMAL HIGH (ref 11.5–15.5)
RDW: 18.7 % — ABNORMAL HIGH (ref 11.5–15.5)
WBC: 9.8 10*3/uL (ref 4.0–10.5)
WBC: 9.4 10*3/uL (ref 4.0–10.5)
WBC: 9.5 10*3/uL (ref 4.0–10.5)
nRBC: 0 % (ref 0.0–0.2)
nRBC: 0 % (ref 0.0–0.2)
nRBC: 0 % (ref 0.0–0.2)

## 2019-09-08 LAB — BASIC METABOLIC PANEL
Anion gap: 6 (ref 5–15)
BUN: 17 mg/dL (ref 8–23)
CO2: 25 mmol/L (ref 22–32)
Calcium: 8.6 mg/dL — ABNORMAL LOW (ref 8.9–10.3)
Chloride: 113 mmol/L — ABNORMAL HIGH (ref 98–111)
Creatinine, Ser: 1.43 mg/dL — ABNORMAL HIGH (ref 0.61–1.24)
GFR calc Af Amer: 57 mL/min — ABNORMAL LOW (ref >60)
GFR calc non Af Amer: 49 mL/min — ABNORMAL LOW (ref >60)
Glucose, Bld: 79 mg/dL (ref 70–99)
Potassium: 3.6 mmol/L (ref 3.5–5.1)
Sodium: 144 mmol/L (ref 135–145)

## 2019-09-08 LAB — TYPE AND SCREEN
ABO/RH(D): B NEG
Antibody Screen: NEGATIVE
Unit division: 0
Unit division: 0

## 2019-09-08 LAB — BPAM RBC
Blood Product Expiration Date: 202108282359
Blood Product Expiration Date: 202109022359
ISSUE DATE / TIME: 202108110836
ISSUE DATE / TIME: 202108111118
Unit Type and Rh: 1700
Unit Type and Rh: 1700

## 2019-09-08 LAB — GASTROINTESTINAL PANEL BY PCR, STOOL (REPLACES STOOL CULTURE)

## 2019-09-08 MED ORDER — PANTOPRAZOLE SODIUM 40 MG PO TBEC
40.0000 mg | DELAYED_RELEASE_TABLET | Freq: Every day | ORAL | Status: DC
  Administered 2019-09-08: 40 mg via ORAL
  Filled 2019-09-08: qty 1

## 2019-09-08 MED ORDER — SODIUM CHLORIDE 0.9 % IV SOLN: INTRAVENOUS | Status: AC

## 2019-09-08 MED ORDER — BOOST / RESOURCE BREEZE PO LIQD CUSTOM
1.0000 | Freq: Three times a day (TID) | ORAL | Status: DC
  Administered 2019-09-08 (×2): 1 via ORAL

## 2019-09-08 NOTE — Progress Notes (Signed)
Initial Nutrition Assessment  DOCUMENTATION CODES:   Obesity unspecified  INTERVENTION:   Boost Breeze po TID, each supplement provides 250 kcal and 9 grams of protein  MVI daily with diet advancement    Ensure Enlive po BID with diet advancement, each supplement provides 350 kcal and 20 grams of protein  Recommend B12 and iron supplementation   Pt at refeed risk; recommend monitor K, Mg and P labs daily until stable  NUTRITION DIAGNOSIS:   Inadequate oral intake related to acute illness as evidenced by per patient/family report.  GOAL:   Patient will meet greater than or equal to 90% of their needs  MONITOR:   PO intake, Supplement acceptance, Labs, Weight trends, Diet advancement, Skin, I & O's  REASON FOR ASSESSMENT:   Malnutrition Screening Tool    ASSESSMENT:   69 y.o. male with medical history significant of stroke with mild right-sided weakness on Plavix, hypertension, hyperlipidemia, diet-controlled diabetes, upper GI bleeding, BPH, CKD stage III, who presents with rectal incontinence and bloody diarrhea.   Met with pt in room today. Pt reports decreased appetite and oral intake for several days pta. Pt reports eating 100% of his clear liquid tray this morning. Pt reports significant weight loss pta. Per chart, pt is down 24lbs(10%) over the past 6 months; this is significant. RD will add supplements to help pt meet his estimated needs. RD will add strawberry Ensure and MVI with diet advancement. Suspect pt is at refeed risk. Of note, pt with B12 and iron deficiencies; recommend supplementation once medically appropriate.   Medications reviewed and include: protonix  Labs reviewed: creat 1.43(H) Iron 12(L), TIBC 393, ferritin 6(L), folate 11.7, B12 148(L) Hgb 8.6(L), Hct 28.8(L), MCV 73.8(L), MCH 22.1(L), MCHC 29.9(L)  NUTRITION - FOCUSED PHYSICAL EXAM:    Most Recent Value  Orbital Region No depletion  Upper Arm Region Mild depletion  Thoracic and Lumbar  Region No depletion  Buccal Region No depletion  Temple Region Mild depletion  Clavicle Bone Region No depletion  Clavicle and Acromion Bone Region No depletion  Scapular Bone Region No depletion  Dorsal Hand No depletion  Patellar Region Mild depletion  Anterior Thigh Region Mild depletion  Posterior Calf Region Mild depletion  Edema (RD Assessment) None  Hair Reviewed  Eyes Reviewed  Mouth Reviewed  Skin Reviewed  Nails Reviewed     Diet Order:   Diet Order            Diet clear liquid Room service appropriate? Yes; Fluid consistency: Thin  Diet effective now                EDUCATION NEEDS:   Education needs have been addressed  Skin:  Skin Assessment: Reviewed RN Assessment  Last BM:  8/12- TYPE 7  Height:   Ht Readings from Last 1 Encounters:  09/06/19 6' (1.829 m)    Weight:   Wt Readings from Last 1 Encounters:  09/08/19 102.4 kg    Ideal Body Weight:  80.9 kg  BMI:  Body mass index is 30.62 kg/m.  Estimated Nutritional Needs:   Kcal:  2300-2600kcal/day  Protein:  115-130g/day  Fluid:  >2L/day  Koleen Distance MS, RD, LDN Please refer to [facility] for RD and/or RD on-call/weekend/after hours pager

## 2019-09-08 NOTE — Progress Notes (Signed)
[facility] Inpatient Progress Note  Subjective: Patient seen for follow up GI bleed. Patient denies any abdominal pain, hematochezia, diarrhea, nausea or vomiting. He would like to advance his diet. Today is day #2 completely off Plavix.   Objective: Vital signs in last 24 hours: Temp:  [97.4 F (36.3 C)-98.1 F (36.7 C)] 97.6 F (36.4 C) (08/12 0442) Pulse Rate:  [56-64] 56 (08/12 0442) Resp:  [18-20] 20 (08/12 0442) BP: (156-183)/(74-84) 156/78 (08/12 0600) SpO2:  [94 %-99 %] 94 % (08/12 0442) Weight:  [102.4 kg] 102.4 kg (08/12 1107) Blood pressure (!) 156/78, pulse (!) 56, temperature 97.6 F (36.4 C), temperature source Oral, resp. rate 20, height 6' (1.829 m), weight 102.4 kg, SpO2 94 %.    Intake/Output from previous day: 08/11 0701 - 08/12 0700 In: 1262.1 [I.V.:937.1; Blood:325] Out: 700 [Urine:700]  Intake/Output this shift: No intake/output data recorded.   General appearance: Alert, NAD. In good spirits. Resp:  CTA no wheezes Cardio: RRR, no gallop GI:  Soft, benign, nt, nd. NO masses. BS+ Extremities:  NO edema.  Lab Results: Results for orders placed or performed during the hospital encounter of 09/07/19 (from the past 24 hour(s))  CBC     Status: Abnormal   Collection Time: 09/07/19  2:30 PM  Result Value Ref Range   WBC 9.4 4.0 - 10.5 K/uL   RBC 4.05 (L) 4.22 - 5.81 MIL/uL   Hemoglobin 9.1 (L) 13.0 - 17.0 g/dL   HCT 30.8 (L) 39 - 52 %   MCV 76.0 (L) 80.0 - 100.0 fL   MCH 22.5 (L) 26.0 - 34.0 pg   MCHC 29.5 (L) 30.0 - 36.0 g/dL   RDW 18.7 (H) 11.5 - 15.5 %   Platelets 304 150 - 400 K/uL   nRBC 0.0 0.0 - 0.2 %  HIV Antibody (routine testing w rflx)     Status: None   Collection Time: 09/07/19  2:30 PM  Result Value Ref Range   HIV Screen 4th Generation wRfx Non Reactive Non Reactive  Protime-INR     Status: None   Collection Time: 09/07/19  2:30 PM  Result Value Ref Range   Prothrombin Time 13.0 11.4 - 15.2 seconds   INR 1.0  0.8 - 1.2  APTT     Status: None   Collection Time: 09/07/19  2:30 PM  Result Value Ref Range   aPTT 30 24 - 36 seconds  CBC     Status: Abnormal   Collection Time: 09/07/19  7:03 PM  Result Value Ref Range   WBC 10.3 4.0 - 10.5 K/uL   RBC 3.79 (L) 4.22 - 5.81 MIL/uL   Hemoglobin 8.5 (L) 13.0 - 17.0 g/dL   HCT 28.0 (L) 39 - 52 %   MCV 73.9 (L) 80.0 - 100.0 fL   MCH 22.4 (L) 26.0 - 34.0 pg   MCHC 30.4 30.0 - 36.0 g/dL   RDW 18.4 (H) 11.5 - 15.5 %   Platelets 292 150 - 400 K/uL   nRBC 0.0 0.0 - 0.2 %  CBC     Status: Abnormal   Collection Time: 09/08/19  1:27 AM  Result Value Ref Range   WBC 9.8 4.0 - 10.5 K/uL   RBC 3.61 (L) 4.22 - 5.81 MIL/uL   Hemoglobin 8.1 (L) 13.0 - 17.0 g/dL   HCT 27.0 (L) 39 - 52 %   MCV 74.8 (L) 80.0 - 100.0 fL   MCH 22.4 (L) 26.0 - 34.0 pg   MCHC  30.0 30.0 - 36.0 g/dL   RDW 18.3 (H) 11.5 - 15.5 %   Platelets 284 150 - 400 K/uL   nRBC 0.0 0.0 - 0.2 %  Basic metabolic panel     Status: Abnormal   Collection Time: 09/08/19  1:27 AM  Result Value Ref Range   Sodium 144 135 - 145 mmol/L   Potassium 3.6 3.5 - 5.1 mmol/L   Chloride 113 (H) 98 - 111 mmol/L   CO2 25 22 - 32 mmol/L   Glucose, Bld 79 70 - 99 mg/dL   BUN 17 8 - 23 mg/dL   Creatinine, Ser 1.43 (H) 0.61 - 1.24 mg/dL   Calcium 8.6 (L) 8.9 - 10.3 mg/dL   GFR calc non Af Amer 49 (L) >60 mL/min   GFR calc Af Amer 57 (L) >60 mL/min   Anion gap 6 5 - 15  CBC     Status: Abnormal   Collection Time: 09/08/19  7:28 AM  Result Value Ref Range   WBC 9.4 4.0 - 10.5 K/uL   RBC 3.90 (L) 4.22 - 5.81 MIL/uL   Hemoglobin 8.6 (L) 13.0 - 17.0 g/dL   HCT 28.8 (L) 39 - 52 %   MCV 73.8 (L) 80.0 - 100.0 fL   MCH 22.1 (L) 26.0 - 34.0 pg   MCHC 29.9 (L) 30.0 - 36.0 g/dL   RDW 18.6 (H) 11.5 - 15.5 %   Platelets 298 150 - 400 K/uL   nRBC 0.0 0.0 - 0.2 %  BLOOD TRANSFUSION REPORT - SCANNED     Status: None   Collection Time: 09/08/19 11:04 AM   Narrative   Ordered by an unspecified provider.     Recent  Labs    09/07/19 1903 09/08/19 0127 09/08/19 0728  WBC 10.3 9.8 9.4  HGB 8.5* 8.1* 8.6*  HCT 28.0* 27.0* 28.8*  PLT 292 284 298   BMET Recent Labs    09/06/19 1935 09/08/19 0127  NA 142 144  K 4.0 3.6  CL 109 113*  CO2 26 25  GLUCOSE 99 79  BUN 25* 17  CREATININE 2.35* 1.43*  CALCIUM 8.5* 8.6*   LFT Recent Labs    09/06/19 1935  PROT 6.6  ALBUMIN 3.2*  AST 16  ALT 10  ALKPHOS 65  BILITOT 0.8   PT/INR Recent Labs    09/07/19 1430  LABPROT 13.0  INR 1.0   Hepatitis Panel No results for input(s): HEPBSAG, HCVAB, HEPAIGM, HEPBIGM in the last 72 hours. C-Diff No results for input(s): CDIFFTOX in the last 72 hours. No results for input(s): CDIFFPCR in the last 72 hours.   Studies/Results: No results found.  Scheduled Inpatient Medications:   . feeding supplement  1 Container Oral TID BM  . pantoprazole  40 mg Oral Daily    Continuous Inpatient Infusions:   . sodium chloride 75 mL/hr at 09/08/19 1145    PRN Inpatient Medications:  acetaminophen, hydrALAZINE, ondansetron (ZOFRAN) IV   Assessment:  1. GI blood loss anemia - s/p 2 u prbc transfusion. Patient responding overall well with increase in Hgb 2g (6.7 --->8.6). NO recurrent bleeding. Colonoscopy naive. 2. Melena - NO other localizing symptoms such as abdominal pain, nausea, vomiting.  Plan:  1. Continue holding Plavix. 2. I advocate for EGD and colonoscopy as INPATIENT when feasible. Ideally electively in 2-3 days. 3. Will advance diet at this time to full liquids.  Morgyn Marut K. Alice Reichert, M.D. 09/08/2019, 12:23 PM

## 2019-09-08 NOTE — Progress Notes (Signed)
[facility] [facility] [place] NAME: Fuad Forget    MR#:  [id number]  DATE OF BIRTH:  09-15-1950  SUBJECTIVE:   Patient had couple dark colored stools in the nighttime. None during the daytime so far. Feeling hungry. No abdominal pain REVIEW OF SYSTEMS:   Review of Systems  Constitutional: Negative for chills, fever and weight loss.  HENT: Negative for ear discharge, ear pain and nosebleeds.   Eyes: Negative for blurred vision, pain and discharge.  Respiratory: Negative for sputum production, shortness of breath, wheezing and stridor.   Cardiovascular: Negative for chest pain, palpitations, orthopnea and PND.  Gastrointestinal: Positive for blood in stool. Negative for abdominal pain, diarrhea, nausea and vomiting.  Genitourinary: Negative for frequency and urgency.  Musculoskeletal: Negative for back pain and joint pain.  Neurological: Positive for weakness. Negative for sensory change, speech change and focal weakness.  Psychiatric/Behavioral: Negative for depression and hallucinations. The patient is not nervous/anxious.    Tolerating Diet:yes--FLD Tolerating PT: pending  DRUG ALLERGIES:  No Known Allergies  VITALS:  Blood pressure (!) 147/83, pulse 60, temperature 98.2 F (36.8 C), temperature source Oral, resp. rate 20, height 6' (1.829 m), weight 102.4 kg, SpO2 99 %.  PHYSICAL EXAMINATION:   Physical Exam  GENERAL:  69 y.o.-year-old patient lying in the bed with no acute distress. Pallor+ EYES: Pupils equal, round, reactive to light and accommodation. No scleral icterus.   HEENT: Head atraumatic, normocephalic. Oropharynx and nasopharynx clear.  NECK:  Supple, no jugular venous distention. No thyroid enlargement, no tenderness.  LUNGS: Normal breath sounds bilaterally, no wheezing, rales, rhonchi. No use of accessory muscles of respiration.  CARDIOVASCULAR: S1, S2 normal. No murmurs, rubs, or gallops.  ABDOMEN: Soft, nontender,  nondistended. Bowel sounds present. No organomegaly or mass.  EXTREMITIES: No cyanosis, clubbing or edema b/l.    NEUROLOGIC: Cranial nerves II through XII are intact. No focal Motor or sensory deficits b/l.   PSYCHIATRIC:  patient is alert and oriented x 3.  SKIN: No obvious rash, lesion, or ulcer.   LABORATORY PANEL:  CBC Recent Labs  Lab 09/08/19 0728  WBC 9.4  HGB 8.6*  HCT 28.8*  PLT 298    Chemistries  Recent Labs  Lab 09/06/19 1935 09/06/19 1935 09/08/19 0127  NA 142   < > 144  K 4.0   < > 3.6  CL 109   < > 113*  CO2 26   < > 25  GLUCOSE 99   < > 79  BUN 25*   < > 17  CREATININE 2.35*   < > 1.43*  CALCIUM 8.5*   < > 8.6*  AST 16  --   --   ALT 10  --   --   ALKPHOS 65  --   --   BILITOT 0.8  --   --    < > = values in this interval not displayed.   Cardiac Enzymes No results for input(s): TROPONINI in the last 168 hours. RADIOLOGY:  No results found. ASSESSMENT AND PLAN:  Lynell Kussman is a 69 y.o. male with medical history significant of stroke with mild right-sided weakness on Plavix, hypertension, hyperlipidemia, diet-controlled diabetes, upper GI bleeding, BPH, CKD stage III, who presents with rectal incontinence and bloody diarrhea.   GIB (gastrointestinal bleeding), microcytic anemia and anemia due to blood loss.  Patient has bloody diarrhea.  Also has some lower abdominal tenderness. hgb 11.9 -->6.7--2 units--8.6 - Dr. Alice Reichert of GI  is consulted - Zofran IV for nausea - Avoid NSAIDs and SQ heparin --Anemia panel --Hold Plavix (patient is not taking aspirin currently) -- Dr. Alice Reichert plans to do EGD colonoscopy in 2 to 3 days  HTN (hypertension) -IV hydralazine as needed -Amlodipine  HLD (hyperlipidemia):  -Patient not taking medications -Follow-up with PCP  History of CVA (cerebral vascular accident) ([place]) -Hold Plavix  BPH (benign prostatic hyperplasia) -Flomax and Proscar  Acute renal failure superimposed on stage 3a chronic kidney  disease ([place]): Baseline creatinine 1.5-1.6, his creatinine is at 2.35--down to 1.43, BUN 25.  Likely due to dehydration secondary to diarrhea. -Avoid using renal toxic medications -IV fluid as above -Follow-up by BMP     DVT ppx: SCD Code Status: Full code Family Communication:  Yes, patient's nephew by phone Disposition Plan:  Anticipate discharge back to previous home environment Consults called: Dr. Alice Reichert of GI Admission status: Med-surg bed as inpt     Status is: Inpatient  Remains inpatient appropriate because:Hemodynamically unstable.  Patient has multiple comorbidities, now presents with GI bleeding, hemoglobin dropped from 11.9 to 6.7.  Patient will need endoscopy.  His presentation is highly complicated.  Patient will need to be treated in the hospital for at least 2-3 days patient needs G.I. evaluation done in-house   Dispo: The patient is from: Home  Anticipated d/c is to: Home  Anticipated d/c date is: 2-3 days  Patient currently is not medically stable to d/c.     TOTAL TIME TAKING CARE OF THIS PATIENT: *25* minutes.  >50% time spent on counselling and coordination of care  Note: This dictation was prepared with Dragon dictation along with smaller phrase technology. Any transcriptional errors that result from this process are unintentional.  Fritzi Mandes M.D    [facility]   CC: Primary care physician; Donnie Coffin, MDPatient ID: Jann Ra, male   DOB: 1950-12-25, 69 y.o.   MRN: [id number]

## 2019-09-09 LAB — CBC
HCT: 28.3 % — ABNORMAL LOW (ref 39.0–52.0)
Hemoglobin: 9 g/dL — ABNORMAL LOW (ref 13.0–17.0)
MCH: 23.1 pg — ABNORMAL LOW (ref 26.0–34.0)
MCHC: 31.8 g/dL (ref 30.0–36.0)
MCV: 72.6 fL — ABNORMAL LOW (ref 80.0–100.0)
Platelets: 273 10*3/uL (ref 150–400)
RBC: 3.9 MIL/uL — ABNORMAL LOW (ref 4.22–5.81)
RDW: 19 % — ABNORMAL HIGH (ref 11.5–15.5)
WBC: 9.9 10*3/uL (ref 4.0–10.5)
nRBC: 0 % (ref 0.0–0.2)

## 2019-09-09 MED ORDER — TAMSULOSIN HCL 0.4 MG PO CAPS
0.4000 mg | ORAL_CAPSULE | Freq: Every day | ORAL | Status: DC
  Administered 2019-09-09 – 2019-09-16 (×6): 0.4 mg via ORAL
  Filled 2019-09-09 (×6): qty 1

## 2019-09-09 MED ORDER — VITAMIN B-12 1000 MCG PO TABS
1000.0000 ug | ORAL_TABLET | Freq: Every day | ORAL | Status: DC
  Administered 2019-09-09 – 2019-09-16 (×6): 1000 ug via ORAL
  Filled 2019-09-09 (×6): qty 1

## 2019-09-09 MED ORDER — FINASTERIDE 5 MG PO TABS
5.0000 mg | ORAL_TABLET | Freq: Every day | ORAL | Status: DC
  Administered 2019-09-09 – 2019-09-16 (×6): 5 mg via ORAL
  Filled 2019-09-09 (×6): qty 1

## 2019-09-09 MED ORDER — ENSURE ENLIVE PO LIQD
237.0000 mL | Freq: Three times a day (TID) | ORAL | Status: DC
  Administered 2019-09-09 (×2): 237 mL via ORAL

## 2019-09-09 MED ORDER — AMLODIPINE BESYLATE 10 MG PO TABS
10.0000 mg | ORAL_TABLET | Freq: Every day | ORAL | Status: DC
  Administered 2019-09-09 – 2019-09-16 (×6): 10 mg via ORAL
  Filled 2019-09-09 (×6): qty 1

## 2019-09-09 MED ORDER — ADULT MULTIVITAMIN W/MINERALS CH
1.0000 | ORAL_TABLET | Freq: Every day | ORAL | Status: DC
  Administered 2019-09-09 – 2019-09-16 (×5): 1 via ORAL
  Filled 2019-09-09 (×6): qty 1

## 2019-09-09 MED ORDER — PANTOPRAZOLE SODIUM 40 MG PO TBEC
40.0000 mg | DELAYED_RELEASE_TABLET | Freq: Two times a day (BID) | ORAL | Status: DC
  Administered 2019-09-09 – 2019-09-16 (×9): 40 mg via ORAL
  Filled 2019-09-09 (×10): qty 1

## 2019-09-09 NOTE — Progress Notes (Signed)
GI Inpatient Follow-up Note  Subjective:  Patient seen in follow-up for GI bleed. No acute events overnight. He denies any abdominal pain, nausea, vomiting, or fevers. Per nursing report, he has had four BMs today with red tint with brown runny stool. He is tolerating full liquid diet without difficulty. He offers no new complaints.   Scheduled Inpatient Medications:  . amLODipine  10 mg Oral Daily  . feeding supplement (ENSURE ENLIVE)  237 mL Oral TID BM  . finasteride  5 mg Oral Daily  . multivitamin with minerals  1 tablet Oral Daily  . pantoprazole  40 mg Oral BID AC  . tamsulosin  0.4 mg Oral Daily  . vitamin B-12  1,000 mcg Oral Daily    Continuous Inpatient Infusions:    PRN Inpatient Medications:  acetaminophen, ondansetron (ZOFRAN) IV  Review of Systems:  See HPI   Physical Examination: BP (!) 165/88 (BP Location: Right Arm)   Pulse (!) 55   Temp 98.2 F (36.8 C) (Oral)   Resp 20   Ht 6' (1.829 m)   Wt 102.4 kg   SpO2 97%   BMI 30.62 kg/m   General appearance: Alert, NAD. In good spirits. Answers all questions appropriately.  Resp:  CTA no wheezes Cardio: RRR, no gallop GI:  Soft, benign, nt, nd. NO masses. BS+ Extremities:  No edema.   Data: Lab Results  Component Value Date   WBC 9.9 09/09/2019   HGB 9.0 (L) 09/09/2019   HCT 28.3 (L) 09/09/2019   MCV 72.6 (L) 09/09/2019   PLT 273 09/09/2019   Recent Labs  Lab 09/08/19 0728 09/08/19 1855 09/09/19 0654  HGB 8.6* 9.1* 9.0*   Lab Results  Component Value Date   NA 144 09/08/2019   K 3.6 09/08/2019   CL 113 (H) 09/08/2019   CO2 25 09/08/2019   BUN 17 09/08/2019   CREATININE 1.43 (H) 09/08/2019   Lab Results  Component Value Date   ALT 10 09/06/2019   AST 16 09/06/2019   ALKPHOS 65 09/06/2019   BILITOT 0.8 09/06/2019   Recent Labs  Lab 09/07/19 1430  APTT 30  INR 1.0   Assessment/Plan:  69 y/o AA male with a PMH of Hx of CVA on Plavix and ASA, HTN, HLD, CKD Stage III, BPH, and  diet-controlled diabetes mellitus presented to the ED for chief complaint of fecal incontinence   1. Lower GI Bleeding - DDx includes diverticular bleed, AVMs, polyp, malignancy, IBD, anal outlet etiology, small bowel source, or less likely UGI bleed  2. Acute blood loss anemia/IDA - on admission 6.7 from baseline 11.9 09/2018. He is s/p 2 units pRBCs with post-transfusion Hgb 7.6. Hemoglobin currently 9.0.   3. Fecal incontinence - DDx includes chronic constipation with overflow, decreased sphincter control, malignancy, pelvic floor dysfunction, nerve injury, etc  4. Chronic antiplatelet therapy with Plavix - Holding  Recommendations:  -Continue to monitor H&H closely and transfuse as needed to maintain Hgb >7.0 -Hemodynamically stable currently with no overt gastrointestinal bleeding -Plan for EGD and colonoscopy over the weekend for endoluminal evaluation with Dr. Allen Norris -Will defer scheduling to Dr. Allen Norris (Saturday or Sunday morning) -Dr. Allen Norris will take over patient's care this weekend  -I called and spoke with patient's nephew - Reubin Bushnell who was in agreement with above plan of care   I reviewed the risks (including bleeding, perforation, infection, anesthesia complications, cardiac/respiratory complications), benefits and alternatives of colonoscopy. Patient and uncle consent to proceed.    Please call  with questions or concerns.  Discussed case with Dr. Alice Reichert who was in agreement with above plan of care.   Octavia Bruckner, [facility] [phone] (Cell.[facility]

## 2019-09-09 NOTE — Progress Notes (Signed)
[facility] [place] NAME: Gifford Ballon    MR#:  [id number]  DATE OF BIRTH:  March 28, 1950  SUBJECTIVE:   Per RN no dark colored stools No abdominal pain REVIEW OF SYSTEMS:   Review of Systems  Constitutional: Negative for chills, fever and weight loss.  HENT: Negative for ear discharge, ear pain and nosebleeds.   Eyes: Negative for blurred vision, pain and discharge.  Respiratory: Negative for sputum production, shortness of breath, wheezing and stridor.   Cardiovascular: Negative for chest pain, palpitations, orthopnea and PND.  Gastrointestinal: Positive for blood in stool. Negative for abdominal pain, diarrhea, nausea and vomiting.  Genitourinary: Negative for frequency and urgency.  Musculoskeletal: Negative for back pain and joint pain.  Neurological: Positive for weakness. Negative for sensory change, speech change and focal weakness.  Psychiatric/Behavioral: Negative for depression and hallucinations. The patient is not nervous/anxious.    Tolerating Diet:yes--FLD  DRUG ALLERGIES:  No Known Allergies  VITALS:  Blood pressure (!) 165/88, pulse (!) 55, temperature 98.2 F (36.8 C), temperature source Oral, resp. rate 20, height 6' (1.829 m), weight 102.4 kg, SpO2 97 %.  PHYSICAL EXAMINATION:   Physical Exam  GENERAL:  69 y.o.-year-old patient lying in the bed with no acute distress. Pallor+ EYES: Pupils equal, round, reactive to light and accommodation. No scleral icterus.   HEENT: Head atraumatic, normocephalic. Oropharynx and nasopharynx clear.  NECK:  Supple, no jugular venous distention. No thyroid enlargement, no tenderness.  LUNGS: Normal breath sounds bilaterally, no wheezing, rales, rhonchi. No use of accessory muscles of respiration.  CARDIOVASCULAR: S1, S2 normal. No murmurs, rubs, or gallops.  ABDOMEN: Soft, nontender, nondistended. Bowel sounds present. No organomegaly or mass.  EXTREMITIES: No cyanosis, clubbing or  edema b/l.    NEUROLOGIC: Cranial nerves II through XII are intact. No focal Motor or sensory deficits b/l.   PSYCHIATRIC:  patient is alert and oriented x 3.  SKIN: No obvious rash, lesion, or ulcer.   LABORATORY PANEL:  CBC Recent Labs  Lab 09/09/19 0654  WBC 9.9  HGB 9.0*  HCT 28.3*  PLT 273    Chemistries  Recent Labs  Lab 09/06/19 1935 09/06/19 1935 09/08/19 0127  NA 142   < > 144  K 4.0   < > 3.6  CL 109   < > 113*  CO2 26   < > 25  GLUCOSE 99   < > 79  BUN 25*   < > 17  CREATININE 2.35*   < > 1.43*  CALCIUM 8.5*   < > 8.6*  AST 16  --   --   ALT 10  --   --   ALKPHOS 65  --   --   BILITOT 0.8  --   --    < > = values in this interval not displayed.   Cardiac Enzymes No results for input(s): TROPONINI in the last 168 hours. RADIOLOGY:  No results found. ASSESSMENT AND PLAN:  Loudon Krakow is a 68 y.o. male with medical history significant of stroke with mild right-sided weakness on Plavix, hypertension, hyperlipidemia, diet-controlled diabetes, upper GI bleeding, BPH, CKD stage III, who presents with rectal incontinence and bloody diarrhea.   GIB (gastrointestinal bleeding), microcytic anemia and anemia due to blood loss.  Patient has bloody diarrhea.  Also has some lower abdominal tenderness. hgb 11.9 -->6.7--2 units PRBC--8.6 - Dr. Alice Reichert of GI is consulted--Dr Allen Norris to do colonoscopy over the weekend - Zofran IV  for nausea - Avoid NSAIDs and SQ heparin --Anemia panel --Hold Plavix   HTN (hypertension) -IV hydralazine as needed -Amlodipine  HLD (hyperlipidemia):  -Patient not taking medications -Follow-up with PCP  History of CVA (cerebral vascular accident) ([place]) -Hold Plavix  BPH (benign prostatic hyperplasia) -Flomax and Proscar  Acute renal failure superimposed on stage 3a chronic kidney disease ([place]): Baseline creatinine 1.5-1.6, his creatinine is at 2.35--down to 1.43, BUN 25.  Likely due to dehydration secondary to diarrhea. -Avoid  using renal toxic medications -IV fluid as above      DVT ppx: SCD Code Status: Full code Family Communication:  Yes, patient's nephew by phone Disposition Plan:  Anticipate discharge back to previous home environment Consults called: Dr. Alice Reichert of GI Admission status: Med-surg bed as inpt     Status is: Inpatient  Remains inpatient appropriate because:Hemodynamically unstable.  Patient has multiple comorbidities, now presents with GI bleeding, hemoglobin dropped from 11.9 to 6.7.  Patient will need colonoscopy.   patient needs G.I. evaluation done in-house--likely to be done over the weeknd   Dispo: The patient is from: Home  Anticipated d/c is to: Home  Anticipated d/c date is: 1-2 days  Patient currently is not medically stable to d/c.   TOTAL TIME TAKING CARE OF THIS PATIENT: *20* minutes.  >50% time spent on counselling and coordination of care  Note: This dictation was prepared with Dragon dictation along with smaller phrase technology. Any transcriptional errors that result from this process are unintentional.  Fritzi Mandes M.D    [facility]   CC: Primary care physician; Donnie Coffin, MDPatient ID: Raiyan Dalesandro, male   DOB: 09/16/1950, 69 y.o.   MRN: [id number]

## 2019-09-09 NOTE — Care Management Important Message (Signed)
Important Message  Patient Details  Name: Christopher Delacruz MRN: [id number] Date of Birth: 21-Oct-1950   Medicare Important Message Given:  Yes     [name] 09/09/2019, 1:28 PM

## 2019-09-10 DIAGNOSIS — D62 Acute posthemorrhagic anemia: Secondary | ICD-10-CM

## 2019-09-10 MED ORDER — PEG 3350-KCL-NA BICARB-NACL 420 G PO SOLR
4000.0000 mL | Freq: Once | ORAL | Status: DC
  Filled 2019-09-10 (×2): qty 4000

## 2019-09-10 MED ORDER — LACTATED RINGERS IV SOLN: INTRAVENOUS | Status: DC

## 2019-09-10 MED ORDER — POLYETHYLENE GLYCOL 3350 17 GM/SCOOP PO POWD
1.0000 | Freq: Once | ORAL | Status: AC
  Administered 2019-09-10: 255 g via ORAL
  Filled 2019-09-10: qty 255

## 2019-09-10 NOTE — TOC Initial Note (Signed)
Transition of Care [facility]) - Initial/Assessment Note    Patient Details  Name: Christopher Delacruz MRN: [id number] Date of Birth: 1950-09-02  Transition of [facility]) CM/SW Contact:    Elliot Gurney [place], [place] Phone Number: [phone] 09/10/2019, 4:12 PM  Clinical Narrative:                 Patient is a 69 year old male admitted due to fecal incontinence and bloody diarrhea. Patient's nephew requested a call to discuss patient's discharge plan. Per patient's nephew, patient will be discharging home with to the care of his niece, Keldric Poyer [phone]. It was explained that although patient's niece is willing to provide care, she works during the day and would possibly need assistance with patient during her work hours. This Education officer, museum discussed the difference between [facility] and personal care services and explained that in home personal care services are generally an out of pocket expense. Applying for Medicaid through the [place] [employer] was discussed as a possibility as well as Meals on Wheels-contact phone number provided.    Social work to continue to follow to assess for discharge needs.   Expected Discharge Plan: Home/Self Care Barriers to Discharge: No Barriers Identified   Patient Goals and CMS Choice        Expected Discharge Plan and Services Expected Discharge Plan: Home/Self Care In-house Referral: Clinical Social Work     Living arrangements for the past 2 months: Single Family Home (duplex)                                      Prior Living Arrangements/Services Living arrangements for the past 2 months: Single Family Home (duplex) Lives with:: Self Patient language and need for interpreter reviewed:: No Do you feel safe going back to the place where you live?: Yes      Need for Family Participation in Patient Care: Yes (Comment) Care giver support system in place?: Yes (comment)   Criminal Activity/Legal Involvement Pertinent to Current  Situation/Hospitalization: No - Comment as needed  Activities of Daily Living Home Assistive Devices/Equipment: None ADL Screening (condition at time of admission) Patient's cognitive ability adequate to safely complete daily activities?: Yes Is the patient deaf or have difficulty hearing?: No Does the patient have difficulty seeing, even when wearing glasses/contacts?: No Does the patient have difficulty concentrating, remembering, or making decisions?: Yes Patient able to express need for assistance with ADLs?: No Does the patient have difficulty dressing or bathing?: No Independently performs ADLs?: Yes (appropriate for developmental age) Does the patient have difficulty walking or climbing stairs?: Yes Weakness of Legs: Right Weakness of Arms/Hands: None  Permission Sought/Granted   Permission granted to share information with : Yes, Verbal Permission Granted        Permission granted to share info w Relationship: Sylar Voong  Permission granted to share info w Contact Information: [phone]  Emotional Assessment         Alcohol / Substance Use: Not Applicable Psych Involvement: No (comment)  Admission diagnosis:  GIB (gastrointestinal bleeding) [K92.2] Upper GI bleed [K92.2] Gastrointestinal hemorrhage, unspecified gastrointestinal hemorrhage type [K92.2] Patient Active Problem List   Diagnosis Date Noted  . BPH (benign prostatic hyperplasia) 09/07/2019  . Acute renal failure superimposed on stage 3a chronic kidney disease ([place]) 09/07/2019  . Microcytic anemia 09/07/2019  . GIB (gastrointestinal bleeding) 09/07/2019  . Anemia due to blood loss 09/07/2019  .  CVA (cerebral vascular accident) ([place]) 03/22/2018  . Acute CVA (cerebrovascular accident) ([place]) 03/22/2018  . HLD (hyperlipidemia) 03/21/2018  . Right sided weakness 06/26/2015  . Dysphagia 06/26/2015  . Essential hypertension, malignant 06/26/2015  . Acute renal insufficiency 06/26/2015  . Hypokalemia  06/26/2015  . Cerebral infarction ([place]) 06/23/2015  . Diabetes ([place]) 06/23/2015  . HTN (hypertension) 06/23/2015   PCP:  Donnie Coffin, MD Pharmacy:   [facility] [place] [place] [address] Phone: [phone] Fax: [phone]  [place] [address] [place] - [place]) [place] [address] Phone: [phone] Fax: [phone]     Social Determinants of Health (SDOH) Interventions    Readmission Risk Interventions No flowsheet data found.

## 2019-09-10 NOTE — Progress Notes (Signed)
Chaplain On-Call responded to Order Requisition for Advance Directives information for patient.  Chaplain met the patient at his bedside. He stated "I have these". Patient also stated that he prefers to sleep now.  "Flowsheet" information in EPIC  shows that the patient declined receiving AD information on August 10. Chaplain will ask Nurses to clarify the Order Requisition after their shift change.  [place] Golden Emile M.Div., [facility]

## 2019-09-10 NOTE — Progress Notes (Signed)
Christopher Lame, MD [facility]   [address] [place], [place] [address] Phone: [phone] Fax : [phone]   Subjective: This patient was evaluated by Dr. Alice Reichert for anemia while on Plavix.  The patient has now been off Plavix and had a colonoscopy planned for the 18th of this month as an outpatient.  The patient has had some dark stools recently and had a hemoglobin of 11.9 down to 6.7 with transfusion of 2 units of blood.  There is also report of a change in stool caliber.   Objective: Vital signs in last 24 hours: Vitals:   09/09/19 1250 09/09/19 1621 09/09/19 1939 09/10/19 0514  BP: (!) 165/88 (!) 139/59 (!) 148/66 (!) 149/72  Pulse: (!) 55  64 (!) 46  Resp: 20  18 19   Temp: 98.2 F (36.8 C)  98.3 F (36.8 C) 98.6 F (37 C)  TempSrc: Oral  Oral Oral  SpO2: 97%  97% 97%  Weight:      Height:       Weight change:   Intake/Output Summary (Last 24 hours) at 09/10/2019 0805 Last data filed at 09/10/2019 0536 Gross per 24 hour  Intake 600 ml  Output 500 ml  Net 100 ml     Exam: Heart:: Regular rate and rhythm, S1S2 present or without murmur or extra heart sounds Lungs: normal and clear to auscultation and percussion Abdomen: soft, nontender, normal bowel sounds   Lab Results: @LABTEST2 @ Micro Results: Recent Results (from the past 240 hour(s))  SARS Coronavirus 2 by RT PCR (hospital order, performed in [place] hospital lab) Nasopharyngeal Nasopharyngeal Swab     Status: None   Collection Time: 09/07/19  7:22 AM   Specimen: Nasopharyngeal Swab  Result Value Ref Range Status   SARS Coronavirus 2 NEGATIVE NEGATIVE Final    Comment: (NOTE) SARS-CoV-2 target nucleic acids are NOT DETECTED.  The SARS-CoV-2 RNA is generally detectable in upper and lower respiratory specimens during the acute phase of infection. The lowest concentration of SARS-CoV-2 viral copies this assay can detect is 250 copies / mL. A negative result does not preclude SARS-CoV-2  infection and should not be used as the sole basis for treatment or other patient management decisions.  A negative result may occur with improper specimen collection / handling, submission of specimen other than nasopharyngeal swab, presence of viral mutation(s) within the areas targeted by this assay, and inadequate number of viral copies (<250 copies / mL). A negative result must be combined with clinical observations, patient history, and epidemiological information.  Fact Sheet for Patients:   StrictlyIdeas.no  Fact Sheet for Healthcare Providers: BankingDealers.co.za  This test is not yet approved or  cleared by the [place] FDA and has been authorized for detection and/or diagnosis of SARS-CoV-2 by FDA under an Emergency Use Authorization (EUA).  This EUA will remain in effect (meaning this test can be used) for the duration of the COVID-19 declaration under Section 564(b)(1) of the Act, 21 U.S.C. section 360bbb-3(b)(1), unless the authorization is terminated or revoked sooner.  Performed at [facility], [address] [place], [place] [address]   Gastrointestinal Panel by PCR , Stool     Status: None   Collection Time: 09/07/19  3:58 PM   Specimen: Stool  Result Value Ref Range Status   Campylobacter species NOT DETECTED NOT DETECTED Final   Plesimonas shigelloides NOT DETECTED NOT DETECTED Final   Salmonella species NOT DETECTED NOT DETECTED Final   Yersinia enterocolitica NOT DETECTED NOT  DETECTED Final   Vibrio species NOT DETECTED NOT DETECTED Final   Vibrio cholerae NOT DETECTED NOT DETECTED Final   Enteroaggregative E coli (EAEC) NOT DETECTED NOT DETECTED Final   Enteropathogenic E coli (EPEC) NOT DETECTED NOT DETECTED Final   Enterotoxigenic E coli (ETEC) NOT DETECTED NOT DETECTED Final   Shiga like toxin producing E coli (STEC) NOT DETECTED NOT DETECTED Final   Shigella/Enteroinvasive E coli (EIEC)  NOT DETECTED NOT DETECTED Final   Cryptosporidium NOT DETECTED NOT DETECTED Final   Cyclospora cayetanensis NOT DETECTED NOT DETECTED Final   Entamoeba histolytica NOT DETECTED NOT DETECTED Final   Giardia lamblia NOT DETECTED NOT DETECTED Final   Adenovirus F40/41 NOT DETECTED NOT DETECTED Final   Astrovirus NOT DETECTED NOT DETECTED Final   Norovirus GI/GII NOT DETECTED NOT DETECTED Final   Rotavirus A NOT DETECTED NOT DETECTED Final   Sapovirus (I, II, IV, and V) NOT DETECTED NOT DETECTED Final    Comment: Performed at [facility], [address] [place], [place] [address]   Studies/Results: No results found. Medications: I have reviewed the patient's current medications. Scheduled Meds: . amLODipine  10 mg Oral Daily  . feeding supplement (ENSURE ENLIVE)  237 mL Oral TID BM  . finasteride  5 mg Oral Daily  . multivitamin with minerals  1 tablet Oral Daily  . pantoprazole  40 mg Oral BID AC  . tamsulosin  0.4 mg Oral Daily  . vitamin B-12  1,000 mcg Oral Daily   Continuous Infusions: PRN Meds:.acetaminophen, ondansetron (ZOFRAN) IV   Assessment: Principal Problem:   GIB (gastrointestinal bleeding) Active Problems:   HTN (hypertension)   HLD (hyperlipidemia)   CVA (cerebral vascular accident) ([place])   BPH (benign prostatic hyperplasia)   Acute renal failure superimposed on stage 3a chronic kidney disease (HCC)   Microcytic anemia   Anemia due to blood loss    Plan: This patient has anemia with a history of black stools and has now been off his Plavix for 4 days.  The patient will have a work-up for his possible source of blood loss with an EGD and colonoscopy.  This will be set up for tomorrow.  The patient will have a prep today.   LOS: 3 days   Christopher Delacruz 09/10/2019, 8:05 AM Pager [phone] 7am-5pm  Check AMION for 5pm -7am coverage and on weekends

## 2019-09-10 NOTE — Progress Notes (Signed)
PROGRESS NOTE    Christopher Delacruz  XBW:[id number] DOB: 1950-06-17 DOA: 09/07/2019 PCP: Donnie Coffin, MD   Chief complaint.  Rectal bleeding. Brief Narrative: (Start on day 1 of progress note - keep it brief and live)  Christopher Delacruz a 69 y.o.malewith medical history significant ofstroke withmildright-sided weakness onPlavix, hypertension, hyperlipidemia, diet-controlled diabetes, upper GI bleeding, BPH, CKD stage III, who presents with rectal incontinenceand bloody diarrhea.  Patient received 2 units of PRBC.  Hemoglobin has been stable.  GI consult is obtained, scheduled colonoscopy on 8/15.    Assessment & Plan:   Principal Problem:   GIB (gastrointestinal bleeding) Active Problems:   HTN (hypertension)   HLD (hyperlipidemia)   CVA (cerebral vascular accident) ([place])   BPH (benign prostatic hyperplasia)   Acute renal failure superimposed on stage 3a chronic kidney disease (HCC)   Microcytic anemia   Anemia due to blood loss  #1.  Acute blood loss anemia secondary to GI bleed. Status post 2 units PRBC.  Currently patient doing well.  No additional GI bleed.  Work-up with colonoscopy and EGD scheduled for tomorrow morning.  2.  Essential hypertension. Continue Norvasc.  3.  History of CVA. Continue hold Plavix.  4.  Acute kidney injury superimposed on stage IIIa chronic kidney disease. Renal function has back to baseline. Patient currently n.p.o. pending procedure, I will start maintenance fluids.     DVT prophylaxis: SCDs Code Status: Full Family Communication:None Disposition Plan:  . Patient came from: Home            . Anticipated d/c place: Home . Barriers to d/c OR conditions which need to be met to effect a safe d/c:   Consultants:   GI  Procedures: None Antimicrobials: None  Subjective: Patient doing better today.  No additional rectal bleeding.  No nausea vomiting abdominal pain. Not short of breath or cough.  No hypoxemia. No fever or  chills  Objective: Vitals:   09/09/19 1621 09/09/19 1939 09/10/19 0514 09/10/19 1156  BP: (!) 139/59 (!) 148/66 (!) 149/72 (!) 150/76  Pulse:  64 (!) 46 60  Resp:  _0 Temp:  98.3 F (36.8 C) 98.6 F (37 C) 98.1 F (36.7 C)  TempSrc:  Oral Oral Oral  SpO2:  97% 97% 95%  Weight:      Height:        Intake/Output Summary (Last 24 hours) at 09/10/2019 1330 Last data filed at 09/10/2019 0536 Gross per 24 hour  Intake 240 ml  Output 500 ml  Net -260 ml   Filed Weights   09/06/19 1928 09/08/19 1107  Weight: 113.4 kg 102.4 kg    Examination:  General exam: Appears calm and comfortable  Respiratory system: Clear to auscultation. Respiratory effort normal. Cardiovascular system: S1 & S2 heard, RRR. No JVD, murmurs, rubs, gallops or clicks. No pedal edema. Gastrointestinal system: Abdomen is nondistended, soft and nontender. No organomegaly or masses felt. Normal bowel sounds heard. Central nervous system: Alert and oriented. No focal neurological deficits. Extremities: Symmetric  Skin: No rashes, lesions or ulcers Psychiatry: Judgement and insight appear normal. Mood & affect appropriate.     Data Reviewed: I have personally reviewed following labs and imaging studies  CBC: Recent Labs  Lab 09/06/19 1935 09/07/19 0722 09/07/19 1903 09/08/19 0127 09/08/19 0728 09/08/19 1855 09/09/19 0654  WBC 8.9   < > 10.3 9.8 9.4 9.5 9.9  NEUTROABS 5.4  --   --   --   --   --   --  HGB 6.7*   < > 8.5* 8.1* 8.6* 9.1* 9.0*  HCT 23.4*   < > 28.0* 27.0* 28.8* 28.5* 28.3*  MCV 73.6*   < > 73.9* 74.8* 73.8* 72.0* 72.6*  PLT 299   < > 292 284 298 289 273   < > = values in this interval not displayed.   Basic Metabolic Panel: Recent Labs  Lab 09/06/19 1935 09/08/19 0127  NA 142 144  K 4.0 3.6  CL 109 113*  CO2 26 25  GLUCOSE 99 79  BUN 25* 17  CREATININE 2.35* 1.43*  CALCIUM 8.5* 8.6*   GFR: Estimated Creatinine Clearance: 59.5 mL/min (A) (by C-G formula based on SCr  of 1.43 mg/dL (H)). Liver Function Tests: Recent Labs  Lab 09/06/19 1935  AST 16  ALT 10  ALKPHOS 65  BILITOT 0.8  PROT 6.6  ALBUMIN 3.2*   Recent Labs  Lab 09/06/19 1935  LIPASE 69*   No results for input(s): AMMONIA in the last 168 hours. Coagulation Profile: Recent Labs  Lab 09/07/19 1430  INR 1.0   Cardiac Enzymes: No results for input(s): CKTOTAL, CKMB, CKMBINDEX, TROPONINI in the last 168 hours. BNP (last 3 results) No results for input(s): PROBNP in the last 8760 hours. HbA1C: No results for input(s): HGBA1C in the last 72 hours. CBG: No results for input(s): GLUCAP in the last 168 hours. Lipid Profile: No results for input(s): CHOL, HDL, LDLCALC, TRIG, CHOLHDL, LDLDIRECT in the last 72 hours. Thyroid Function Tests: No results for input(s): TSH, T4TOTAL, FREET4, T3FREE, THYROIDAB in the last 72 hours. Anemia Panel: No results for input(s): VITAMINB12, FOLATE, FERRITIN, TIBC, IRON, RETICCTPCT in the last 72 hours. Sepsis Labs: No results for input(s): PROCALCITON, LATICACIDVEN in the last 168 hours.  Recent Results (from the past 240 hour(s))  SARS Coronavirus 2 by RT PCR (hospital order, performed in [facility] hospital lab) Nasopharyngeal Nasopharyngeal Swab     Status: None   Collection Time: 09/07/19  7:22 AM   Specimen: Nasopharyngeal Swab  Result Value Ref Range Status   SARS Coronavirus 2 NEGATIVE NEGATIVE Final    Comment: (NOTE) SARS-CoV-2 target nucleic acids are NOT DETECTED.  The SARS-CoV-2 RNA is generally detectable in upper and lower respiratory specimens during the acute phase of infection. The lowest concentration of SARS-CoV-2 viral copies this assay can detect is 250 copies / mL. A negative result does not preclude SARS-CoV-2 infection and should not be used as the sole basis for treatment or other patient management decisions.  A negative result may occur with improper specimen collection / handling, submission of specimen  other than nasopharyngeal swab, presence of viral mutation(s) within the areas targeted by this assay, and inadequate number of viral copies (<250 copies / mL). A negative result must be combined with clinical observations, patient history, and epidemiological information.  Fact Sheet for Patients:   StrictlyIdeas.no  Fact Sheet for Healthcare Providers: BankingDealers.co.za  This test is not yet approved or  cleared by the [place] FDA and has been authorized for detection and/or diagnosis of SARS-CoV-2 by FDA under an Emergency Use Authorization (EUA).  This EUA will remain in effect (meaning this test can be used) for the duration of the COVID-19 declaration under Section 564(b)(1) of the Act, 21 U.S.C. section 360bbb-3(b)(1), unless the authorization is terminated or revoked sooner.  Performed at [facility], [address] [place], [place] [address]   Gastrointestinal Panel by PCR , Stool     Status: None  Collection Time: 09/07/19  3:58 PM   Specimen: Stool  Result Value Ref Range Status   Campylobacter species NOT DETECTED NOT DETECTED Final   Plesimonas shigelloides NOT DETECTED NOT DETECTED Final   Salmonella species NOT DETECTED NOT DETECTED Final   Yersinia enterocolitica NOT DETECTED NOT DETECTED Final   Vibrio species NOT DETECTED NOT DETECTED Final   Vibrio cholerae NOT DETECTED NOT DETECTED Final   Enteroaggregative E coli (EAEC) NOT DETECTED NOT DETECTED Final   Enteropathogenic E coli (EPEC) NOT DETECTED NOT DETECTED Final   Enterotoxigenic E coli (ETEC) NOT DETECTED NOT DETECTED Final   Shiga like toxin producing E coli (STEC) NOT DETECTED NOT DETECTED Final   Shigella/Enteroinvasive E coli (EIEC) NOT DETECTED NOT DETECTED Final   Cryptosporidium NOT DETECTED NOT DETECTED Final   Cyclospora cayetanensis NOT DETECTED NOT DETECTED Final   Entamoeba histolytica NOT DETECTED NOT DETECTED Final    Giardia lamblia NOT DETECTED NOT DETECTED Final   Adenovirus F40/41 NOT DETECTED NOT DETECTED Final   Astrovirus NOT DETECTED NOT DETECTED Final   Norovirus GI/GII NOT DETECTED NOT DETECTED Final   Rotavirus A NOT DETECTED NOT DETECTED Final   Sapovirus (I, II, IV, and V) NOT DETECTED NOT DETECTED Final    Comment: Performed at [facility], [address], [place], [place] [address]         Radiology Studies: No results found.      Scheduled Meds: . amLODipine  10 mg Oral Daily  . feeding supplement (ENSURE ENLIVE)  237 mL Oral TID BM  . finasteride  5 mg Oral Daily  . multivitamin with minerals  1 tablet Oral Daily  . pantoprazole  40 mg Oral BID AC  . polyethylene glycol-electrolytes  4,000 mL Oral Once  . tamsulosin  0.4 mg Oral Daily  . vitamin B-12  1,000 mcg Oral Daily   Continuous Infusions: . lactated ringers       LOS: 3 days    Time spent: 27 minutes    Sharen Hones, MD [facility]   To contact the attending provider between 7A-7P or the covering provider during after hours 7P-7A, please log into the web site [email or web address] and access using universal [place] password for that web site. If you do not have the password, please call the hospital operator.  09/10/2019, 1:30 PM

## 2019-09-11 ENCOUNTER — Inpatient Hospital Stay: Payer: Medicare Other | Admitting: Anesthesiology

## 2019-09-11 ENCOUNTER — Encounter
Admission: EM | Disposition: A | Discharge status: Skilled Nursing Facility | Payer: Self-pay | Source: Home / Self Care | Attending: Internal Medicine

## 2019-09-11 ENCOUNTER — Encounter: Payer: Self-pay | Admitting: Internal Medicine

## 2019-09-11 DIAGNOSIS — D490 Neoplasm of unspecified behavior of digestive system: Secondary | ICD-10-CM

## 2019-09-11 HISTORY — PX: COLONOSCOPY: SHX5424

## 2019-09-11 HISTORY — PX: ESOPHAGOGASTRODUODENOSCOPY: SHX5428

## 2019-09-11 LAB — BASIC METABOLIC PANEL
Anion gap: 6 (ref 5–15)
BUN: 12 mg/dL (ref 8–23)
CO2: 27 mmol/L (ref 22–32)
Calcium: 8.8 mg/dL — ABNORMAL LOW (ref 8.9–10.3)
Chloride: 110 mmol/L (ref 98–111)
Creatinine, Ser: 1.36 mg/dL — ABNORMAL HIGH (ref 0.61–1.24)
GFR calc Af Amer: >60 mL/min (ref >60)
GFR calc non Af Amer: 52 mL/min — ABNORMAL LOW (ref >60)
Glucose, Bld: 86 mg/dL (ref 70–99)
Potassium: 4.2 mmol/L (ref 3.5–5.1)
Sodium: 143 mmol/L (ref 135–145)

## 2019-09-11 LAB — CBC WITH DIFFERENTIAL/PLATELET
Abs Immature Granulocytes: 0.02 10*3/uL (ref 0.00–0.07)
Basophils Absolute: 0.1 10*3/uL (ref 0.0–0.1)
Basophils Relative: 1 %
Eosinophils Absolute: 0.3 10*3/uL (ref 0.0–0.5)
Eosinophils Relative: 3 %
HCT: 30.1 % — ABNORMAL LOW (ref 39.0–52.0)
Hemoglobin: 9 g/dL — ABNORMAL LOW (ref 13.0–17.0)
Immature Granulocytes: 0 %
Lymphocytes Relative: 17 %
Lymphs Abs: 1.8 10*3/uL (ref 0.7–4.0)
MCH: 22.2 pg — ABNORMAL LOW (ref 26.0–34.0)
MCHC: 29.9 g/dL — ABNORMAL LOW (ref 30.0–36.0)
MCV: 74.3 fL — ABNORMAL LOW (ref 80.0–100.0)
Monocytes Absolute: 0.8 10*3/uL (ref 0.1–1.0)
Monocytes Relative: 8 %
Neutro Abs: 7.1 10*3/uL (ref 1.7–7.7)
Neutrophils Relative %: 71 %
Platelets: 254 10*3/uL (ref 150–400)
RBC: 4.05 MIL/uL — ABNORMAL LOW (ref 4.22–5.81)
RDW: 19.7 % — ABNORMAL HIGH (ref 11.5–15.5)
WBC: 10.1 10*3/uL (ref 4.0–10.5)
nRBC: 0 % (ref 0.0–0.2)

## 2019-09-11 LAB — MAGNESIUM: Magnesium: 1.9 mg/dL (ref 1.7–2.4)

## 2019-09-11 SURGERY — ESOPHAGOGASTRODUODENOSCOPY (EGD) WITH PROPOFOL
Anesthesia: General

## 2019-09-11 SURGERY — EGD (ESOPHAGOGASTRODUODENOSCOPY)
Anesthesia: General

## 2019-09-11 SURGERY — COLONOSCOPY WITH PROPOFOL
Anesthesia: General

## 2019-09-11 MED ORDER — POLYETHYLENE GLYCOL 3350 17 GM/SCOOP PO POWD
1.0000 | Freq: Once | ORAL | Status: AC
  Administered 2019-09-11: 255 g via ORAL
  Filled 2019-09-11: qty 255

## 2019-09-11 MED ORDER — SODIUM CHLORIDE 0.9 % IV SOLN
INTRAVENOUS | Status: DC | PRN
  Administered 2019-09-11 – 2019-09-15 (×2): 250 mL via INTRAVENOUS

## 2019-09-11 MED ORDER — PROPOFOL 10 MG/ML IV BOLUS
INTRAVENOUS | Status: DC | PRN
  Administered 2019-09-11: 50 mg via INTRAVENOUS

## 2019-09-11 MED ORDER — PROPOFOL 10 MG/ML IV BOLUS
INTRAVENOUS | Status: AC
  Filled 2019-09-11: qty 60

## 2019-09-11 MED ORDER — SODIUM CHLORIDE 0.9 % IV SOLN
300.0000 mg | Freq: Once | INTRAVENOUS | Status: AC
  Administered 2019-09-11: 300 mg via INTRAVENOUS
  Filled 2019-09-11: qty 15

## 2019-09-11 MED ORDER — SODIUM CHLORIDE 0.9 % IV SOLN: INTRAVENOUS | Status: DC

## 2019-09-11 MED ORDER — PROPOFOL 500 MG/50ML IV EMUL
INTRAVENOUS | Status: DC | PRN
  Administered 2019-09-11: 150 ug/kg/min via INTRAVENOUS

## 2019-09-11 NOTE — Transfer of Care (Signed)
Immediate Anesthesia Transfer of Care Note  Patient: Christopher Delacruz  Procedure(s) Performed: ESOPHAGOGASTRODUODENOSCOPY (EGD) (N/A ) COLONOSCOPY (N/A )  Patient Location: Endoscopy Unit  Anesthesia Type:General  Level of Consciousness: awake and alert   Airway & Oxygen Therapy: Patient Spontanous Breathing and Patient connected to nasal cannula oxygen  Post-op Assessment: Report given to RN and Post -op Vital signs reviewed and stable  Post vital signs: Reviewed and stable  Last Vitals:  Vitals Value Taken Time  BP    Temp    Pulse 89 09/11/19 1553  Resp 14 09/11/19 1553  SpO2 97 % 09/11/19 1553  Vitals shown include unvalidated device data.  Last Pain:  Vitals:   09/11/19 1450  TempSrc: Temporal  PainSc: 0-No pain         Complications: No complications documented.

## 2019-09-11 NOTE — Op Note (Signed)
[facility] Gastroenterology Patient Name: Christopher Delacruz Procedure Date: 09/11/2019 12:10 PM MRN: [id number] Account #: [id number] Date of Birth: 12-20-1950 Admit Type: Outpatient Age: 69 Room: [facility] ENDO ROOM 1 Gender: Male Note Status: Finalized Procedure:             Colonoscopy Indications:           Iron deficiency anemia secondary to chronic blood loss Providers:             [name] MD, MD Medicines:             Propofol per Anesthesia Complications:         No immediate complications. Procedure:             Pre-Anesthesia Assessment:                        - Prior to the procedure, a History and Physical was                         performed, and patient medications and allergies were                         reviewed. The patient's tolerance of previous                         anesthesia was also reviewed. The risks and benefits                         of the procedure and the sedation options and risks                         were discussed with the patient. All questions were                         answered, and informed consent was obtained. Prior                         Anticoagulants: The patient has taken no previous                         anticoagulant or antiplatelet agents. ASA Grade                         Assessment: II - A patient with mild systemic disease.                         After reviewing the risks and benefits, the patient                         was deemed in satisfactory condition to undergo the                         procedure.                        After obtaining informed consent, the colonoscope was                         passed under direct vision. Throughout the procedure,  the patient's blood pressure, pulse, and oxygen                         saturations were monitored continuously. The                         Colonoscope was introduced through the anus and                         advanced to the  the sigmoid colon. The colonoscopy was                         performed without difficulty. The patient tolerated                         the procedure well. The quality of the bowel                         preparation was unsatisfactory. Findings:      The perianal and digital rectal examinations were normal.      A partially obstructing large mass was found in the recto-sigmoid colon.       The mass was circumferential. This was biopsied with a cold forceps for       histology. Impression:            - Preparation of the colon was unsatisfactory.                        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 Biopsied.                        - The examination was suspicious for a                         malignant-appearing tumor. Recommendation:        - Return patient to hospital ward for ongoing care.                        - Perform a colonoscopy tomorrow. Procedure Code(s):     --- Professional ---                        [phone], 44, Colonoscopy, flexible; with biopsy, single                         or multiple Diagnosis Code(s):     --- Professional ---                        D50.0, Iron deficiency anemia secondary to blood loss                         (chronic)                        D49.0, Neoplasm of unspecified behavior of digestive  system CPT copyright 2019 American Medical Association. All rights reserved. The codes documented in this report are preliminary and upon coder review may  be revised to meet current compliance requirements. [name] MD, MD 09/11/2019 3:33:14 PM This report has been signed electronically. Number of Addenda: 0 Note Initiated On: 09/11/2019 12:10 PM Estimated Blood Loss:  Estimated blood loss: none.      [facility]

## 2019-09-11 NOTE — Op Note (Signed)
[facility] Gastroenterology Patient Name: Christopher Delacruz Procedure Date: 09/11/2019 12:10 PM MRN: [id number] Account #: [id number] Date of Birth: Sep 14, 1950 Admit Type: Outpatient Age: 69 Room: [facility] ENDO ROOM 1 Gender: Male Note Status: Finalized Procedure:             Upper GI endoscopy Indications:           Iron deficiency anemia secondary to chronic blood loss Providers:             [name] MD, MD Medicines:             Propofol per Anesthesia Complications:         No immediate complications. Procedure:             Pre-Anesthesia Assessment:                        - Prior to the procedure, a History and Physical was                         performed, and patient medications and allergies were                         reviewed. The patient's tolerance of previous                         anesthesia was also reviewed. The risks and benefits                         of the procedure and the sedation options and risks                         were discussed with the patient. All questions were                         answered, and informed consent was obtained. Prior                         Anticoagulants: The patient has taken no previous                         anticoagulant or antiplatelet agents. ASA Grade                         Assessment: II - A patient with mild systemic disease.                         After reviewing the risks and benefits, the patient                         was deemed in satisfactory condition to undergo the                         procedure.                        After obtaining informed consent, the endoscope was                         passed under direct vision. Throughout the procedure,  the patient's blood pressure, pulse, and oxygen                         saturations were monitored continuously. The Endoscope                         was introduced through the mouth, and advanced to the                          second part of duodenum. The upper GI endoscopy was                         accomplished without difficulty. The patient tolerated                         the procedure well. Findings:      The examined esophagus was normal.      The entire examined stomach was normal. Biopsies were taken with a cold       forceps for histology.      A few erosions without bleeding were found in the duodenal bulb. Impression:            - Normal esophagus.                        - Normal stomach. Biopsied.                        - Duodenal erosions without bleeding. Recommendation:        - Return patient to hospital ward for ongoing care.                        - Perform a colonoscopy today. Procedure Code(s):     --- Professional ---                        [phone], Esophagogastroduodenoscopy, flexible,                         transoral; with biopsy, single or multiple Diagnosis Code(s):     --- Professional ---                        D50.0, Iron deficiency anemia secondary to blood loss                         (chronic)                        K26.9, Duodenal ulcer, unspecified as acute or                         chronic, without hemorrhage or perforation CPT copyright 2019 American Medical Association. All rights reserved. The codes documented in this report are preliminary and upon coder review may  be revised to meet current compliance requirements. [name] MD, MD 09/11/2019 3:26:40 PM This report has been signed electronically. Number of Addenda: 0 Note Initiated On: 09/11/2019 12:10 PM Estimated Blood Loss:  Estimated blood loss: none.      [facility]

## 2019-09-11 NOTE — Progress Notes (Signed)
PROGRESS NOTE    Christopher Delacruz  LFY:[id number] DOB: [birth year] DOA: 09/07/2019 PCP: Donnie Coffin, MD   Chief complaint.  Rectal bleeding.  Brief Narrative:  Christopher Delacruz a 69 y.o.malewith medical history significant ofstroke withmildright-sided weakness onPlavix, hypertension, hyperlipidemia, diet-controlled diabetes, upper GI bleeding, BPH, CKD stage III, who presents with rectal incontinenceand bloody diarrhea.  Patient received 2 units of PRBC.  Hemoglobin has been stable.  GI consult is obtained, scheduled colonoscopy on 8/15.  8/15.  EGD showed some erosion in the duodenum.  Colonoscopy was a poor prep, suspect colon mass.  Repeat colonoscopy is scheduled for tomorrow.   Assessment & Plan:   Principal Problem:   GIB (gastrointestinal bleeding) Active Problems:   HTN (hypertension)   HLD (hyperlipidemia)   CVA (cerebral vascular accident) ([place])   BPH (benign prostatic hyperplasia)   Acute renal failure superimposed on stage 3a chronic kidney disease (HCC)   Microcytic anemia   Anemia due to blood loss   Intestinal neoplasm  #1.  Acute blood loss anemia secondary to GI bleed. Patient received 2 units PRBC.  Since then, hemoglobin has been stable.  No additional GI bleed.  Patient colonoscopy suspect colon mass, biopsy taken, patient will be scheduled for repeat colonoscopy due to poor prep for today.  2.  Acute kidney injury superimposed on stage IIIa chronic kidney disease. Renal function had improved.  3.  Essential hypertension. Continue some home medicines.       Subjective: Patient doing well today, no rectal bleeding or black stools today. Denies any short of breath or cough. No dysuria hematuria. No fever or chills.  Objective: Vitals:   09/11/19 0407 09/11/19 0850 09/11/19 1136 09/11/19 1450  BP: (!) 149/78 (!) 158/78 (!) 169/87 (!) 199/98  Pulse: 61 (!) 50 (!) 58 71  Resp: 20 20 20 18   Temp: 98.5 F (36.9 C) 98.6 F (37 C) (!) 97.5 F  (36.4 C) (!) 97.4 F (36.3 C)  TempSrc: Oral Oral Oral Temporal  SpO2: 96% 94% 96% 98%  Weight:    102.4 kg  Height:    6' (1.829 m)    Intake/Output Summary (Last 24 hours) at 09/11/2019 1544 Last data filed at 09/11/2019 1300 Gross per 24 hour  Intake 1917.41 ml  Output 800 ml  Net 1117.41 ml   Filed Weights   09/06/19 1928 09/08/19 1107 09/11/19 1450  Weight: 113.4 kg 102.4 kg 102.4 kg    Examination:  General exam: Appears calm and comfortable  Respiratory system: Clear to auscultation. Respiratory effort normal. Cardiovascular system: S1 & S2 heard, RRR. No JVD, murmurs, rubs, gallops or clicks. No pedal edema. Gastrointestinal system: Abdomen is nondistended, soft and nontender. No organomegaly or masses felt. Normal bowel sounds heard. Central nervous system: Alert and oriented. No focal neurological deficits. Extremities: Symmetric  Skin: No rashes, lesions or ulcers Psychiatry: Judgement and insight appear normal. Mood & affect appropriate.     Data Reviewed: I have personally reviewed following labs and imaging studies  CBC: Recent Labs  Lab 09/06/19 1935 09/07/19 0722 09/08/19 0127 09/08/19 0728 09/08/19 1855 09/09/19 0654 09/11/19 0936  WBC 8.9   < > 9.8 9.4 9.5 9.9 10.1  NEUTROABS 5.4  --   --   --   --   --  7.1  HGB 6.7*   < > 8.1* 8.6* 9.1* 9.0* 9.0*  HCT 23.4*   < > 27.0* 28.8* 28.5* 28.3* 30.1*  MCV 73.6*   < > 74.8* 73.8* 72.0* 72.6*  74.3*  PLT 299   < > 284 298 289 273 254   < > = values in this interval not displayed.   Basic Metabolic Panel: Recent Labs  Lab 09/06/19 1935 09/08/19 0127 09/11/19 0936  NA 142 144 143  K 4.0 3.6 4.2  CL 109 113* 110  CO2 26 25 27   GLUCOSE 99 79 86  BUN 25* 17 12  CREATININE 2.35* 1.43* 1.36*  CALCIUM 8.5* 8.6* 8.8*  MG  --   --  1.9   GFR: Estimated Creatinine Clearance: 62.6 mL/min (A) (by C-G formula based on SCr of 1.36 mg/dL (H)). Liver Function Tests: Recent Labs  Lab 09/06/19 1935  AST  16  ALT 10  ALKPHOS 65  BILITOT 0.8  PROT 6.6  ALBUMIN 3.2*   Recent Labs  Lab 09/06/19 1935  LIPASE 69*   No results for input(s): AMMONIA in the last 168 hours. Coagulation Profile: Recent Labs  Lab 09/07/19 1430  INR 1.0   Cardiac Enzymes: No results for input(s): CKTOTAL, CKMB, CKMBINDEX, TROPONINI in the last 168 hours. BNP (last 3 results) No results for input(s): PROBNP in the last 8760 hours. HbA1C: No results for input(s): HGBA1C in the last 72 hours. CBG: No results for input(s): GLUCAP in the last 168 hours. Lipid Profile: No results for input(s): CHOL, HDL, LDLCALC, TRIG, CHOLHDL, LDLDIRECT in the last 72 hours. Thyroid Function Tests: No results for input(s): TSH, T4TOTAL, FREET4, T3FREE, THYROIDAB in the last 72 hours. Anemia Panel: No results for input(s): VITAMINB12, FOLATE, FERRITIN, TIBC, IRON, RETICCTPCT in the last 72 hours. Sepsis Labs: No results for input(s): PROCALCITON, LATICACIDVEN in the last 168 hours.  Recent Results (from the past 240 hour(s))  SARS Coronavirus 2 by RT PCR (hospital order, performed in [facility] hospital lab) Nasopharyngeal Nasopharyngeal Swab     Status: None   Collection Time: 09/07/19  7:22 AM   Specimen: Nasopharyngeal Swab  Result Value Ref Range Status   SARS Coronavirus 2 NEGATIVE NEGATIVE Final    Comment: (NOTE) SARS-CoV-2 target nucleic acids are NOT DETECTED.  The SARS-CoV-2 RNA is generally detectable in upper and lower respiratory specimens during the acute phase of infection. The lowest concentration of SARS-CoV-2 viral copies this assay can detect is 250 copies / mL. A negative result does not preclude SARS-CoV-2 infection and should not be used as the sole basis for treatment or other patient management decisions.  A negative result may occur with improper specimen collection / handling, submission of specimen other than nasopharyngeal swab, presence of viral mutation(s) within the areas targeted by  this assay, and inadequate number of viral copies (<250 copies / mL). A negative result must be combined with clinical observations, patient history, and epidemiological information.  Fact Sheet for Patients:   StrictlyIdeas.no  Fact Sheet for Healthcare Providers: BankingDealers.co.za  This test is not yet approved or  cleared by the [place] FDA and has been authorized for detection and/or diagnosis of SARS-CoV-2 by FDA under an Emergency Use Authorization (EUA).  This EUA will remain in effect (meaning this test can be used) for the duration of the COVID-19 declaration under Section 564(b)(1) of the Act, 21 U.S.C. section 360bbb-3(b)(1), unless the authorization is terminated or revoked sooner.  Performed at [facility], [address] [place],  [address]   Gastrointestinal Panel by PCR , Stool     Status: None   Collection Time: 09/07/19  3:58 PM   Specimen: Stool  Result Value Ref  Range Status   Campylobacter species NOT DETECTED NOT DETECTED Final   Plesimonas shigelloides NOT DETECTED NOT DETECTED Final   Salmonella species NOT DETECTED NOT DETECTED Final   Yersinia enterocolitica NOT DETECTED NOT DETECTED Final   Vibrio species NOT DETECTED NOT DETECTED Final   Vibrio cholerae NOT DETECTED NOT DETECTED Final   Enteroaggregative E coli (EAEC) NOT DETECTED NOT DETECTED Final   Enteropathogenic E coli (EPEC) NOT DETECTED NOT DETECTED Final   Enterotoxigenic E coli (ETEC) NOT DETECTED NOT DETECTED Final   Shiga like toxin producing E coli (STEC) NOT DETECTED NOT DETECTED Final   Shigella/Enteroinvasive E coli (EIEC) NOT DETECTED NOT DETECTED Final   Cryptosporidium NOT DETECTED NOT DETECTED Final   Cyclospora cayetanensis NOT DETECTED NOT DETECTED Final   Entamoeba histolytica NOT DETECTED NOT DETECTED Final   Giardia lamblia NOT DETECTED NOT DETECTED Final   Adenovirus F40/41 NOT DETECTED NOT DETECTED  Final   Astrovirus NOT DETECTED NOT DETECTED Final   Norovirus GI/GII NOT DETECTED NOT DETECTED Final   Rotavirus A NOT DETECTED NOT DETECTED Final   Sapovirus (I, II, IV, and V) NOT DETECTED NOT DETECTED Final    Comment: Performed at [facility], [address], [place], [place] [address]         Radiology Studies: No results found.      Scheduled Meds: . [MAR Hold] amLODipine  10 mg Oral Daily  . [MAR Hold] feeding supplement (ENSURE ENLIVE)  237 mL Oral TID BM  . [MAR Hold] finasteride  5 mg Oral Daily  . [MAR Hold] multivitamin with minerals  1 tablet Oral Daily  . [MAR Hold] pantoprazole  40 mg Oral BID AC  . polyethylene glycol powder  1 Container Oral Once  . [MAR Hold] tamsulosin  0.4 mg Oral Daily  . [MAR Hold] vitamin B-12  1,000 mcg Oral Daily   Continuous Infusions: . sodium chloride 20 mL/hr at 09/11/19 1503  . [MAR Hold] sodium chloride 5 mL/hr at 09/11/19 1300  . lactated ringers 75 mL/hr at 09/11/19 1300     LOS: 4 days    Time spent: 26 minutes    Sharen Hones, MD [facility]   To contact the attending provider between 7A-7P or the covering provider during after hours 7P-7A, please log into the web site [email or web address] and access using universal [place] password for that web site. If you do not have the password, please call the hospital operator.  09/11/2019, 3:44 PM

## 2019-09-11 NOTE — Anesthesia Preprocedure Evaluation (Addendum)
Anesthesia Evaluation  Patient identified by MRN, date of birth, ID band Patient awake    Reviewed: Allergy & Precautions, H&P , NPO status , reviewed documented beta blocker date and time   Airway Mallampati: II  TM Distance: >3 FB Neck ROM: full    Dental  (+) Upper Dentures, Lower Dentures   Pulmonary    Pulmonary exam normal        Cardiovascular hypertension,  Rhythm:irregular  02/2018 ECHO IMPRESSIONS    1. The left ventricle has normal systolic function, with an ejection  fraction of 55-60%. The cavity size was normal. There is mildly increased  left ventricular wall thickness. Left ventricular diastolic Doppler  parameters are consistent with impaired  relaxation.  2. The right ventricle has normal systolic function. The cavity was  normal. There is no increase in right ventricular wall thickness.  3. The mitral valve is normal in structure. Mild thickening of the mitral  valve leaflet.  4. The tricuspid valve is normal in structure.  5. The aortic valve is normal in structure. Moderate thickening of the  aortic valve Aortic valve regurgitation is mild by color flow Doppler.   EKG Multiple premature complexes   Neuro/Psych CVA, Residual Symptoms    GI/Hepatic   Endo/Other  diabetes  Renal/GU Renal disease     Musculoskeletal   Abdominal   Peds  Hematology  (+) Blood dyscrasia, anemia ,   Anesthesia Other Findings Past Medical History: No date: CVA (cerebral vascular accident) ([place]) No date: Diabetes mellitus ([place]) No date: Hyperlipidemia No date: Hypertension  Past Surgical History: No date: EXPLORE EYE SOCKET  BMI    Body Mass Index: 30.62 kg/m      Reproductive/Obstetrics                           Anesthesia Physical Anesthesia Plan  ASA: III and emergent  Anesthesia Plan: General   Post-op Pain Management:    Induction: Intravenous  PONV Risk Score  and Plan: Treatment may vary due to age or medical condition and TIVA  Airway Management Planned: Nasal Cannula and Natural Airway  Additional Equipment:   Intra-op Plan:   Post-operative Plan:   Informed Consent: I have reviewed the patients History and Physical, chart, labs and discussed the procedure including the risks, benefits and alternatives for the proposed anesthesia with the patient or authorized representative who has indicated his/her understanding and acceptance.     Dental Advisory Given  Plan Discussed with: CRNA  Anesthesia Plan Comments:         Anesthesia Quick Evaluation

## 2019-09-12 ENCOUNTER — Encounter: Payer: Self-pay | Admitting: Gastroenterology

## 2019-09-12 ENCOUNTER — Inpatient Hospital Stay: Payer: Medicare Other | Admitting: Certified Registered"

## 2019-09-12 ENCOUNTER — Encounter
Admission: EM | Disposition: A | Discharge status: Skilled Nursing Facility | Payer: Self-pay | Source: Home / Self Care | Attending: Internal Medicine

## 2019-09-12 HISTORY — PX: COLONOSCOPY WITH PROPOFOL: SHX5780

## 2019-09-12 LAB — CBC WITH DIFFERENTIAL/PLATELET
Abs Immature Granulocytes: 0.05 10*3/uL (ref 0.00–0.07)
Basophils Absolute: 0.1 10*3/uL (ref 0.0–0.1)
Basophils Relative: 1 %
Eosinophils Absolute: 0.4 10*3/uL (ref 0.0–0.5)
Eosinophils Relative: 4 %
HCT: 29.4 % — ABNORMAL LOW (ref 39.0–52.0)
Hemoglobin: 8.9 g/dL — ABNORMAL LOW (ref 13.0–17.0)
Immature Granulocytes: 1 %
Lymphocytes Relative: 19 %
Lymphs Abs: 1.6 10*3/uL (ref 0.7–4.0)
MCH: 22.6 pg — ABNORMAL LOW (ref 26.0–34.0)
MCHC: 30.3 g/dL (ref 30.0–36.0)
MCV: 74.6 fL — ABNORMAL LOW (ref 80.0–100.0)
Monocytes Absolute: 0.8 10*3/uL (ref 0.1–1.0)
Monocytes Relative: 9 %
Neutro Abs: 5.6 10*3/uL (ref 1.7–7.7)
Neutrophils Relative %: 66 %
Platelets: 211 10*3/uL (ref 150–400)
RBC: 3.94 MIL/uL — ABNORMAL LOW (ref 4.22–5.81)
RDW: 20.1 % — ABNORMAL HIGH (ref 11.5–15.5)
WBC: 8.5 10*3/uL (ref 4.0–10.5)
nRBC: 0 % (ref 0.0–0.2)

## 2019-09-12 LAB — BASIC METABOLIC PANEL
Anion gap: 11 (ref 5–15)
BUN: 10 mg/dL (ref 8–23)
CO2: 22 mmol/L (ref 22–32)
Calcium: 8.9 mg/dL (ref 8.9–10.3)
Chloride: 107 mmol/L (ref 98–111)
Creatinine, Ser: 1.63 mg/dL — ABNORMAL HIGH (ref 0.61–1.24)
GFR calc Af Amer: 49 mL/min — ABNORMAL LOW (ref >60)
GFR calc non Af Amer: 42 mL/min — ABNORMAL LOW (ref >60)
Glucose, Bld: 88 mg/dL (ref 70–99)
Potassium: 4.4 mmol/L (ref 3.5–5.1)
Sodium: 140 mmol/L (ref 135–145)

## 2019-09-12 LAB — MAGNESIUM: Magnesium: 2.1 mg/dL (ref 1.7–2.4)

## 2019-09-12 SURGERY — COLONOSCOPY WITH PROPOFOL
Anesthesia: General

## 2019-09-12 MED ORDER — PROPOFOL 500 MG/50ML IV EMUL
INTRAVENOUS | Status: DC | PRN
  Administered 2019-09-12: 99 ug/kg/min via INTRAVENOUS

## 2019-09-12 MED ORDER — BOOST / RESOURCE BREEZE PO LIQD CUSTOM: 1.0000 | Freq: Three times a day (TID) | ORAL | Status: DC

## 2019-09-12 MED ORDER — PROPOFOL 500 MG/50ML IV EMUL
INTRAVENOUS | Status: AC
  Filled 2019-09-12: qty 50

## 2019-09-12 MED ORDER — PROPOFOL 10 MG/ML IV BOLUS
INTRAVENOUS | Status: AC
  Filled 2019-09-12: qty 20

## 2019-09-12 MED ORDER — PEG 3350-KCL-NA BICARB-NACL 420 G PO SOLR
4000.0000 mL | Freq: Once | ORAL | Status: AC
  Administered 2019-09-12: 4000 mL via ORAL
  Filled 2019-09-12 (×2): qty 4000

## 2019-09-12 NOTE — Anesthesia Preprocedure Evaluation (Signed)
Anesthesia Evaluation  Patient identified by MRN, date of birth, ID band Patient awake    Reviewed: Allergy & Precautions, H&P , NPO status , reviewed documented beta blocker date and time   Airway Mallampati: II  TM Distance: >3 FB Neck ROM: full    Dental  (+) Upper Dentures, Lower Dentures   Pulmonary    Pulmonary exam normal        Cardiovascular hypertension,  Rhythm:irregular  02/2018 ECHO IMPRESSIONS    1. The left ventricle has normal systolic function, with an ejection  fraction of 55-60%. The cavity size was normal. There is mildly increased  left ventricular wall thickness. Left ventricular diastolic Doppler  parameters are consistent with impaired  relaxation.  2. The right ventricle has normal systolic function. The cavity was  normal. There is no increase in right ventricular wall thickness.  3. The mitral valve is normal in structure. Mild thickening of the mitral  valve leaflet.  4. The tricuspid valve is normal in structure.  5. The aortic valve is normal in structure. Moderate thickening of the  aortic valve Aortic valve regurgitation is mild by color flow Doppler.   EKG Multiple premature complexes   Neuro/Psych CVA, Residual Symptoms    GI/Hepatic   Endo/Other  diabetes  Renal/GU Renal disease     Musculoskeletal   Abdominal   Peds  Hematology  (+) Blood dyscrasia, anemia ,   Anesthesia Other Findings Past Medical History: No date: CVA (cerebral vascular accident) ([place]) No date: Diabetes mellitus ([place]) No date: Hyperlipidemia No date: Hypertension  Past Surgical History: No date: EXPLORE EYE SOCKET  BMI    Body Mass Index: 30.62 kg/m      Reproductive/Obstetrics                             Anesthesia Physical  Anesthesia Plan  ASA: III and emergent  Anesthesia Plan: General   Post-op Pain Management:    Induction: Intravenous  PONV Risk  Score and Plan: Treatment may vary due to age or medical condition and TIVA  Airway Management Planned: Nasal Cannula and Natural Airway  Additional Equipment:   Intra-op Plan:   Post-operative Plan:   Informed Consent: I have reviewed the patients History and Physical, chart, labs and discussed the procedure including the risks, benefits and alternatives for the proposed anesthesia with the patient or authorized representative who has indicated his/her understanding and acceptance.     Dental Advisory Given  Plan Discussed with: CRNA  Anesthesia Plan Comments:         Anesthesia Quick Evaluation

## 2019-09-12 NOTE — Care Management Important Message (Signed)
Important Message  Patient Details  Name: Christopher Delacruz MRN: [id number] Date of Birth: 11-14-1950   Medicare Important Message Given:  Yes     [name] 09/12/2019, 11:37 AM

## 2019-09-12 NOTE — Progress Notes (Signed)
Physical Therapy Evaluation Patient Details Name: Christopher Delacruz MRN: [id number] DOB: [birth year] Today's Date: 09/12/2019   History of Present Illness  Christopher Delacruz is a 69 y.o. male with medical history significant of stroke with mild right-sided weakness on Plavix, hypertension, hyperlipidemia, diet-controlled diabetes, upper GI bleeding, BPH, CKD stage III, who presented with rectal incontinence and bloody diarrhea. Patient called his nephew and told him that he had incontinence of stool and wanted to go to hospital.  Patient has generalized weakness.  Pt had bloody diarrhea in the emergency room with dark-colored blood. Patient had mild lower abdominal pain. No nausea, vomiting.  Patient denies chest pain, shortness breath, cough, fever or chills.  No symptoms of UTI. Patient was found to have positive FOBT. He is currently admitted for GIB with anemia.   Clinical Impression  Pt admitted with above diagnosis. Pt currently with functional limitations due to the deficits listed below (see PT Problem List).  For bed mobility pt moves slowly, requires increased time. HOB elevated. He requires two attempts to stand up with CGA and requires cues for safe handplacement. Once in standing he was steady with UE support on walker. Pt is able to ambulate approximately 120'. Decreased step length with shuffling gait, decreased speed, toe drag on R, slow turns. No external support required by therapist to stabilize and no overt LOB. Patient was able to close his eyes with his feet together with mild unsteadiness, unable/unwilling to attempt single leg balance or take a step forward without UE support. Pt is safe to return home at discharge but will need intermittent supervision due to concerns regarding his cognition and balance. He states that he plans to stay with his niece at discharge which would be much safer however history is felt to be unreliable given that he is AOx2. Pt will benefit from PT services to address  deficits in strength, balance, and mobility in order to return to full function at home.      Follow Up Recommendations Home health PT;Supervision - Intermittent    Equipment Recommendations  Rolling walker with 5" wheels    Recommendations for Other Services       Precautions / Restrictions Precautions Precautions: Fall Restrictions Weight Bearing Restrictions: No      Mobility  Bed Mobility Overal bed mobility: Needs Assistance Bed Mobility: Supine to Sit;Sit to Supine     Supine to sit: Min assist Sit to supine: Min assist   General bed mobility comments: from flat bed with increased time and assist with trunk. Min cuing for hand placement and technique  Transfers Overall transfer level: Needs assistance Equipment used: None Transfers: Sit to/from Stand Sit to Stand: Mod assist         General transfer comment: mod lifting assistance to stand from bed without use of AD  Ambulation/Gait Ambulation/Gait assistance: Min guard Gait Distance (Feet): 120 Feet Assistive device: Rolling walker (2 wheeled)       General Gait Details: Decreased step length with shuffling gait, decreased speed, toe drag on R, slow turns. No external support required by therapist to stabilize and no overt LOB  Stairs            Wheelchair Mobility    Modified Rankin (Stroke Patients Only)       Balance Overall balance assessment: Needs assistance Sitting-balance support: No upper extremity supported Sitting balance-Leahy Scale: Good     Standing balance support: No upper extremity supported Standing balance-Leahy Scale: Fair Standing balance comment: Patient was able to  close his eyes feet together with mild unsteadiness, unable/unwilling to attempt single leg balance or take a step forward without UE support                             Pertinent Vitals/Pain Pain Assessment: No/denies pain    Home Living Family/patient expects to be discharged to::  Private residence Living Arrangements: Alone Available Help at Discharge: Family Type of Home: House Home Access: Stairs to enter Entrance Stairs-Rails: Can reach both Entrance Stairs-Number of Steps: 2 Home Layout: One level Home Equipment: Cane - single point      Prior Function Level of Independence: Independent         Comments: Patient reports indendence with ADL/IADLs. No falls in the last 12 months. Patient drives.     Hand Dominance   Dominant Hand: Right    Extremity/Trunk Assessment   Upper Extremity Assessment Upper Extremity Assessment: Generalized weakness;RUE deficits/detail RUE Deficits / Details: Strength deficits, and coordination. Unable to oppose fingers to thumb RUE Sensation: WNL RUE Coordination: decreased fine motor    Lower Extremity Assessment Lower Extremity Assessment: Defer to PT evaluation RLE Deficits / Details: Decreased strength, foot drop during gait, shuffling steps, hip flexion: 3/5 on R, 4/5 on L, knee flexion 3+/5 on R, 4/5 on L, ankle dorsiflexion: 3+/5 on R, 4-/5 on L. Limited ROM during heel to shin coordination testing. RLE Sensation: WNL RLE Coordination: decreased gross motor       Communication   Communication: Expressive difficulties  Cognition Arousal/Alertness: Awake/alert Behavior During Therapy: WFL for tasks assessed/performed Overall Cognitive Status: No family/caregiver present to determine baseline cognitive functioning                                 General Comments: Patient AO x 2, disoriented to date. No family available to determine baseline.      General Comments      Exercises     Assessment/Plan    PT Assessment Patient needs continued PT services  PT Problem List Decreased strength;Decreased balance;Decreased coordination;Decreased cognition       PT Treatment Interventions Gait training;Therapeutic exercise;Balance training;Neuromuscular re-education;Stair training;DME  instruction    PT Goals (Current goals can be found in the Care Plan section)  Acute Rehab PT Goals Patient Stated Goal: Return to prior level of function. PT Goal Formulation: With patient Time For Goal Achievement: 09/26/19 Potential to Achieve Goals: Good    Frequency Min 2X/week   Barriers to discharge        Co-evaluation               AM-PAC PT "6 Clicks" Mobility  Outcome Measure Help needed turning from your back to your side while in a flat bed without using bedrails?: None Help needed moving from lying on your back to sitting on the side of a flat bed without using bedrails?: None Help needed moving to and from a bed to a chair (including a wheelchair)?: A Little Help needed standing up from a chair using your arms (e.g., wheelchair or bedside chair)?: A Little Help needed to walk in hospital room?: A Little Help needed climbing 3-5 steps with a railing? : A Lot 6 Click Score: 19    End of Session Equipment Utilized During Treatment: Gait belt Activity Tolerance: Patient tolerated treatment well Patient left: in bed;with call bell/phone within reach;with bed alarm set  PT Visit Diagnosis: Unsteadiness on feet (R26.81);Muscle weakness (generalized) (M62.81)    Time: 2258-3462 PT Time Calculation (min) (ACUTE ONLY): 30 min   Charges:   PT Evaluation $PT Eval Moderate Complexity: 1 Mod          Kash Davie D Neco Kling PT, DPT, GCS   Cailie Bosshart 09/12/2019, 12:22 PM

## 2019-09-12 NOTE — Transfer of Care (Signed)
Immediate Anesthesia Transfer of Care Note  Patient: Christopher Delacruz  Procedure(s) Performed: COLONOSCOPY WITH PROPOFOL (N/A )  Patient Location: PACU and Endoscopy Unit  Anesthesia Type:General  Level of Consciousness: sedated  Airway & Oxygen Therapy: Patient Spontanous Breathing and Patient connected to nasal cannula oxygen  Post-op Assessment: Report given to RN and Post -op Vital signs reviewed and stable  Post vital signs: Reviewed and stable  Last Vitals:  Vitals Value Taken Time  BP    Temp    Pulse    Resp    SpO2      Last Pain:  Vitals:   09/12/19 0738  TempSrc:   PainSc: 0-No pain         Complications: No complications documented.

## 2019-09-12 NOTE — Progress Notes (Signed)
Nutrition Follow Up Note   DOCUMENTATION CODES:   Obesity unspecified  INTERVENTION:   Boost Breeze po TID, each supplement provides 250 kcal and 9 grams of protein  MVI daily   Ensure Enlive po BID with diet advancement, each supplement provides 350 kcal and 20 grams of protein  Pt at refeed risk; recommend monitor K, Mg and P labs daily until stable  NUTRITION DIAGNOSIS:   Inadequate oral intake related to acute illness as evidenced by per patient/family report.  GOAL:   Patient will meet greater than or equal to 90% of their needs  -not met   MONITOR:   PO intake, Supplement acceptance, Labs, Weight trends, Diet advancement, Skin, I & O's  ASSESSMENT:   69 y.o. male with medical history significant of stroke with mild right-sided weakness on Plavix, hypertension, hyperlipidemia, diet-controlled diabetes, upper GI bleeding, BPH, CKD stage III, who presents with rectal incontinence and bloody diarrhea.   Pt continues to be on a NPO/clear liquid diet and is now without adequate nutrition for 6 days. Pt is eating 100% of his clear liquid diet. Pt unable to have colonoscopy today r/t poor preparation; plan is for repeat colonoscopy tomorrow. RD will add back Boost Breeze since pt is on clear liquids; recommend Ensure with diet advancement. Pt is at refeed risk. Per chart, pt is weight stable since admit.   Medications reviewed and include: protonix, MVI, B12, LRS @75ml/hr  Labs reviewed: K 4.4 wnl, Mg 2.1 wnl Hgb 8.9(L), Hct 29.4(L), MCV 74.6(L), MCH 22.6(L)  Diet Order:   Diet Order            Diet clear liquid Room service appropriate? Yes; Fluid consistency: Thin  Diet effective now                EDUCATION NEEDS:   Education needs have been addressed  Skin:  Skin Assessment: Reviewed RN Assessment  Last BM:  8/16- type 7  Height:   Ht Readings from Last 1 Encounters:  09/11/19 6' (1.829 m)    Weight:   Wt Readings from Last 1 Encounters:  09/11/19  102.4 kg    Ideal Body Weight:  80.9 kg  BMI:  Body mass index is 30.62 kg/m.  Estimated Nutritional Needs:   Kcal:  2300-2600kcal/day  Protein:  115-130g/day  Fluid:  >2L/day    MS, RD, LDN Please refer to [facility] for RD and/or RD on-call/weekend/after hours pager 

## 2019-09-12 NOTE — H&P (Signed)
Christopher Bellows, MD [address] [place], [place], [place], [address] [address], [place], [place], [place], [address] Phone: [phone]  Fax: [phone]  Primary Care Physician:  Donnie Coffin, MD   Pre-Procedure History & Physical: HPI:  Christopher Delacruz is a 69 y.o. male is here for an colonoscopy.   Past Medical History:  Diagnosis Date  . CVA (cerebral vascular accident) ([place])   . Diabetes mellitus ([place])   . Hyperlipidemia   . Hypertension     Past Surgical History:  Procedure Laterality Date  . COLONOSCOPY N/A 09/11/2019   Procedure: COLONOSCOPY;  Surgeon: [name], MD;  Location: [facility] ENDOSCOPY;  Service: Endoscopy;  Laterality: N/A;  . ESOPHAGOGASTRODUODENOSCOPY N/A 09/11/2019   Procedure: ESOPHAGOGASTRODUODENOSCOPY (EGD);  Surgeon: [name], MD;  Location: [facility] ENDOSCOPY;  Service: Endoscopy;  Laterality: N/A;  . EXPLORE EYE SOCKET      Prior to Admission medications   Medication Sig Start Date End Date Taking? Authorizing Provider  amLODipine (NORVASC) 10 MG tablet Take 1 tablet (10 mg total) by mouth daily. 03/23/18  Yes Demetrios Loll, MD  clopidogrel (PLAVIX) 75 MG tablet Take 1 tablet (75 mg total) by mouth daily. 03/23/18  Yes Demetrios Loll, MD  finasteride (PROSCAR) 5 MG tablet Take 5 mg by mouth daily. 08/29/19  Yes [provider]  tamsulosin (FLOMAX) 0.4 MG CAPS capsule Take 1 capsule (0.4 mg total) by mouth daily. 11/30/17  Yes Bjorn Loser, MD    Allergies as of 09/06/2019  . (No Known Allergies)    Family History  Problem Relation Age of Onset  . Heart attack Sister   . Leukemia Brother   . Stroke Brother     Social History   Socioeconomic History  . Marital status: Divorced    Spouse name: Not on file  . Number of children: Not on file  . Years of education: Not on file  . Highest education level: Not on file  Occupational History  . Not on file  Tobacco Use  . Smoking status: Never Smoker  . Smokeless tobacco: Never  Used  Vaping Use  . Vaping Use: Never used  Substance and Sexual Activity  . Alcohol use: Never    Alcohol/week: 0.0 standard drinks  . Drug use: Never  . Sexual activity: Not on file  Other Topics Concern  . Not on file  Social History Narrative  . Not on file   Social Determinants of Health   Financial Resource Strain:   . Difficulty of Paying Living Expenses:   Food Insecurity:   . Worried About Charity fundraiser in the Last Year:   . Arboriculturist in the Last Year:   Transportation Needs:   . Film/video editor (Medical):   Marland Kitchen Lack of Transportation (Non-Medical):   Physical Activity:   . Days of Exercise per Week:   . Minutes of Exercise per Session:   Stress:   . Feeling of Stress :   Social Connections:   . Frequency of Communication with Friends and Family:   . Frequency of Social Gatherings with Friends and Family:   . Attends Religious Services:   . Active Member of Clubs or Organizations:   . Attends Archivist Meetings:   Marland Kitchen Marital Status:   Intimate Partner Violence:   . Fear of Current or Ex-Partner:   . Emotionally Abused:   Marland Kitchen Physically Abused:   . Sexually Abused:     Review of  Systems: See HPI, otherwise negative ROS  Physical Exam: BP (!) 153/55 (BP Location: Left Arm)   Pulse 66   Temp 98.1 F (36.7 C) (Oral)   Resp 18   Ht 6' (1.829 m)   Wt 102.4 kg   SpO2 93%   BMI 30.62 kg/m  General:   Alert,  pleasant and cooperative in NAD Head:  Normocephalic and atraumatic. Neck:  Supple; no masses or thyromegaly. Lungs:  Clear throughout to auscultation, normal respiratory effort.    Heart:  +S1, +S2, Regular rate and rhythm, No edema. Abdomen:  Soft, nontender and nondistended. Normal bowel sounds, without guarding, and without rebound.   Neurologic:  Alert and  oriented x4;  grossly normal neurologically.  Impression/Plan: Christopher Delacruz is here for an colonoscopy to be performed for  Iron deficiency anemia. Risks,  benefits, limitations, and alternatives regarding  colonoscopy have been reviewed with the patient.  Questions have been answered.  All parties agreeable.   Christopher Bellows, MD  09/12/2019, 11:58 AM

## 2019-09-12 NOTE — Evaluation (Signed)
Occupational Therapy Evaluation Patient Details Name: Christopher Delacruz MRN: [id number] DOB: 23-Apr-1950 Today's Date: 09/12/2019    History of Present Illness Christopher Delacruz is a 69 y.o. male with medical history significant of stroke with mild right-sided weakness on Plavix, hypertension, hyperlipidemia, diet-controlled diabetes, upper GI bleeding, BPH, CKD stage III, who presented with rectal incontinence and bloody diarrhea. Patient called his nephew and told him that he had incontinence of stool and wanted to go to hospital.  Patient has generalized weakness.  Pt had bloody diarrhea in the emergency room with dark-colored blood. Patient had mild lower abdominal pain. No nausea, vomiting.  Patient denies chest pain, shortness breath, cough, fever or chills.  No symptoms of UTI. Patient was found to have positive FOBT. He is currently admitted for GIB with anemia.    Clinical Impression   Patient presenting with decreased I in self care, balance, functional mobility/transfers, endurance, strength and safety awareness. Patient reports being mod I PTA. Living alone and using SPC as needed for mobility tasks. He stands in walk in shower for bathing tasks. Patient currently functioning at min A for bed mobility from flat bed, set up A UB self care, and mod - max A for LB self care. Pt standing without use of AD with mod A and side stepping with min A to the R. Pt unable to fully don/doff socks onto feet without assist. Pt reports family living near him but unsure of families ability to assist as pt is poor historian. Patient will benefit from acute OT to increase overall independence in the areas of ADLs, functional mobility, and safety awareness in order to safely discharge home with caregiver.    Follow Up Recommendations  Home health OT;Supervision/Assistance - 24 hour    Equipment Recommendations  Tub/shower seat;3 in 1 bedside commode    Recommendations for Other Services Other (comment) (none at this  time)     Precautions / Restrictions Precautions Precautions: Fall Restrictions Weight Bearing Restrictions: No      Mobility Bed Mobility Overal bed mobility: Needs Assistance Bed Mobility: Supine to Sit;Sit to Supine     Supine to sit: Min assist Sit to supine: Min assist   General bed mobility comments: from flat bed with increased time and assist with trunk. Min cuing for hand placement and technique  Transfers Overall transfer level: Needs assistance Equipment used: None Transfers: Sit to/from Stand Sit to Stand: Mod assist         General transfer comment: mod lifting assistance to stand from bed without use of AD    Balance Overall balance assessment: Needs assistance Sitting-balance support: No upper extremity supported Sitting balance-Leahy Scale: Good     Standing balance support: No upper extremity supported Standing balance-Leahy Scale: Poor Standing balance comment: reliance of UE support for balance           ADL either performed or assessed with clinical judgement   ADL Overall ADL's : Needs assistance/impaired     Grooming: Wash/dry hands;Wash/dry face;Sitting;Set up          General ADL Comments: Pt with close supervision for balance while seated on EOB. Set up A for UB self care and anticipate mod - max A for LB self care. Pt was able to partially don sock but reports increased fatigue and unable to pull all the way on.     Vision Baseline Vision/History: Wears glasses Wears Glasses: At all times Patient Visual Report: No change from baseline Vision Assessment?: No apparent visual deficits  Pertinent Vitals/Pain Pain Assessment: No/denies pain     Hand Dominance Right   Extremity/Trunk Assessment Upper Extremity Assessment Upper Extremity Assessment: Generalized weakness;RUE deficits/detail RUE Deficits / Details: Strength deficits, and coordination. Unable to oppose fingers to thumb RUE Sensation: WNL RUE  Coordination: decreased fine motor   Lower Extremity Assessment Lower Extremity Assessment: Defer to PT evaluation RLE Deficits / Details: Decreased strength, foot drop during gait, shuffling steps, hip flexion: 3/5 on R, 4/5 on L, knee flexion 3+/5 on R, 4/5 on L, ankle dorsiflexion: 3+/5 on R, 4-/5 on L. Limited ROM during heel to shin coordination testing. RLE Sensation: WNL RLE Coordination: decreased gross motor       Communication Communication Communication: Expressive difficulties   Cognition Arousal/Alertness: Awake/alert Behavior During Therapy: WFL for tasks assessed/performed Overall Cognitive Status: No family/caregiver present to determine baseline cognitive functioning    General Comments: Patient AO x 2, disoriented to date. No family available to determine baseline.              Home Living Family/patient expects to be discharged to:: Private residence Living Arrangements: Alone Available Help at Discharge: Family Type of Home: House Home Access: Stairs to enter Technical brewer of Steps: 2 Entrance Stairs-Rails: Can reach both Home Layout: One level     Bathroom Shower/Tub: Teacher, early years/pre: Standard Bathroom Accessibility: Yes How Accessible: Other (comment) ([place]) Home Equipment: Cane - single point          Prior Functioning/Environment Level of Independence: Independent        Comments: Patient reports indendence with ADL/IADLs. No falls in the last 12 months. Patient drives.        OT Problem List: Decreased strength;Decreased cognition;Decreased safety awareness;Decreased activity tolerance;Decreased knowledge of use of DME or AE;Impaired balance (sitting and/or standing);Decreased coordination      OT Treatment/Interventions: Self-care/ADL training;Therapeutic exercise;Therapeutic activities;Cognitive remediation/compensation;Energy conservation;Patient/family education;DME and/or AE instruction;Balance  training;Modalities    OT Goals(Current goals can be found in the care plan section) Acute Rehab OT Goals Patient Stated Goal: return home OT Goal Formulation: With patient Time For Goal Achievement: 09/26/19 Potential to Achieve Goals: Good ADL Goals Pt Will Perform Grooming: standing (with LRAD) Pt Will Perform Lower Body Dressing: sit to/from stand;with modified independence Pt Will Transfer to Toilet: with modified independence;ambulating (with LRAD) Pt Will Perform Toileting - Clothing Manipulation and hygiene: with modified independence;sit to/from stand (with LRAD)  OT Frequency: Min 1X/week   Barriers to D/C: Decreased caregiver support  Pt lives alone but reports family is available. Unsure of level of family involvement secondary to pt with cognitive deficits this session.          AM-PAC OT "6 Clicks" Daily Activity     Outcome Measure Help from another person eating meals?: A Little Help from another person taking care of personal grooming?: A Little Help from another person toileting, which includes using toliet, bedpan, or urinal?: A Lot Help from another person bathing (including washing, rinsing, drying)?: A Lot Help from another person to put on and taking off regular upper body clothing?: A Little Help from another person to put on and taking off regular lower body clothing?: A Lot 6 Click Score: 15   End of Session Nurse Communication: Mobility status  Activity Tolerance: Patient tolerated treatment well Patient left: in bed;with call bell/phone within reach;Other (comment) (transitioned to PT session)  OT Visit Diagnosis: Unsteadiness on feet (R26.81);Muscle weakness (generalized) (M62.81)  Time: 1552-0802 OT Time Calculation (min): 20 min Charges:  OT General Charges $OT Visit: 1 Visit OT Evaluation $OT Eval Moderate Complexity: 1 Mod OT Treatments $Therapeutic Activity: 8-22 mins  Darleen Crocker, MS, OTR/L , CBIS ascom [phone]   09/12/19, 12:38 PM

## 2019-09-12 NOTE — Op Note (Signed)
[facility] Gastroenterology Patient Name: Christopher Delacruz Procedure Date: 09/12/2019 12:14 PM MRN: [id number] Account #: [id number] Date of Birth: [birth year] Admit Type: Inpatient Age: 69 Room: [facility] ENDO ROOM 4 Gender: Male Note Status: Finalized Procedure:             Colonoscopy Indications:           Iron deficiency anemia Providers:             [name] MD, MD Referring MD:          Edmonia Lynch. Aycock MD (Referring MD) Medicines:             Monitored Anesthesia Care Complications:         No immediate complications. Procedure:             Pre-Anesthesia Assessment:                        - Prior to the procedure, a History and Physical was                         performed, and patient medications, allergies and                         sensitivities were reviewed. The patient's tolerance                         of previous anesthesia was reviewed.                        - The risks and benefits of the procedure and the                         sedation options and risks were discussed with the                         patient. All questions were answered and informed                         consent was obtained.                        - ASA Grade Assessment: III - A patient with severe                         systemic disease.                        After obtaining informed consent, the colonoscope was                         passed under direct vision. Throughout the procedure,                         the patient's blood pressure, pulse, and oxygen                         saturations were monitored continuously. The                         Colonoscope was introduced through the anus with the  intention of advancing to the cecum. The scope was                         advanced to the sigmoid colon before the procedure was                         aborted. Medications were given. The colonoscopy was                         performed with ease.  The patient tolerated the                         procedure well. The quality of the bowel preparation                         was poor. Findings:      The perianal and digital rectal examinations were normal.      A large amount of semi-liquid stool was found in the entire colon,       interfering with visualization. Impression:            - Preparation of the colon was poor.                        - Stool in the entire examined colon.                        - No specimens collected. Recommendation:        - Return patient to hospital ward prior to repeat                         procedure.                        - Clear liquid diet today.                        - Repeat colonoscopy tomorrow because the bowel                         preparation was poor. Procedure Code(s):     --- Professional ---                        [phone], 30, Colonoscopy, flexible; diagnostic,                         including collection of specimen(s) by brushing or                         washing, when performed (separate procedure) Diagnosis Code(s):     --- Professional ---                        D50.9, Iron deficiency anemia, unspecified CPT copyright 2019 American Medical Association. All rights reserved. The codes documented in this report are preliminary and upon coder review may  be revised to meet current compliance requirements. [name], MD [name] MD, MD 09/12/2019 12:32:42 PM This report has been signed electronically. Number of Addenda: 0 Note Initiated On: 09/12/2019 12:14 PM Total Procedure Duration: 0  hours 3 minutes 29 seconds  Estimated Blood Loss:  Estimated blood loss: none.      [facility]

## 2019-09-12 NOTE — Anesthesia Postprocedure Evaluation (Signed)
Anesthesia Post Note  Patient: Christopher Delacruz  Procedure(s) Performed: COLONOSCOPY WITH PROPOFOL (N/A )  Patient location during evaluation: Endoscopy Anesthesia Type: General Level of consciousness: awake and alert and oriented Pain management: pain level controlled Vital Signs Assessment: post-procedure vital signs reviewed and stable Respiratory status: spontaneous breathing Cardiovascular status: blood pressure returned to baseline Anesthetic complications: no   No complications documented.   Last Vitals:  Vitals:   09/12/19 1257 09/12/19 1349  BP:  136/76  Pulse: 66 65  Resp: (!) 25 17  Temp:  36.7 C  SpO2: 97% 93%    Last Pain:  Vitals:   09/12/19 1349  TempSrc: Oral  PainSc:                  Aleph Nickson

## 2019-09-12 NOTE — Progress Notes (Signed)
PROGRESS NOTE    Christopher Delacruz  VFI:[id number] DOB: 1950/05/17 DOA: 09/07/2019 PCP: Donnie Coffin, MD   Chief complaint.  Rectal bleeding.  Brief Narrative:  Christopher Delacruz a 69 y.o.malewith medical history significant ofstroke withmildright-sided weakness onPlavix, hypertension, hyperlipidemia, diet-controlled diabetes, upper GI bleeding, BPH, CKD stage III, who presents with rectal incontinenceand bloody diarrhea.  Patient received 2 units of PRBC. Hemoglobin has been stable. GI consult is obtained, scheduled colonoscopy on 8/15.  8/15.  EGD showed some erosion in the duodenum.  Colonoscopy was a poor prep, suspect colon mass.  Repeat colonoscopy is scheduled for tomorrow.  8/16.  Poor preparation for colonoscopy today, colonoscopy will be redone tomorrow.   Assessment & Plan:   Principal Problem:   GIB (gastrointestinal bleeding) Active Problems:   HTN (hypertension)   HLD (hyperlipidemia)   CVA (cerebral vascular accident) ([place])   BPH (benign prostatic hyperplasia)   Acute renal failure superimposed on stage 3a chronic kidney disease (HCC)   Microcytic anemia   Anemia due to blood loss   Intestinal neoplasm  #1.  Acute blood loss anemia secondary to GI bleed. Status post 2 units of PRBC.  Currently patient does not have any active bleeding.  Scheduled for colonoscopy again tomorrow.  2.  Acute kidney injury superimposed on stage III chronic kidney disease. Renal function improved.  3.  Essential hypertension. Continue home medicines.   DVT prophylaxis: SCDs Code Status: Full Family Communication:None Disposition Plan:   Patient came from: Home                                                                                                                           Anticipated d/c place: Home  Barriers to d/c OR conditions which need to be met to effect a safe d/c:   Consultants:   GI  Procedures: None Antimicrobials:  None    Subjective: Colonoscopy could not be completed due to poor preparation today.  Currently patient has no complaints.  Colonoscopy rescheduled for tomorrow.  Objective: Vitals:   09/12/19 1137 09/12/19 1237 09/12/19 1247 09/12/19 1257  BP: (!) 93/53 124/61 (!) 116/51   Pulse: 82 72 70 66  Resp:  (!) 32 (!) 24 (!) 25  Temp: (!) 97.5 F (36.4 C) (!) 97.5 F (36.4 C)    TempSrc: Temporal     SpO2: 97% 91% 97% 97%  Weight:      Height:        Intake/Output Summary (Last 24 hours) at 09/12/2019 1346 Last data filed at 09/12/2019 1237 Gross per 24 hour  Intake 616.09 ml  Output --  Net 616.09 ml   Filed Weights   09/06/19 1928 09/08/19 1107 09/11/19 1450  Weight: 113.4 kg 102.4 kg 102.4 kg    Examination:  General exam: Appears calm and comfortable  Respiratory system: Clear to auscultation. Respiratory effort normal. Cardiovascular system: S1 & S2 heard, RRR. No JVD, murmurs, rubs, gallops or clicks. No pedal edema. Gastrointestinal system:  Abdomen is nondistended, soft and nontender. No organomegaly or masses felt. Normal bowel sounds heard. Central nervous system: Alert and oriented. No focal neurological deficits. Extremities: Symmetric  Skin: No rashes, lesions or ulcers Psychiatry:  Mood & affect appropriate.     Data Reviewed: I have personally reviewed following labs and imaging studies  CBC: Recent Labs  Lab 09/06/19 1935 09/07/19 0722 09/08/19 0728 09/08/19 1855 09/09/19 0654 09/11/19 0936 09/12/19 0747  WBC 8.9   < > 9.4 9.5 9.9 10.1 8.5  NEUTROABS 5.4  --   --   --   --  7.1 5.6  HGB 6.7*   < > 8.6* 9.1* 9.0* 9.0* 8.9*  HCT 23.4*   < > 28.8* 28.5* 28.3* 30.1* 29.4*  MCV 73.6*   < > 73.8* 72.0* 72.6* 74.3* 74.6*  PLT 299   < > 298 289 273 254 211   < > = values in this interval not displayed.   Basic Metabolic Panel: Recent Labs  Lab 09/06/19 1935 09/08/19 0127 09/11/19 0936 09/12/19 0747  NA 142 144 143 140  K 4.0 3.6 4.2 4.4  CL  109 113* 110 107  CO2 _0 GLUCOSE 99 79 86 88  BUN 25* _1 CREATININE 2.35* 1.43* 1.36* 1.63*  CALCIUM 8.5* 8.6* 8.8* 8.9  MG  --   --  1.9 2.1   GFR: Estimated Creatinine Clearance: 52.2 mL/min (A) (by C-G formula based on SCr of 1.63 mg/dL (H)). Liver Function Tests: Recent Labs  Lab 09/06/19 1935  AST 16  ALT 10  ALKPHOS 65  BILITOT 0.8  PROT 6.6  ALBUMIN 3.2*   Recent Labs  Lab 09/06/19 1935  LIPASE 69*   No results for input(s): AMMONIA in the last 168 hours. Coagulation Profile: Recent Labs  Lab 09/07/19 1430  INR 1.0   Cardiac Enzymes: No results for input(s): CKTOTAL, CKMB, CKMBINDEX, TROPONINI in the last 168 hours. BNP (last 3 results) No results for input(s): PROBNP in the last 8760 hours. HbA1C: No results for input(s): HGBA1C in the last 72 hours. CBG: No results for input(s): GLUCAP in the last 168 hours. Lipid Profile: No results for input(s): CHOL, HDL, LDLCALC, TRIG, CHOLHDL, LDLDIRECT in the last 72 hours. Thyroid Function Tests: No results for input(s): TSH, T4TOTAL, FREET4, T3FREE, THYROIDAB in the last 72 hours. Anemia Panel: No results for input(s): VITAMINB12, FOLATE, FERRITIN, TIBC, IRON, RETICCTPCT in the last 72 hours. Sepsis Labs: No results for input(s): PROCALCITON, LATICACIDVEN in the last 168 hours.  Recent Results (from the past 240 hour(s))  SARS Coronavirus 2 by RT PCR (hospital order, performed in [facility] hospital lab) Nasopharyngeal Nasopharyngeal Swab     Status: None   Collection Time: 09/07/19  7:22 AM   Specimen: Nasopharyngeal Swab  Result Value Ref Range Status   SARS Coronavirus 2 NEGATIVE NEGATIVE Final    Comment: (NOTE) SARS-CoV-2 target nucleic acids are NOT DETECTED.  The SARS-CoV-2 RNA is generally detectable in upper and lower respiratory specimens during the acute phase of infection. The lowest concentration of SARS-CoV-2 viral copies this assay can detect is 250 copies / mL. A negative  result does not preclude SARS-CoV-2 infection and should not be used as the sole basis for treatment or other patient management decisions.  A negative result may occur with improper specimen collection / handling, submission of specimen other than nasopharyngeal swab, presence of viral mutation(s) within the areas targeted by this assay, and inadequate number  of viral copies (<250 copies / mL). A negative result must be combined with clinical observations, patient history, and epidemiological information.  Fact Sheet for Patients:   StrictlyIdeas.no  Fact Sheet for Healthcare Providers: BankingDealers.co.za  This test is not yet approved or  cleared by the [place] FDA and has been authorized for detection and/or diagnosis of SARS-CoV-2 by FDA under an Emergency Use Authorization (EUA).  This EUA will remain in effect (meaning this test can be used) for the duration of the COVID-19 declaration under Section 564(b)(1) of the Act, 21 U.S.C. section 360bbb-3(b)(1), unless the authorization is terminated or revoked sooner.  Performed at [facility], [address] [place], [place] [address]   Gastrointestinal Panel by PCR , Stool     Status: None   Collection Time: 09/07/19  3:58 PM   Specimen: Stool  Result Value Ref Range Status   Campylobacter species NOT DETECTED NOT DETECTED Final   Plesimonas shigelloides NOT DETECTED NOT DETECTED Final   Salmonella species NOT DETECTED NOT DETECTED Final   Yersinia enterocolitica NOT DETECTED NOT DETECTED Final   Vibrio species NOT DETECTED NOT DETECTED Final   Vibrio cholerae NOT DETECTED NOT DETECTED Final   Enteroaggregative E coli (EAEC) NOT DETECTED NOT DETECTED Final   Enteropathogenic E coli (EPEC) NOT DETECTED NOT DETECTED Final   Enterotoxigenic E coli (ETEC) NOT DETECTED NOT DETECTED Final   Shiga like toxin producing E coli (STEC) NOT DETECTED NOT DETECTED Final    Shigella/Enteroinvasive E coli (EIEC) NOT DETECTED NOT DETECTED Final   Cryptosporidium NOT DETECTED NOT DETECTED Final   Cyclospora cayetanensis NOT DETECTED NOT DETECTED Final   Entamoeba histolytica NOT DETECTED NOT DETECTED Final   Giardia lamblia NOT DETECTED NOT DETECTED Final   Adenovirus F40/41 NOT DETECTED NOT DETECTED Final   Astrovirus NOT DETECTED NOT DETECTED Final   Norovirus GI/GII NOT DETECTED NOT DETECTED Final   Rotavirus A NOT DETECTED NOT DETECTED Final   Sapovirus (I, II, IV, and V) NOT DETECTED NOT DETECTED Final    Comment: Performed at [facility], [address], [place], [place] [address]         Radiology Studies: No results found.      Scheduled Meds: . amLODipine  10 mg Oral Daily  . feeding supplement  1 Container Oral TID BM  . finasteride  5 mg Oral Daily  . multivitamin with minerals  1 tablet Oral Daily  . pantoprazole  40 mg Oral BID AC  . polyethylene glycol-electrolytes  4,000 mL Oral Once  . tamsulosin  0.4 mg Oral Daily  . vitamin B-12  1,000 mcg Oral Daily   Continuous Infusions: . sodium chloride Stopped (09/11/19 1355)  . lactated ringers 555 mL/hr at 09/12/19 1217     LOS: 5 days    Time spent: 21 minutes    Sharen Hones, MD [facility]   To contact the attending provider between 7A-7P or the covering provider during after hours 7P-7A, please log into the web site [email or web address] and access using universal Cable password for that web site. If you do not have the password, please call the hospital operator.  09/12/2019, 1:46 PM

## 2019-09-13 ENCOUNTER — Inpatient Hospital Stay: Payer: Medicare Other

## 2019-09-13 ENCOUNTER — Inpatient Hospital Stay: Payer: Medicare Other | Admitting: Anesthesiology

## 2019-09-13 ENCOUNTER — Inpatient Hospital Stay: Admit: 2019-09-13 | Payer: Medicare Other

## 2019-09-13 ENCOUNTER — Encounter
Admission: EM | Disposition: A | Discharge status: Skilled Nursing Facility | Payer: Self-pay | Source: Home / Self Care | Attending: Internal Medicine

## 2019-09-13 ENCOUNTER — Encounter: Payer: Self-pay | Admitting: Gastroenterology

## 2019-09-13 ENCOUNTER — Encounter: Admission: RE | Payer: Self-pay | Source: Home / Self Care

## 2019-09-13 ENCOUNTER — Inpatient Hospital Stay: Admission: RE | Admit: 2019-09-13 | Payer: Medicare Other | Source: Home / Self Care | Admitting: Gastroenterology

## 2019-09-13 DIAGNOSIS — D500 Iron deficiency anemia secondary to blood loss (chronic): Secondary | ICD-10-CM

## 2019-09-13 DIAGNOSIS — C187 Malignant neoplasm of sigmoid colon: Secondary | ICD-10-CM

## 2019-09-13 DIAGNOSIS — K922 Gastrointestinal hemorrhage, unspecified: Secondary | ICD-10-CM

## 2019-09-13 DIAGNOSIS — K2970 Gastritis, unspecified, without bleeding: Secondary | ICD-10-CM

## 2019-09-13 DIAGNOSIS — B9681 Helicobacter pylori [H. pylori] as the cause of diseases classified elsewhere: Secondary | ICD-10-CM

## 2019-09-13 DIAGNOSIS — D518 Other vitamin B12 deficiency anemias: Secondary | ICD-10-CM

## 2019-09-13 DIAGNOSIS — C19 Malignant neoplasm of rectosigmoid junction: Secondary | ICD-10-CM

## 2019-09-13 DIAGNOSIS — D490 Neoplasm of unspecified behavior of digestive system: Secondary | ICD-10-CM

## 2019-09-13 DIAGNOSIS — K635 Polyp of colon: Secondary | ICD-10-CM

## 2019-09-13 HISTORY — PX: COLONOSCOPY WITH PROPOFOL: SHX5780

## 2019-09-13 LAB — BASIC METABOLIC PANEL
Anion gap: 8 (ref 5–15)
BUN: 8 mg/dL (ref 8–23)
CO2: 25 mmol/L (ref 22–32)
Calcium: 8.7 mg/dL — ABNORMAL LOW (ref 8.9–10.3)
Chloride: 107 mmol/L (ref 98–111)
Creatinine, Ser: 1.43 mg/dL — ABNORMAL HIGH (ref 0.61–1.24)
GFR calc Af Amer: 57 mL/min — ABNORMAL LOW (ref >60)
GFR calc non Af Amer: 49 mL/min — ABNORMAL LOW (ref >60)
Glucose, Bld: 86 mg/dL (ref 70–99)
Potassium: 4 mmol/L (ref 3.5–5.1)
Sodium: 140 mmol/L (ref 135–145)

## 2019-09-13 LAB — CBC WITH DIFFERENTIAL/PLATELET
Abs Immature Granulocytes: 0.03 10*3/uL (ref 0.00–0.07)
Basophils Absolute: 0.1 10*3/uL (ref 0.0–0.1)
Basophils Relative: 1 %
Eosinophils Absolute: 0.3 10*3/uL (ref 0.0–0.5)
Eosinophils Relative: 4 %
HCT: 29.8 % — ABNORMAL LOW (ref 39.0–52.0)
Hemoglobin: 8.9 g/dL — ABNORMAL LOW (ref 13.0–17.0)
Immature Granulocytes: 0 %
Lymphocytes Relative: 19 %
Lymphs Abs: 1.6 10*3/uL (ref 0.7–4.0)
MCH: 22.5 pg — ABNORMAL LOW (ref 26.0–34.0)
MCHC: 29.9 g/dL — ABNORMAL LOW (ref 30.0–36.0)
MCV: 75.3 fL — ABNORMAL LOW (ref 80.0–100.0)
Monocytes Absolute: 0.7 10*3/uL (ref 0.1–1.0)
Monocytes Relative: 8 %
Neutro Abs: 5.4 10*3/uL (ref 1.7–7.7)
Neutrophils Relative %: 68 %
Platelets: 256 10*3/uL (ref 150–400)
RBC: 3.96 MIL/uL — ABNORMAL LOW (ref 4.22–5.81)
RDW: 20.2 % — ABNORMAL HIGH (ref 11.5–15.5)
WBC: 8.1 10*3/uL (ref 4.0–10.5)
nRBC: 0 % (ref 0.0–0.2)

## 2019-09-13 LAB — GLUCOSE, CAPILLARY: Glucose-Capillary: 83 mg/dL (ref 70–99)

## 2019-09-13 LAB — MAGNESIUM: Magnesium: 2.1 mg/dL (ref 1.7–2.4)

## 2019-09-13 LAB — SURGICAL PATHOLOGY

## 2019-09-13 SURGERY — COLONOSCOPY WITH PROPOFOL
Anesthesia: General

## 2019-09-13 MED ORDER — IOHEXOL 9 MG/ML PO SOLN
500.0000 mL | ORAL | Status: AC
  Administered 2019-09-13 (×2): 500 mL via ORAL

## 2019-09-13 MED ORDER — SPOT INK MARKER SYRINGE KIT
PACK | SUBMUCOSAL | Status: DC | PRN
  Administered 2019-09-13 (×2): 5 mL via SUBMUCOSAL

## 2019-09-13 MED ORDER — CLARITHROMYCIN 500 MG PO TABS
500.0000 mg | ORAL_TABLET | Freq: Two times a day (BID) | ORAL | Status: DC
  Administered 2019-09-13 – 2019-09-16 (×7): 500 mg via ORAL
  Filled 2019-09-13 (×8): qty 1

## 2019-09-13 MED ORDER — CYANOCOBALAMIN 1000 MCG/ML IJ SOLN
1000.0000 ug | Freq: Once | INTRAMUSCULAR | Status: AC
  Administered 2019-09-13: 1000 ug via INTRAMUSCULAR
  Filled 2019-09-13: qty 1

## 2019-09-13 MED ORDER — PHENYLEPHRINE HCL (PRESSORS) 10 MG/ML IV SOLN
INTRAVENOUS | Status: AC
  Filled 2019-09-13: qty 1

## 2019-09-13 MED ORDER — CLARITHROMYCIN 500 MG PO TABS: 500.0000 mg | ORAL_TABLET | Freq: Two times a day (BID) | ORAL | Status: DC

## 2019-09-13 MED ORDER — FLEET ENEMA 7-19 GM/118ML RE ENEM
1.0000 | ENEMA | Freq: Once | RECTAL | Status: AC
  Administered 2019-09-13: 1 via RECTAL

## 2019-09-13 MED ORDER — EPHEDRINE SULFATE 50 MG/ML IJ SOLN
INTRAMUSCULAR | Status: DC | PRN
  Administered 2019-09-13: 5 mg via INTRAVENOUS

## 2019-09-13 MED ORDER — LIDOCAINE HCL (PF) 2 % IJ SOLN
INTRAMUSCULAR | Status: AC
  Filled 2019-09-13: qty 5

## 2019-09-13 MED ORDER — SODIUM CHLORIDE 0.9 % IV SOLN: INTRAVENOUS | Status: DC

## 2019-09-13 MED ORDER — EPHEDRINE 5 MG/ML INJ
INTRAVENOUS | Status: AC
  Filled 2019-09-13: qty 4

## 2019-09-13 MED ORDER — AMOXICILLIN 500 MG PO CAPS: 1000.0000 mg | ORAL_CAPSULE | Freq: Two times a day (BID) | ORAL | Status: DC

## 2019-09-13 MED ORDER — PROPOFOL 10 MG/ML IV BOLUS
INTRAVENOUS | Status: DC | PRN
  Administered 2019-09-13: 50 mg via INTRAVENOUS

## 2019-09-13 MED ORDER — PROPOFOL 500 MG/50ML IV EMUL
INTRAVENOUS | Status: AC
  Filled 2019-09-13: qty 50

## 2019-09-13 MED ORDER — SODIUM CHLORIDE 0.9 % IV SOLN
300.0000 mg | Freq: Once | INTRAVENOUS | Status: AC
  Administered 2019-09-13: 300 mg via INTRAVENOUS
  Filled 2019-09-13: qty 15

## 2019-09-13 MED ORDER — IOHEXOL 300 MG/ML  SOLN
100.0000 mL | Freq: Once | INTRAMUSCULAR | Status: AC | PRN
  Administered 2019-09-13: 100 mL via INTRAVENOUS

## 2019-09-13 MED ORDER — ENSURE ENLIVE PO LIQD
237.0000 mL | Freq: Three times a day (TID) | ORAL | Status: DC
  Administered 2019-09-13 – 2019-09-16 (×6): 237 mL via ORAL

## 2019-09-13 MED ORDER — PROPOFOL 500 MG/50ML IV EMUL
INTRAVENOUS | Status: DC | PRN
  Administered 2019-09-13: 125 ug/kg/min via INTRAVENOUS

## 2019-09-13 MED ORDER — LIDOCAINE HCL (CARDIAC) PF 100 MG/5ML IV SOSY
PREFILLED_SYRINGE | INTRAVENOUS | Status: DC | PRN
  Administered 2019-09-13: 40 mg via INTRAVENOUS

## 2019-09-13 MED ORDER — AMOXICILLIN 500 MG PO CAPS
1000.0000 mg | ORAL_CAPSULE | Freq: Two times a day (BID) | ORAL | Status: DC
  Administered 2019-09-13 – 2019-09-16 (×7): 1000 mg via ORAL
  Filled 2019-09-13 (×8): qty 2

## 2019-09-13 MED ORDER — PHENYLEPHRINE HCL (PRESSORS) 10 MG/ML IV SOLN
INTRAVENOUS | Status: DC | PRN
  Administered 2019-09-13: 50 ug via INTRAVENOUS

## 2019-09-13 NOTE — Anesthesia Preprocedure Evaluation (Signed)
Anesthesia Evaluation  Patient identified by MRN, date of birth, ID band Patient awake    Reviewed: Allergy & Precautions, H&P , NPO status , reviewed documented beta blocker date and time   Airway Mallampati: II  TM Distance: >3 FB Neck ROM: full    Dental  (+) Upper Dentures, Lower Dentures   Pulmonary           Cardiovascular hypertension, Pt. on medications   02/2018 ECHO IMPRESSIONS    1. The left ventricle has normal systolic function, with an ejection  fraction of 55-60%. The cavity size was normal. There is mildly increased  left ventricular wall thickness. Left ventricular diastolic Doppler  parameters are consistent with impaired  relaxation.  2. The right ventricle has normal systolic function. The cavity was  normal. There is no increase in right ventricular wall thickness.  3. The mitral valve is normal in structure. Mild thickening of the mitral  valve leaflet.  4. The tricuspid valve is normal in structure.  5. The aortic valve is normal in structure. Moderate thickening of the  aortic valve Aortic valve regurgitation is mild by color flow Doppler.   EKG Multiple premature complexes   Neuro/Psych CVA, Residual Symptoms    GI/Hepatic   Endo/Other  diabetes  Renal/GU Renal disease     Musculoskeletal   Abdominal   Peds  Hematology  (+) Blood dyscrasia, anemia ,   Anesthesia Other Findings      Reproductive/Obstetrics                             Anesthesia Physical  Anesthesia Plan  ASA: III and emergent  Anesthesia Plan: General   Post-op Pain Management:    Induction: Intravenous  PONV Risk Score and Plan: Treatment may vary due to age or medical condition, TIVA and Propofol infusion  Airway Management Planned: Nasal Cannula and Natural Airway  Additional Equipment:   Intra-op Plan:   Post-operative Plan:   Informed Consent: I have reviewed the  patients History and Physical, chart, labs and discussed the procedure including the risks, benefits and alternatives for the proposed anesthesia with the patient or authorized representative who has indicated his/her understanding and acceptance.       Plan Discussed with:   Anesthesia Plan Comments:         Anesthesia Quick Evaluation

## 2019-09-13 NOTE — Anesthesia Postprocedure Evaluation (Signed)
Anesthesia Post Note  Patient: Christopher Delacruz  Procedure(s) Performed: COLONOSCOPY WITH PROPOFOL (N/A )  Patient location during evaluation: Endoscopy Anesthesia Type: General Level of consciousness: awake and alert Pain management: pain level controlled Vital Signs Assessment: post-procedure vital signs reviewed and stable Respiratory status: spontaneous breathing and respiratory function stable Cardiovascular status: stable Anesthetic complications: no   No complications documented.   Last Vitals:  Vitals:   09/13/19 1126 09/13/19 1220  BP: (!) 153/75 (!) 89/45  Pulse: 60 67  Resp: 17 19  Temp: 36.9 C (!) 36.2 C  SpO2: 96% 95%    Last Pain:  Vitals:   09/13/19 1220  TempSrc: Temporal  PainSc: Asleep                 Ericah Scotto K

## 2019-09-13 NOTE — Consult Note (Addendum)
[facility] SURGICAL CONSULTATION NOTE (initial) - cpt: 96789   HISTORY OF PRESENT ILLNESS (HPI):  69 y.o. male who initially presented to [facility] ED on 08/11 for diarrhea. At time of presentation, patient had been reporting a history of incontinence of stool for some time but this had recently worsened in the last few days prior to coming to the ED. He denied any associated abdominal pain, nausea, or emesis with these symptoms. He denied any blood in his stool prior to presenting. He did endorse generalized fatigue and weakness. No history of previous abdominal surgeries. Work up in the ED showed a new anemia with Hgb 6.7, no leukocytosis, worsening renal function from baseline with sCr - 2.35. He was admitted to the medicine service, given 2 units pRBCs, and GI was consulted for overt GI bleeding. He had previously been on Plavix as well for CVA with residual right sided weakness. He was seen by Dr Alice Reichert on 08/11 and recommend upper endoscopy and colonoscopy when prepped and Plavix had worn off. He has no history of previous colonoscopy and denied any known FHx of colon cancer. He underwent EGD on 08/15 with Dr Allen Norris which was concerning for H. pylori gastritis. On the same day, he had colonoscopy as well which was concerning for mass at the recto-sigmoid junction. This was biopsied which revealed invasive adenocarcinoma. The remaining examination was limited secondary to poor preporation of the bowel. Repeat colonoscopy was attempted by Dr Vicente Males yesterday (08/16) however this was aborted secondary to poor prep, and this was repeated again today. I have reviewed the report, which showed ulcerated mass 10 to 20 cm from anus.   Surgery is consulted by hospitalist physician Dr. Sharen Hones, MD in this context for evaluation and management of rectosigmoid colon cancer.  PAST MEDICAL HISTORY (PMH):  Past Medical History:  Diagnosis Date  . CVA (cerebral vascular accident) ([place])   . Diabetes  mellitus ([place])   . Hyperlipidemia   . Hypertension      PAST SURGICAL HISTORY ([place]):  Past Surgical History:  Procedure Laterality Date  . COLONOSCOPY N/A 09/11/2019   Procedure: COLONOSCOPY;  Surgeon: [name], MD;  Location: [facility] ENDOSCOPY;  Service: Endoscopy;  Laterality: N/A;  . COLONOSCOPY WITH PROPOFOL N/A 09/12/2019   Procedure: COLONOSCOPY WITH PROPOFOL;  Surgeon: [name], MD;  Location: [facility] ENDOSCOPY;  Service: Gastroenterology;  Laterality: N/A;  . ESOPHAGOGASTRODUODENOSCOPY N/A 09/11/2019   Procedure: ESOPHAGOGASTRODUODENOSCOPY (EGD);  Surgeon: [name], MD;  Location: [facility] ENDOSCOPY;  Service: Endoscopy;  Laterality: N/A;  . EXPLORE EYE SOCKET       MEDICATIONS:  Prior to Admission medications   Medication Sig Start Date End Date Taking? Authorizing Provider  amLODipine (NORVASC) 10 MG tablet Take 1 tablet (10 mg total) by mouth daily. 03/23/18  Yes Demetrios Loll, MD  clopidogrel (PLAVIX) 75 MG tablet Take 1 tablet (75 mg total) by mouth daily. 03/23/18  Yes Demetrios Loll, MD  finasteride (PROSCAR) 5 MG tablet Take 5 mg by mouth daily. 08/29/19  Yes [provider]  tamsulosin (FLOMAX) 0.4 MG CAPS capsule Take 1 capsule (0.4 mg total) by mouth daily. 11/30/17  Yes MacDiarmid, Nicki Reaper, MD     ALLERGIES:  No Known Allergies   SOCIAL HISTORY:  Social History   Socioeconomic History  . Marital status: Divorced    Spouse name: Not on file  . Number of children: Not on file  . Years of education: Not on file  . Highest education level: Not on file  Occupational History  . Not on file  Tobacco Use  . Smoking status: Never Smoker  . Smokeless tobacco: Never Used  Vaping Use  . Vaping Use: Never used  Substance and Sexual Activity  . Alcohol use: Never    Alcohol/week: 0.0 standard drinks  . Drug use: Never  . Sexual activity: Not on file  Other Topics Concern  . Not on file  Social History Narrative  . Not on file   Social Determinants of Health    Financial Resource Strain:   . Difficulty of Paying Living Expenses:   Food Insecurity:   . Worried About Charity fundraiser in the Last Year:   . Arboriculturist in the Last Year:   Transportation Needs:   . Film/video editor (Medical):   Marland Kitchen Lack of Transportation (Non-Medical):   Physical Activity:   . Days of Exercise per Week:   . Minutes of Exercise per Session:   Stress:   . Feeling of Stress :   Social Connections:   . Frequency of Communication with Friends and Family:   . Frequency of Social Gatherings with Friends and Family:   . Attends Religious Services:   . Active Member of Clubs or Organizations:   . Attends Archivist Meetings:   Marland Kitchen Marital Status:   Intimate Partner Violence:   . Fear of Current or Ex-Partner:   . Emotionally Abused:   Marland Kitchen Physically Abused:   . Sexually Abused:      FAMILY HISTORY:  Family History  Problem Relation Age of Onset  . Heart attack Sister   . Leukemia Brother   . Stroke Brother       REVIEW OF SYSTEMS:  Review of Systems  Constitutional: Positive for fever. Negative for chills.  HENT: Negative for congestion and sore throat.   Respiratory: Negative for cough, shortness of breath and wheezing.   Cardiovascular: Negative for chest pain and palpitations.  Gastrointestinal: Positive for abdominal pain. Negative for constipation, diarrhea, nausea and vomiting.  Genitourinary: Negative for dysuria and urgency.  All other systems reviewed and are negative.   VITAL SIGNS:  Temp:  [97.5 F (36.4 C)-98.2 F (36.8 C)] 97.8 F (36.6 C) (08/17 0432) Pulse Rate:  [65-82] 74 (08/17 0432) Resp:  [17-32] 18 (08/17 0432) BP: (93-171)/(51-86) 142/65 (08/17 0432) SpO2:  [91 %-97 %] 97 % (08/17 0432)     Height: 6' (182.9 cm) Weight: 102.4 kg BMI (Calculated): 30.61   INTAKE/OUTPUT:  08/16 0701 - 08/17 0700 In: 1071 [P.O.:960; I.V.:111] Out: 1450 [Urine:1450]  PHYSICAL EXAM:  Physical Exam Vitals and nursing  note reviewed.  Constitutional:      General: He is not in acute distress.    Appearance: Normal appearance. He is obese. He is not ill-appearing.  HENT:     Head: Normocephalic and atraumatic.     Nose: Nose normal.     Mouth/Throat:     Mouth: Mucous membranes are moist.     Pharynx: Oropharynx is clear.  Eyes:     Conjunctiva/sclera: Conjunctivae normal.     Pupils: Pupils are equal, round, and reactive to light.  Cardiovascular:     Rate and Rhythm: Normal rate.     Pulses: Normal pulses.     Heart sounds: Normal heart sounds. No murmur heard.  No friction rub.  Pulmonary:     Effort: Pulmonary effort is normal. No respiratory distress.     Breath sounds: Normal breath sounds.  Abdominal:  General: Abdomen is flat. There is no distension.     Palpations: Abdomen is soft.     Tenderness: There is no abdominal tenderness. There is no guarding or rebound.  Genitourinary:    Comments: deferred Musculoskeletal:     Right lower leg: No edema.     Left lower leg: No edema.  Skin:    General: Skin is warm and dry.     Coloration: Skin is not jaundiced or pale.  Neurological:     Mental Status: He is alert. Mental status is at baseline.     Comments: History of CVA, slurred speech, right sided weakness  Psychiatric:        Mood and Affect: Mood normal.        Behavior: Behavior normal.      Labs:  CBC Latest Ref Rng & Units 09/13/2019 09/12/2019 09/11/2019  WBC 4.0 - 10.5 K/uL 8.1 8.5 10.1  Hemoglobin 13.0 - 17.0 g/dL 8.9(L) 8.9(L) 9.0(L)  Hematocrit 39 - 52 % 29.8(L) 29.4(L) 30.1(L)  Platelets 150 - 400 K/uL 256 211 254   CMP Latest Ref Rng & Units 09/13/2019 09/12/2019 09/11/2019  Glucose 70 - 99 mg/dL 86 88 86  BUN 8 - 23 mg/dL 8 10 12   Creatinine 0.61 - 1.24 mg/dL 1.43(H) 1.63(H) 1.36(H)  Sodium 135 - 145 mmol/L 140 140 143  Potassium 3.5 - 5.1 mmol/L 4.0 4.4 4.2  Chloride 98 - 111 mmol/L 107 107 110  CO2 22 - 32 mmol/L 25 22 27   Calcium 8.9 - 10.3 mg/dL 8.7(L)  8.9 8.8(L)  Total Protein 6.5 - 8.1 g/dL - - -  Total Bilirubin 0.3 - 1.2 mg/dL - - -  Alkaline Phos 38 - 126 U/L - - -  AST 15 - 41 U/L - - -  ALT 0 - 44 U/L - - -     Imaging studies:  No new imaging studies, pending staging work up    Assessment/Plan: (ICD-10's: C18.7) 69 y.o. male with history of GI bleeding found to have non-obstructing rectosigmoid colon mass which was positive for adenocarcinoma on biopsy   - Continue NPO for now; okay for CLD after procedure  - Will initiate H pylori treatment with triple therapy x14 days (Clarithromycin, Amoxicllin + PPI)  - Will add CEA, CT Chest/Abdomen/Pelvis to work up for staging; may also benefit from MRI Abdomen/Pelvis to determine location of mass relative to rectum.   - Once staging complete, may be beneficial to involve oncology to determine whether or not he will benefit from neoadjuvant therapy.    - Patient will likely require robotic laparoscopic left colectomy pending completion of staging work up. Will tentatively plan this for Friday with Dr Dahlia Byes.   - Mointor abdominal examination  - Monitor H&H; transfuse as needed >7.0  - Pain control prn; antiemetics prn  - Further management per primary service   All of the above findings and recommendations were discussed with the patient, and all of patient's questions were answered to his expressed satisfaction.  I did call the patient's Nephew Candida Peeling) who helps the patient make medical decisions regarding the colonoscopy findings and tentative plan of care. All questions were addressed and answered.   Thank you for the opportunity to participate in this patient's care.   -- Edison Simon, PA-C [facility] 09/13/2019, 10:55 AM [phone] M-F: 7am - 4pm

## 2019-09-13 NOTE — H&P (Signed)
[name], MD [address] [place], [place], [place], [address] [address], [place], [place], [place], [address] Phone: [phone]  Fax: [phone]  Primary Care Physician:  Donnie Coffin, MD   Pre-Procedure History & Physical: HPI:  Christopher Delacruz is a 69 y.o. male is here for an colonoscopy.   Past Medical History:  Diagnosis Date  . CVA (cerebral vascular accident) ([place])   . Diabetes mellitus ([place])   . Hyperlipidemia   . Hypertension     Past Surgical History:  Procedure Laterality Date  . COLONOSCOPY N/A 09/11/2019   Procedure: COLONOSCOPY;  Surgeon: [name], MD;  Location: [facility] ENDOSCOPY;  Service: Endoscopy;  Laterality: N/A;  . COLONOSCOPY WITH PROPOFOL N/A 09/12/2019   Procedure: COLONOSCOPY WITH PROPOFOL;  Surgeon: [name], MD;  Location: [facility] ENDOSCOPY;  Service: Gastroenterology;  Laterality: N/A;  . ESOPHAGOGASTRODUODENOSCOPY N/A 09/11/2019   Procedure: ESOPHAGOGASTRODUODENOSCOPY (EGD);  Surgeon: [name], MD;  Location: [facility] ENDOSCOPY;  Service: Endoscopy;  Laterality: N/A;  . EXPLORE EYE SOCKET      Prior to Admission medications   Medication Sig Start Date End Date Taking? Authorizing Provider  amLODipine (NORVASC) 10 MG tablet Take 1 tablet (10 mg total) by mouth daily. 03/23/18  Yes Demetrios Loll, MD  clopidogrel (PLAVIX) 75 MG tablet Take 1 tablet (75 mg total) by mouth daily. 03/23/18  Yes Demetrios Loll, MD  finasteride (PROSCAR) 5 MG tablet Take 5 mg by mouth daily. 08/29/19  Yes [provider]  tamsulosin (FLOMAX) 0.4 MG CAPS capsule Take 1 capsule (0.4 mg total) by mouth daily. 11/30/17  Yes Bjorn Loser, MD    Allergies as of 09/06/2019  . (No Known Allergies)    Family History  Problem Relation Age of Onset  . Heart attack Sister   . Leukemia Brother   . Stroke Brother     Social History   Socioeconomic History  . Marital status: Divorced    Spouse name: Not on file  . Number of children: Not on file  .  Years of education: Not on file  . Highest education level: Not on file  Occupational History  . Not on file  Tobacco Use  . Smoking status: Never Smoker  . Smokeless tobacco: Never Used  Vaping Use  . Vaping Use: Never used  Substance and Sexual Activity  . Alcohol use: Never    Alcohol/week: 0.0 standard drinks  . Drug use: Never  . Sexual activity: Not on file  Other Topics Concern  . Not on file  Social History Narrative  . Not on file   Social Determinants of Health   Financial Resource Strain:   . Difficulty of Paying Living Expenses:   Food Insecurity:   . Worried About Charity fundraiser in the Last Year:   . Arboriculturist in the Last Year:   Transportation Needs:   . Film/video editor (Medical):   Marland Kitchen Lack of Transportation (Non-Medical):   Physical Activity:   . Days of Exercise per Week:   . Minutes of Exercise per Session:   Stress:   . Feeling of Stress :   Social Connections:   . Frequency of Communication with Friends and Family:   . Frequency of Social Gatherings with Friends and Family:   . Attends Religious Services:   . Active Member of Clubs or Organizations:   . Attends Archivist Meetings:   Marland Kitchen Marital Status:   Intimate Partner Violence:   .  Fear of Current or Ex-Partner:   . Emotionally Abused:   Marland Kitchen Physically Abused:   . Sexually Abused:     Review of Systems: See HPI, otherwise negative ROS  Physical Exam: BP (!) 142/65 (BP Location: Left Arm)   Pulse 74   Temp 97.8 F (36.6 C) (Oral)   Resp 18   Ht 6' (1.829 m)   Wt 102.4 kg   SpO2 97%   BMI 30.62 kg/m  General:   Alert,  pleasant and cooperative in NAD Head:  Normocephalic and atraumatic. Neck:  Supple; no masses or thyromegaly. Lungs:  Clear throughout to auscultation, normal respiratory effort.    Heart:  +S1, +S2, Regular rate and rhythm, No edema. Abdomen:  Soft, nontender and nondistended. Normal bowel sounds, without guarding, and without rebound.     Neurologic:  Alert and  oriented x4;  grossly normal neurologically.  Impression/Plan: Christopher Delacruz is here for an colonoscopy to be performed for iron deficiency anemia.  Risks, benefits, limitations, and alternatives regarding  colonoscopy have been reviewed with the patient.  Questions have been answered.  All parties agreeable.   [name], MD  09/13/2019, 11:23 AM

## 2019-09-13 NOTE — Progress Notes (Signed)
Iron IV infusion has not given on scheduled time due to lost of IV access. Night RN aware.

## 2019-09-13 NOTE — Op Note (Signed)
[facility] Gastroenterology Patient Name: Christopher Delacruz Procedure Date: 09/13/2019 11:31 AM MRN: [id number] Account #: [id number] Date of Birth: 1950-04-18 Admit Type: Outpatient Age: 69 Room: [facility] ENDO ROOM 1 Gender: Male Note Status: Finalized Procedure:             Colonoscopy Indications:           Iron deficiency anemia Providers:             [name] MD, MD Medicines:             Monitored Anesthesia Care Complications:         No immediate complications. Procedure:             Pre-Anesthesia Assessment:                        - Prior to the procedure, a History and Physical was                         performed, and patient medications, allergies and                         sensitivities were reviewed. The patient's tolerance                         of previous anesthesia was reviewed.                        - The risks and benefits of the procedure and the                         sedation options and risks were discussed with the                         patient. All questions were answered and informed                         consent was obtained.                        - ASA Grade Assessment: III - A patient with severe                         systemic disease.                        After obtaining informed consent, the colonoscope was                         passed under direct vision. Throughout the procedure,                         the patient's blood pressure, pulse, and oxygen                         saturations were monitored continuously. The                         Colonoscope was introduced through the anus and  advanced to the the cecum, identified by the                         appendiceal orifice. The colonoscopy was somewhat                         difficult due to inadequate bowel prep. The patient                         tolerated the procedure well. The quality of the bowel                         preparation was  poor. Findings:      The perianal and digital rectal examinations were normal.      A 12 mm polyp was found in the descending colon. The polyp was sessile.       The polyp was removed with a hot snare. Resection and retrieval were       complete.      A polypoid and ulcerated partially obstructing large mass was found from       10 to 20 cm proximal to the anus. The mass was circumferential. The mass       measured ten cm in length. In addition, its diameter measured twelve mm.       Oozing was present. Area was tattooed with an injection of Spot (carbon       black). Tatoo placed at proximal and distal aspect of the mass. Prep was       inadequate to detect colon polyps      The exam was otherwise without abnormality on direct and retroflexion       views. Impression:            - Preparation of the colon was poor.                        - One 12 mm polyp in the descending colon, removed                         with a hot snare. Resected and retrieved.                        - Likely malignant partially obstructing tumor from 10                         to 20 cm proximal to the anus. Tattooed.                        - The examination was otherwise normal on direct and                         retroflexion views. Recommendation:        - Return patient to hospital ward for ongoing care.                        - Advance diet as tolerated.                        - Continue present medications.                        -  Await surgey input and staging CT scans to decide on                         surgery. If has surgery will need a colonoscopy after                         to scren for small polyps. no large mases seen in                         remainder of colon Procedure Code(s):     --- Professional ---                        [phone], Colonoscopy, flexible; with removal of                         tumor(s), polyp(s), or other lesion(s) by snare                         technique                         [id number], Colonoscopy, flexible; with directed submucosal                         injection(s), any substance Diagnosis Code(s):     --- Professional ---                        K63.5, Polyp of colon                        D49.0, Neoplasm of unspecified behavior of digestive                         system                        K56.690, Other partial intestinal obstruction                        D50.9, Iron deficiency anemia, unspecified CPT copyright 2019 American Medical Association. All rights reserved. The codes documented in this report are preliminary and upon coder review may  be revised to meet current compliance requirements. [name], MD [name] MD, MD 09/13/2019 12:19:02 PM This report has been signed electronically. Number of Addenda: 0 Note Initiated On: 09/13/2019 11:31 AM Scope Withdrawal Time: 0 hours 22 minutes 13 seconds  Total Procedure Duration: 0 hours 36 minutes 51 seconds  Estimated Blood Loss:  Estimated blood loss: none.      [facility]

## 2019-09-13 NOTE — Anesthesia Procedure Notes (Signed)
Date/Time: 09/13/2019 11:32 AM Performed by: Jerrye Noble, CRNA Pre-anesthesia Checklist: Emergency Drugs available, Patient identified, Suction available and Patient being monitored Oxygen Delivery Method: Nasal cannula

## 2019-09-13 NOTE — Progress Notes (Signed)
Cancellation Note:  Pt currently off floor for procedure.  Will re-attempt PT treatment session at a later date/time as able.  Leitha Bleak, PT 09/13/19, 11:42 AM

## 2019-09-13 NOTE — Consult Note (Signed)
Hematology/Oncology Consult note [facility] [phone] [phone]  Patient Care Team: Donnie Coffin, MD as PCP - General (Family Medicine)   Name of the patient: Christopher Delacruz  [id number]  1950/08/11   Date of visit: 09/13/19 REASON FOR COSULTATION:  Rectosigmoid mass History of presenting illness-  69 y.o. male with PMH significant for stroke with mild right-sided weakness, hypertension, hyperlipidemia, diet-controlled diabetes, BPH, CKD, who presents to ER for evaluation of rectal incontinence and bloody diarrhea. Initial hemoglobin was 6.7 and received PRBC transfusions during current admission. Iron panel also showed iron deficiency and the patient has received IV iron treatments. Gastroenterology evaluated patient and performed colonoscopy and EGD. 09/11/2019 EGD showed duodenal erosions without bleeding 09/11/2019 colonoscopy preparation was poor.  Likely malignant partially obstructing tumor in the rectosigmoid colon area which was biopsied. 09/13/2019, repeat colonoscopy showed 12 mm polyps were found in the descending colon.  Polyp was sessile.  Polyp was resected and retrieved.  A polypoid ulcerated partially obstructing large mass was found from 10 to 20 cm proximal to the anus.  The mass was circumferential, measured 10 cm in length.  In addition the diameter measures 12 mm.  Oozing was present. Surgery and oncology were consulted for further evaluation. CEA has been obtained and is pending.  Patient was also found to have vitamin B-12 deficiency and has been started on vitamin B12 injections. Currently patient reports no abdominal pain.  Denies any cough, shortness of breath, chest pain.  Denies intentional weight loss.  Review of Systems  Constitutional: Positive for fatigue. Negative for appetite change, chills and fever.  HENT:   Negative for hearing loss and voice change.   Eyes: Negative for eye problems and icterus.    Respiratory: Negative for chest tightness, cough and shortness of breath.   Cardiovascular: Negative for chest pain and leg swelling.  Gastrointestinal: Positive for blood in stool and diarrhea. Negative for abdominal distention and abdominal pain.  Endocrine: Negative for hot flashes.  Genitourinary: Negative for difficulty urinating, dysuria and frequency.   Musculoskeletal: Negative for arthralgias.  Skin: Negative for itching and rash.  Neurological: Negative for light-headedness and numbness.  Hematological: Negative for adenopathy. Does not bruise/bleed easily.  Psychiatric/Behavioral: Negative for confusion.    No Known Allergies  Patient Active Problem List   Diagnosis Date Noted  . Intestinal neoplasm   . BPH (benign prostatic hyperplasia) 09/07/2019  . Acute renal failure superimposed on stage 3a chronic kidney disease ([place]) 09/07/2019  . Microcytic anemia 09/07/2019  . GIB (gastrointestinal bleeding) 09/07/2019  . Anemia due to blood loss 09/07/2019  . CVA (cerebral vascular accident) ([place]) 03/22/2018  . Acute CVA (cerebrovascular accident) ([place]) 03/22/2018  . HLD (hyperlipidemia) 03/21/2018  . Right sided weakness 06/26/2015  . Dysphagia 06/26/2015  . Essential hypertension, malignant 06/26/2015  . Acute renal insufficiency 06/26/2015  . Hypokalemia 06/26/2015  . Cerebral infarction ([place]) 06/23/2015  . Diabetes ([place]) 06/23/2015  . HTN (hypertension) 06/23/2015     Past Medical History:  Diagnosis Date  . CVA (cerebral vascular accident) ([place])   . Diabetes mellitus ([place])   . Hyperlipidemia   . Hypertension      Past Surgical History:  Procedure Laterality Date  . COLONOSCOPY N/A 09/11/2019   Procedure: COLONOSCOPY;  Surgeon: [name], MD;  Location: [facility] ENDOSCOPY;  Service: Endoscopy;  Laterality: N/A;  . COLONOSCOPY WITH PROPOFOL N/A 09/12/2019   Procedure: COLONOSCOPY WITH PROPOFOL;  Surgeon: [name], MD;  Location: [facility] ENDOSCOPY;  Service:  Gastroenterology;  Laterality: N/A;  . ESOPHAGOGASTRODUODENOSCOPY N/A 09/11/2019   Procedure: ESOPHAGOGASTRODUODENOSCOPY (EGD);  Surgeon: [name], MD;  Location: [facility] ENDOSCOPY;  Service: Endoscopy;  Laterality: N/A;  . EXPLORE EYE SOCKET      Social History   Socioeconomic History  . Marital status: Divorced    Spouse name: Not on file  . Number of children: Not on file  . Years of education: Not on file  . Highest education level: Not on file  Occupational History  . Not on file  Tobacco Use  . Smoking status: Never Smoker  . Smokeless tobacco: Never Used  Vaping Use  . Vaping Use: Never used  Substance and Sexual Activity  . Alcohol use: Never    Alcohol/week: 0.0 standard drinks  . Drug use: Never  . Sexual activity: Not on file  Other Topics Concern  . Not on file  Social History Narrative  . Not on file   Social Determinants of Health   Financial Resource Strain:   . Difficulty of Paying Living Expenses:   Food Insecurity:   . Worried About Charity fundraiser in the Last Year:   . Arboriculturist in the Last Year:   Transportation Needs:   . Film/video editor (Medical):   Marland Kitchen Lack of Transportation (Non-Medical):   Physical Activity:   . Days of Exercise per Week:   . Minutes of Exercise per Session:   Stress:   . Feeling of Stress :   Social Connections:   . Frequency of Communication with Friends and Family:   . Frequency of Social Gatherings with Friends and Family:   . Attends Religious Services:   . Active Member of Clubs or Organizations:   . Attends Archivist Meetings:   Marland Kitchen Marital Status:   Intimate Partner Violence:   . Fear of Current or Ex-Partner:   . Emotionally Abused:   Marland Kitchen Physically Abused:   . Sexually Abused:      Family History  Problem Relation Age of Onset  . Heart attack Sister   . Leukemia Brother   . Stroke Brother      Current Facility-Administered Medications:  .  0.9 %  sodium chloride infusion, ,  Intravenous, PRN, Sharen Hones, MD, Paused at 09/11/19 1355 .  acetaminophen (TYLENOL) tablet 650 mg, 650 mg, Oral, Q6H PRN, Ivor Costa, MD .  amLODipine (NORVASC) tablet 10 mg, 10 mg, Oral, Daily, Ivor Costa, MD, 10 mg at 09/13/19 1435 .  amoxicillin (AMOXIL) capsule 1,000 mg, 1,000 mg, Oral, Q12H, Tylene Fantasia, PA-C, 1,000 mg at 09/13/19 1436 .  clarithromycin (BIAXIN) tablet 500 mg, 500 mg, Oral, Q12H, Tylene Fantasia, PA-C, 500 mg at 09/13/19 1436 .  feeding supplement (ENSURE ENLIVE) (ENSURE ENLIVE) liquid 237 mL, 237 mL, Oral, TID BM, Sharen Hones, MD .  finasteride (PROSCAR) tablet 5 mg, 5 mg, Oral, Daily, Ivor Costa, MD, 5 mg at 09/13/19 1436 .  iron sucrose (VENOFER) 300 mg in sodium chloride 0.9 % 250 mL IVPB, 300 mg, Intravenous, Once, Sharen Hones, MD .  multivitamin with minerals tablet 1 tablet, 1 tablet, Oral, Daily, Fritzi Mandes, MD, 1 tablet at 09/13/19 1435 .  ondansetron (ZOFRAN) injection 4 mg, 4 mg, Intravenous, Q8H PRN, Ivor Costa, MD .  pantoprazole (PROTONIX) EC tablet 40 mg, 40 mg, Oral, BID AC, Fritzi Mandes, MD, 40 mg at 09/13/19 1436 .  tamsulosin (FLOMAX) capsule 0.4 mg, 0.4 mg, Oral, Daily, Ivor Costa, MD, 0.4 mg at  09/13/19 1435 .  vitamin B-12 (CYANOCOBALAMIN) tablet 1,000 mcg, 1,000 mcg, Oral, Daily, Fritzi Mandes, MD, 1,000 mcg at 09/13/19 1437   Physical exam:  Vitals:   09/13/19 1220 09/13/19 1230 09/13/19 1240 09/13/19 1300  BP: (!) 89/45 110/68 (!) 113/56 (!) 157/63  Pulse: 67 72 63 97  Resp: 19 (!) 22 (!) 29 20  Temp: (!) 97.1 F (36.2 C)   97.8 F (36.6 C)  TempSrc: Temporal   Oral  SpO2: 95% 95% 95% 95%  Weight:      Height:       Physical Exam Constitutional:      General: He is not in acute distress.    Appearance: He is not diaphoretic.  HENT:     Head: Normocephalic and atraumatic.     Nose: Nose normal.     Mouth/Throat:     Pharynx: No oropharyngeal exudate.  Eyes:     General: No scleral icterus.    Pupils: Pupils are equal,  round, and reactive to light.  Cardiovascular:     Rate and Rhythm: Normal rate and regular rhythm.     Heart sounds: No murmur heard.   Pulmonary:     Effort: Pulmonary effort is normal. No respiratory distress.     Breath sounds: No rales.  Chest:     Chest wall: No tenderness.  Abdominal:     General: There is no distension.     Palpations: Abdomen is soft.     Tenderness: There is no abdominal tenderness.  Musculoskeletal:        General: Normal range of motion.     Cervical back: Normal range of motion and neck supple.  Skin:    General: Skin is warm and dry.     Findings: No erythema.  Neurological:     Mental Status: He is alert and oriented to person, place, and time. Mental status is at baseline.     Cranial Nerves: No cranial nerve deficit.     Motor: No abnormal muscle tone.     Coordination: Coordination normal.  Psychiatric:        Mood and Affect: Affect normal.         CMP Latest Ref Rng & Units 09/13/2019  Glucose 70 - 99 mg/dL 86  BUN 8 - 23 mg/dL 8  Creatinine 0.61 - 1.24 mg/dL 1.43(H)  Sodium 135 - 145 mmol/L 140  Potassium 3.5 - 5.1 mmol/L 4.0  Chloride 98 - 111 mmol/L 107  CO2 22 - 32 mmol/L 25  Calcium 8.9 - 10.3 mg/dL 8.7(L)  Total Protein 6.5 - 8.1 g/dL -  Total Bilirubin 0.3 - 1.2 mg/dL -  Alkaline Phos 38 - 126 U/L -  AST 15 - 41 U/L -  ALT 0 - 44 U/L -   CBC Latest Ref Rng & Units 09/13/2019  WBC 4.0 - 10.5 K/uL 8.1  Hemoglobin 13.0 - 17.0 g/dL 8.9(L)  Hematocrit 39 - 52 % 29.8(L)  Platelets 150 - 400 K/uL 256    RADIOGRAPHIC STUDIES: I have personally reviewed the radiological images as listed and agreed with the findings in the report. No results found.  Assessment and plan-   #Rectosigmoid adenocarcinoma, partially obstructed. 09/11/2019, biopsy showed invasive colorectal adenocarcinoma.  Moderately differentiated. Patient has been seen by surgery and Dr. Dahlia Byes has ordered CT chest abdomen pelvis for staging.  Results are  pending at this point. Colonoscopy findings were discussed with patient as well as his son [name] at the bedside. Findings  are concerning for colorectal cancer. I discussed with Dr. Dahlia Byes and I agree that patient will need an MRI pelvis rectal cancer protocol for further staging and hopefully able to further clarify if this mass arises from distant sigmoid colon or upper rectum. Management plan can be different regarding to sigmoid colon cancer versus rectal cancer. Plan was discussed with patient and his son.  CEA is pending.  #Iron deficiency anemia, patient is status post PRBC transfusion.  Currently hemoglobin is 8.9.  Patient has been ordered a dose of IV Venofer treatment. Monitor hemoglobin.  #Vitamin B12 deficiency, patient started on vitamin B12 injection as well as oral supplementation. #  H. pylori associated gastritis.  Patient has been started on H. pylori treatment   Thank you for allowing me to participate in the care of this patient.  Plan was discussed with Dr. Roosevelt Locks and Dr.Pabon Total face to face encounter time for this patient visit was 70 min. >50% of the time was  spent in counseling and coordination of care.    [name], MD, PhD Hematology Oncology [facility] Pager- [phone] 09/13/2019

## 2019-09-13 NOTE — Transfer of Care (Signed)
Immediate Anesthesia Transfer of Care Note  Patient: Christopher Delacruz  Procedure(s) Performed: COLONOSCOPY WITH PROPOFOL (N/A )  Patient Location: PACU and Endoscopy Unit  Anesthesia Type:General  Level of Consciousness: drowsy  Airway & Oxygen Therapy: Patient Spontanous Breathing and Patient connected to nasal cannula oxygen  Post-op Assessment: Report given to RN and Post -op Vital signs reviewed and stable  Post vital signs: Reviewed and stable  Last Vitals:  Vitals Value Taken Time  BP 89/49 09/13/19 1222  Temp 36.2 C 09/13/19 1220  Pulse 65 09/13/19 1223  Resp 31 09/13/19 1223  SpO2 94 % 09/13/19 1223  Vitals shown include unvalidated device data.  Last Pain:  Vitals:   09/13/19 1220  TempSrc: Temporal  PainSc: Asleep         Complications: No complications documented.

## 2019-09-13 NOTE — TOC Initial Note (Signed)
Transition of Care [facility]) - Initial/Assessment Note    Patient Details  Name: Christopher Delacruz MRN: [id number] Date of Birth: July 28, 1950  [facility]) CM/SW Contact:    Beverly Sessions, RN Phone Number: 09/13/2019, 12:54 PM  Clinical Narrative:                  Went to patient's room to discuss home health. Patient currently off the floor  Expected Discharge Plan: Home/Self Care Barriers to Discharge: No Barriers Identified   Patient Goals and CMS Choice        Expected Discharge Plan and Services Expected Discharge Plan: Home/Self Care In-house Referral: Clinical Social Work     Living arrangements for the past 2 months: Single Family Home (duplex)                                      Prior Living Arrangements/Services Living arrangements for the past 2 months: Single Family Home (duplex) Lives with:: Self Patient language and need for interpreter reviewed:: No Do you feel safe going back to the place where you live?: Yes      Need for Family Participation in Patient Care: Yes (Comment) Care giver support system in place?: Yes (comment)   Criminal Activity/Legal Involvement Pertinent to Current Situation/Hospitalization: No - Comment as needed  Activities of Daily Living Home Assistive Devices/Equipment: None ADL Screening (condition at time of admission) Patient's cognitive ability adequate to safely complete daily activities?: Yes Is the patient deaf or have difficulty hearing?: No Does the patient have difficulty seeing, even when wearing glasses/contacts?: No Does the patient have difficulty concentrating, remembering, or making decisions?: Yes Patient able to express need for assistance with ADLs?: No Does the patient have difficulty dressing or bathing?: No Independently performs ADLs?: Yes (appropriate for developmental age) Does the patient have difficulty walking or climbing stairs?: Yes Weakness of Legs: Right Weakness of Arms/Hands:  None  Permission Sought/Granted   Permission granted to share information with : Yes, Verbal Permission Granted        Permission granted to share info w Relationship: Marshawn Normoyle  Permission granted to share info w Contact Information: [phone]  Emotional Assessment         Alcohol / Substance Use: Not Applicable Psych Involvement: No (comment)  Admission diagnosis:  GIB (gastrointestinal bleeding) [K92.2] Upper GI bleed [K92.2] Gastrointestinal hemorrhage, unspecified gastrointestinal hemorrhage type [K92.2] Patient Active Problem List   Diagnosis Date Noted  . Intestinal neoplasm   . BPH (benign prostatic hyperplasia) 09/07/2019  . Acute renal failure superimposed on stage 3a chronic kidney disease ([place]) 09/07/2019  . Microcytic anemia 09/07/2019  . GIB (gastrointestinal bleeding) 09/07/2019  . Anemia due to blood loss 09/07/2019  . CVA (cerebral vascular accident) ([place]) 03/22/2018  . Acute CVA (cerebrovascular accident) ([place]) 03/22/2018  . HLD (hyperlipidemia) 03/21/2018  . Right sided weakness 06/26/2015  . Dysphagia 06/26/2015  . Essential hypertension, malignant 06/26/2015  . Acute renal insufficiency 06/26/2015  . Hypokalemia 06/26/2015  . Cerebral infarction ([place]) 06/23/2015  . Diabetes ([place]) 06/23/2015  . HTN (hypertension) 06/23/2015   PCP:  Donnie Coffin, MD Pharmacy:   [facility] [place], [place] [address] [place] [address] Phone: [phone] Fax: [phone]  [facility] [address]), [place] - [address] [place] ([place]) [place] [address]  Phone: [phone] Fax: [phone]     Social Determinants of Health (SDOH) Interventions    Readmission Risk Interventions No flowsheet data found.

## 2019-09-13 NOTE — Progress Notes (Signed)
PROGRESS NOTE    Christopher Delacruz  SXJ:[id number] DOB: 11/13/1950 DOA: 09/07/2019 PCP: Donnie Coffin, MD   Chief complaint.  Rectal bleeding.  Brief Narrative:  Christopher Delacruz a 69 y.o.malewith medical history significant ofstroke withmildright-sided weakness onPlavix, hypertension, hyperlipidemia, diet-controlled diabetes, upper GI bleeding, BPH, CKD stage III, who presents with rectal incontinenceand bloody diarrhea.  Patient received 2 units of PRBC. Hemoglobin has been stable. GI consult is obtained, scheduled colonoscopy on 8/15.  8/15.EGD showed some erosion in the duodenum. Colonoscopy was a poor prep, suspect colon mass. Repeat colonoscopy is scheduled for tomorrow.  8/16.  Poor preparation for colonoscopy today, colonoscopy will be redone tomorrow  8/17.  Colonoscopy performed the third time, tumor marked for surgery.  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  Surgical consult obtained, work-up started, surgery planned for Friday.   Assessment & Plan:   Principal Problem:   GIB (gastrointestinal bleeding) Active Problems:   HTN (hypertension)   HLD (hyperlipidemia)   CVA (cerebral vascular accident) ([place])   BPH (benign prostatic hyperplasia)   Acute renal failure superimposed on stage 3a chronic kidney disease ([facility])   Microcytic anemia   Anemia due to blood loss   Intestinal neoplasm  #1.  Acute blood loss anemia secondary to rectal bleeding. Iron deficient anemia and a B12 deficient anemia. Hemoglobin has been stable, no additional bleeding.  Patient also has significant iron deficiency, will give IV iron.  Patient also has B12 deficiency, will give injection of B12.  2.  Rectal bleeding secondary to colon cancer. Surgical consult obtained.  Work-up for staging started.  Planning for surgery on Friday.  3.  H. pylori. Started treatment per surgery.  4.  Essential hypertension.  Continue home medicines.  5.  Acute kidney  injury superimposed on stage IIIa chronic kidney disease. Renal function has improved.   DVT prophylaxis:SCDs Code Status:Full Family Communication:None Disposition Plan:  Patient came from:Home  Anticipated d/c place:Home  Barriers to d/c OR conditions which need to be met to effect a safe d/c:   Consultants:  GI.  General surgery.  Procedures:None Antimicrobials:None    Subjective: Patient doing well today.  No additional rectal bleeding. Denies any short of breath or cough. No fever chills. No dysuria hematuria.  Objective: Vitals:   09/13/19 1220 09/13/19 1230 09/13/19 1240 09/13/19 1300  BP: (!) 89/45 110/68 (!) 113/56 (!) 157/63  Pulse: 67 72 63 97  Resp: 19 (!) 22 (!) 29 20  Temp: (!) 97.1 F (36.2 C)   97.8 F (36.6 C)  TempSrc: Temporal   Oral  SpO2: 95% 95% 95% 95%  Weight:      Height:        Intake/Output Summary (Last 24 hours) at 09/13/2019 1420 Last data filed at 09/13/2019 1030 Gross per 24 hour  Intake 960 ml  Output 2050 ml  Net -1090 ml   Filed Weights   09/08/19 1107 09/11/19 1450 09/13/19 1126  Weight: 102.4 kg 102.4 kg 102.4 kg    Examination:  General exam: Appears calm and comfortable  Respiratory system: Clear to auscultation. Respiratory effort normal. Cardiovascular system: S1 & S2 heard, RRR. No JVD, murmurs, rubs, gallops or clicks. No pedal edema. Gastrointestinal system: Abdomen is nondistended, soft and nontender. No organomegaly or masses felt. Normal bowel sounds heard. Central nervous system: Alert and oriented. No focal neurological deficits. Extremities: Symmetric  Skin: No rashes, lesions or ulcers Psychiatry: Judgement and insight appear normal. Mood & affect appropriate.  Data Reviewed: I have personally reviewed following labs and imaging studies  CBC: Recent Labs  Lab  09/06/19 1935 09/07/19 0722 09/08/19 1855 09/09/19 0654 09/11/19 0936 09/12/19 0747 09/13/19 0535  WBC 8.9   < > 9.5 9.9 10.1 8.5 8.1  NEUTROABS 5.4  --   --   --  7.1 5.6 5.4  HGB 6.7*   < > 9.1* 9.0* 9.0* 8.9* 8.9*  HCT 23.4*   < > 28.5* 28.3* 30.1* 29.4* 29.8*  MCV 73.6*   < > 72.0* 72.6* 74.3* 74.6* 75.3*  PLT 299   < > 289 273 254 211 256   < > = values in this interval not displayed.   Basic Metabolic Panel: Recent Labs  Lab 09/06/19 1935 09/08/19 0127 09/11/19 0936 09/12/19 0747 09/13/19 0535  NA 142 144 143 140 140  K 4.0 3.6 4.2 4.4 4.0  CL 109 113* 110 107 107  CO2 26 25 27 22 25   GLUCOSE 99 79 86 88 86  BUN 25* 17 12 10 8   CREATININE 2.35* 1.43* 1.36* 1.63* 1.43*  CALCIUM 8.5* 8.6* 8.8* 8.9 8.7*  MG  --   --  1.9 2.1 2.1   GFR: Estimated Creatinine Clearance: 59.5 mL/min (A) (by C-G formula based on SCr of 1.43 mg/dL (H)). Liver Function Tests: Recent Labs  Lab 09/06/19 1935  AST 16  ALT 10  ALKPHOS 65  BILITOT 0.8  PROT 6.6  ALBUMIN 3.2*   Recent Labs  Lab 09/06/19 1935  LIPASE 69*   No results for input(s): AMMONIA in the last 168 hours. Coagulation Profile: Recent Labs  Lab 09/07/19 1430  INR 1.0   Cardiac Enzymes: No results for input(s): CKTOTAL, CKMB, CKMBINDEX, TROPONINI in the last 168 hours. BNP (last 3 results) No results for input(s): PROBNP in the last 8760 hours. HbA1C: No results for input(s): HGBA1C in the last 72 hours. CBG: Recent Labs  Lab 09/13/19 1120  GLUCAP 83   Lipid Profile: No results for input(s): CHOL, HDL, LDLCALC, TRIG, CHOLHDL, LDLDIRECT in the last 72 hours. Thyroid Function Tests: No results for input(s): TSH, T4TOTAL, FREET4, T3FREE, THYROIDAB in the last 72 hours. Anemia Panel: No results for input(s): VITAMINB12, FOLATE, FERRITIN, TIBC, IRON, RETICCTPCT in the last 72 hours. Sepsis Labs: No results for input(s): PROCALCITON, LATICACIDVEN in the last 168 hours.  Recent Results (from the past  240 hour(s))  SARS Coronavirus 2 by RT PCR (hospital order, performed in [facility] hospital lab) Nasopharyngeal Nasopharyngeal Swab     Status: None   Collection Time: 09/07/19  7:22 AM   Specimen: Nasopharyngeal Swab  Result Value Ref Range Status   SARS Coronavirus 2 NEGATIVE NEGATIVE Final    Comment: (NOTE) SARS-CoV-2 target nucleic acids are NOT DETECTED.  The SARS-CoV-2 RNA is generally detectable in upper and lower respiratory specimens during the acute phase of infection. The lowest concentration of SARS-CoV-2 viral copies this assay can detect is 250 copies / mL. A negative result does not preclude SARS-CoV-2 infection and should not be used as the sole basis for treatment or other patient management decisions.  A negative result may occur with improper specimen collection / handling, submission of specimen other than nasopharyngeal swab, presence of viral mutation(s) within the areas targeted by this assay, and inadequate number of viral copies (<250 copies / mL). A negative result must be combined with clinical observations, patient history, and epidemiological information.  Fact Sheet for Patients:   StrictlyIdeas.no  Fact Sheet for Healthcare  Providers: BankingDealers.co.za  This test is not yet approved or  cleared by the [place] and has been authorized for detection and/or diagnosis of SARS-CoV-2 by FDA under an Emergency Use Authorization (EUA).  This EUA will remain in effect (meaning this test can be used) for the duration of the COVID-19 declaration under Section 564(b)(1) of the Act, 21 U.S.C. section 360bbb-3(b)(1), unless the authorization is terminated or revoked sooner.  Performed at [facility], [address] [place], [place] [address]   Gastrointestinal Panel by PCR , Stool     Status: None   Collection Time: 09/07/19  3:58 PM   Specimen: Stool  Result Value Ref Range Status    Campylobacter species NOT DETECTED NOT DETECTED Final   Plesimonas shigelloides NOT DETECTED NOT DETECTED Final   Salmonella species NOT DETECTED NOT DETECTED Final   Yersinia enterocolitica NOT DETECTED NOT DETECTED Final   Vibrio species NOT DETECTED NOT DETECTED Final   Vibrio cholerae NOT DETECTED NOT DETECTED Final   Enteroaggregative E coli (EAEC) NOT DETECTED NOT DETECTED Final   Enteropathogenic E coli (EPEC) NOT DETECTED NOT DETECTED Final   Enterotoxigenic E coli (ETEC) NOT DETECTED NOT DETECTED Final   Shiga like toxin producing E coli (STEC) NOT DETECTED NOT DETECTED Final   Shigella/Enteroinvasive E coli (EIEC) NOT DETECTED NOT DETECTED Final   Cryptosporidium NOT DETECTED NOT DETECTED Final   Cyclospora cayetanensis NOT DETECTED NOT DETECTED Final   Entamoeba histolytica NOT DETECTED NOT DETECTED Final   Giardia lamblia NOT DETECTED NOT DETECTED Final   Adenovirus F40/41 NOT DETECTED NOT DETECTED Final   Astrovirus NOT DETECTED NOT DETECTED Final   Norovirus GI/GII NOT DETECTED NOT DETECTED Final   Rotavirus A NOT DETECTED NOT DETECTED Final   Sapovirus (I, II, IV, and V) NOT DETECTED NOT DETECTED Final    Comment: Performed at [facility], [address] [place], [place] [address]         Radiology Studies: No results found.      Scheduled Meds: . amLODipine  10 mg Oral Daily  . amoxicillin  1,000 mg Oral Q12H  . clarithromycin  500 mg Oral Q12H  . feeding supplement  1 Container Oral TID BM  . finasteride  5 mg Oral Daily  . iohexol  500 mL Oral Q1 Hr x 2  . multivitamin with minerals  1 tablet Oral Daily  . pantoprazole  40 mg Oral BID AC  . tamsulosin  0.4 mg Oral Daily  . vitamin B-12  1,000 mcg Oral Daily   Continuous Infusions: . sodium chloride Stopped (09/11/19 1355)  . lactated ringers 75 mL/hr at 09/13/19 0200     LOS: 6 days    Time spent: 27 minutes    Sharen Hones, MD [facility]   To contact the attending  provider between 7A-7P or the covering provider during after hours 7P-7A, please log into the web site [email or web address] and access using universal [place] password for that web site. If you do not have the password, please call the hospital operator.  09/13/2019, 2:20 PM

## 2019-09-13 NOTE — Progress Notes (Signed)
Chaplain offered Advanced Directive education to [place] and his nephew [name]. Papers left at beside.     09/13/19 1800  Clinical Encounter Type  Visited With Patient and family together  Visit Type Initial  Spiritual Encounters  Spiritual Needs Other (Comment)

## 2019-09-14 ENCOUNTER — Encounter: Payer: Self-pay | Admitting: Gastroenterology

## 2019-09-14 ENCOUNTER — Inpatient Hospital Stay: Payer: Medicare Other

## 2019-09-14 DIAGNOSIS — C20 Malignant neoplasm of rectum: Secondary | ICD-10-CM

## 2019-09-14 DIAGNOSIS — D500 Iron deficiency anemia secondary to blood loss (chronic): Secondary | ICD-10-CM | POA: Diagnosis not present

## 2019-09-14 LAB — SURGICAL PATHOLOGY

## 2019-09-14 LAB — CEA: CEA: 51.5 ng/mL — ABNORMAL HIGH (ref 0.0–4.7)

## 2019-09-14 MED ORDER — CHLORHEXIDINE GLUCONATE CLOTH 2 % EX PADS
6.0000 | MEDICATED_PAD | Freq: Once | CUTANEOUS | Status: AC
  Administered 2019-09-15: 6 via TOPICAL

## 2019-09-14 MED ORDER — CEFAZOLIN SODIUM-DEXTROSE 2-4 GM/100ML-% IV SOLN
2.0000 g | INTRAVENOUS | Status: DC
  Filled 2019-09-14: qty 100

## 2019-09-14 MED ORDER — SODIUM CHLORIDE 0.9 % IV SOLN
200.0000 mg | Freq: Once | INTRAVENOUS | Status: AC
  Administered 2019-09-15: 200 mg via INTRAVENOUS
  Filled 2019-09-14 (×2): qty 10

## 2019-09-14 NOTE — Progress Notes (Signed)
[place] visited pt. after getting a call from [employer] re: AD paperwork left by [place] yesterday; [name] requests chaplain assistance tomorrow to get document notarized.  [place] visited pt. to assess whether pt. needs any hep w/form --> pt. lying in bed w/niece Sandy at bedside.  Document not yet started.  CH explained HCPOA and Living Will to pt. and Sandy; Pt. requests [name] be primary MPOA and [employer]. MPOA; when [place] explained Living Will pt. expressed that he does not want to be on life support for a long time, but he was unsure of his answers when [facility] asked about the three scenarios for prolonging life support on AD.  Lovey Newcomer shared she and pt. have been close ever since she was young because pt. lived w/her and her mother, becoming like an older brother to her.  Pt.'s dinner came mid visit.  [place] will make referral or follow up in person tomorrow to further assist pt. and get document finalized.

## 2019-09-14 NOTE — Progress Notes (Signed)
PROGRESS NOTE    Christopher Delacruz  JKK:[id number] DOB: Nov 11, 1950 DOA: 09/07/2019 PCP: Donnie Coffin, MD   Chief complaint.  Rectal bleeding.  Brief Narrative:  Christopher Delacruz a 69 y.o.malewith medical history significant ofstroke withmildright-sided weakness onPlavix, hypertension, hyperlipidemia, diet-controlled diabetes, upper GI bleeding, BPH, CKD stage III, who presents with rectal incontinenceand bloody diarrhea.  Patient received 2 units of PRBC. Hemoglobin has been stable. GI consult is obtained, scheduled colonoscopy on 8/15.  8/15.EGD showed some erosion in the duodenum. Colonoscopy was a poor prep, suspect colon mass. Repeat colonoscopy is scheduled for tomorrow.  8/16.  Poor preparation for colonoscopy today, colonoscopy will be redone tomorrow  8/17.  Colonoscopy performed the third time, tumor marked for surgery.  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  Surgical consult obtained, work-up started, surgery planned for Friday.  8/18 patient was seen and examined, hemodynamically stable, planning for surgery this Friday  Assessment & Plan:   Principal Problem:   GIB (gastrointestinal bleeding) Active Problems:   HTN (hypertension)   HLD (hyperlipidemia)   CVA (cerebral vascular accident) ([place])   BPH (benign prostatic hyperplasia)   Acute renal failure superimposed on stage 3a chronic kidney disease (HCC)   Absolute anemia   Iron deficiency anemia due to chronic blood loss   Intestinal neoplasm   Colorectal cancer ([place])   Helicobacter pylori gastritis  Acute blood loss anemia secondary to rectal bleeding. Iron deficient anemia and a B12 deficient anemia. Remained stable -Monitoring H&H, stable Hemoglobin has been stable, no additional bleeding.  Patient also has significant iron deficiency, was treated with IV iron.   Anticipating initiating oral supplement Patient also has B12 deficiency, will give injection of  B12.  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 Surgical consult obtained.   Planning for surgery on Friday.  H. pylori. Started treatment per surgery.  Essential hypertension.  Continue home medicines.  Acute kidney injury superimposed on stage IIIa chronic kidney disease. Renal function has improved. -Monitoring closely   DVT prophylaxis:SCDs Code Status:Full Family Communication:None Disposition Plan:  Patient came from:Home  Anticipated d/c place:Home  Barriers to d/c OR conditions which need to be met to effect a safe d/c:   Consultants:  GI.  General surgery.  Procedures:None Antimicrobials:None    Subjective: Patient doing well today.  No additional rectal bleeding. Denies any short of breath or cough. No fever chills. No dysuria hematuria.  Objective: Vitals:   09/13/19 1300 09/13/19 1941 09/13/19 2130 09/14/19 0431  BP: (!) 157/63 (!) 96/54 (!) 115/58 132/71  Pulse: 97 89  74  Resp: 20 18  (!) 22  Temp: 97.8 F (36.6 C) 98.6 F (37 C)  98.5 F (36.9 C)  TempSrc: Oral Oral  Oral  SpO2: 95% 97%  92%  Weight:      Height:        Intake/Output Summary (Last 24 hours) at 09/14/2019 0956 Last data filed at 09/14/2019 0000 Gross per 24 hour  Intake --  Output 1850 ml  Net -1850 ml   Filed Weights   09/08/19 1107 09/11/19 1450 09/13/19 1126  Weight: 102.4 kg 102.4 kg 102.4 kg    Examination:    Physical Exam:   General:  Alert, oriented, cooperative, no distress;   HEENT:  Normocephalic, PERRL, otherwise with in Normal limits   Neuro:  CNII-XII intact. , normal motor and sensation, reflexes intact   Lungs:   Clear to auscultation BL, Respirations unlabored, no wheezes / crackles  Cardio:  S1/S2, RRR, No murmure, No Rubs or Gallops   Abdomen:   Soft, non-tender, bowel sounds active  all four quadrants,  no guarding or peritoneal signs.  Muscular skeletal:  Limited exam - in bed, able to move all 4 extremities, Normal strength,  2+ pulses,  symmetric, No pitting edema  Skin:  Dry, warm to touch, negative for any Rashes, No open wounds  Wounds: Please see nursing documentation          Data Reviewed: I have personally reviewed following labs and imaging studies  CBC: Recent Labs  Lab 09/08/19 1855 09/09/19 0654 09/11/19 0936 09/12/19 0747 09/13/19 0535  WBC 9.5 9.9 10.1 8.5 8.1  NEUTROABS  --   --  7.1 5.6 5.4  HGB 9.1* 9.0* 9.0* 8.9* 8.9*  HCT 28.5* 28.3* 30.1* 29.4* 29.8*  MCV 72.0* 72.6* 74.3* 74.6* 75.3*  PLT 289 273 254 211 812   Basic Metabolic Panel: Recent Labs  Lab 09/08/19 0127 09/11/19 0936 09/12/19 0747 09/13/19 0535  NA 144 143 140 140  K 3.6 4.2 4.4 4.0  CL 113* 110 107 107  CO2 25 27 22 25   GLUCOSE 79 86 88 86  BUN 17 12 10 8   CREATININE 1.43* 1.36* 1.63* 1.43*  CALCIUM 8.6* 8.8* 8.9 8.7*  MG  --  1.9 2.1 2.1   GFR: Estimated Creatinine Clearance: 59.5 mL/min (A) (by C-G formula based on SCr of 1.43 mg/dL (H)). Liver Function Tests: No results for input(s): AST, ALT, ALKPHOS, BILITOT, PROT, ALBUMIN in the last 168 hours. No results for input(s): LIPASE, AMYLASE in the last 168 hours. No results for input(s): AMMONIA in the last 168 hours. Coagulation Profile: Recent Labs  Lab 09/07/19 1430  INR 1.0   Cardiac Enzymes: No results for input(s): CKTOTAL, CKMB, CKMBINDEX, TROPONINI in the last 168 hours. BNP (last 3 results) No results for input(s): PROBNP in the last 8760 hours. HbA1C: No results for input(s): HGBA1C in the last 72 hours. CBG: Recent Labs  Lab 09/13/19 1120  GLUCAP 83   Lipid Profile: No results for input(s): CHOL, HDL, LDLCALC, TRIG, CHOLHDL, LDLDIRECT in the last 72 hours. Thyroid Function Tests: No results for input(s): TSH, T4TOTAL, FREET4, T3FREE, THYROIDAB in the last 72 hours. Anemia  Panel: No results for input(s): VITAMINB12, FOLATE, FERRITIN, TIBC, IRON, RETICCTPCT in the last 72 hours. Sepsis Labs: No results for input(s): PROCALCITON, LATICACIDVEN in the last 168 hours.  Recent Results (from the past 240 hour(s))  SARS Coronavirus 2 by RT PCR (hospital order, performed in [facility] hospital lab) Nasopharyngeal Nasopharyngeal Swab     Status: None   Collection Time: 09/07/19  7:22 AM   Specimen: Nasopharyngeal Swab  Result Value Ref Range Status   SARS Coronavirus 2 NEGATIVE NEGATIVE Final    Comment: (NOTE) SARS-CoV-2 target nucleic acids are NOT DETECTED.  The SARS-CoV-2 RNA is generally detectable in upper and lower respiratory specimens during the acute phase of infection. The lowest concentration of SARS-CoV-2 viral copies this assay can detect is 250 copies / mL. A negative result does not preclude SARS-CoV-2 infection and should not be used as the sole basis for treatment or other patient management decisions.  A negative result may occur with improper specimen collection / handling, submission of specimen other than nasopharyngeal swab, presence of viral mutation(s) within the areas targeted by this assay, and inadequate number of viral copies (<250 copies / mL). A negative result must be combined with clinical observations, patient history, and epidemiological information.  Fact Sheet for Patients:   StrictlyIdeas.no  Fact Sheet for Healthcare Providers: BankingDealers.co.za  This test is not yet approved or  cleared by the [place] FDA and has been authorized for detection and/or diagnosis of SARS-CoV-2 by FDA under an Emergency Use Authorization (EUA).  This EUA will remain in effect (meaning this test can be used) for the duration of the COVID-19 declaration under Section 564(b)(1) of the Act, 21 U.S.C. section 360bbb-3(b)(1), unless the authorization is terminated or revoked  sooner.  Performed at [facility], [address] [place], [place] [address]   Gastrointestinal Panel by PCR , Stool     Status: None   Collection Time: 09/07/19  3:58 PM   Specimen: Stool  Result Value Ref Range Status   Campylobacter species NOT DETECTED NOT DETECTED Final   Plesimonas shigelloides NOT DETECTED NOT DETECTED Final   Salmonella species NOT DETECTED NOT DETECTED Final   Yersinia enterocolitica NOT DETECTED NOT DETECTED Final   Vibrio species NOT DETECTED NOT DETECTED Final   Vibrio cholerae NOT DETECTED NOT DETECTED Final   Enteroaggregative E coli (EAEC) NOT DETECTED NOT DETECTED Final   Enteropathogenic E coli (EPEC) NOT DETECTED NOT DETECTED Final   Enterotoxigenic E coli (ETEC) NOT DETECTED NOT DETECTED Final   Shiga like toxin producing E coli (STEC) NOT DETECTED NOT DETECTED Final   Shigella/Enteroinvasive E coli (EIEC) NOT DETECTED NOT DETECTED Final   Cryptosporidium NOT DETECTED NOT DETECTED Final   Cyclospora cayetanensis NOT DETECTED NOT DETECTED Final   Entamoeba histolytica NOT DETECTED NOT DETECTED Final   Giardia lamblia NOT DETECTED NOT DETECTED Final   Adenovirus F40/41 NOT DETECTED NOT DETECTED Final   Astrovirus NOT DETECTED NOT DETECTED Final   Norovirus GI/GII NOT DETECTED NOT DETECTED Final   Rotavirus A NOT DETECTED NOT DETECTED Final   Sapovirus (I, II, IV, and V) NOT DETECTED NOT DETECTED Final    Comment: Performed at [facility], [address], [place],  [address]         Radiology Studies: CT CHEST W CONTRAST  Result Date: 09/13/2019 CLINICAL DATA:  69 year old male with history of colorectal cancer. Staging examination. EXAM: CT CHEST, ABDOMEN, AND PELVIS WITH CONTRAST TECHNIQUE: Multidetector CT imaging of the chest, abdomen and pelvis was performed following the standard protocol during bolus administration of intravenous contrast. CONTRAST:  136m OMNIPAQUE IOHEXOL 300 MG/ML  SOLN COMPARISON:  No  priors. FINDINGS: CT CHEST FINDINGS Cardiovascular: Heart size is normal. There is no significant pericardial fluid, thickening or pericardial calcification. There is aortic atherosclerosis, as well as atherosclerosis of the great vessels of the mediastinum and the coronary arteries, including calcified atherosclerotic plaque in the ramus intermedius coronary artery. Mediastinum/Nodes: No pathologically enlarged mediastinal or hilar lymph nodes. Esophagus is unremarkable in appearance. No axillary lymphadenopathy. Lungs/Pleura: Dependent areas of subsegmental atelectasis are noted in the lower lobes of the lungs bilaterally. No acute consolidative airspace disease. No pleural effusions. A few scattered tiny 2-3 mm pulmonary nodules are noted in the lungs bilaterally, nonspecific. No larger more suspicious appearing pulmonary nodules or masses are noted. Musculoskeletal: There are no aggressive appearing lytic or blastic lesions noted in the visualized portions of the skeleton. CT ABDOMEN PELVIS FINDINGS Hepatobiliary: No suspicious cystic or solid hepatic lesions. No intra or extrahepatic biliary ductal dilatation. Gallbladder is normal in appearance. Pancreas: No pancreatic mass. No pancreatic ductal dilatation. No pancreatic or peripancreatic fluid collections or inflammatory changes. Spleen: Unremarkable. Adrenals/Urinary Tract: Tiny subcentimeter low-attenuation lesions in the  right kidney, too small to definitively characterize, but statistically likely to represent tiny cysts. Left kidney and bilateral adrenal glands are normal in appearance. No hydroureteronephrosis. Urinary bladder is normal in appearance. Stomach/Bowel: Normal appearance of the stomach. No pathologic dilatation of small bowel or colon. Masslike irregular mural thickening of the rectum which is difficult to discretely measure given its irregular appearance, but is best demonstrated on axial image 116 of series 504, presumably a primary  rectal neoplasm. Notably, this obliterates the fat plane in the mesorectal fat between the lesion and the right seminal vesicle (best appreciated on axial image 116 of series 504). The appendix is not confidently identified and may be surgically absent. Regardless, there are no inflammatory changes noted adjacent to the cecum to suggest the presence of an acute appendicitis at this time. Vascular/Lymphatic: Aortic atherosclerosis, without evidence of aneurysm or dissection in the abdominal or pelvic vasculature. Several prominent but nonenlarged mesorectal lymph nodes are noted measuring up to 7 mm in short axis. No other lymphadenopathy noted in the abdomen or pelvis. Reproductive: There is some obscuration of the fat plane between the right seminal vesicle and the rectal mass (discussed above). Prostate gland and seminal vesicles are otherwise unremarkable in appearance. Other: No significant volume of ascites.  No pneumoperitoneum. Musculoskeletal: There are no aggressive appearing lytic or blastic lesions noted in the visualized portions of the skeleton. IMPRESSION: 1. Rectal mass with evidence of local invasion into the mesorectal fat and potential involvement of the right seminal vesicle. Multiple borderline enlarged mesorectal lymph nodes. No other definite evidence of distal metastatic disease elsewhere in the abdomen or pelvis. 2. Small pulmonary nodules measuring 2-3 mm in the lungs bilaterally, highly nonspecific and statistically likely benign. However, close attention on follow-up studies is recommended to ensure their stability as metastatic disease is not entirely excluded. 3. Aortic atherosclerosis, in addition to single-vessel coronary artery disease. Assessment for potential risk factor modification, dietary therapy or pharmacologic therapy may be warranted, if clinically indicated. 4. Additional incidental findings, as above. Electronically Signed   By: Vinnie Langton M.D.   On: 09/13/2019 19:56    MR PELVIS WO CONTRAST  Result Date: 09/14/2019 CLINICAL DATA:  Rectal mass with potential local invasion based on prior imaging EXAM: MRI PELVIS WITHOUT CONTRAST TECHNIQUE: Multiplanar multisequence MR imaging of the pelvis was performed. No intravenous contrast was administered. Small amount of [place] gel was administered per rectum to optimize tumor evaluation. COMPARISON:  09/13/2019 FINDINGS: TUMOR LOCATION Tumor distance from Anal Verge/Skin Surface:  13.3 cm Tumor distance to Internal Anal Sphincter: 5.9 cm TUMOR DESCRIPTION Circumferential Extent: Circumferential tumor in the upper margin of the tumor. The tumor favors the RIGHT anterolateral rectum and is eccentric towards the inferior margin. Tumor Length: 8.2 cm T - CATEGORY Extension through Muscularis Propria: Yes, approximately 1.5-1.6 cm beyond the confines of the rectum in the axial plane. At least 6-7 mm beyond in the sagittal. Note that bulging of the anterior rectum with a maintained defined T2 hypointense rim is noted along the RIGHT anterolateral margin. Shortest Distance of any tumor/node from Mesorectal Fascia: 0 mm Extramural Vascular Invasion/Tumor Thrombus: Yes expanded low signal vein arising from an area of extra rectal extension along the LEFT lateral rectal margin (image 20, series 8) also on image 23 of series 9. Expanded vein with low signal also demonstrated on image 31 of series 8 Invasion of Anterior Peritoneal Reflection: Yes Involvement of Adjacent Organs or Pelvic Sidewall: Yes, invasion of the seminal vesicles best  seen on image 21 of series 8, tumor passes through the anterior peritoneal reflection at this level extending to the seminal vesicles greatest on the RIGHT just to the RIGHT of midline. Levator Ani Involvement: No N - CATEGORY Mesorectal Lymph Nodes >=18m: N2 with multiple lymph nodes in the mesorectum extending high along the superior rectal vein and into the nodal chains along the inferior IMV as seen on the prior  CT. Greater than 4 lymph nodes meet this criteria Extra-mesorectal Lymphadenopathy: Small lymph nodes along venous drainage pathways and lymphatic pathways above the mesorectum, no pelvic sidewall adenopathy. Other: Urinary Tract: Urinary bladder is grossly normal. No signs of distal ureteral dilation. Bowel: As above Vascular/Lymphatic: Lymphatic assessment as described. Reproductive: Seminal vesicle involvement as discussed. Other: None Musculoskeletal: Limited assessment of musculoskeletal structures is unremarkable. 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 Rectal adenocarcinoma N stage: N2 with superior rectal and nodes about the sacral promontory and just above, bordering on nodes outside of regional nodal stations. Distance from tumor to the internal anal sphincter is 13.3 cm. Electronically Signed   By: GZetta BillsM.D.   On: 09/14/2019 08:18   CT ABDOMEN PELVIS W CONTRAST  Result Date: 09/13/2019 CLINICAL DATA:  69year old male with history of colorectal cancer. Staging examination. EXAM: CT CHEST, ABDOMEN, AND PELVIS WITH CONTRAST TECHNIQUE: Multidetector CT imaging of the chest, abdomen and pelvis was performed following the standard protocol during bolus administration of intravenous contrast. CONTRAST:  1062mOMNIPAQUE IOHEXOL 300 MG/ML  SOLN COMPARISON:  No priors. FINDINGS: CT CHEST FINDINGS Cardiovascular: Heart size is normal. There is no significant pericardial fluid, thickening or pericardial calcification. There is aortic atherosclerosis, as well as atherosclerosis of the great vessels of the mediastinum and the coronary arteries, including calcified atherosclerotic plaque in the ramus intermedius coronary artery. Mediastinum/Nodes: No pathologically enlarged mediastinal or hilar lymph nodes. Esophagus is unremarkable in appearance. No axillary lymphadenopathy. Lungs/Pleura: Dependent areas of subsegmental atelectasis are noted in the lower lobes of  the lungs bilaterally. No acute consolidative airspace disease. No pleural effusions. A few scattered tiny 2-3 mm pulmonary nodules are noted in the lungs bilaterally, nonspecific. No larger more suspicious appearing pulmonary nodules or masses are noted. Musculoskeletal: There are no aggressive appearing lytic or blastic lesions noted in the visualized portions of the skeleton. CT ABDOMEN PELVIS FINDINGS Hepatobiliary: No suspicious cystic or solid hepatic lesions. No intra or extrahepatic biliary ductal dilatation. Gallbladder is normal in appearance. Pancreas: No pancreatic mass. No pancreatic ductal dilatation. No pancreatic or peripancreatic fluid collections or inflammatory changes. Spleen: Unremarkable. Adrenals/Urinary Tract: Tiny subcentimeter low-attenuation lesions in the right kidney, too small to definitively characterize, but statistically likely to represent tiny cysts. Left kidney and bilateral adrenal glands are normal in appearance. No hydroureteronephrosis. Urinary bladder is normal in appearance. Stomach/Bowel: Normal appearance of the stomach. No pathologic dilatation of small bowel or colon. Masslike irregular mural thickening of the rectum which is difficult to discretely measure given its irregular appearance, but is best demonstrated on axial image 116 of series 504, presumably a primary rectal neoplasm. Notably, this obliterates the fat plane in the mesorectal fat between the lesion and the right seminal vesicle (best appreciated on axial image 116 of series 504). The appendix is not confidently identified and may be surgically absent. Regardless, there are no inflammatory changes noted adjacent to the cecum to suggest the presence of an acute appendicitis at this time. Vascular/Lymphatic: Aortic atherosclerosis, without evidence of aneurysm or dissection in  the abdominal or pelvic vasculature. Several prominent but nonenlarged mesorectal lymph nodes are noted measuring up to 7 mm in short  axis. No other lymphadenopathy noted in the abdomen or pelvis. Reproductive: There is some obscuration of the fat plane between the right seminal vesicle and the rectal mass (discussed above). Prostate gland and seminal vesicles are otherwise unremarkable in appearance. Other: No significant volume of ascites.  No pneumoperitoneum. Musculoskeletal: There are no aggressive appearing lytic or blastic lesions noted in the visualized portions of the skeleton. IMPRESSION: 1. Rectal mass with evidence of local invasion into the mesorectal fat and potential involvement of the right seminal vesicle. Multiple borderline enlarged mesorectal lymph nodes. No other definite evidence of distal metastatic disease elsewhere in the abdomen or pelvis. 2. Small pulmonary nodules measuring 2-3 mm in the lungs bilaterally, highly nonspecific and statistically likely benign. However, close attention on follow-up studies is recommended to ensure their stability as metastatic disease is not entirely excluded. 3. Aortic atherosclerosis, in addition to single-vessel coronary artery disease. Assessment for potential risk factor modification, dietary therapy or pharmacologic therapy may be warranted, if clinically indicated. 4. Additional incidental findings, as above. Electronically Signed   By: Vinnie Langton M.D.   On: 09/13/2019 19:56        Scheduled Meds: . amLODipine  10 mg Oral Daily  . amoxicillin  1,000 mg Oral Q12H  . clarithromycin  500 mg Oral Q12H  . feeding supplement (ENSURE ENLIVE)  237 mL Oral TID BM  . finasteride  5 mg Oral Daily  . multivitamin with minerals  1 tablet Oral Daily  . pantoprazole  40 mg Oral BID AC  . tamsulosin  0.4 mg Oral Daily  . vitamin B-12  1,000 mcg Oral Daily   Continuous Infusions: . sodium chloride Stopped (09/11/19 1355)     LOS: 7 days    Time spent: 27 minutes    Deatra James, MD [facility]   To contact the attending provider between 7A-7P or the  covering provider during after hours 7P-7A, please log into the web site [email or web address] and access using universal [place] password for that web site. If you do not have the password, please call the hospital operator.  09/14/2019, 9:56 AM

## 2019-09-14 NOTE — Progress Notes (Signed)
Mobility Specialist - Progress Note   09/14/19 1537  Mobility  Activity Refused mobility  Mobility performed by Mobility specialist     Pt declined mobility this afternoon. No reason specified. Will attempt session again at a later date/time.    Roda Lauture Mobility Specialist  09/14/19, 3:38 PM

## 2019-09-14 NOTE — Progress Notes (Addendum)
[facility] SURGICAL PROGRESS NOTE (cpt [phone])  Hospital Day(s): 7.   Post op day(s): 1 Day Post-Op.   Interval History: Patient seen and examined, no acute events or new complaints overnight. Patient reports he is feeling fine aside from hunger pain, denies fever, chills, nausea, emesis, abdominal pain. No new labs this morning. CEA is 51.5. He underwent CT Chest/Abdomen/Pelvis as well as MRI of the Pelvis for staging purposes. Oncology (Dr Tasia Catchings) on board to help determine treatment course pending work up. He continues to have bowel function. No more report of melena or hematochezia.   Review of Systems:  Constitutional: denies fever, chills  HEENT: denies cough or congestion  Respiratory: denies any shortness of breath  Cardiovascular: denies chest pain or palpitations  Gastrointestinal: denies abdominal pain, N/V, or diarrhea/and bowel function as per interval history Genitourinary: denies burning with urination or urinary frequency   Vital signs in last 24 hours: [min-max] current  Temp:  [97.1 F (36.2 C)-98.6 F (37 C)] 98.5 F (36.9 C) (08/18 0431) Pulse Rate:  [60-97] 74 (08/18 0431) Resp:  [17-29] 22 (08/18 0431) BP: (89-157)/(45-75) 132/71 (08/18 0431) SpO2:  [92 %-97 %] 92 % (08/18 0431) Weight:  [102.4 kg] 102.4 kg (08/17 1126)     Height: 6' (182.9 cm) Weight: 102.4 kg BMI (Calculated): 30.61   Intake/Output last 2 shifts:  08/17 0701 - 08/18 0700 In: -  Out: 2100 [Urine:2100]   Physical Exam:  Constitutional: alert, cooperative and no distress  HENT: normocephalic without obvious abnormality  Eyes: PERRL, EOM's grossly intact and symmetric  Respiratory: breathing non-labored at rest  Cardiovascular: regular rate and sinus rhythm  Gastrointestinal: soft, non-tender, and non-distended, no rebound/guarding Musculoskeletal: no edema or wounds, motor and sensation grossly intact, NT    Labs:  CBC Latest Ref Rng & Units 09/13/2019 09/12/2019 09/11/2019   WBC 4.0 - 10.5 K/uL 8.1 8.5 10.1  Hemoglobin 13.0 - 17.0 g/dL 8.9(L) 8.9(L) 9.0(L)  Hematocrit 39 - 52 % 29.8(L) 29.4(L) 30.1(L)  Platelets 150 - 400 K/uL 256 211 254   CMP Latest Ref Rng & Units 09/13/2019 09/12/2019 09/11/2019  Glucose 70 - 99 mg/dL 86 88 86  BUN 8 - 23 mg/dL 8 10 12   Creatinine 0.61 - 1.24 mg/dL 1.43(H) 1.63(H) 1.36(H)  Sodium 135 - 145 mmol/L 140 140 143  Potassium 3.5 - 5.1 mmol/L 4.0 4.4 4.2  Chloride 98 - 111 mmol/L 107 107 110  CO2 22 - 32 mmol/L 25 22 27   Calcium 8.9 - 10.3 mg/dL 8.7(L) 8.9 8.8(L)  Total Protein 6.5 - 8.1 g/dL - - -  Total Bilirubin 0.3 - 1.2 mg/dL - - -  Alkaline Phos 38 - 126 U/L - - -  AST 15 - 41 U/L - - -  ALT 0 - 44 U/L - - -     Imaging studies:   CT Chest/Abdomen/Pelvis (09/13/2019) personally reviewed showing pulmonary nodules which are unlikely metastasis with rectal mass, and radiologist report reviewed:  IMPRESSION: 1. Rectal mass with evidence of local invasion into the mesorectal fat and potential involvement of the right seminal vesicle. Multiple borderline enlarged mesorectal lymph nodes. No other definite evidence of distal metastatic disease elsewhere in the abdomen or pelvis. 2. Small pulmonary nodules measuring 2-3 mm in the lungs bilaterally, highly nonspecific and statistically likely benign. However, close attention on follow-up studies is recommended to ensure their stability as metastatic disease is not entirely excluded. 3. Aortic atherosclerosis, in addition to single-vessel coronary artery disease. Assessment  for potential risk factor modification, dietary therapy or pharmacologic therapy may be warranted, if clinically indicated. 4. Additional incidental findings, as above.  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  Rectal adenocarcinoma N stage: N2  with superior rectal and nodes about the sacral promontory and just above, bordering on nodes outside of regional nodal stations.  Distance from tumor to the internal anal sphincter is 13.3 cm.   Assessment/Plan: (ICD-10's: C18.7) 69 y.o. male with history of GI bleeding found to have non-obstructing rectosigmoid colon mass which was positive for adenocarcinoma on biopsy   - Reviewed staging work up. At this point, given location and size of the mass in regards to the anal sphincter, patient will benefit from port-a-catheter placement and neoadjuvant chemotherapy/XRT. Timing of port placement per Dr Dahlia Byes.   - Continue H pylori treatment with triple therapy x14 days (Clarithromycin, Amoxicllin + PPI)   - I think it would be reasonable to resume diet with no plans for resection at this time.   - Appreciate oncology assistance and recommendations  - Mointor abdominal examination             - Monitor H&H; transfuse as needed >7.0             - Pain control prn; antiemetics prn             - Further management per primary service   All of the above findings and recommendations were discussed with the patient and the medical team, and all of patient's questions were answered to his expressed satisfaction.  -- Edison Simon, PA-C [facility] 09/14/2019, 7:15 AM [phone] M-F: 7am - 4pm

## 2019-09-14 NOTE — Care Management Important Message (Signed)
Important Message  Patient Details  Name: Christopher Delacruz MRN: [id number] Date of Birth: Mar 28, 1950   Medicare Important Message Given:  Yes     [name] 09/14/2019, 11:47 AM

## 2019-09-14 NOTE — Progress Notes (Addendum)
Hematology/Oncology Progress Note [facility] [phone] [phone]  Patient Care Team: Donnie Coffin, MD as PCP - General (Family Medicine)   Name of the patient: Christopher Delacruz  [id number]  1950/09/07  Date of visit: 09/14/19   INTERVAL HISTORY-  Patient has no new complaints.  No family members at bedside. Denies any abdominal pain or difficulty passing bowel movement.  No Known Allergies  Patient Active Problem List   Diagnosis Date Noted  . Colorectal cancer ([place])   . Helicobacter pylori gastritis   . Intestinal neoplasm   . BPH (benign prostatic hyperplasia) 09/07/2019  . Acute renal failure superimposed on stage 3a chronic kidney disease ([place]) 09/07/2019  . Absolute anemia 09/07/2019  . GIB (gastrointestinal bleeding) 09/07/2019  . Iron deficiency anemia due to chronic blood loss 09/07/2019  . CVA (cerebral vascular accident) ([place]) 03/22/2018  . Acute CVA (cerebrovascular accident) ([place]) 03/22/2018  . HLD (hyperlipidemia) 03/21/2018  . Right sided weakness 06/26/2015  . Dysphagia 06/26/2015  . Essential hypertension, malignant 06/26/2015  . Acute renal insufficiency 06/26/2015  . Hypokalemia 06/26/2015  . Cerebral infarction ([place]) 06/23/2015  . Diabetes ([place]) 06/23/2015  . HTN (hypertension) 06/23/2015     Past Medical History:  Diagnosis Date  . CVA (cerebral vascular accident) ([place])   . Diabetes mellitus ([place])   . Hyperlipidemia   . Hypertension      Past Surgical History:  Procedure Laterality Date  . COLONOSCOPY N/A 09/11/2019   Procedure: COLONOSCOPY;  Surgeon: [name], MD;  Location: [facility] ENDOSCOPY;  Service: Endoscopy;  Laterality: N/A;  . COLONOSCOPY WITH PROPOFOL N/A 09/12/2019   Procedure: COLONOSCOPY WITH PROPOFOL;  Surgeon: [name], MD;  Location: [facility] ENDOSCOPY;  Service: Gastroenterology;  Laterality: N/A;  . COLONOSCOPY WITH PROPOFOL N/A 09/13/2019   Procedure: COLONOSCOPY WITH PROPOFOL;   Surgeon: [name], MD;  Location: [facility] ENDOSCOPY;  Service: Gastroenterology;  Laterality: N/A;  . ESOPHAGOGASTRODUODENOSCOPY N/A 09/11/2019   Procedure: ESOPHAGOGASTRODUODENOSCOPY (EGD);  Surgeon: [name], MD;  Location: [facility] ENDOSCOPY;  Service: Endoscopy;  Laterality: N/A;  . EXPLORE EYE SOCKET      Social History   Socioeconomic History  . Marital status: Divorced    Spouse name: Not on file  . Number of children: Not on file  . Years of education: Not on file  . Highest education level: Not on file  Occupational History  . Not on file  Tobacco Use  . Smoking status: Never Smoker  . Smokeless tobacco: Never Used  Vaping Use  . Vaping Use: Never used  Substance and Sexual Activity  . Alcohol use: Never    Alcohol/week: 0.0 standard drinks  . Drug use: Never  . Sexual activity: Not on file  Other Topics Concern  . Not on file  Social History Narrative  . Not on file   Social Determinants of Health   Financial Resource Strain:   . Difficulty of Paying Living Expenses:   Food Insecurity:   . Worried About Charity fundraiser in the Last Year:   . Arboriculturist in the Last Year:   Transportation Needs:   . Film/video editor (Medical):   Marland Kitchen Lack of Transportation (Non-Medical):   Physical Activity:   . Days of Exercise per Week:   . Minutes of Exercise per Session:   Stress:   . Feeling of Stress :   Social Connections:   . Frequency of Communication with Friends and Family:   . Frequency of  Social Gatherings with Friends and Family:   . Attends Religious Services:   . Active Member of Clubs or Organizations:   . Attends Archivist Meetings:   Marland Kitchen Marital Status:   Intimate Partner Violence:   . Fear of Current or Ex-Partner:   . Emotionally Abused:   Marland Kitchen Physically Abused:   . Sexually Abused:      Family History  Problem Relation Age of Onset  . Heart attack Sister   . Leukemia Brother   . Stroke Brother      Current  Facility-Administered Medications:  .  0.9 %  sodium chloride infusion, , Intravenous, PRN, Sharen Hones, MD, Paused at 09/11/19 1355 .  acetaminophen (TYLENOL) tablet 650 mg, 650 mg, Oral, Q6H PRN, Ivor Costa, MD .  amLODipine (NORVASC) tablet 10 mg, 10 mg, Oral, Daily, Ivor Costa, MD, 10 mg at 09/14/19 1027 .  amoxicillin (AMOXIL) capsule 1,000 mg, 1,000 mg, Oral, Q12H, Tylene Fantasia, PA-C, 1,000 mg at 09/14/19 1027 .  ceFAZolin (ANCEF) IVPB 2g/100 mL premix, 2 g, Intravenous, On Call to OR, Pabon, Diego F, MD .  6 CHG cloth bath night before surgery, , , Once **AND** [START ON 09/15/2019] 6 CHG cloth bath AM of surgery, , , Once **AND** Chlorhexidine Gluconate Cloth 2 % PADS 6 each, 6 each, Topical, Once **AND** Chlorhexidine Gluconate Cloth 2 % PADS 6 each, 6 each, Topical, Once, Pabon, Diego F, MD .  clarithromycin (BIAXIN) tablet 500 mg, 500 mg, Oral, Q12H, Tylene Fantasia, PA-C, 500 mg at 09/14/19 1027 .  feeding supplement (ENSURE ENLIVE) (ENSURE ENLIVE) liquid 237 mL, 237 mL, Oral, TID BM, Sharen Hones, MD, 237 mL at 09/14/19 1026 .  finasteride (PROSCAR) tablet 5 mg, 5 mg, Oral, Daily, Ivor Costa, MD, 5 mg at 09/14/19 1027 .  multivitamin with minerals tablet 1 tablet, 1 tablet, Oral, Daily, Fritzi Mandes, MD, 1 tablet at 09/13/19 1435 .  ondansetron (ZOFRAN) injection 4 mg, 4 mg, Intravenous, Q8H PRN, Ivor Costa, MD .  pantoprazole (PROTONIX) EC tablet 40 mg, 40 mg, Oral, BID AC, Fritzi Mandes, MD, 40 mg at 09/14/19 1027 .  tamsulosin (FLOMAX) capsule 0.4 mg, 0.4 mg, Oral, Daily, Ivor Costa, MD, 0.4 mg at 09/14/19 1027 .  vitamin B-12 (CYANOCOBALAMIN) tablet 1,000 mcg, 1,000 mcg, Oral, Daily, Fritzi Mandes, MD, 1,000 mcg at 09/13/19 1437   Physical exam:  Vitals:   09/13/19 1941 09/13/19 2130 09/14/19 0431 09/14/19 1153  BP: (!) 96/54 (!) 115/58 132/71 (!) 114/48  Pulse: 89  74 75  Resp: 18  (!) 22   Temp: 98.6 F (37 C)  98.5 F (36.9 C) 98.8 F (37.1 C)  TempSrc: Oral  Oral  Oral  SpO2: 97%  92% 95%  Weight:      Height:       Physical Exam Constitutional:      General: He is not in acute distress.    Appearance: He is not diaphoretic.  HENT:     Head: Normocephalic and atraumatic.     Nose: Nose normal.     Mouth/Throat:     Pharynx: No oropharyngeal exudate.  Eyes:     General: No scleral icterus.    Pupils: Pupils are equal, round, and reactive to light.  Cardiovascular:     Rate and Rhythm: Normal rate and regular rhythm.     Heart sounds: No murmur heard.   Pulmonary:     Effort: Pulmonary effort is normal. No respiratory distress.  Breath sounds: No rales.  Chest:     Chest wall: No tenderness.  Abdominal:     General: There is no distension.     Palpations: Abdomen is soft.     Tenderness: There is no abdominal tenderness.  Musculoskeletal:        General: Normal range of motion.     Cervical back: Normal range of motion and neck supple.  Skin:    General: Skin is warm and dry.     Findings: No erythema.  Neurological:     Mental Status: He is alert. Mental status is at baseline.     Cranial Nerves: No cranial nerve deficit.     Motor: No abnormal muscle tone.     Coordination: Coordination normal.  Psychiatric:        Mood and Affect: Affect normal.        CMP Latest Ref Rng & Units 09/13/2019  Glucose 70 - 99 mg/dL 86  BUN 8 - 23 mg/dL 8  Creatinine 0.61 - 1.24 mg/dL 1.43(H)  Sodium 135 - 145 mmol/L 140  Potassium 3.5 - 5.1 mmol/L 4.0  Chloride 98 - 111 mmol/L 107  CO2 22 - 32 mmol/L 25  Calcium 8.9 - 10.3 mg/dL 8.7(L)  Total Protein 6.5 - 8.1 g/dL -  Total Bilirubin 0.3 - 1.2 mg/dL -  Alkaline Phos 38 - 126 U/L -  AST 15 - 41 U/L -  ALT 0 - 44 U/L -   CBC Latest Ref Rng & Units 09/13/2019  WBC 4.0 - 10.5 K/uL 8.1  Hemoglobin 13.0 - 17.0 g/dL 8.9(L)  Hematocrit 39 - 52 % 29.8(L)  Platelets 150 - 400 K/uL 256    RADIOGRAPHIC STUDIES: I have personally reviewed the radiological images as listed and agreed  with the findings in the report. CT CHEST W CONTRAST  Result Date: 09/13/2019 CLINICAL DATA:  69 year old male with history of colorectal cancer. Staging examination. EXAM: CT CHEST, ABDOMEN, AND PELVIS WITH CONTRAST TECHNIQUE: Multidetector CT imaging of the chest, abdomen and pelvis was performed following the standard protocol during bolus administration of intravenous contrast. CONTRAST:  184mL OMNIPAQUE IOHEXOL 300 MG/ML  SOLN COMPARISON:  No priors. FINDINGS: CT CHEST FINDINGS Cardiovascular: Heart size is normal. There is no significant pericardial fluid, thickening or pericardial calcification. There is aortic atherosclerosis, as well as atherosclerosis of the great vessels of the mediastinum and the coronary arteries, including calcified atherosclerotic plaque in the ramus intermedius coronary artery. Mediastinum/Nodes: No pathologically enlarged mediastinal or hilar lymph nodes. Esophagus is unremarkable in appearance. No axillary lymphadenopathy. Lungs/Pleura: Dependent areas of subsegmental atelectasis are noted in the lower lobes of the lungs bilaterally. No acute consolidative airspace disease. No pleural effusions. A few scattered tiny 2-3 mm pulmonary nodules are noted in the lungs bilaterally, nonspecific. No larger more suspicious appearing pulmonary nodules or masses are noted. Musculoskeletal: There are no aggressive appearing lytic or blastic lesions noted in the visualized portions of the skeleton. CT ABDOMEN PELVIS FINDINGS Hepatobiliary: No suspicious cystic or solid hepatic lesions. No intra or extrahepatic biliary ductal dilatation. Gallbladder is normal in appearance. Pancreas: No pancreatic mass. No pancreatic ductal dilatation. No pancreatic or peripancreatic fluid collections or inflammatory changes. Spleen: Unremarkable. Adrenals/Urinary Tract: Tiny subcentimeter low-attenuation lesions in the right kidney, too small to definitively characterize, but statistically likely to  represent tiny cysts. Left kidney and bilateral adrenal glands are normal in appearance. No hydroureteronephrosis. Urinary bladder is normal in appearance. Stomach/Bowel: Normal appearance of the stomach. No pathologic  dilatation of small bowel or colon. Masslike irregular mural thickening of the rectum which is difficult to discretely measure given its irregular appearance, but is best demonstrated on axial image 116 of series 504, presumably a primary rectal neoplasm. Notably, this obliterates the fat plane in the mesorectal fat between the lesion and the right seminal vesicle (best appreciated on axial image 116 of series 504). The appendix is not confidently identified and may be surgically absent. Regardless, there are no inflammatory changes noted adjacent to the cecum to suggest the presence of an acute appendicitis at this time. Vascular/Lymphatic: Aortic atherosclerosis, without evidence of aneurysm or dissection in the abdominal or pelvic vasculature. Several prominent but nonenlarged mesorectal lymph nodes are noted measuring up to 7 mm in short axis. No other lymphadenopathy noted in the abdomen or pelvis. Reproductive: There is some obscuration of the fat plane between the right seminal vesicle and the rectal mass (discussed above). Prostate gland and seminal vesicles are otherwise unremarkable in appearance. Other: No significant volume of ascites.  No pneumoperitoneum. Musculoskeletal: There are no aggressive appearing lytic or blastic lesions noted in the visualized portions of the skeleton. IMPRESSION: 1. Rectal mass with evidence of local invasion into the mesorectal fat and potential involvement of the right seminal vesicle. Multiple borderline enlarged mesorectal lymph nodes. No other definite evidence of distal metastatic disease elsewhere in the abdomen or pelvis. 2. Small pulmonary nodules measuring 2-3 mm in the lungs bilaterally, highly nonspecific and statistically likely benign. However,  close attention on follow-up studies is recommended to ensure their stability as metastatic disease is not entirely excluded. 3. Aortic atherosclerosis, in addition to single-vessel coronary artery disease. Assessment for potential risk factor modification, dietary therapy or pharmacologic therapy may be warranted, if clinically indicated. 4. Additional incidental findings, as above. Electronically Signed   By: Vinnie Langton M.D.   On: 09/13/2019 19:56   MR PELVIS WO CONTRAST  Result Date: 09/14/2019 CLINICAL DATA:  Rectal mass with potential local invasion based on prior imaging EXAM: MRI PELVIS WITHOUT CONTRAST TECHNIQUE: Multiplanar multisequence MR imaging of the pelvis was performed. No intravenous contrast was administered. Small amount of [place] gel was administered per rectum to optimize tumor evaluation. COMPARISON:  09/13/2019 FINDINGS: TUMOR LOCATION Tumor distance from Anal Verge/Skin Surface:  13.3 cm Tumor distance to Internal Anal Sphincter: 5.9 cm TUMOR DESCRIPTION Circumferential Extent: Circumferential tumor in the upper margin of the tumor. The tumor favors the RIGHT anterolateral rectum and is eccentric towards the inferior margin. Tumor Length: 8.2 cm T - CATEGORY Extension through Muscularis Propria: Yes, approximately 1.5-1.6 cm beyond the confines of the rectum in the axial plane. At least 6-7 mm beyond in the sagittal. Note that bulging of the anterior rectum with a maintained defined T2 hypointense rim is noted along the RIGHT anterolateral margin. Shortest Distance of any tumor/node from Mesorectal Fascia: 0 mm Extramural Vascular Invasion/Tumor Thrombus: Yes expanded low signal vein arising from an area of extra rectal extension along the LEFT lateral rectal margin (image 20, series 8) also on image 23 of series 9. Expanded vein with low signal also demonstrated on image 31 of series 8 Invasion of Anterior Peritoneal Reflection: Yes Involvement of Adjacent Organs or Pelvic Sidewall:  Yes, invasion of the seminal vesicles best seen on image 21 of series 8, tumor passes through the anterior peritoneal reflection at this level extending to the seminal vesicles greatest on the RIGHT just to the RIGHT of midline. Levator Ani Involvement: No N - CATEGORY Mesorectal  Lymph Nodes >=12mm: N2 with multiple lymph nodes in the mesorectum extending high along the superior rectal vein and into the nodal chains along the inferior IMV as seen on the prior CT. Greater than 4 lymph nodes meet this criteria Extra-mesorectal Lymphadenopathy: Small lymph nodes along venous drainage pathways and lymphatic pathways above the mesorectum, no pelvic sidewall adenopathy. Other: Urinary Tract: Urinary bladder is grossly normal. No signs of distal ureteral dilation. Bowel: As above Vascular/Lymphatic: Lymphatic assessment as described. Reproductive: Seminal vesicle involvement as discussed. Other: None Musculoskeletal: Limited assessment of musculoskeletal structures is unremarkable. 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 Rectal adenocarcinoma N stage: N2 with superior rectal and nodes about the sacral promontory and just above, bordering on nodes outside of regional nodal stations. Distance from tumor to the internal anal sphincter is 13.3 cm. Electronically Signed   By: Zetta Bills M.D.   On: 09/14/2019 08:18   CT ABDOMEN PELVIS W CONTRAST  Result Date: 09/13/2019 CLINICAL DATA:  69 year old male with history of colorectal cancer. Staging examination. EXAM: CT CHEST, ABDOMEN, AND PELVIS WITH CONTRAST TECHNIQUE: Multidetector CT imaging of the chest, abdomen and pelvis was performed following the standard protocol during bolus administration of intravenous contrast. CONTRAST:  181mL OMNIPAQUE IOHEXOL 300 MG/ML  SOLN COMPARISON:  No priors. FINDINGS: CT CHEST FINDINGS Cardiovascular: Heart size is normal. There is no significant pericardial fluid, thickening or pericardial  calcification. There is aortic atherosclerosis, as well as atherosclerosis of the great vessels of the mediastinum and the coronary arteries, including calcified atherosclerotic plaque in the ramus intermedius coronary artery. Mediastinum/Nodes: No pathologically enlarged mediastinal or hilar lymph nodes. Esophagus is unremarkable in appearance. No axillary lymphadenopathy. Lungs/Pleura: Dependent areas of subsegmental atelectasis are noted in the lower lobes of the lungs bilaterally. No acute consolidative airspace disease. No pleural effusions. A few scattered tiny 2-3 mm pulmonary nodules are noted in the lungs bilaterally, nonspecific. No larger more suspicious appearing pulmonary nodules or masses are noted. Musculoskeletal: There are no aggressive appearing lytic or blastic lesions noted in the visualized portions of the skeleton. CT ABDOMEN PELVIS FINDINGS Hepatobiliary: No suspicious cystic or solid hepatic lesions. No intra or extrahepatic biliary ductal dilatation. Gallbladder is normal in appearance. Pancreas: No pancreatic mass. No pancreatic ductal dilatation. No pancreatic or peripancreatic fluid collections or inflammatory changes. Spleen: Unremarkable. Adrenals/Urinary Tract: Tiny subcentimeter low-attenuation lesions in the right kidney, too small to definitively characterize, but statistically likely to represent tiny cysts. Left kidney and bilateral adrenal glands are normal in appearance. No hydroureteronephrosis. Urinary bladder is normal in appearance. Stomach/Bowel: Normal appearance of the stomach. No pathologic dilatation of small bowel or colon. Masslike irregular mural thickening of the rectum which is difficult to discretely measure given its irregular appearance, but is best demonstrated on axial image 116 of series 504, presumably a primary rectal neoplasm. Notably, this obliterates the fat plane in the mesorectal fat between the lesion and the right seminal vesicle (best appreciated on  axial image 116 of series 504). The appendix is not confidently identified and may be surgically absent. Regardless, there are no inflammatory changes noted adjacent to the cecum to suggest the presence of an acute appendicitis at this time. Vascular/Lymphatic: Aortic atherosclerosis, without evidence of aneurysm or dissection in the abdominal or pelvic vasculature. Several prominent but nonenlarged mesorectal lymph nodes are noted measuring up to 7 mm in short axis. No other lymphadenopathy noted in the abdomen or pelvis. Reproductive: There is some obscuration of the fat plane  between the right seminal vesicle and the rectal mass (discussed above). Prostate gland and seminal vesicles are otherwise unremarkable in appearance. Other: No significant volume of ascites.  No pneumoperitoneum. Musculoskeletal: There are no aggressive appearing lytic or blastic lesions noted in the visualized portions of the skeleton. IMPRESSION: 1. Rectal mass with evidence of local invasion into the mesorectal fat and potential involvement of the right seminal vesicle. Multiple borderline enlarged mesorectal lymph nodes. No other definite evidence of distal metastatic disease elsewhere in the abdomen or pelvis. 2. Small pulmonary nodules measuring 2-3 mm in the lungs bilaterally, highly nonspecific and statistically likely benign. However, close attention on follow-up studies is recommended to ensure their stability as metastatic disease is not entirely excluded. 3. Aortic atherosclerosis, in addition to single-vessel coronary artery disease. Assessment for potential risk factor modification, dietary therapy or pharmacologic therapy may be warranted, if clinically indicated. 4. Additional incidental findings, as above. Electronically Signed   By: Vinnie Langton M.D.   On: 09/13/2019 19:56    Assessment and plan-  #Rectal cancer, T4b N2 M0 CT images and MRI images were independently reviewed by me and discussed with  patient. Small lung nodules 2 to 3 mm bilaterally, which are most likely benign however cannot rule out metastasis.  Nodules are too small for characterization.  I will get the benefit of doubt and assume these are benign nodules.  Wants attention on follow-up images. Recommend neoadjuvant chemotherapy with FOLFOX for 4 months followed by concurrent chemoradiation. Discussed with Dr.Pabon, no need for diverting colostomy at this point.  Patient will get Mediport placed during this admission to facilitate start of chemotherapy. He has bloody diarrhea, not currently.  I will discuss with radiation oncology.  He may need a short course of radiation or we can start with systemic chemotherapy and if bleeding gets worse, will switch to radiation which will help his bleeding symptoms.  #Iron deficiency anemia, patient is status post PRBC transfusion.  Currently hemoglobin is 8.9.  Patient has been ordered a dose of IV Venofer treatment.  I will write another IV Venofer treatment prior to his discharge considering ongoing blood loss.   #Vitamin B12 deficiency, patient started on vitamin B12 injection as well as oral supplementation. #  H. pylori associated gastritis.  Patient has been started on H. pylori treatment I called patient's nephew [name] and updated him about the recommendations. [name] brought up concerns that patient may not be able to care himself after discharge. He has been more forgetful lately.Mark appreciates the update  Thank you for allowing me to participate in the care of this patient.   [name], MD, PhD Hematology Oncology [facility] Pager- [phone] 09/14/2019

## 2019-09-15 ENCOUNTER — Inpatient Hospital Stay: Payer: Medicare Other | Admitting: Anesthesiology

## 2019-09-15 ENCOUNTER — Inpatient Hospital Stay: Payer: Medicare Other

## 2019-09-15 ENCOUNTER — Encounter
Admission: EM | Disposition: A | Discharge status: Skilled Nursing Facility | Payer: Self-pay | Source: Home / Self Care | Attending: Internal Medicine

## 2019-09-15 DIAGNOSIS — R531 Weakness: Secondary | ICD-10-CM

## 2019-09-15 DIAGNOSIS — Z7189 Other specified counseling: Secondary | ICD-10-CM

## 2019-09-15 HISTORY — PX: PORTACATH PLACEMENT: SHX2246

## 2019-09-15 LAB — BASIC METABOLIC PANEL
Anion gap: 8 (ref 5–15)
BUN: 18 mg/dL (ref 8–23)
CO2: 24 mmol/L (ref 22–32)
Calcium: 8.5 mg/dL — ABNORMAL LOW (ref 8.9–10.3)
Chloride: 109 mmol/L (ref 98–111)
Creatinine, Ser: 1.86 mg/dL — ABNORMAL HIGH (ref 0.61–1.24)
GFR calc Af Amer: 42 mL/min — ABNORMAL LOW (ref >60)
GFR calc non Af Amer: 36 mL/min — ABNORMAL LOW (ref >60)
Glucose, Bld: 91 mg/dL (ref 70–99)
Potassium: 3.9 mmol/L (ref 3.5–5.1)
Sodium: 141 mmol/L (ref 135–145)

## 2019-09-15 LAB — GLUCOSE, CAPILLARY: Glucose-Capillary: 105 mg/dL — ABNORMAL HIGH (ref 70–99)

## 2019-09-15 SURGERY — INSERTION, TUNNELED CENTRAL VENOUS DEVICE, WITH PORT
Anesthesia: General

## 2019-09-15 MED ORDER — BUPIVACAINE-EPINEPHRINE (PF) 0.25% -1:200000 IJ SOLN
INTRAMUSCULAR | Status: AC
  Filled 2019-09-15: qty 20

## 2019-09-15 MED ORDER — LIDOCAINE HCL (PF) 1 % IJ SOLN
INTRAMUSCULAR | Status: AC
  Filled 2019-09-15: qty 30

## 2019-09-15 MED ORDER — LORAZEPAM 2 MG/ML IJ SOLN
1.0000 mg | Freq: Once | INTRAMUSCULAR | Status: DC | PRN
  Filled 2019-09-15: qty 1

## 2019-09-15 MED ORDER — LIDOCAINE HCL (PF) 2 % IJ SOLN
INTRAMUSCULAR | Status: AC
  Filled 2019-09-15: qty 5

## 2019-09-15 MED ORDER — BUPIVACAINE-EPINEPHRINE (PF) 0.25% -1:200000 IJ SOLN
INTRAMUSCULAR | Status: AC
  Filled 2019-09-15: qty 10

## 2019-09-15 MED ORDER — GADOBUTROL 1 MMOL/ML IV SOLN
10.0000 mL | Freq: Once | INTRAVENOUS | Status: AC | PRN
  Administered 2019-09-15: 10 mL via INTRAVENOUS

## 2019-09-15 MED ORDER — HEPARIN SODIUM (PORCINE) 5000 UNIT/ML IJ SOLN
INTRAMUSCULAR | Status: AC
  Filled 2019-09-15: qty 1

## 2019-09-15 MED ORDER — LORAZEPAM 2 MG/ML IJ SOLN: 1.0000 mg | Freq: Once | INTRAMUSCULAR | Status: DC

## 2019-09-15 MED ORDER — DEXMEDETOMIDINE HCL IN NACL 80 MCG/20ML IV SOLN
INTRAVENOUS | Status: AC
  Filled 2019-09-15: qty 20

## 2019-09-15 MED ORDER — DEXMEDETOMIDINE HCL 200 MCG/2ML IV SOLN
INTRAVENOUS | Status: DC | PRN
  Administered 2019-09-15: 12 ug via INTRAVENOUS
  Administered 2019-09-15: 8 ug via INTRAVENOUS

## 2019-09-15 MED ORDER — PROPOFOL 10 MG/ML IV BOLUS
INTRAVENOUS | Status: DC | PRN
  Administered 2019-09-15: 50 mg via INTRAVENOUS
  Administered 2019-09-15: 30 mg via INTRAVENOUS

## 2019-09-15 MED ORDER — ONDANSETRON HCL 4 MG/2ML IJ SOLN: 4.0000 mg | Freq: Once | INTRAMUSCULAR | Status: DC | PRN

## 2019-09-15 MED ORDER — LIDOCAINE HCL 1 % IJ SOLN
INTRAMUSCULAR | Status: DC | PRN
  Administered 2019-09-15: 27 mL

## 2019-09-15 MED ORDER — OXYCODONE HCL 5 MG PO TABS: 5.0000 mg | ORAL_TABLET | ORAL | Status: DC | PRN

## 2019-09-15 MED ORDER — SODIUM CHLORIDE 0.9 % IV SOLN
INTRAVENOUS | Status: DC | PRN
  Administered 2019-09-15: 2 mL via INTRAMUSCULAR

## 2019-09-15 MED ORDER — CEFAZOLIN SODIUM-DEXTROSE 2-3 GM-%(50ML) IV SOLR
INTRAVENOUS | Status: DC | PRN
  Administered 2019-09-15: 2 g via INTRAVENOUS

## 2019-09-15 MED ORDER — PROPOFOL 500 MG/50ML IV EMUL
INTRAVENOUS | Status: AC
  Filled 2019-09-15: qty 50

## 2019-09-15 MED ORDER — FENTANYL CITRATE (PF) 100 MCG/2ML IJ SOLN: 25.0000 ug | INTRAMUSCULAR | Status: DC | PRN

## 2019-09-15 MED ORDER — ACETAMINOPHEN 325 MG PO TABS: 650.0000 mg | ORAL_TABLET | ORAL | Status: DC | PRN

## 2019-09-15 MED ORDER — CEFAZOLIN SODIUM-DEXTROSE 2-4 GM/100ML-% IV SOLN
INTRAVENOUS | Status: AC
  Filled 2019-09-15: qty 100

## 2019-09-15 MED ORDER — ASPIRIN EC 81 MG PO TBEC
81.0000 mg | DELAYED_RELEASE_TABLET | Freq: Every day | ORAL | Status: DC
  Administered 2019-09-16: 81 mg via ORAL
  Filled 2019-09-15: qty 1

## 2019-09-15 MED ORDER — PROPOFOL 500 MG/50ML IV EMUL
INTRAVENOUS | Status: DC | PRN
  Administered 2019-09-15: 25 ug/kg/min via INTRAVENOUS

## 2019-09-15 MED ORDER — SODIUM CHLORIDE 0.9 % IV SOLN: INTRAVENOUS | Status: DC

## 2019-09-15 SURGICAL SUPPLY — 36 items
BAG DECANTER FOR FLEXI CONT (MISCELLANEOUS) ×6 IMPLANT
BAND RUBBER 3X1/6 TAN STRL (MISCELLANEOUS) ×3 IMPLANT
BLADE SURG SZ11 CARB STEEL (BLADE) ×3 IMPLANT
BOOT SUTURE AID YELLOW STND (SUTURE) ×3 IMPLANT
CANISTER SUCT 1200ML W/VALVE (MISCELLANEOUS) ×3 IMPLANT
CHLORAPREP W/TINT 26 (MISCELLANEOUS) ×3 IMPLANT
COVER LIGHT HANDLE STERIS (MISCELLANEOUS) ×6 IMPLANT
COVER WAND RF STERILE (DRAPES) ×3 IMPLANT
DERMABOND ADVANCED (GAUZE/BANDAGES/DRESSINGS) ×2
DERMABOND ADVANCED .7 DNX12 (GAUZE/BANDAGES/DRESSINGS) ×1 IMPLANT
DRAPE 3/4 80X56 (DRAPES) ×3 IMPLANT
DRAPE C-ARM XRAY 36X54 (DRAPES) ×6 IMPLANT
DRAPE INCISE IOBAN 66X45 STRL (DRAPES) ×3 IMPLANT
ELECT CAUTERY BLADE 6.4 (BLADE) ×3 IMPLANT
ELECT REM PT RETURN 9FT ADLT (ELECTROSURGICAL) ×3
ELECTRODE REM PT RTRN 9FT ADLT (ELECTROSURGICAL) ×1 IMPLANT
GEL ULTRASOUND 20GR AQUASONIC (MISCELLANEOUS) ×3 IMPLANT
GLOVE BIO SURGEON STRL SZ7 (GLOVE) ×3 IMPLANT
GOWN STRL REUS W/ TWL LRG LVL3 (GOWN DISPOSABLE) ×2 IMPLANT
GOWN STRL REUS W/TWL LRG LVL3 (GOWN DISPOSABLE) ×4
IV NS 500ML (IV SOLUTION) ×2
IV NS 500ML BAXH (IV SOLUTION) ×1 IMPLANT
KIT PORT POWER 8FR ISP CVUE (Port) ×3 IMPLANT
NEEDLE HYPO 22GX1.5 SAFETY (NEEDLE) ×3 IMPLANT
PACK PORT-A-CATH (MISCELLANEOUS) ×3 IMPLANT
SPONGE LAP 18X18 RF (DISPOSABLE) ×3 IMPLANT
SUT MNCRL AB 4-0 PS2 18 (SUTURE) ×3 IMPLANT
SUT PROLENE 2-0 (SUTURE) ×2
SUT PROLENE 2-0 TS 14X2 ARM (SUTURE) ×1
SUT VIC AB 3-0 SH 27 (SUTURE) ×4
SUT VIC AB 3-0 SH 27X BRD (SUTURE) ×2 IMPLANT
SUTURE PROLEN 2-0 TS 14X2 ARM (SUTURE) ×1 IMPLANT
SYR 10ML LL (SYRINGE) ×3 IMPLANT
SYR 20ML LL LF (SYRINGE) ×3 IMPLANT
SYR 5ML LL (SYRINGE) ×3 IMPLANT
TOWEL OR 17X26 4PK STRL BLUE (TOWEL DISPOSABLE) ×3 IMPLANT

## 2019-09-15 NOTE — Progress Notes (Signed)
PROGRESS NOTE    Christopher Delacruz  KGY:[id number] DOB: [birth year] DOA: 09/07/2019 PCP: Donnie Coffin, MD   Chief complaint.  Rectal bleeding.  Brief Narrative:  Christopher Delacruz a 69 y.o.malewith medical history significant ofstroke withmildright-sided weakness onPlavix, hypertension, hyperlipidemia, diet-controlled diabetes, upper GI bleeding, BPH, CKD stage III, who presents with rectal incontinenceand bloody diarrhea.  Patient received 2 units of PRBC. Hemoglobin has been stable. GI consult is obtained, scheduled colonoscopy on 8/15.  8/15.EGD showed some erosion in the duodenum. Colonoscopy was a poor prep, suspect colon mass. Repeat colonoscopy is scheduled for tomorrow.  8/16.  Poor preparation for colonoscopy today, colonoscopy will be redone tomorrow  8/17.  Colonoscopy performed the third time, tumor marked for surgery.  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  Surgical consult obtained, work-up started, surgery planned for Friday.  8/18 patient was seen and examined, hemodynamically stable, planning for surgery this Friday  09/15/2019 patient was seen, nephew who is assuming POA present at bedside Planning for port catheter placement today, SW -working on discharge planning likely SNF  Assessment & Plan:   Principal Problem:   GIB (gastrointestinal bleeding) Active Problems:   HTN (hypertension)   HLD (hyperlipidemia)   CVA (cerebral vascular accident) (Christopher Delacruz)   BPH (benign prostatic hyperplasia)   Acute renal failure superimposed on stage 3a chronic kidney disease (Christopher Delacruz)   Absolute anemia   Iron deficiency anemia due to chronic blood loss   Intestinal neoplasm   Colorectal cancer (Christopher Delacruz)   Helicobacter pylori gastritis   Rectal cancer (Christopher Delacruz)  Acute blood loss anemia secondary to rectal bleeding. Iron deficient anemia and a B12 deficient anemia. -Monitoring-H&H stable  Patient also has significant iron deficiency, was treated with  IV iron.   Initiating iron supplement Patient also has B12 deficiency, will give injection of B12.  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 -General surgery following,-sees recommendation for surgery -Patient will pursue with chemo radiation therapy Heme oncology Dr. Tasia Catchings -following closely Planning for port a catheter placement today 09/15/2019 With mets, ruling out brain mets by obtaining MRI of the brain today  H. pylori. Started treatment per surgery.  Essential hypertension.  Continue home medicines.  Acute kidney injury superimposed on stage IIIa chronic kidney disease. Renal function has improved. -Monitoring closely  Cognitive debility, poor insight Patient's nephew Christopher Delacruz active to obtain POA Case management social worker assisting Continue to be involved in patient care  Physical debility -superimposed by cognitive debility Patient continued to decline -Reconsulting PT/OT for further evaluation recommendation Anticipating SNF placement    DVT prophylaxis:SCDs Code Status:Full Family Communication:None Disposition Plan:  Patient came from:Home(was living alone, physically and mentall declining  Anticipated d/c place:SNF in 1-2 days  Appreciate social work assist  Patient nephew Christopher Delacruz becoming a POA  Barriers to d/c OR conditions which need to be met to effect a safe d/c: Obtaining MRI of the brain today,  placement of port a catheter  Consultants:  GI.  General surgery.  Procedures:None Antimicrobials:None    Subjective: Patient doing well today.  No additional rectal bleeding. Denies any short of breath or cough. No fever chills. No dysuria hematuria.  Objective: Vitals:   09/14/19 2144 09/15/19 0507 09/15/19 1200 09/15/19 1533  BP: (!) 141/67 134/75 139/62 (!) 155/76   Pulse: 64 65  63  Resp: 20 20 18 18   Temp: 98.7 F (37.1 C) 98.3 F (36.8 C) 98.4 F (36.9 C)   TempSrc: Oral Oral Oral  SpO2: 93% 94% 97% 91%  Weight:      Height:        Intake/Output Summary (Last 24 hours) at 09/15/2019 1548 Last data filed at 09/15/2019 0437 Gross per 24 hour  Intake 0 ml  Output 950 ml  Net -950 ml   Filed Weights   09/08/19 1107 09/11/19 1450 09/13/19 1126  Weight: 102.4 kg 102.4 kg 102.4 kg    Examination:    Physical Exam:   General:  Alert, oriented, cooperative, no distress; poor insight  HEENT:  No poor insightrmocephalic, PERRL, otherwise with in Normal limits   Neuro:  CNII-XII intact. , normal motor and sensation, reflexes intact   Lungs:   Clear to auscultation BL, Respirations unlabored, no wheezes / crackles  Cardio:    S1/S2, RRR, No murmure, No Rubs or Gallops   Abdomen:   Soft, non-tender, bowel sounds active all four quadrants,  no guarding or peritoneal signs.  Muscular skeletal:  Limited exam - in bed, able to move all 4 extremities, Normal strength,  2+ pulses,  symmetric, No pitting edema  Skin:  Dry, warm to touch, negative for any Rashes, No open wounds  Wounds: Please see nursing documentation              Data Reviewed: I have personally reviewed following labs and imaging studies  CBC: Recent Labs  Lab 09/08/19 1855 09/09/19 0654 09/11/19 0936 09/12/19 0747 09/13/19 0535  WBC 9.5 9.9 10.1 8.5 8.1  NEUTROABS  --   --  7.1 5.6 5.4  HGB 9.1* 9.0* 9.0* 8.9* 8.9*  HCT 28.5* 28.3* 30.1* 29.4* 29.8*  MCV 72.0* 72.6* 74.3* 74.6* 75.3*  PLT 289 273 254 211 850   Basic Metabolic Panel: Recent Labs  Lab 09/11/19 0936 09/12/19 0747 09/13/19 0535 09/15/19 0923  NA 143 140 140 141  K 4.2 4.4 4.0 3.9  CL 110 107 107 109  CO2 27 22 25 24   GLUCOSE 86 88 86 91  BUN 12 10 8 18   CREATININE 1.36* 1.63* 1.43* 1.86*  CALCIUM 8.8* 8.9 8.7* 8.5*  MG 1.9 2.1 2.1  --    GFR: Estimated Creatinine Clearance: 45.7  mL/min (A) (by C-G formula based on SCr of 1.86 mg/dL (H)). Liver Function Tests: No results for input(s): AST, ALT, ALKPHOS, BILITOT, PROT, ALBUMIN in the last 168 hours. No results for input(s): LIPASE, AMYLASE in the last 168 hours. No results for input(s): AMMONIA in the last 168 hours. Coagulation Profile: No results for input(s): INR, PROTIME in the last 168 hours. Cardiac Enzymes: No results for input(s): CKTOTAL, CKMB, CKMBINDEX, TROPONINI in the last 168 hours. BNP (last 3 results) No results for input(s): PROBNP in the last 8760 hours. HbA1C: No results for input(s): HGBA1C in the last 72 hours. CBG: Recent Labs  Lab 09/13/19 1120  GLUCAP 83   Lipid Profile: No results for input(s): CHOL, HDL, LDLCALC, TRIG, CHOLHDL, LDLDIRECT in the last 72 hours. Thyroid Function Tests: No results for input(s): TSH, T4TOTAL, FREET4, T3FREE, THYROIDAB in the last 72 hours. Anemia Panel: No results for input(s): VITAMINB12, FOLATE, FERRITIN, TIBC, IRON, RETICCTPCT in the last 72 hours. Sepsis Labs: No results for input(s): PROCALCITON, LATICACIDVEN in the last 168 hours.  Recent Results (from the past 240 hour(s))  SARS Coronavirus 2 by RT PCR (hospital order, performed in [facility] hospital lab) Nasopharyngeal Nasopharyngeal Swab     Status: None   Collection Time: 09/07/19  7:22 AM   Specimen: Nasopharyngeal Swab  Result Value Ref Range Status   SARS Coronavirus 2 NEGATIVE NEGATIVE Final    Comment: (NOTE) SARS-CoV-2 target nucleic acids are NOT DETECTED.  The SARS-CoV-2 RNA is generally detectable in upper and lower respiratory specimens during the acute phase of infection. The lowest concentration of SARS-CoV-2 viral copies this assay can detect is 250 copies / mL. A negative result does not preclude SARS-CoV-2 infection and should not be used as the sole basis for treatment or other patient management decisions.  A negative result may occur with improper specimen collection  / handling, submission of specimen other than nasopharyngeal swab, presence of viral mutation(s) within the areas targeted by this assay, and inadequate number of viral copies (<250 copies / mL). A negative result must be combined with clinical observations, patient history, and epidemiological information.  Fact Sheet for Patients:   StrictlyIdeas.no  Fact Sheet for Healthcare Providers: BankingDealers.co.za  This test is not yet approved or  cleared by the [place] FDA and has been authorized for detection and/or diagnosis of SARS-CoV-2 by FDA under an Emergency Use Authorization (EUA).  This EUA will remain in effect (meaning this test can be used) for the duration of the COVID-19 declaration under Section 564(b)(1) of the Act, 21 U.S.C. section 360bbb-3(b)(1), unless the authorization is terminated or revoked sooner.  Performed at [facility], [address] [place], [place] [address]   Gastrointestinal Panel by PCR , Stool     Status: None   Collection Time: 09/07/19  3:58 PM   Specimen: Stool  Result Value Ref Range Status   Campylobacter species NOT DETECTED NOT DETECTED Final   Plesimonas shigelloides NOT DETECTED NOT DETECTED Final   Salmonella species NOT DETECTED NOT DETECTED Final   Yersinia enterocolitica NOT DETECTED NOT DETECTED Final   Vibrio species NOT DETECTED NOT DETECTED Final   Vibrio cholerae NOT DETECTED NOT DETECTED Final   Enteroaggregative E coli (EAEC) NOT DETECTED NOT DETECTED Final   Enteropathogenic E coli (EPEC) NOT DETECTED NOT DETECTED Final   Enterotoxigenic E coli (ETEC) NOT DETECTED NOT DETECTED Final   Shiga like toxin producing E coli (STEC) NOT DETECTED NOT DETECTED Final   Shigella/Enteroinvasive E coli (EIEC) NOT DETECTED NOT DETECTED Final   Cryptosporidium NOT DETECTED NOT DETECTED Final   Cyclospora cayetanensis NOT DETECTED NOT DETECTED Final   Entamoeba histolytica NOT  DETECTED NOT DETECTED Final   Giardia lamblia NOT DETECTED NOT DETECTED Final   Adenovirus F40/41 NOT DETECTED NOT DETECTED Final   Astrovirus NOT DETECTED NOT DETECTED Final   Norovirus GI/GII NOT DETECTED NOT DETECTED Final   Rotavirus A NOT DETECTED NOT DETECTED Final   Sapovirus (I, II, IV, and V) NOT DETECTED NOT DETECTED Final    Comment: Performed at [facility], [address] [place], [place] [address]         Radiology Studies: MR BRAIN W WO CONTRAST  Result Date: 09/15/2019 CLINICAL DATA:  Provided history: Metastatic disease evaluation. Additional history provided: Recent diagnosis of rectal cancer, evaluate for metastatic disease. EXAM: MRI HEAD WITHOUT AND WITH CONTRAST TECHNIQUE: Multiplanar, multiecho pulse sequences of the brain and surrounding structures were obtained without and with intravenous contrast. CONTRAST:  46m GADAVIST GADOBUTROL 1 MMOL/ML IV SOLN COMPARISON:  Head CT 10/02/2018, MRI/MRA head 03/21/2018 and 03/22/2018 FINDINGS: Brain: Stable, moderate generalized parenchymal atrophy. There is a punctate focus of restricted diffusion within the left parietal white matter without appreciable corresponding enhancement (series 5, image 29) (series 7, image 9). Redemonstrated chronic  hemorrhagic infarct within the left basal ganglia. Chronic lacunar infarcts within thalami have progressed as compared to the MRI of 03/21/2018. Redemonstrated chronic hemorrhagic infarct within the posterior left midbrain. Chronic infarcts within the pons have progressed. As before, there is background advanced chronic small vessel ischemic disease within the cerebral white matter. Wallerian degeneration of the left cerebral peduncle. No evidence of acute infarct elsewhere within the brain. There are few scattered supratentorial chronic microhemorrhages. No abnormal intracranial enhancement. No extra-axial fluid collection. No midline shift. Vascular: Expected proximal arterial flow  voids. Skull and upper cervical spine: No focal suspicious marrow lesion. There is precontrast T1 hyperintensity within the ventral C3 vertebral body, likely benign. Sinuses/Orbits: Visualized orbits show no acute finding. Mild ethmoid sinus mucosal thickening. No significant mastoid effusion. IMPRESSION: There is a punctate focus of restricted diffusion within the left parietal white matter without appreciable corresponding enhancement. This most likely reflects an acute or early subacute small vessel infarct, although attention is recommended on follow-up. No evidence of intracranial metastatic disease elsewhere. Multiple chronic infarcts as described, some of which are new as compared to the prior MRI of 03/21/2018. Stable background moderate generalized parenchymal atrophy and advanced chronic small vessel ischemic disease. Mild ethmoid sinus mucosal thickening. Electronically Signed   By: Kellie Simmering DO   On: 09/15/2019 15:07   MR PELVIS WO CONTRAST  Result Date: 09/14/2019 CLINICAL DATA:  Rectal mass with potential local invasion based on prior imaging EXAM: MRI PELVIS WITHOUT CONTRAST TECHNIQUE: Multiplanar multisequence MR imaging of the pelvis was performed. No intravenous contrast was administered. Small amount of [place] gel was administered per rectum to optimize tumor evaluation. COMPARISON:  09/13/2019 FINDINGS: TUMOR LOCATION Tumor distance from Anal Verge/Skin Surface:  13.3 cm Tumor distance to Internal Anal Sphincter: 5.9 cm TUMOR DESCRIPTION Circumferential Extent: Circumferential tumor in the upper margin of the tumor. The tumor favors the RIGHT anterolateral rectum and is eccentric towards the inferior margin. Tumor Length: 8.2 cm T - CATEGORY Extension through Muscularis Propria: Yes, approximately 1.5-1.6 cm beyond the confines of the rectum in the axial plane. At least 6-7 mm beyond in the sagittal. Note that bulging of the anterior rectum with a maintained defined T2 hypointense rim is noted  along the RIGHT anterolateral margin. Shortest Distance of any tumor/node from Mesorectal Fascia: 0 mm Extramural Vascular Invasion/Tumor Thrombus: Yes expanded low signal vein arising from an area of extra rectal extension along the LEFT lateral rectal margin (image 20, series 8) also on image 23 of series 9. Expanded vein with low signal also demonstrated on image 31 of series 8 Invasion of Anterior Peritoneal Reflection: Yes Involvement of Adjacent Organs or Pelvic Sidewall: Yes, invasion of the seminal vesicles best seen on image 21 of series 8, tumor passes through the anterior peritoneal reflection at this level extending to the seminal vesicles greatest on the RIGHT just to the RIGHT of midline. Levator Ani Involvement: No N - CATEGORY Mesorectal Lymph Nodes >=52m: N2 with multiple lymph nodes in the mesorectum extending high along the superior rectal vein and into the nodal chains along the inferior IMV as seen on the prior CT. Greater than 4 lymph nodes meet this criteria Extra-mesorectal Lymphadenopathy: Small lymph nodes along venous drainage pathways and lymphatic pathways above the mesorectum, no pelvic sidewall adenopathy. Other: Urinary Tract: Urinary bladder is grossly normal. No signs of distal ureteral dilation. Bowel: As above Vascular/Lymphatic: Lymphatic assessment as described. Reproductive: Seminal vesicle involvement as discussed. Other: None Musculoskeletal: Limited assessment of musculoskeletal  structures is unremarkable. 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 Rectal adenocarcinoma N stage: N2 with superior rectal and nodes about the sacral promontory and just above, bordering on nodes outside of regional nodal stations. Distance from tumor to the internal anal sphincter is 13.3 cm. Electronically Signed   By: Zetta Bills M.D.   On: 09/14/2019 08:18        Scheduled Meds: . amLODipine  10 mg Oral Daily  . amoxicillin  1,000 mg Oral Q12H   . clarithromycin  500 mg Oral Q12H  . feeding supplement (ENSURE ENLIVE)  237 mL Oral TID BM  . finasteride  5 mg Oral Daily  . multivitamin with minerals  1 tablet Oral Daily  . pantoprazole  40 mg Oral BID AC  . tamsulosin  0.4 mg Oral Daily  . vitamin B-12  1,000 mcg Oral Daily   Continuous Infusions: . sodium chloride 250 mL (09/15/19 1025)  . sodium chloride    . ceFAZolin       LOS: 8 days    Time spent: 27 minutes    Deatra James, MD [facility]   To contact the attending provider between 7A-7P or the covering provider during after hours 7P-7A, please log into the web site [email or web address] and access using universal [place] password for that web site. If you do not have the password, please call the hospital operator.  09/15/2019, 3:48 PM

## 2019-09-15 NOTE — Transfer of Care (Signed)
Immediate Anesthesia Transfer of Care Note  Patient: Christopher Delacruz  Procedure(s) Performed: INSERTION PORT-A-CATH (N/A )  Patient Location: PACU  Anesthesia Type:General  Level of Consciousness: sedated and responds to stimulation  Airway & Oxygen Therapy: Patient Spontanous Breathing and Patient connected to face mask oxygen  Post-op Assessment: Report given to RN and Post -op Vital signs reviewed and stable  Post vital signs: Reviewed and stable  Last Vitals:  Vitals Value Taken Time  BP 123/56 09/15/19 1644  Temp    Pulse 59 09/15/19 1644  Resp 15 09/15/19 1644  SpO2 99 % 09/15/19 1644  Vitals shown include unvalidated device data.  Last Pain:  Vitals:   09/15/19 1533  TempSrc:   PainSc: 0-No pain         Complications: No complications documented.

## 2019-09-15 NOTE — Progress Notes (Signed)
OT Cancellation Note  Patient Details Name: Christopher Delacruz MRN: [id number] DOB: Sep 07, 1950   Cancelled Treatment:    Reason Eval/Treat Not Completed: Other (comment). Pt underwent general anesthesia today. No "continue upon transfer" noted. OT will await new orders before proceeding with therapeutic intervention.  Darleen Crocker, [place], OTR/L , CBIS ascom [phone]  09/15/19, 3:32 PM  09/15/2019, 3:29 PM

## 2019-09-15 NOTE — Anesthesia Preprocedure Evaluation (Addendum)
Anesthesia Evaluation  Patient identified by MRN, date of birth, ID band Patient awake    Reviewed: Allergy & Precautions, H&P , NPO status , Patient's Chart, lab work & pertinent test results, reviewed documented beta blocker date and time   Airway Mallampati: II  TM Distance: >3 FB Neck ROM: full    Dental  (+) Upper Dentures, Lower Dentures   Pulmonary neg pulmonary ROS,    Pulmonary exam normal        Cardiovascular Exercise Tolerance: Good hypertension, On Medications negative cardio ROS Normal cardiovascular exam Rate:Normal  02/2018 ECHO IMPRESSIONS    1. The left ventricle has normal systolic function, with an ejection  fraction of 55-60%. The cavity size was normal. There is mildly increased  left ventricular wall thickness. Left ventricular diastolic Doppler  parameters are consistent with impaired  relaxation.  2. The right ventricle has normal systolic function. The cavity was  normal. There is no increase in right ventricular wall thickness.  3. The mitral valve is normal in structure. Mild thickening of the mitral  valve leaflet.  4. The tricuspid valve is normal in structure.  5. The aortic valve is normal in structure. Moderate thickening of the  aortic valve Aortic valve regurgitation is mild by color flow Doppler.   EKG Multiple premature complexes   Neuro/Psych CVA negative neurological ROS  negative psych ROS   GI/Hepatic negative GI ROS, Neg liver ROS,   Endo/Other  negative endocrine ROSdiabetes  Renal/GU Renal diseasenegative Renal ROS  negative genitourinary   Musculoskeletal   Abdominal   Peds  Hematology negative hematology ROS (+) Blood dyscrasia, anemia ,   Anesthesia Other Findings Past Medical History: No date: CVA (cerebral vascular accident) ([place]) No date: Diabetes mellitus ([place]) No date: Hyperlipidemia No date: Hypertension  Past Surgical History: 09/11/2019:  COLONOSCOPY; N/A     Comment:  Procedure: COLONOSCOPY;  Surgeon: [name], MD;                Location: [facility] ENDOSCOPY;  Service: Endoscopy;                Laterality: N/A; 09/12/2019: COLONOSCOPY WITH PROPOFOL; N/A     Comment:  Procedure: COLONOSCOPY WITH PROPOFOL;  Surgeon: [name], MD;  Location: [facility] ENDOSCOPY;  Service:               Gastroenterology;  Laterality: N/A; 09/13/2019: COLONOSCOPY WITH PROPOFOL; N/A     Comment:  Procedure: COLONOSCOPY WITH PROPOFOL;  Surgeon: [name], MD;  Location: [facility] ENDOSCOPY;  Service:               Gastroenterology;  Laterality: N/A; 09/11/2019: ESOPHAGOGASTRODUODENOSCOPY; N/A     Comment:  Procedure: ESOPHAGOGASTRODUODENOSCOPY (EGD);  Surgeon:               [name], MD;  Location: [facility] ENDOSCOPY;  Service:               Endoscopy;  Laterality: N/A; No date: EXPLORE EYE SOCKET  BMI    Body Mass Index: 30.62 kg/m      Reproductive/Obstetrics negative OB ROS                           Anesthesia Physical Anesthesia Plan  ASA: III  Anesthesia Plan: General and General LMA  Post-op Pain Management:    Induction: Intravenous  PONV Risk Score and Plan: Treatment may vary due to age or medical condition and TIVA  Airway Management Planned: Nasal Cannula and Natural Airway  Additional Equipment:   Intra-op Plan:   Post-operative Plan:   Informed Consent: I have reviewed the patients History and Physical, chart, labs and discussed the procedure including the risks, benefits and alternatives for the proposed anesthesia with the patient or authorized representative who has indicated his/her understanding and acceptance.     Dental Advisory Given  Plan Discussed with: CRNA  Anesthesia Plan Comments:        Anesthesia Quick Evaluation

## 2019-09-15 NOTE — TOC Initial Note (Signed)
Transition of Care [facility]) - Initial/Assessment Note    Patient Details  Name: Christopher Delacruz MRN: [id number] Date of Birth: [birth year]  Transition of Care [facility]) CM/SW Contact:    Beverly Sessions, RN Phone Number: 09/15/2019, 3:35 PM  Clinical Narrative:                 Patient admitted from home.  Patient lives at home alone Patient with AMS. Due to AMS there is safety concerns about patient returning home alone.   Nephew Major [place] would like to purse placement at [facility]  Current recommendations Home health.  TOC to await PT recommendations from follow up session  Existing PASRR fl2 sent for signature Bed search initiated   Expected Discharge Plan: Home/Self Care Barriers to Discharge: No Barriers Identified   Patient Goals and CMS Choice        Expected Discharge Plan and Services Expected Discharge Plan: Home/Self Care In-house Referral: Clinical Social Work     Living arrangements for the past 2 months: Single Family Home (duplex)                                      Prior Living Arrangements/Services Living arrangements for the past 2 months: Single Family Home (duplex) Lives with:: Self Patient language and need for interpreter reviewed:: No Do you feel safe going back to the place where you live?: Yes      Need for Family Participation in Patient Care: Yes (Comment) Care giver support system in place?: Yes (comment)   Criminal Activity/Legal Involvement Pertinent to Current Situation/Hospitalization: No - Comment as needed  Activities of Daily Living Home Assistive Devices/Equipment: None ADL Screening (condition at time of admission) Patient's cognitive ability adequate to safely complete daily activities?: Yes Is the patient deaf or have difficulty hearing?: No Does the patient have difficulty seeing, even when wearing glasses/contacts?: No Does the patient have difficulty concentrating, remembering, or making decisions?: Yes Patient able to  express need for assistance with ADLs?: No Does the patient have difficulty dressing or bathing?: No Independently performs ADLs?: Yes (appropriate for developmental age) Does the patient have difficulty walking or climbing stairs?: Yes Weakness of Legs: Right Weakness of Arms/Hands: None  Permission Sought/Granted   Permission granted to share information with : Yes, Verbal Permission Granted        Permission granted to share info w Relationship: Krew Hortman  Permission granted to share info w Contact Information: [phone]  Emotional Assessment         Alcohol / Substance Use: Not Applicable Psych Involvement: No (comment)  Admission diagnosis:  GIB (gastrointestinal bleeding) [K92.2] Upper GI bleed [K92.2] Gastrointestinal hemorrhage, unspecified gastrointestinal hemorrhage type [K92.2] Patient Active Problem List   Diagnosis Date Noted   Rectal cancer ([place])    Colorectal cancer ([place])    Helicobacter pylori gastritis    Intestinal neoplasm    BPH (benign prostatic hyperplasia) 09/07/2019   Acute renal failure superimposed on stage 3a chronic kidney disease ([place]) 09/07/2019   Absolute anemia 09/07/2019   GIB (gastrointestinal bleeding) 09/07/2019   Iron deficiency anemia due to chronic blood loss 09/07/2019   CVA (cerebral vascular accident) ([place]) 03/22/2018   Acute CVA (cerebrovascular accident) ([place]) 03/22/2018   HLD (hyperlipidemia) 03/21/2018   Right sided weakness 06/26/2015   Dysphagia 06/26/2015   Essential hypertension, malignant 06/26/2015   Acute renal insufficiency 06/26/2015   Hypokalemia 06/26/2015   Cerebral  infarction ([place]) 06/23/2015   Diabetes ([place]) 06/23/2015   HTN (hypertension) 06/23/2015   PCP:  Donnie Coffin, MD Pharmacy:   [facility] [place], [place] [address] Phone: [phone] Fax: [phone]  [place] [address]  ([address]), [place] - [place]) [place] [address] Phone: [phone] Fax: [phone]     Social Determinants of Health (SDOH) Interventions    Readmission Risk Interventions No flowsheet data found.

## 2019-09-15 NOTE — Consult Note (Signed)
Psychiatry MD Very brief entry  Began reviewing chart and initially seeing this patient for issues of capacity for consent  Needs to put pieces together and get further history and issues from [place] who is concerned about his wanting to return home to sister but feels that he cannot manage at home due to his issues  Family does not feel he has a full grasp on his illness and what the implications and consequences are of being treated and not treated and also his unrealistic view of being able to care for Self   SNF plus or minus NH pending if plan goes on  Patient has had score of medical and surgical interventions but today is pleasant cooperative oriented to name only --part of date --he is not depressed or mania and is not active suicidal or homicidal ----his rapport is good and his appearance okay.  He has no clouded or fluctuant consciousness and concentration and attention are fair   Will recheck further in am ----as I need to assess more in detail his judgement insight reliability and issues surrounding capacity   Not clear yet what level nephew is doing guardianship vs power of attorney yet   Went over [employer] notes, appreciated thanks

## 2019-09-15 NOTE — Progress Notes (Signed)
Ch visited with Pt and Pt's nephew [name] to notarize AD document. AD was successfully notarized; the original, and a copy given to Pt and nephew. A copy placed on Pt's file, and scanned into system. [name] wanting more information on financial PoA. Palliative care person suggests that an exception could possibly be made for a notary from outside to come to the hospital to complete notarization, given the recently changed visiting policy.

## 2019-09-15 NOTE — Progress Notes (Signed)
[place] visited pt. briefly per page from [facility] and from Pt.'s RN, and as follow-up from visit w/pt. yesterday.  Pt.'s nephew [name] at bedside when [facility] arrived; pt. lying down in bed.  Mark indicated pt. needs both MPOA and DPOA paperwork signed; [place] told him we will follow up as soon as possible w/notary, once DPOA paperwork is completed.

## 2019-09-15 NOTE — Anesthesia Postprocedure Evaluation (Signed)
Anesthesia Post Note  Patient: Janice Seales  Procedure(s) Performed: ESOPHAGOGASTRODUODENOSCOPY (EGD) (N/A ) COLONOSCOPY (N/A )  Patient location during evaluation: Endoscopy Anesthesia Type: General Level of consciousness: awake and alert Pain management: pain level controlled Vital Signs Assessment: post-procedure vital signs reviewed and stable Respiratory status: spontaneous breathing, nonlabored ventilation and respiratory function stable Cardiovascular status: blood pressure returned to baseline and stable Postop Assessment: no apparent nausea or vomiting Anesthetic complications: no   No complications documented.   Last Vitals:  Vitals:   09/14/19 2144 09/15/19 0507  BP: (!) 141/67 134/75  Pulse: 64 65  Resp: 20 20  Temp: 37.1 C 36.8 C  SpO2: 93% 94%    Last Pain:  Vitals:   09/15/19 0507  TempSrc: Oral  PainSc:                  Alphonsus Sias

## 2019-09-15 NOTE — NC FL2 (Signed)
[place] LEVEL OF CARE SCREENING TOOL     IDENTIFICATION  Patient Name: Christopher Delacruz Birthdate: [birth year] Sex: male Admission Date (Current Location): 09/07/2019  [facility] and [place] Number:  Engineering geologist and Address:         Provider Number: [id number]  Attending Physician Name and Address:  Deatra James, MD  Relative Name and Phone Number:       Current Level of Care: Hospital Recommended Level of Care: [place] Prior Approval Number:    Date Approved/Denied:   PASRR Number: [id number]  Discharge Plan: SNF    Current Diagnoses: Patient Active Problem List   Diagnosis Date Noted  . Rectal cancer ([place])   . Colorectal cancer ([place])   . Helicobacter pylori gastritis   . Intestinal neoplasm   . BPH (benign prostatic hyperplasia) 09/07/2019  . Acute renal failure superimposed on stage 3a chronic kidney disease ([place]) 09/07/2019  . Absolute anemia 09/07/2019  . GIB (gastrointestinal bleeding) 09/07/2019  . Iron deficiency anemia due to chronic blood loss 09/07/2019  . CVA (cerebral vascular accident) ([place]) 03/22/2018  . Acute CVA (cerebrovascular accident) ([place]) 03/22/2018  . HLD (hyperlipidemia) 03/21/2018  . Right sided weakness 06/26/2015  . Dysphagia 06/26/2015  . Essential hypertension, malignant 06/26/2015  . Acute renal insufficiency 06/26/2015  . Hypokalemia 06/26/2015  . Cerebral infarction ([place]) 06/23/2015  . Diabetes ([place]) 06/23/2015  . HTN (hypertension) 06/23/2015    Orientation RESPIRATION BLADDER Height & Weight     Self, Place  Normal Incontinent, External catheter Weight: 102.4 kg Height:  6' (182.9 cm)  BEHAVIORAL SYMPTOMS/MOOD NEUROLOGICAL BOWEL NUTRITION STATUS      Incontinent Diet (NPO for procedure, will advance prior to discharge)  AMBULATORY STATUS COMMUNICATION OF NEEDS Skin   Limited Assist Verbally Normal                       Personal Care Assistance Level of Assistance               Functional Limitations Info             SPECIAL CARE FACTORS FREQUENCY  PT (By licensed PT), OT (By licensed OT)                    Contractures      Additional Factors Info  Code Status, Allergies Code Status Info: Full Allergies Info: NKDA           Current Medications (09/15/2019):  This is the current hospital active medication list Current Facility-Administered Medications  Medication Dose Route Frequency Provider Last Rate Last Admin  . 0.9 %  sodium chloride infusion   Intravenous PRN Sharen Hones, MD 10 mL/hr at 09/15/19 1025 250 mL at 09/15/19 1025  . 0.9 %  sodium chloride infusion   Intravenous Continuous Shahmehdi, Seyed A, MD      . acetaminophen (TYLENOL) tablet 650 mg  650 mg Oral Q6H PRN Ivor Costa, MD      . amLODipine (NORVASC) tablet 10 mg  10 mg Oral Daily Ivor Costa, MD   10 mg at 09/14/19 1027  . amoxicillin (AMOXIL) capsule 1,000 mg  1,000 mg Oral Q12H Tylene Fantasia, PA-C   1,000 mg at 09/15/19 1227  . ceFAZolin (ANCEF) 2-4 GM/100ML-% IVPB           . clarithromycin (BIAXIN) tablet 500 mg  500 mg Oral Q12H Edison Simon R, PA-C   500 mg at  09/15/19 1227  . feeding supplement (ENSURE ENLIVE) (ENSURE ENLIVE) liquid 237 mL  237 mL Oral TID BM Sharen Hones, MD   237 mL at 09/14/19 1715  . finasteride (PROSCAR) tablet 5 mg  5 mg Oral Daily Ivor Costa, MD   5 mg at 09/14/19 1027  . LORazepam (ATIVAN) injection 1 mg  1 mg Intravenous Once PRN Shahmehdi, Seyed A, MD      . multivitamin with minerals tablet 1 tablet  1 tablet Oral Daily Fritzi Mandes, MD   1 tablet at 09/13/19 1435  . ondansetron (ZOFRAN) injection 4 mg  4 mg Intravenous Q8H PRN Ivor Costa, MD      . pantoprazole (PROTONIX) EC tablet 40 mg  40 mg Oral BID AC Fritzi Mandes, MD   40 mg at 09/14/19 1715  . tamsulosin (FLOMAX) capsule 0.4 mg  0.4 mg Oral Daily Ivor Costa, MD   0.4 mg at 09/14/19 1027  . vitamin B-12 (CYANOCOBALAMIN) tablet 1,000 mcg  1,000 mcg Oral Daily Fritzi Mandes,  MD   1,000 mcg at 09/14/19 1715     Discharge Medications: Please see discharge summary for a list of discharge medications.  Relevant Imaging Results:  Relevant Lab Results:   Additional Information SSN:  [id number]  Beverly Sessions, RN

## 2019-09-15 NOTE — Progress Notes (Signed)
Nutrition Follow Up Note   DOCUMENTATION CODES:   Obesity unspecified  INTERVENTION:  Ensure Enlive po TID with diet advancement, each supplement provides 350 kcal and 20 grams of protein  MVI daily   NUTRITION DIAGNOSIS:   Inadequate oral intake related to acute illness as evidenced by per patient/family report.  GOAL:   Patient will meet greater than or equal to 90% of their needs  - progressing   MONITOR:   PO intake, Supplement acceptance, Labs, Weight trends, Skin, I & O's  ASSESSMENT:   69 y.o. male with medical history significant of stroke with mild right-sided weakness on Plavix, hypertension, hyperlipidemia, diet-controlled diabetes, upper GI bleeding, BPH, CKD stage III, who presents with rectal incontinence and bloody diarrhea.   Pt s/p EGD 8/17; found to have non-obstructing rectosigmoid colon mass which was positive for adenocarcinoma on biopsy.  Pt advanced to a heart healthy diet 8/17. Pt eating 100% of meals and drinking some Ensure. Plan is for neoadjuvant chemotherapy/XRT. Pt NPO today for port placement.   Medications reviewed and include: amoxicillin, protonix, MVI, B12  Labs reviewed: K 3.9 wnl, Mg 2.1 wnl, creat 1.86(H)  Diet Order:   Diet Order            Diet NPO time specified  Diet effective midnight                EDUCATION NEEDS:   Education needs have been addressed  Skin:  Skin Assessment: Reviewed RN Assessment  Last BM:  8/17  Height:   Ht Readings from Last 1 Encounters:  09/13/19 6' (1.829 m)    Weight:   Wt Readings from Last 1 Encounters:  09/13/19 102.4 kg    Ideal Body Weight:  80.9 kg  BMI:  Body mass index is 30.62 kg/m.  Estimated Nutritional Needs:   Kcal:  2300-2600kcal/day  Protein:  115-130g/day  Fluid:  >2L/day  Koleen Distance MS, RD, LDN Please refer to [facility] for RD and/or RD on-call/weekend/after hours pager

## 2019-09-15 NOTE — Op Note (Signed)
  Pre-operative Diagnosis:Rectal Ca  Post-operative Diagnosis: same   Surgeon: Caroleen Hamman, MD FACS  Anesthesia: IV sedation, marcaine .25% w epi and lidocaine 1%  Procedure: right IJ  Port placement with fluoroscopy under U/S guidance  Findings: Good position of the tip of the catheter by fluoroscopy  Estimated Blood Loss: Minimal         Drains: None         Specimens: None       Complications: none            Procedure Details  The patient was seen again in the Holding Room. The benefits, complications, treatment options, and expected outcomes were discussed with the patient. The risks of bleeding, infection, recurrence of symptoms, failure to resolve symptoms,  thrombosis nonfunction breakage pneumothorax hemopneumothorax any of which could require chest tube or further surgery were reviewed with the patient.   The patient was taken to Operating Room, identified as Christopher Delacruz and the procedure verified.  A Time Out was held and the above information confirmed.  Prior to the induction of general anesthesia, antibiotic prophylaxis was administered. VTE prophylaxis was in place. Appropriate anesthesia was then administered and tolerated well. The chest was prepped with Chloraprep and draped in the sterile fashion. The patient was positioned in the supine position. Then the patient was placed in Trendelenburg position.  Patient was prepped and draped in sterile fashion and in a Trendelenburg position local anesthetic was infiltrated into the skin and subcutaneous tissues in the neck and anterior chest wall. The large bore needle was placed into the internal jugular vein under U/S guidance without difficulty and then the Seldinger wire was advanced. Fluoroscopy was utilized to confirm that the Seldinger wire was in the superior vena cava.  An incision was made and a port pocket developed with blunt and electrocautery dissection. The introducer dilator was placed over the Seldinger  wire the wire was removed. The previously flushed catheter was placed into the introducer dilator and the peel-away sheath was removed. The catheter length was confirmed and trimmed utilizing fluoroscopy for proper positioning. The catheter was then attached to the previously flushed port. The port was placed into the pocket. The port was held in with 2-0 Prolenes and flushed for function and heparin locked.  The wound was closed with interrupted 3-0 Vicryl followed by 4-0 subcuticular Monocryl sutures. Dermabond used to coat the skin  Patient was taken to the recovery room in stable condition where a postoperative chest film has been ordered.

## 2019-09-15 NOTE — Progress Notes (Signed)
Mobility Specialist - Progress Note   09/15/19 1300  Mobility  Activity  (Cancellation)  Mobility performed by Mobility specialist     Per chart review, pt currently having surgery done this afternoon. Will attempt mobility session at a later date/time when appropriate.   Ramata Strothman Mobility Specialist  09/15/19, 1:52 PM

## 2019-09-15 NOTE — Anesthesia Postprocedure Evaluation (Signed)
Anesthesia Post Note  Patient: Christopher Delacruz  Procedure(s) Performed: INSERTION PORT-A-CATH (N/A )  Patient location during evaluation: PACU Anesthesia Type: General Level of consciousness: awake and alert Pain management: pain level controlled Vital Signs Assessment: post-procedure vital signs reviewed and stable Respiratory status: spontaneous breathing, nonlabored ventilation, respiratory function stable and patient connected to nasal cannula oxygen Cardiovascular status: blood pressure returned to baseline and stable Postop Assessment: no apparent nausea or vomiting Anesthetic complications: no   No complications documented.   Last Vitals:  Vitals:   09/15/19 1656 09/15/19 1659  BP: (!) 141/73 (!) 141/73  Pulse: (!) 55 62  Resp: 10 13  Temp:    SpO2: 100% 97%    Last Pain:  Vitals:   09/15/19 1659  TempSrc:   PainSc: 0-No pain                 Molli Barrows

## 2019-09-15 NOTE — Progress Notes (Signed)
PT Cancellation Note  Patient Details Name: Christopher Delacruz MRN: [id number] DOB: 1951-01-27   Cancelled Treatment:    Reason Eval/Treat Not Completed: Patient at procedure or test/unavailable   Alanson Puls, PT DPT 09/15/2019, 2:04 PM

## 2019-09-15 NOTE — Consult Note (Signed)
Consultation Note Date: 09/15/2019   Patient Name: Christopher Delacruz  DOB: [birth year]  MRN: [id number]  Age / Sex: 69 y.o., male  PCP: Donnie Coffin, MD Referring Physician: Deatra James, MD  Reason for Consultation: Establishing goals of care  HPI/Patient Profile: 69 y.o. male  admitted on 09/07/2019 with past medical history significant for remote stroke/right-sided weakness, hypertension, hyperlipidemia, diet-controlled diabetes, BPH, chronic kidney disease admitted through the emergency room for evaluation of rectal incontinence and bloody diarrhea.  Initial hemoglobin was 6.7 patient was transfused.    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  Per family significant cognitive changes over the past few months.  Patient lives alone with poor social support.  Nephew and niece are only family and neither are able to provide 24/7 support.     Patient and family face treatment option decisions, advanced directive decisions and anticipatory care needs.    Clinical Assessment and Goals of Care:  This NP Wadie Lessen reviewed medical records, received report from team, assessed the patient and then meet at the patient's bedside along with nephew/  to discuss diagnosis, prognosis, GOC, EOL wishes disposition and options.   Concept of Palliative Care was introduced as specialized medical care for people and their families living with serious illness.  If focuses on providing relief from the symptoms and stress of a serious illness.  The goal is to improve quality of life for both the patient and the family.  Created space and opportunity for patient and family to explore their thoughts and feeling regarding current medical situation.  Nephew verbalizes great concern regarding  care needs.  Current living situation is tenuous telling me "landlord may ask him to leave".  There is no family member able to be available on a consistent basis care for patient or to get him to appointments.   Education offered today regarding advanced directives.  Concepts specific to code status, artifical feeding and hydration, continued IV antibiotics and rehospitalization was had.  The difference between a aggressive medical intervention path  and a palliative comfort care path for this patient at this time, in this situation  was had.    Values and goals of care important to patient and family were attempted to be elicited.   MOST form introduced   Questions and concerns addressed.  Patient  encouraged to call with questions or concerns.     PMT will continue to support holistically.           Documented today nephew/Christopher Delacruz    SUMMARY OF RECOMMENDATIONS    Code Status/Advance Care Planning:  Full code  Encouraged patient/family to consider DNR/DNI status understanding evidenced based poor outcomes in similar hospitalized patient, as the cause of arrest is likely associated with advanced chronic illness rather than an easily reversible acute cardio-pulmonary event.   Palliative Prophylaxis:   Aspiration, Bowel Regimen, Delirium Protocol, Frequent Pain Assessment and Oral Care  Additional Recommendations (Limitations, Scope,  Preferences):  Full Scope Treatment  Psycho-social/Spiritual:   Desire for further Chaplaincy support:  yes  Additional Recommendations: Education on hospice benefit, recommend OP Palliative service on discahrge  Prognosis:   Unable to determine at this time, clearly it will be dependant on patient's ability to be compliant with tx plan  Discharge Planning: to be deermined     Primary Diagnoses: Present on Admission: . HLD (hyperlipidemia) . HTN (hypertension) . CVA (cerebral vascular accident) ([place]) . BPH (benign prostatic  hyperplasia) . Acute renal failure superimposed on stage 3a chronic kidney disease ([place]) . GIB (gastrointestinal bleeding)   I have reviewed the medical record, interviewed the patient and family, and examined the patient. The following aspects are pertinent.  Past Medical History:  Diagnosis Date  . CVA (cerebral vascular accident) ([place])   . Diabetes mellitus ([place])   . Hyperlipidemia   . Hypertension    Social History   Socioeconomic History  . Marital status: Divorced    Spouse name: Not on file  . Number of children: Not on file  . Years of education: Not on file  . Highest education level: Not on file  Occupational History  . Not on file  Tobacco Use  . Smoking status: Never Smoker  . Smokeless tobacco: Never Used  Vaping Use  . Vaping Use: Never used  Substance and Sexual Activity  . Alcohol use: Never    Alcohol/week: 0.0 standard drinks  . Drug use: Never  . Sexual activity: Not on file  Other Topics Concern  . Not on file  Social History Narrative  . Not on file   Social Determinants of Health   Financial Resource Strain:   . Difficulty of Paying Living Expenses: Not on file  Food Insecurity:   . Worried About Charity fundraiser in the Last Year: Not on file  . Ran Out of Food in the Last Year: Not on file  Transportation Needs:   . Lack of Transportation (Medical): Not on file  . Lack of Transportation (Non-Medical): Not on file  Physical Activity:   . Days of Exercise per Week: Not on file  . Minutes of Exercise per Session: Not on file  Stress:   . Feeling of Stress : Not on file  Social Connections:   . Frequency of Communication with Friends and Family: Not on file  . Frequency of Social Gatherings with Friends and Family: Not on file  . Attends Religious Services: Not on file  . Active Member of Clubs or Organizations: Not on file  . Attends Archivist Meetings: Not on file  . Marital Status: Not on file   Family History  Problem  Relation Age of Onset  . Heart attack Sister   . Leukemia Brother   . Stroke Brother    Scheduled Meds: . amLODipine  10 mg Oral Daily  . amoxicillin  1,000 mg Oral Q12H  . Chlorhexidine Gluconate Cloth  6 each Topical Once  . clarithromycin  500 mg Oral Q12H  . feeding supplement (ENSURE ENLIVE)  237 mL Oral TID BM  . finasteride  5 mg Oral Daily  . multivitamin with minerals  1 tablet Oral Daily  . pantoprazole  40 mg Oral BID AC  . tamsulosin  0.4 mg Oral Daily  . vitamin B-12  1,000 mcg Oral Daily   Continuous Infusions: . sodium chloride 250 mL (09/15/19 1025)   PRN Meds:.sodium chloride, acetaminophen, ondansetron (ZOFRAN) IV Medications Prior to Admission:  Prior  to Admission medications   Medication Sig Start Date End Date Taking? Authorizing Provider  amLODipine (NORVASC) 10 MG tablet Take 1 tablet (10 mg total) by mouth daily. 03/23/18  Yes Demetrios Loll, MD  clopidogrel (PLAVIX) 75 MG tablet Take 1 tablet (75 mg total) by mouth daily. 03/23/18  Yes Demetrios Loll, MD  finasteride (PROSCAR) 5 MG tablet Take 5 mg by mouth daily. 08/29/19  Yes [provider]  tamsulosin (FLOMAX) 0.4 MG CAPS capsule Take 1 capsule (0.4 mg total) by mouth daily. 11/30/17  Yes MacDiarmid, Nicki Reaper, MD   No Known Allergies Review of Systems  All other systems reviewed and are negative.   Physical Exam Constitutional:      Appearance: He is overweight. He is ill-appearing.  Cardiovascular:     Rate and Rhythm: Normal rate.  Pulmonary:     Effort: Pulmonary effort is normal.  Musculoskeletal:     Comments: Generalized weakness  Skin:    General: Skin is warm and dry.  Neurological:     Mental Status: He is alert.     Vital Signs: BP 134/75 (BP Location: Right Arm)   Pulse 65   Temp 98.3 F (36.8 C) (Oral)   Resp 20   Ht 6' (1.829 m)   Wt 102.4 kg   SpO2 94%   BMI 30.62 kg/m  Pain Scale: 0-10   Pain Score: 0-No pain   SpO2: SpO2: 94 % O2 Device:SpO2: 94 % O2 Flow Rate:  .O2 Flow Rate (L/min): 1 L/min  IO: Intake/output summary:   Intake/Output Summary (Last 24 hours) at 09/15/2019 1157 Last data filed at 09/15/2019 0437 Gross per 24 hour  Intake 0 ml  Output 950 ml  Net -950 ml    LBM: Last BM Date: 09/13/19 Baseline Weight: Weight: 113.4 kg Most recent weight: Weight: 102.4 kg     Palliative Assessment/Data:   Discussed with Dr. Roger Shelter via secure chat  Time In: 1400 Time Out: 1510 Time Total: 70 minutes Greater than 50%  of this time was spent counseling and coordinating care related to the above assessment and plan.  Signed by: Wadie Lessen, NP   Please contact Palliative Medicine Team phone at [phone] for questions and concerns.  For individual provider: See Shea Evans

## 2019-09-15 NOTE — Progress Notes (Signed)
Preoperative Review   Patient is met in the preoperative holding area. The history is reviewed in the chart and with the patient. I personally reviewed the options and rationale as well as the risks of this procedure that have been previously discussed with the patient. All questions asked by the patient and/or family were answered to their satisfaction.  Patient agrees to proceed with this procedure at this time.  Demtrius Rounds M.D. FACS   

## 2019-09-15 NOTE — Progress Notes (Signed)
[facility] Hospital Day(s): 8.   Post op day(s): Day of Surgery; Port Placement   Interval History:  Patient seen and examined no acute events or new complaints overnight.  Patient reports he is doing well No fever, chills, nausea, emesis No further melena or hematochezia Labs are pending Plan for port placement this afternnon  Vital signs in last 24 hours: [min-max] current  Temp:  [98.3 F (36.8 C)-98.8 F (37.1 C)] 98.3 F (36.8 C) (08/19 0507) Pulse Rate:  [64-75] 65 (08/19 0507) Resp:  [20] 20 (08/19 0507) BP: (114-141)/(48-75) 134/75 (08/19 0507) SpO2:  [93 %-95 %] 94 % (08/19 0507)     Height: 6' (182.9 cm) Weight: 102.4 kg BMI (Calculated): 30.61   Intake/Output last 2 shifts:  08/18 0701 - 08/19 0700 In: 240 [P.O.:240] Out: 950 [Urine:950]   Physical Exam:  Constitutional: alert, cooperative and no distress  HENT: normocephalic without obvious abnormality  Eyes: PERRL, EOM's grossly intact and symmetric  Respiratory: breathing non-labored at rest  Cardiovascular: regular rate and sinus rhythm  Gastrointestinal: soft, non-tender, and non-distended, no rebound/guarding Musculoskeletal: no edema or wounds, motor and sensation grossly intact, NT   Labs:  CBC Latest Ref Rng & Units 09/13/2019 09/12/2019 09/11/2019  WBC 4.0 - 10.5 K/uL 8.1 8.5 10.1  Hemoglobin 13.0 - 17.0 g/dL 8.9(L) 8.9(L) 9.0(L)  Hematocrit 39 - 52 % 29.8(L) 29.4(L) 30.1(L)  Platelets 150 - 400 K/uL 256 211 254   CMP Latest Ref Rng & Units 09/13/2019 09/12/2019 09/11/2019  Glucose 70 - 99 mg/dL 86 88 86  BUN 8 - 23 mg/dL 8 10 12   Creatinine 0.61 - 1.24 mg/dL 1.43(H) 1.63(H) 1.36(H)  Sodium 135 - 145 mmol/L 140 140 143  Potassium 3.5 - 5.1 mmol/L 4.0 4.4 4.2  Chloride 98 - 111 mmol/L 107 107 110  CO2 22 - 32 mmol/L 25 22 27   Calcium 8.9 - 10.3 mg/dL 8.7(L) 8.9 8.8(L)  Total Protein 6.5 - 8.1 g/dL - - -  Total Bilirubin 0.3 - 1.2 mg/dL - - -  Alkaline  Phos 38 - 126 U/L - - -  AST 15 - 41 U/L - - -  ALT 0 - 44 U/L - - -     Imaging studies: No new pertinent imaging studies   Assessment/Plan:  69 y.o. male with history of GI bleeding found to have non-obstructing rectosigmoid colon mass which was positive for adenocarcinoma on biopsy, plans for neoadjuvant chemotherapy/XRT with port placement today   - Plan for port placement today with Dr Dahlia Byes pending OR/Anethesia availability   - Consent obtained through family members yesterday    - Continue H pylori treatmentwith triple therapy x14 days(Clarithromycin, Amoxicllin + PPI)    - NPO for procedure + IVF resuscitation    - Appreciate oncology assistance and recommendations             - Mointor abdominal examination - Monitor H&H; transfuse as needed >7.0 - Pain control prn; antiemetics prn - Further management per primary service   All of the above findings and recommendations were discussed with the patient, patient's family, and the medical team, and all of patient's and family's questions were answered to their expressed satisfaction.  -- Edison Simon, PA-C [facility] 09/15/2019, 8:54 AM [phone] M-F: 7am - 4pm

## 2019-09-16 ENCOUNTER — Encounter: Payer: Self-pay | Admitting: Surgery

## 2019-09-16 DIAGNOSIS — Z515 Encounter for palliative care: Secondary | ICD-10-CM

## 2019-09-16 DIAGNOSIS — N401 Enlarged prostate with lower urinary tract symptoms: Secondary | ICD-10-CM

## 2019-09-16 DIAGNOSIS — Z7189 Other specified counseling: Secondary | ICD-10-CM

## 2019-09-16 LAB — CBC
HCT: 27.4 % — ABNORMAL LOW (ref 39.0–52.0)
Hemoglobin: 8.3 g/dL — ABNORMAL LOW (ref 13.0–17.0)
MCH: 23.2 pg — ABNORMAL LOW (ref 26.0–34.0)
MCHC: 30.3 g/dL (ref 30.0–36.0)
MCV: 76.5 fL — ABNORMAL LOW (ref 80.0–100.0)
Platelets: 244 10*3/uL (ref 150–400)
RBC: 3.58 MIL/uL — ABNORMAL LOW (ref 4.22–5.81)
RDW: 21.2 % — ABNORMAL HIGH (ref 11.5–15.5)
WBC: 9.8 10*3/uL (ref 4.0–10.5)
nRBC: 0 % (ref 0.0–0.2)

## 2019-09-16 LAB — LIPID PANEL
Cholesterol: 110 mg/dL (ref 0–200)
HDL: 48 mg/dL (ref >40)
LDL Cholesterol: 47 mg/dL (ref 0–99)
Total CHOL/HDL Ratio: 2.3 RATIO
Triglycerides: 73 mg/dL (ref <150)
VLDL: 15 mg/dL (ref 0–40)

## 2019-09-16 LAB — RETIC PANEL
Immature Retic Fract: 31.3 % — ABNORMAL HIGH (ref 2.3–15.9)
RBC.: 3.58 MIL/uL — ABNORMAL LOW (ref 4.22–5.81)
Retic Count, Absolute: 66.6 10*3/uL (ref 19.0–186.0)
Retic Ct Pct: 1.9 % (ref 0.4–3.1)
Reticulocyte Hemoglobin: 31.5 pg (ref >27.9)

## 2019-09-16 LAB — SARS CORONAVIRUS 2 BY RT PCR (HOSPITAL ORDER, PERFORMED IN ~~LOC~~ HOSPITAL LAB): SARS Coronavirus 2: NEGATIVE

## 2019-09-16 MED ORDER — FERROUS SULFATE 325 (65 FE) MG PO TABS: 325.0000 mg | ORAL_TABLET | Freq: Every day | ORAL | 3 refills | Status: DC

## 2019-09-16 MED ORDER — ADULT MULTIVITAMIN W/MINERALS CH: 1.0000 | ORAL_TABLET | Freq: Every day | ORAL | 0 refills | Status: AC

## 2019-09-16 MED ORDER — CYANOCOBALAMIN 1000 MCG PO TABS: 1000.0000 ug | ORAL_TABLET | Freq: Every day | ORAL | 0 refills | Status: AC

## 2019-09-16 MED ORDER — AMOXICILLIN 500 MG PO CAPS: 1000.0000 mg | ORAL_CAPSULE | Freq: Two times a day (BID) | ORAL | 0 refills | Status: AC

## 2019-09-16 MED ORDER — PANTOPRAZOLE SODIUM 40 MG PO TBEC: 40.0000 mg | DELAYED_RELEASE_TABLET | Freq: Two times a day (BID) | ORAL | 0 refills | Status: DC

## 2019-09-16 MED ORDER — SODIUM CHLORIDE 0.9 % IV SOLN
200.0000 mg | Freq: Once | INTRAVENOUS | Status: AC
  Administered 2019-09-16: 200 mg via INTRAVENOUS
  Filled 2019-09-16: qty 10

## 2019-09-16 MED ORDER — CLARITHROMYCIN 500 MG PO TABS: 500.0000 mg | ORAL_TABLET | Freq: Two times a day (BID) | ORAL | 0 refills | Status: AC

## 2019-09-16 MED ORDER — FINASTERIDE 5 MG PO TABS: 5.0000 mg | ORAL_TABLET | Freq: Every day | ORAL | 0 refills | Status: AC

## 2019-09-16 MED ORDER — ASPIRIN 81 MG PO TBEC
81.0000 mg | DELAYED_RELEASE_TABLET | Freq: Every day | ORAL | 11 refills | Status: DC
Start: 2019-09-17 — End: 2020-06-27

## 2019-09-16 NOTE — Care Management Important Message (Signed)
Important Message  Patient Details  Name: Christopher Delacruz MRN: [id number] Date of Birth: 05/22/1950   Medicare Important Message Given:  Yes     Dannette Barbara 09/16/2019, 12:42 PM

## 2019-09-16 NOTE — Progress Notes (Signed)
[facility]) Hospital Liaison RN note:  Received new referral for [facility] to follow post discharge from [facility], TOC.  Patient information given to referral. Will follow for disposition.  Thank you for this referral.  Zandra Abts, RN [facility] Liaison [phone]

## 2019-09-16 NOTE — Progress Notes (Signed)
[facility] Hospital Day(s): 9.   Post op day(s): 1 Day Post-Op.   Interval History:  Patient seen and examined no acute events or new complaints overnight.  Patient resting comfortably, no complaints of pain No new labs this morning No reported issues with port site  Vital signs in last 24 hours: [min-max] current  Temp:  [97 F (36.1 C)-98.7 F (37.1 C)] 98.7 F (37.1 C) (08/20 0514) Pulse Rate:  [51-66] 56 (08/20 0514) Resp:  [10-19] 16 (08/20 0514) BP: (123-165)/(56-86) 165/86 (08/20 0514) SpO2:  [91 %-100 %] 95 % (08/20 0514)     Height: 6' (182.9 cm) Weight: 102.4 kg BMI (Calculated): 30.61   Intake/Output last 2 shifts:  08/19 0701 - 08/20 0700 In: 868.4 [I.V.:818.4; IV Piggyback:50] Out: 500 [Urine:500]   Physical Exam:  Constitutional: alert, cooperative and no distress  Respiratory: breathing non-labored at rest  Cardiovascular: regular rate and sinus rhythm  Gastrointestinal: soft, non-tender, and non-distended Integumentary: port-site in the right chest wall, CDI with dermabond, no erythema or drainage   Labs:  CBC Latest Ref Rng & Units 09/13/2019 09/12/2019 09/11/2019  WBC 4.0 - 10.5 K/uL 8.1 8.5 10.1  Hemoglobin 13.0 - 17.0 g/dL 8.9(L) 8.9(L) 9.0(L)  Hematocrit 39 - 52 % 29.8(L) 29.4(L) 30.1(L)  Platelets 150 - 400 K/uL 256 211 254   CMP Latest Ref Rng & Units 09/15/2019 09/13/2019 09/12/2019  Glucose 70 - 99 mg/dL 91 86 88  BUN 8 - 23 mg/dL 18 8 10   Creatinine 0.61 - 1.24 mg/dL 1.86(H) 1.43(H) 1.63(H)  Sodium 135 - 145 mmol/L 141 140 140  Potassium 3.5 - 5.1 mmol/L 3.9 4.0 4.4  Chloride 98 - 111 mmol/L 109 107 107  CO2 22 - 32 mmol/L 24 25 22   Calcium 8.9 - 10.3 mg/dL 8.5(L) 8.7(L) 8.9  Total Protein 6.5 - 8.1 g/dL - - -  Total Bilirubin 0.3 - 1.2 mg/dL - - -  Alkaline Phos 38 - 126 U/L - - -  AST 15 - 41 U/L - - -  ALT 0 - 44 U/L - - -     Imaging studies: No new pertinent imaging  studies   Assessment/Plan:  69 y.o. male with history of GI bleeding found to have non-obstructing rectosigmoid colon mass which was positive for adenocarcinoma on biopsy 1 Day Post-Op s/p right IJ port placement for neoadjuvant chemotherapy/XRT   - No further general surgery needs at this time. We will sign off; can follow up outpatient in 1-2 weeks to ensure no issues with port  All of the above findings and recommendations were discussed with the patient, and the medical team, and all of patient's questions were answered to his expressed satisfaction.  -- Edison Simon, PA-C [facility] 09/16/2019, 7:34 AM [phone] M-F: 7am - 4pm

## 2019-09-16 NOTE — Discharge Summary (Signed)
Physician Discharge Summary [facility]    Patient: Christopher Delacruz                   Admit date: 09/07/2019   DOB: [birth year]             Discharge date:09/16/2019/1:33 PM [id number]                          PCP: Donnie Coffin, MD  Disposition: SNF  Recommendations for Outpatient Follow-up:   . Follow up: in 1 week  Discharge Condition: Stable   Code Status:   Code Status: Full Code  Diet recommendation: Regular healthy diet   Discharge Diagnoses:    Principal Problem:   GIB (gastrointestinal bleeding) Active Problems:   HTN (hypertension)   Weakness generalized   HLD (hyperlipidemia)   CVA (cerebral vascular accident) ([place])   BPH (benign prostatic hyperplasia)   Acute renal failure superimposed on stage 3a chronic kidney disease (HCC)   Absolute anemia   Iron deficiency anemia due to chronic blood loss   Intestinal neoplasm   Colorectal cancer ([place])   Helicobacter pylori gastritis   Rectal cancer ([place])   Palliative care by specialist   DNR (do not resuscitate) discussion   History of Present Illness/ Hospital Course Christopher Delacruz Summary:    Brief Narrative:  Christopher Delacruz a 69 y.o.malewith medical history significant ofstroke withmildright-sided weakness onPlavix, hypertension, hyperlipidemia, diet-controlled diabetes, upper GI bleeding, BPH, CKD stage III, who presents with rectal incontinenceand bloody diarrhea.  Patient received 2 units of PRBC. Hemoglobin has been stable. GI consult is obtained, scheduled colonoscopy on 8/15.  8/15.EGD showed some erosion in the duodenum. Colonoscopy was a poor prep, suspect colon mass. Repeat colonoscopy is scheduled for tomorrow.  8/16.Poor preparation for colonoscopy today, colonoscopy will be redone tomorrow  8/17.  Colonoscopy performed the third time, tumor marked for surgery.  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  Surgical consult obtained, work-up started,  surgery planned for Friday.  8/18 patient was seen and examined, hemodynamically stable, planning for surgery this Friday  09/15/2019 patient was seen, nephew who is assuming POA present at bedside Planning for port catheter placement today, SW -working on discharge planning likely SNF  09/16/2019 status post Port-A-Cath placement yesterday by general surgery-tolerated well MRI of the brain was negative for any metastasis, revealing multiple small chronic and acute ischemic changes-- Patient has been evaluated PT OT, SNF recommended, patient has been accepted  Acute blood loss anemia secondary to rectal bleeding. Iron deficient anemia and a B12 deficient anemia. -Monitoring-H&H stable  Patient also has significant iron deficiency, was treated with IV iron.   Initiating iron supplement Patient also has B12 deficiency, will give injection of B12.  Rectal cancer, T4b N2 M0 CT images and MRI images were independently reviewed by me and discussed with patient. Small lung nodules 2 to 3 mm bilaterally, which are most likely benign however cannot rule out metastasis.  Nodules are too small for characterization.  I will get the benefit of doubt and assume these are benign nodules.  Wants attention on follow-up images. Recommend neoadjuvant chemotherapy with FOLFOX for 4 months followed by concurrent chemoradiation. Patient has had Mediport placed by surgery. With his performance status, he may benefit from short course of radiation initially to stop bleeding before proceeding with chemotherapy.  -Patient will pursue with chemo radiation therapy Heme oncology Dr. Tasia Catchings -following closely  port a catheter placement today  09/15/2019 -MRI of the brain 09/15/2019-showed punctate focus of restricted diffusion within the left parietal white matter.  Acute or early subacute small vessel infarct.  No evidence of intracranial metastasis.  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  Acute or early subacute small vessel infarct.  No evidence of intracranial metastasis.  Multiple chronic infarcts  -LDL 47 -given patient age and LDL the risk outweighs the benefit for statins therefore no statins -Initiated on aspirin 81 mg along with home medication of Plavix 75 mg daily  H. pylori. Started treatment per surgery. ... Continue for another 10 days, concluding 14 days of treatment Essential hypertension.  Continue home medicines.  Acute kidney injury superimposed on stage IIIa chronic kidney disease. Renal function has improved. 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 [name] active to obtain POA Case management social worker assisting Continue to be involved in patient care  Physical debility -superimposed by cognitive debility Patient continued to decline -Status post PT OT evaluation, now recommending -Anticipating discharge to SNF    DVT prophylaxis:SCDs Code Status:Full Family Communication:None Disposition Plan:  Patient came from:Home(was living alone, physically and mentall declining  Anticipated d/c place:SNF in 1-2 days  Appreciate social work assist  Patient nephew Mr. [name] becoming a POA   Consultants:  GI.  General surgery.  Heme oncology Dr. Tasia Catchings   SARS-CoV-2 screening 09/16/2019 -negative Patient has received first dose of Pfizer vaccine, pending second dose    Nutritional status:  Nutrition Problem: Inadequate oral intake Etiology: acute illness Signs/Symptoms: per patient/family report Interventions: Boost Breeze   Discharge Instructions:   Discharge Instructions     Activity as tolerated - No restrictions   Complete by: As directed    Call MD for:  redness, tenderness, or signs of infection (pain, swelling, redness, odor or green/yellow discharge around incision site)   Complete by: As directed    Call MD for:  temperature >100.4   Complete by: As directed    Diet - low sodium heart healthy   Complete by: As directed    Discharge instructions   Complete by: As directed    Follow-up with Dr. Tasia Catchings closely, will resume chemoradiation therapy   Discharge wound care:   Complete by: As directed    Per wound care-nursing instructions   Increase activity slowly   Complete by: As directed        Medication List    TAKE these medications   amLODipine 10 MG tablet Commonly known as: NORVASC Take 1 tablet (10 mg total) by mouth daily.   amoxicillin 500 MG capsule Commonly known as: AMOXIL Take 2 capsules (1,000 mg total) by mouth every 12 (twelve) hours for 10 days.   aspirin 81 MG EC tablet Take 1 tablet (81 mg total) by mouth daily. Swallow whole. Start taking on: September 17, 2019   clarithromycin 500 MG tablet Commonly known as: BIAXIN Take 1 tablet (500 mg total) by mouth every 12 (twelve) hours for 10 days.   clopidogrel 75 MG tablet Commonly known as: PLAVIX Take 1 tablet (75 mg total) by mouth daily.   cyanocobalamin 1000 MCG tablet Take 1 tablet (1,000 mcg total) by mouth daily.   ferrous sulfate 325 (65 FE) MG tablet Take 1 tablet (325 mg total) by mouth daily.   finasteride 5 MG tablet  Commonly known as: PROSCAR Take 1 tablet (5 mg total) by mouth daily. Start taking on: September 17, 2019   multivitamin with minerals Tabs tablet Take 1 tablet by mouth daily. Start taking on: September 17, 2019   pantoprazole 40 MG tablet Commonly known as: PROTONIX Take 1 tablet (40 mg total) by mouth 2 (two) times daily before a meal for 10 days.   tamsulosin 0.4 MG Caps capsule Commonly known as: FLOMAX Take 1 capsule (0.4 mg total) by  mouth daily.       Contact information for after-discharge care    [facility] .   Service: Skilled Nursing Contact information: [place] [phone]                 No Known Allergies   Procedures /Studies:   CT CHEST W CONTRAST  Result Date: 09/13/2019 CLINICAL DATA:  69 year old male with history of colorectal cancer. Staging examination. EXAM: CT CHEST, ABDOMEN, AND PELVIS WITH CONTRAST TECHNIQUE: Multidetector CT imaging of the chest, abdomen and pelvis was performed following the standard protocol during bolus administration of intravenous contrast. CONTRAST:  19mL OMNIPAQUE IOHEXOL 300 MG/ML  SOLN COMPARISON:  No priors. FINDINGS: CT CHEST FINDINGS Cardiovascular: Heart size is normal. There is no significant pericardial fluid, thickening or pericardial calcification. There is aortic atherosclerosis, as well as atherosclerosis of the great vessels of the mediastinum and the coronary arteries, including calcified atherosclerotic plaque in the ramus intermedius coronary artery. Mediastinum/Nodes: No pathologically enlarged mediastinal or hilar lymph nodes. Esophagus is unremarkable in appearance. No axillary lymphadenopathy. Lungs/Pleura: Dependent areas of subsegmental atelectasis are noted in the lower lobes of the lungs bilaterally. No acute consolidative airspace disease. No pleural effusions. A few scattered tiny 2-3 mm pulmonary nodules are noted in the lungs bilaterally, nonspecific. No larger more suspicious appearing pulmonary nodules or masses are noted. Musculoskeletal: There are no aggressive appearing lytic or blastic lesions noted in the visualized portions of the skeleton. CT ABDOMEN PELVIS FINDINGS Hepatobiliary: No suspicious cystic or solid hepatic lesions. No intra or extrahepatic biliary ductal dilatation. Gallbladder is normal in appearance. Pancreas: No pancreatic mass. No  pancreatic ductal dilatation. No pancreatic or peripancreatic fluid collections or inflammatory changes. Spleen: Unremarkable. Adrenals/Urinary Tract: Tiny subcentimeter low-attenuation lesions in the right kidney, too small to definitively characterize, but statistically likely to represent tiny cysts. Left kidney and bilateral adrenal glands are normal in appearance. No hydroureteronephrosis. Urinary bladder is normal in appearance. Stomach/Bowel: Normal appearance of the stomach. No pathologic dilatation of small bowel or colon. Masslike irregular mural thickening of the rectum which is difficult to discretely measure given its irregular appearance, but is best demonstrated on axial image 116 of series 504, presumably a primary rectal neoplasm. Notably, this obliterates the fat plane in the mesorectal fat between the lesion and the right seminal vesicle (best appreciated on axial image 116 of series 504). The appendix is not confidently identified and may be surgically absent. Regardless, there are no inflammatory changes noted adjacent to the cecum to suggest the presence of an acute appendicitis at this time. Vascular/Lymphatic: Aortic atherosclerosis, without evidence of aneurysm or dissection in the abdominal or pelvic vasculature. Several prominent but nonenlarged mesorectal lymph nodes are noted measuring up to 7 mm in short axis. No other lymphadenopathy noted in the abdomen or pelvis. Reproductive: There is some obscuration of the fat plane between the right seminal vesicle and the rectal mass (discussed above). Prostate  gland and seminal vesicles are otherwise unremarkable in appearance. Other: No significant volume of ascites.  No pneumoperitoneum. Musculoskeletal: There are no aggressive appearing lytic or blastic lesions noted in the visualized portions of the skeleton. IMPRESSION: 1. Rectal mass with evidence of local invasion into the mesorectal fat and potential involvement of the right seminal  vesicle. Multiple borderline enlarged mesorectal lymph nodes. No other definite evidence of distal metastatic disease elsewhere in the abdomen or pelvis. 2. Small pulmonary nodules measuring 2-3 mm in the lungs bilaterally, highly nonspecific and statistically likely benign. However, close attention on follow-up studies is recommended to ensure their stability as metastatic disease is not entirely excluded. 3. Aortic atherosclerosis, in addition to single-vessel coronary artery disease. Assessment for potential risk factor modification, dietary therapy or pharmacologic therapy may be warranted, if clinically indicated. 4. Additional incidental findings, as above. Electronically Signed   By: Vinnie Langton M.D.   On: 09/13/2019 19:56   MR BRAIN W WO CONTRAST  Result Date: 09/15/2019 CLINICAL DATA:  Provided history: Metastatic disease evaluation. Additional history provided: Recent diagnosis of rectal cancer, evaluate for metastatic disease. EXAM: MRI HEAD WITHOUT AND WITH CONTRAST TECHNIQUE: Multiplanar, multiecho pulse sequences of the brain and surrounding structures were obtained without and with intravenous contrast. CONTRAST:  69mL GADAVIST GADOBUTROL 1 MMOL/ML IV SOLN COMPARISON:  Head CT 10/02/2018, MRI/MRA head 03/21/2018 and 03/22/2018 FINDINGS: Brain: Stable, moderate generalized parenchymal atrophy. There is a punctate focus of restricted diffusion within the left parietal white matter without appreciable corresponding enhancement (series 5, image 29) (series 7, image 9). Redemonstrated chronic hemorrhagic infarct within the left basal ganglia. Chronic lacunar infarcts within thalami have progressed as compared to the MRI of 03/21/2018. Redemonstrated chronic hemorrhagic infarct within the posterior left midbrain. Chronic infarcts within the pons have progressed. As before, there is background advanced chronic small vessel ischemic disease within the cerebral white matter. Wallerian degeneration of  the left cerebral peduncle. No evidence of acute infarct elsewhere within the brain. There are few scattered supratentorial chronic microhemorrhages. No abnormal intracranial enhancement. No extra-axial fluid collection. No midline shift. Vascular: Expected proximal arterial flow voids. Skull and upper cervical spine: No focal suspicious marrow lesion. There is precontrast T1 hyperintensity within the ventral C3 vertebral body, likely benign. Sinuses/Orbits: Visualized orbits show no acute finding. Mild ethmoid sinus mucosal thickening. No significant mastoid effusion. IMPRESSION: There is a punctate focus of restricted diffusion within the left parietal white matter without appreciable corresponding enhancement. This most likely reflects an acute or early subacute small vessel infarct, although attention is recommended on follow-up. No evidence of intracranial metastatic disease elsewhere. Multiple chronic infarcts as described, some of which are new as compared to the prior MRI of 03/21/2018. Stable background moderate generalized parenchymal atrophy and advanced chronic small vessel ischemic disease. Mild ethmoid sinus mucosal thickening. Electronically Signed   By: Kellie Simmering DO   On: 09/15/2019 15:07   MR PELVIS WO CONTRAST  Result Date: 09/14/2019 CLINICAL DATA:  Rectal mass with potential local invasion based on prior imaging EXAM: MRI PELVIS WITHOUT CONTRAST TECHNIQUE: Multiplanar multisequence MR imaging of the pelvis was performed. No intravenous contrast was administered. Small amount of [place] gel was administered per rectum to optimize tumor evaluation. COMPARISON:  09/13/2019 FINDINGS: TUMOR LOCATION Tumor distance from Anal Verge/Skin Surface:  13.3 cm Tumor distance to Internal Anal Sphincter: 5.9 cm TUMOR DESCRIPTION Circumferential Extent: Circumferential tumor in the upper margin of the tumor. The tumor favors the RIGHT anterolateral rectum and is eccentric  towards the inferior margin. Tumor  Length: 8.2 cm T - CATEGORY Extension through Muscularis Propria: Yes, approximately 1.5-1.6 cm beyond the confines of the rectum in the axial plane. At least 6-7 mm beyond in the sagittal. Note that bulging of the anterior rectum with a maintained defined T2 hypointense rim is noted along the RIGHT anterolateral margin. Shortest Distance of any tumor/node from Mesorectal Fascia: 0 mm Extramural Vascular Invasion/Tumor Thrombus: Yes expanded low signal vein arising from an area of extra rectal extension along the LEFT lateral rectal margin (image 20, series 8) also on image 23 of series 9. Expanded vein with low signal also demonstrated on image 31 of series 8 Invasion of Anterior Peritoneal Reflection: Yes Involvement of Adjacent Organs or Pelvic Sidewall: Yes, invasion of the seminal vesicles best seen on image 21 of series 8, tumor passes through the anterior peritoneal reflection at this level extending to the seminal vesicles greatest on the RIGHT just to the RIGHT of midline. Levator Ani Involvement: No N - CATEGORY Mesorectal Lymph Nodes >=49mm: N2 with multiple lymph nodes in the mesorectum extending high along the superior rectal vein and into the nodal chains along the inferior IMV as seen on the prior CT. Greater than 4 lymph nodes meet this criteria Extra-mesorectal Lymphadenopathy: Small lymph nodes along venous drainage pathways and lymphatic pathways above the mesorectum, no pelvic sidewall adenopathy. Other: Urinary Tract: Urinary bladder is grossly normal. No signs of distal ureteral dilation. Bowel: As above Vascular/Lymphatic: Lymphatic assessment as described. Reproductive: Seminal vesicle involvement as discussed. Other: None Musculoskeletal: Limited assessment of musculoskeletal structures is unremarkable. 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 Rectal adenocarcinoma N stage: N2 with superior rectal and nodes about the sacral promontory and just above,  bordering on nodes outside of regional nodal stations. Distance from tumor to the internal anal sphincter is 13.3 cm. Electronically Signed   By: Zetta Bills M.D.   On: 09/14/2019 08:18   CT ABDOMEN PELVIS W CONTRAST  Result Date: 09/13/2019 CLINICAL DATA:  69 year old male with history of colorectal cancer. Staging examination. EXAM: CT CHEST, ABDOMEN, AND PELVIS WITH CONTRAST TECHNIQUE: Multidetector CT imaging of the chest, abdomen and pelvis was performed following the standard protocol during bolus administration of intravenous contrast. CONTRAST:  138mL OMNIPAQUE IOHEXOL 300 MG/ML  SOLN COMPARISON:  No priors. FINDINGS: CT CHEST FINDINGS Cardiovascular: Heart size is normal. There is no significant pericardial fluid, thickening or pericardial calcification. There is aortic atherosclerosis, as well as atherosclerosis of the great vessels of the mediastinum and the coronary arteries, including calcified atherosclerotic plaque in the ramus intermedius coronary artery. Mediastinum/Nodes: No pathologically enlarged mediastinal or hilar lymph nodes. Esophagus is unremarkable in appearance. No axillary lymphadenopathy. Lungs/Pleura: Dependent areas of subsegmental atelectasis are noted in the lower lobes of the lungs bilaterally. No acute consolidative airspace disease. No pleural effusions. A few scattered tiny 2-3 mm pulmonary nodules are noted in the lungs bilaterally, nonspecific. No larger more suspicious appearing pulmonary nodules or masses are noted. Musculoskeletal: There are no aggressive appearing lytic or blastic lesions noted in the visualized portions of the skeleton. CT ABDOMEN PELVIS FINDINGS Hepatobiliary: No suspicious cystic or solid hepatic lesions. No intra or extrahepatic biliary ductal dilatation. Gallbladder is normal in appearance. Pancreas: No pancreatic mass. No pancreatic ductal dilatation. No pancreatic or peripancreatic fluid collections or inflammatory changes. Spleen:  Unremarkable. Adrenals/Urinary Tract: Tiny subcentimeter low-attenuation lesions in the right kidney, too small to definitively characterize, but statistically likely to represent tiny  cysts. Left kidney and bilateral adrenal glands are normal in appearance. No hydroureteronephrosis. Urinary bladder is normal in appearance. Stomach/Bowel: Normal appearance of the stomach. No pathologic dilatation of small bowel or colon. Masslike irregular mural thickening of the rectum which is difficult to discretely measure given its irregular appearance, but is best demonstrated on axial image 116 of series 504, presumably a primary rectal neoplasm. Notably, this obliterates the fat plane in the mesorectal fat between the lesion and the right seminal vesicle (best appreciated on axial image 116 of series 504). The appendix is not confidently identified and may be surgically absent. Regardless, there are no inflammatory changes noted adjacent to the cecum to suggest the presence of an acute appendicitis at this time. Vascular/Lymphatic: Aortic atherosclerosis, without evidence of aneurysm or dissection in the abdominal or pelvic vasculature. Several prominent but nonenlarged mesorectal lymph nodes are noted measuring up to 7 mm in short axis. No other lymphadenopathy noted in the abdomen or pelvis. Reproductive: There is some obscuration of the fat plane between the right seminal vesicle and the rectal mass (discussed above). Prostate gland and seminal vesicles are otherwise unremarkable in appearance. Other: No significant volume of ascites.  No pneumoperitoneum. Musculoskeletal: There are no aggressive appearing lytic or blastic lesions noted in the visualized portions of the skeleton. IMPRESSION: 1. Rectal mass with evidence of local invasion into the mesorectal fat and potential involvement of the right seminal vesicle. Multiple borderline enlarged mesorectal lymph nodes. No other definite evidence of distal metastatic  disease elsewhere in the abdomen or pelvis. 2. Small pulmonary nodules measuring 2-3 mm in the lungs bilaterally, highly nonspecific and statistically likely benign. However, close attention on follow-up studies is recommended to ensure their stability as metastatic disease is not entirely excluded. 3. Aortic atherosclerosis, in addition to single-vessel coronary artery disease. Assessment for potential risk factor modification, dietary therapy or pharmacologic therapy may be warranted, if clinically indicated. 4. Additional incidental findings, as above. Electronically Signed   By: Vinnie Langton M.D.   On: 09/13/2019 19:56   DG CHEST PORT 1 VIEW  Result Date: 09/15/2019 CLINICAL DATA:  69 year old male status post Port-A-Cath placement EXAM: PORTABLE CHEST 1 VIEW COMPARISON:  Chest radiograph dated 03/21/2018 and CT dated 09/13/2019 FINDINGS: Right-sided Port-A-Cath with tip at the cavoatrial junction. There is mild cardiomegaly with mild vascular congestion. No focal consolidation or pneumothorax. Trace right pleural effusion may be present. Atherosclerotic calcification of the aorta. No acute osseous pathology. IMPRESSION: Port-A-Cath with tip at the cavoatrial junction. Electronically Signed   By: Anner Crete M.D.   On: 09/15/2019 17:40   DG C-Arm 1-60 Min-No Report  Result Date: 09/15/2019 Fluoroscopy was utilized by the requesting physician.  No radiographic interpretation.    Subjective:   Patient was seen and examined 09/16/2019, 1:33 PM Patient stable today. No acute distress.  No issues overnight Stable for discharge.  Discharge Exam:    Vitals:   09/15/19 1939 09/16/19 0514 09/16/19 1100 09/16/19 1219  BP: 138/85 (!) 165/86  (!) 155/70  Pulse: 66 (!) 56  73  Resp: 17 16  18   Temp: 97.9 F (36.6 C) 98.7 F (37.1 C)  98.6 F (37 C)  TempSrc: Oral Oral  Oral  SpO2: 98% 95% 95% 96%  Weight:      Height:        General: Pt lying comfortably in bed & appears in no  obvious distress. Cardiovascular: S1 & S2 heard, RRR, S1/S2 +. No murmurs, rubs, gallops or clicks.  No JVD or pedal edema. Respiratory: Clear to auscultation without wheezing, rhonchi or crackles. No increased work of breathing. Abdominal:  Non-distended, non-tender & soft. No organomegaly or masses appreciated. Normal bowel sounds heard. CNS: Alert and oriented. No focal deficits. Extremities: no edema, no cyanosis    The results of significant diagnostics from this hospitalization (including imaging, microbiology, ancillary and laboratory) are listed below for reference.      Microbiology:   Recent Results (from the past 240 hour(s))  SARS Coronavirus 2 by RT PCR (hospital order, performed in [facility] hospital lab) Nasopharyngeal Nasopharyngeal Swab     Status: None   Collection Time: 09/07/19  7:22 AM   Specimen: Nasopharyngeal Swab  Result Value Ref Range Status   SARS Coronavirus 2 NEGATIVE NEGATIVE Final    Comment: (NOTE) SARS-CoV-2 target nucleic acids are NOT DETECTED.  The SARS-CoV-2 RNA is generally detectable in upper and lower respiratory specimens during the acute phase of infection. The lowest concentration of SARS-CoV-2 viral copies this assay can detect is 250 copies / mL. A negative result does not preclude SARS-CoV-2 infection and should not be used as the sole basis for treatment or other patient management decisions.  A negative result may occur with improper specimen collection / handling, submission of specimen other than nasopharyngeal swab, presence of viral mutation(s) within the areas targeted by this assay, and inadequate number of viral copies (<250 copies / mL). A negative result must be combined with clinical observations, patient history, and epidemiological information.  Fact Sheet for Patients:   StrictlyIdeas.no  Fact Sheet for Healthcare Providers: BankingDealers.co.za  This test is not yet  approved or  cleared by the [place] FDA and has been authorized for detection and/or diagnosis of SARS-CoV-2 by FDA under an Emergency Use Authorization (EUA).  This EUA will remain in effect (meaning this test can be used) for the duration of the COVID-19 declaration under Section 564(b)(1) of the Act, 21 U.S.C. section 360bbb-3(b)(1), unless the authorization is terminated or revoked sooner.  Performed at [facility], [address] [place], [place] [address]   Gastrointestinal Panel by PCR , Stool     Status: None   Collection Time: 09/07/19  3:58 PM   Specimen: Stool  Result Value Ref Range Status   Campylobacter species NOT DETECTED NOT DETECTED Final   Plesimonas shigelloides NOT DETECTED NOT DETECTED Final   Salmonella species NOT DETECTED NOT DETECTED Final   Yersinia enterocolitica NOT DETECTED NOT DETECTED Final   Vibrio species NOT DETECTED NOT DETECTED Final   Vibrio cholerae NOT DETECTED NOT DETECTED Final   Enteroaggregative E coli (EAEC) NOT DETECTED NOT DETECTED Final   Enteropathogenic E coli (EPEC) NOT DETECTED NOT DETECTED Final   Enterotoxigenic E coli (ETEC) NOT DETECTED NOT DETECTED Final   Shiga like toxin producing E coli (STEC) NOT DETECTED NOT DETECTED Final   Shigella/Enteroinvasive E coli (EIEC) NOT DETECTED NOT DETECTED Final   Cryptosporidium NOT DETECTED NOT DETECTED Final   Cyclospora cayetanensis NOT DETECTED NOT DETECTED Final   Entamoeba histolytica NOT DETECTED NOT DETECTED Final   Giardia lamblia NOT DETECTED NOT DETECTED Final   Adenovirus F40/41 NOT DETECTED NOT DETECTED Final   Astrovirus NOT DETECTED NOT DETECTED Final   Norovirus GI/GII NOT DETECTED NOT DETECTED Final   Rotavirus A NOT DETECTED NOT DETECTED Final   Sapovirus (I, II, IV, and V) NOT DETECTED NOT DETECTED Final    Comment: Performed at [facility], [address] [place],  [place] [address]     Labs:   CBC: Recent Labs  Lab 09/11/19 0936  09/12/19 0747 09/13/19 0535 09/16/19 0905  WBC 10.1 8.5 8.1 9.8  NEUTROABS 7.1 5.6 5.4  --   HGB 9.0* 8.9* 8.9* 8.3*  HCT 30.1* 29.4* 29.8* 27.4*  MCV 74.3* 74.6* 75.3* 76.5*  PLT 254 211 256 086   Basic Metabolic Panel: Recent Labs  Lab 09/11/19 0936 09/12/19 0747 09/13/19 0535 09/15/19 0923  NA 143 140 140 141  K 4.2 4.4 4.0 3.9  CL 110 107 107 109  CO2 27 22 25 24   GLUCOSE 86 88 86 91  BUN 12 10 8 18   CREATININE 1.36* 1.63* 1.43* 1.86*  CALCIUM 8.8* 8.9 8.7* 8.5*  MG 1.9 2.1 2.1  --    Liver Function Tests: No results for input(s): AST, ALT, ALKPHOS, BILITOT, PROT, ALBUMIN in the last 168 hours. BNP (last 3 results) No results for input(s): BNP in the last 8760 hours. Cardiac Enzymes: No results for input(s): CKTOTAL, CKMB, CKMBINDEX, TROPONINI in the last 168 hours. CBG: Recent Labs  Lab 09/13/19 1120 09/15/19 1724  GLUCAP 83 105*   Hgb A1c No results for input(s): HGBA1C in the last 72 hours. Lipid Profile Recent Labs    09/16/19 0905  CHOL 110  HDL 48  LDLCALC 47  TRIG 73  CHOLHDL 2.3   Thyroid function studies No results for input(s): TSH, T4TOTAL, T3FREE, THYROIDAB in the last 72 hours.  Invalid input(s): FREET3 Anemia work up Recent Labs    09/16/19 0905  RETICCTPCT 1.9   Urinalysis    Component Value Date/Time   COLORURINE YELLOW (A) 06/30/2015 0810   APPEARANCEUR Clear 03/25/2019 1354   LABSPEC 1.023 06/30/2015 0810   PHURINE 5.0 06/30/2015 0810   GLUCOSEU Negative 03/25/2019 1354   HGBUR NEGATIVE 06/30/2015 0810   BILIRUBINUR Negative 03/25/2019 1354   KETONESUR NEGATIVE 06/30/2015 0810   PROTEINUR 3+ (A) 03/25/2019 1354   PROTEINUR >500 (A) 06/30/2015 0810   NITRITE Negative 03/25/2019 1354   NITRITE NEGATIVE 06/30/2015 0810   LEUKOCYTESUR Negative 03/25/2019 1354         Time coordinating discharge: Over 45 minutes  SIGNED: Deatra James, MD, FACP, FHM. [facility],  Please use [email or web address] to Page If  7PM-7AM, please contact night-coverage [email or web address] [email or web address] [facility] 09/16/2019, 1:33 PM

## 2019-09-16 NOTE — TOC Transition Note (Signed)
Transition of Care [facility]) - CM/SW Discharge Note   Patient Details  Name: Christopher Delacruz MRN: [id number] Date of Birth: [birth year]  Transition of [facility] CM/SW Contact:  Beverly Sessions, RN Phone Number: 09/16/2019, 2:54 PM   Clinical Narrative:    PT has seen patient today. Recommends SNF.  Nephew and patient in agreement to SNF at discharge.  Bed offers presented.  [facility] selected.  To discharge today. Nephew updated by [facility] and MD  DC info sent in hub to [facility] packet printed  Awaiting repeat covid results before arranging EMS transport    Final next level of care: Skilled Nursing Facility Barriers to Discharge: No Barriers Identified   Patient Goals and CMS Choice        Discharge Placement              Patient chooses bed at: [facility] Patient to be transferred to facility by: EMS Name of family member notified: nephew Patient and family notified of of transfer: 09/16/19  Discharge Plan and Services In-house Referral: Clinical Social Work                                   Social Determinants of Health ([place]) Interventions     Readmission Risk Interventions No flowsheet data found.

## 2019-09-16 NOTE — Final Progress Note (Signed)
Physician Final Progress Note  Patient ID: Christopher Delacruz MRN: [id number] DOB/AGE: Nov 08, 1950 69 y.o.  Admit date: 09/07/2019 Admitting provider: Ivor Costa, MD Discharge date: 09/16/2019   Admission Diagnoses:  See below \\Cognitivve  disorder NOS most likely dementia Psych factors affecting physical illness   Discharge Diagnoses:  Principal Problem:   GIB (gastrointestinal bleeding) Active Problems:   HTN (hypertension)   Weakness generalized   HLD (hyperlipidemia)   CVA (cerebral vascular accident) ([place])   BPH (benign prostatic hyperplasia)   Acute renal failure superimposed on stage 3a chronic kidney disease (HCC)   Absolute anemia   Iron deficiency anemia due to chronic blood loss   Intestinal neoplasm   Colorectal cancer ([place])   Helicobacter pylori gastritis   Rectal cancer ([place])   Palliative care by specialist   DNR (do not resuscitate) discussion   Same     Consults:   Multiple including Psych   Significant Findings/ Diagnostic Studies: {   Not from psych point of view   Procedures:  Family meeting   Discharge Condition: { fair to poor   Disposition: Discharge disposition: 03-Skilled Nursing Facility      Patient does not meet capacity for consent  He does not understand thefull nature of his illnesses and the implications of being treated and not treated  Nephew is assuming medical and financial power of attorney  Patient is technically homeless cannot drive and is heading toward NH after SNF   Oncology et all have to decide further treatments and he does not have capacity to make those decisions   Suggested palliative medicine involvement as well.   Patient's mental status limited He is pleasant oriented to name Mood and affect okay  Not anxious Judgment insight and reliability are all poor  No active SI Hi or plans No psychosis or mania  Rapport and eye contact okay He makes continuously unrealistic goals as an outpatient that are out of  proportion to his condition Cognition, recall fund of knowledge intelligence are all declining Speech normal so far  Thought content and process --wants to   "lead a normal life and will do so on his own " When he has no capacity or means to take care of Self   ADL's limited Gait station and ambulation  Limited No akathisia Cognition decreasing Language English  Musculoskeletal not known No shakes tics tremors Recall diminishing  Sleep on and off  Psychomotor activity okay    Nephew is aware of issues in psych medicine and palliative matters via  Psycho education by phone with me today    No other psych med recommendations for now                           Diet: { as tolerated   Discharge Activity:  Will be I SNF   Discharge Instructions    Activity as tolerated - No restrictions   Complete by: As directed    Call MD for:  redness, tenderness, or signs of infection (pain, swelling, redness, odor or green/yellow discharge around incision site)   Complete by: As directed    Call MD for:  temperature >100.4   Complete by: As directed    Diet - low sodium heart healthy   Complete by: As directed    Discharge instructions   Complete by: As directed    Follow-up with Dr. Tasia Catchings closely, will resume chemoradiation therapy   Discharge wound care:   Complete by: As  directed    Per wound care-nursing instructions   Increase activity slowly   Complete by: As directed      Allergies as of 09/16/2019   No Known Allergies     Medication List    TAKE these medications   amLODipine 10 MG tablet Commonly known as: NORVASC Take 1 tablet (10 mg total) by mouth daily.   amoxicillin 500 MG capsule Commonly known as: AMOXIL Take 2 capsules (1,000 mg total) by mouth every 12 (twelve) hours for 10 days.   aspirin 81 MG EC tablet Take 1 tablet (81 mg total) by mouth daily. Swallow whole. Start taking on: September 17, 2019   clarithromycin 500 MG  tablet Commonly known as: BIAXIN Take 1 tablet (500 mg total) by mouth every 12 (twelve) hours for 10 days.   clopidogrel 75 MG tablet Commonly known as: PLAVIX Take 1 tablet (75 mg total) by mouth daily.   cyanocobalamin 1000 MCG tablet Take 1 tablet (1,000 mcg total) by mouth daily.   ferrous sulfate 325 (65 FE) MG tablet Take 1 tablet (325 mg total) by mouth daily.   finasteride 5 MG tablet Commonly known as: PROSCAR Take 1 tablet (5 mg total) by mouth daily. Start taking on: September 17, 2019   multivitamin with minerals Tabs tablet Take 1 tablet by mouth daily. Start taking on: September 17, 2019   pantoprazole 40 MG tablet Commonly known as: PROTONIX Take 1 tablet (40 mg total) by mouth 2 (two) times daily before a meal for 10 days.   tamsulosin 0.4 MG Caps capsule Commonly known as: FLOMAX Take 1 capsule (0.4 mg total) by mouth daily.            Discharge Care Instructions  (From admission, onward)         Start     Ordered   09/16/19 0000  Discharge wound care:       Comments: Per wound care-nursing instructions   09/16/19 1324          Contact information for after-discharge care    [facility] .   Service: Skilled Nursing Contact information: [place] [phone]                  Total time spent taking care of this patient:  30-40 including meeting with  nephew Signed: [name] 09/16/2019, 5:16 PM

## 2019-09-16 NOTE — Progress Notes (Addendum)
Physical Therapy Treatment Patient Details Name: Christopher Delacruz MRN: [id number] DOB: Jan 04, 1951 Today's Date: 09/16/2019    History of Present Illness Christopher Delacruz is a 69 y.o. male with medical history significant of stroke with mild right-sided weakness on Plavix, hypertension, hyperlipidemia, diet-controlled diabetes, upper GI bleeding, BPH, CKD stage III, who presented with rectal incontinence and bloody diarrhea. Patient called his nephew and told him that he had incontinence of stool and wanted to go to hospital.  Patient has generalized weakness.  Pt had bloody diarrhea in the emergency room with dark-colored blood. Patient had mild lower abdominal pain. No nausea, vomiting.  Patient denies chest pain, shortness breath, cough, fever or chills.  No symptoms of UTI. Patient was found to have positive FOBT. He is currently admitted for GIB with anemia.     PT Comments    Pt was supine in bed with RN in room upon arriving. Pt is alert however disoriented x 2. Unable to correctly state time and situation. He is impulsive at times and has poor insight of deficits/safety awareness. He was able to exit R side of bed with Min assist + vcs/tactile cues for safety. Pt sat EOB x several minutes prior to standing to RW with min assist. Pt was able to ambulate 160 ft however requires a lot of assistance to prevent falling and several standing rest breaks. High fall risk with ambulation due to occasional scissoring, narrow BOS, and poor step quality with RLE. Pt is unsafe to DC home from PT standpoint. He will benefit from SNF at DC to address deficits and improve safe functional mobility. Acute PT will continue to follow. At conclusion of session, pt was long sitting in bed with bed alarm in place, and call bell in reach.       Follow Up Recommendations  SNF     Equipment Recommendations  Other (comment) (defer to next level of care)    Recommendations for Other Services       Precautions /  Restrictions Precautions Precautions: Fall Restrictions Weight Bearing Restrictions: No    Mobility  Bed Mobility Overal bed mobility: Needs Assistance Bed Mobility: Supine to Sit;Sit to Supine     Supine to sit: Min assist Sit to supine: Min assist   General bed mobility comments: Min assist for safety to exit and re-enter bed. Pt slightly impulsive and requires vcs for safety and slowing down  Transfers Overall transfer level: Needs assistance Equipment used: Rolling walker (2 wheeled) Transfers: Sit to/from Stand Sit to Stand: Min assist         General transfer comment: MIn assist to stand from lowest bed height. tactile and verbal cues for technique and safety. Pt does follow commands but requires increased time  Ambulation/Gait Ambulation/Gait assistance: Min guard;Mod assist Gait Distance (Feet): 160 Feet Assistive device: Rolling walker (2 wheeled) Gait Pattern/deviations: Staggering right;Staggering left;Step-to pattern;Decreased step length - right;Scissoring;Narrow base of support Gait velocity: decreased   General Gait Details: Pt was able to ambulate 160 ft with RW however has to have several standing rest 2/2 to fatigue and re-grouping from having episodes of LOB. He is very unsteady and has poor insight of his deficits. Unsafe to ambulate without assistance. several occasions od mod assist to prevent fall   Stairs             Wheelchair Mobility    Modified Rankin (Stroke Patients Only)       Balance Overall balance assessment: Needs assistance Sitting-balance support: No upper extremity supported  Sitting balance-Leahy Scale: Good Sitting balance - Comments: no LOB in sitting   Standing balance support: During functional activity;Bilateral upper extremity supported Standing balance-Leahy Scale: Poor Standing balance comment: Pt remains high fall risk and is very unsteady with gait training.                            Cognition  Arousal/Alertness: Awake/alert Behavior During Therapy: Impulsive;Flat affect Overall Cognitive Status: Impaired/Different from baseline Area of Impairment: Orientation;Attention;Memory;Following commands;Safety/judgement;Awareness;Problem solving                 Orientation Level: Disoriented to;Situation;Time Current Attention Level: Selective Memory: Decreased short-term memory Following Commands: Follows one step commands consistently;Follows one step commands with increased time Safety/Judgement: Decreased awareness of safety;Decreased awareness of deficits Awareness: Intellectual Problem Solving: Slow processing;Difficulty sequencing;Requires verbal cues;Requires tactile cues General Comments: pt was alert but disoriented and slightly confused. Able to answer therapist questions but takes increased time and has inconsistent answers.      Exercises      General Comments        Pertinent Vitals/Pain Pain Assessment: No/denies pain    Home Living                      Prior Function            PT Goals (current goals can now be found in the care plan section) Acute Rehab PT Goals Patient Stated Goal: get out of here Progress towards PT goals: Progressing toward goals    Frequency    Min 2X/week      PT Plan Discharge plan needs to be updated    Co-evaluation              AM-PAC PT "6 Clicks" Mobility   Outcome Measure  Help needed turning from your back to your side while in a flat bed without using bedrails?: None Help needed moving from lying on your back to sitting on the side of a flat bed without using bedrails?: A Little Help needed moving to and from a bed to a chair (including a wheelchair)?: A Little Help needed standing up from a chair using your arms (e.g., wheelchair or bedside chair)?: A Little Help needed to walk in hospital room?: A Lot Help needed climbing 3-5 steps with a railing? : A Lot 6 Click Score: 17    End of  Session Equipment Utilized During Treatment: Gait belt Activity Tolerance: Patient tolerated treatment well Patient left: in bed;with call bell/phone within reach;with bed alarm set Nurse Communication: Mobility status PT Visit Diagnosis: Unsteadiness on feet (R26.81);Muscle weakness (generalized) (M62.81)     Time: 1050-1120 PT Time Calculation (min) (ACUTE ONLY): 30 min  Charges:  $Gait Training: 8-22 mins $Therapeutic Activity: 8-22 mins                     Julaine Fusi PTA 09/16/19, 12:09 PM

## 2019-09-16 NOTE — Progress Notes (Signed)
Christopher Lan  Delacruz and O x 3. VSS. Pt tolerating diet well. No complaints of pain or nausea. IV removed intact. Pt voiced understanding of discharge instructions with no further questions. Pt discharged via EM.   Allergies as of 09/16/2019   No Known Allergies     Medication List    TAKE these medications   amLODipine 10 MG tablet Commonly known as: NORVASC Take 1 tablet (10 mg total) by mouth daily.   amoxicillin 500 MG capsule Commonly known as: AMOXIL Take 2 capsules (1,000 mg total) by mouth every 12 (twelve) hours for 10 days.   aspirin 81 MG EC tablet Take 1 tablet (81 mg total) by mouth daily. Swallow whole. Start taking on: September 17, 2019   clarithromycin 500 MG tablet Commonly known as: BIAXIN Take 1 tablet (500 mg total) by mouth every 12 (twelve) hours for 10 days.   clopidogrel 75 MG tablet Commonly known as: PLAVIX Take 1 tablet (75 mg total) by mouth daily.   cyanocobalamin 1000 MCG tablet Take 1 tablet (1,000 mcg total) by mouth daily.   ferrous sulfate 325 (65 FE) MG tablet Take 1 tablet (325 mg total) by mouth daily.   finasteride 5 MG tablet Commonly known as: PROSCAR Take 1 tablet (5 mg total) by mouth daily. Start taking on: September 17, 2019   multivitamin with minerals Tabs tablet Take 1 tablet by mouth daily. Start taking on: September 17, 2019   pantoprazole 40 MG tablet Commonly known as: PROTONIX Take 1 tablet (40 mg total) by mouth 2 (two) times daily before Delacruz meal for 10 days.   tamsulosin 0.4 MG Caps capsule Commonly known as: FLOMAX Take 1 capsule (0.4 mg total) by mouth daily.            Discharge Care Instructions  (From admission, onward)         Start     Ordered   09/16/19 0000  Discharge wound care:       Comments: Per wound care-nursing instructions   09/16/19 1324          Vitals:   09/16/19 1100 09/16/19 1219  BP:  (!) 155/70  Pulse:  73  Resp:  18  Temp:  98.6 F (37 C)  SpO2: 95% 96%    Francesco Sor

## 2019-09-16 NOTE — Progress Notes (Signed)
Hematology/Oncology Progress Note [facility] Telephone:([phone] [phone]  Patient Care Team: Donnie Coffin, MD as PCP - General (Family Medicine)   Name of the patient: Christopher Delacruz  [id number]  08-20-1950  Date of visit: 09/16/19   INTERVAL HISTORY-  Patient has no new complaints.  He is eating lunch.  Patient knows that he has cancer but not clear what type of cancer.     Patient Active Problem List   Diagnosis Date Noted  . Palliative care by specialist   . DNR (do not resuscitate) discussion   . Rectal cancer ([place])   . Colorectal cancer ([place])   . Helicobacter pylori gastritis   . Intestinal neoplasm   . BPH (benign prostatic hyperplasia) 09/07/2019  . Acute renal failure superimposed on stage 3a chronic kidney disease ([place]) 09/07/2019  . Absolute anemia 09/07/2019  . GIB (gastrointestinal bleeding) 09/07/2019  . Iron deficiency anemia due to chronic blood loss 09/07/2019  . CVA (cerebral vascular accident) ([place]) 03/22/2018  . Acute CVA (cerebrovascular accident) ([place]) 03/22/2018  . HLD (hyperlipidemia) 03/21/2018  . Weakness generalized 06/26/2015  . Dysphagia 06/26/2015  . Essential hypertension, malignant 06/26/2015  . Acute renal insufficiency 06/26/2015  . Hypokalemia 06/26/2015  . Cerebral infarction ([place]) 06/23/2015  . Diabetes ([place]) 06/23/2015  . HTN (hypertension) 06/23/2015     Past Medical History:  Diagnosis Date  . CVA (cerebral vascular accident) ([place])   . Diabetes mellitus ([place])   . Hyperlipidemia   . Hypertension      Past Surgical History:  Procedure Laterality Date  . COLONOSCOPY N/A 09/11/2019   Procedure: COLONOSCOPY;  Surgeon: [name], MD;  Location: [facility] ENDOSCOPY;  Service: Endoscopy;  Laterality: N/A;  . COLONOSCOPY WITH PROPOFOL N/A 09/12/2019   Procedure: COLONOSCOPY WITH PROPOFOL;  Surgeon: [name], MD;  Location: [facility] ENDOSCOPY;  Service: Gastroenterology;  Laterality: N/A;  .  COLONOSCOPY WITH PROPOFOL N/A 09/13/2019   Procedure: COLONOSCOPY WITH PROPOFOL;  Surgeon: [name], MD;  Location: [facility] ENDOSCOPY;  Service: Gastroenterology;  Laterality: N/A;  . ESOPHAGOGASTRODUODENOSCOPY N/A 09/11/2019   Procedure: ESOPHAGOGASTRODUODENOSCOPY (EGD);  Surgeon: [name], MD;  Location: [facility] ENDOSCOPY;  Service: Endoscopy;  Laterality: N/A;  . EXPLORE EYE SOCKET    . PORTACATH PLACEMENT N/A 09/15/2019   Procedure: INSERTION PORT-A-CATH;  Surgeon: Jules Husbands, MD;  Location: [facility] ORS;  Service: General;  Laterality: N/A;    Social History   Socioeconomic History  . Marital status: Divorced    Spouse name: Not on file  . Number of children: Not on file  . Years of education: Not on file  . Highest education level: Not on file  Occupational History  . Not on file  Tobacco Use  . Smoking status: Never Smoker  . Smokeless tobacco: Never Used  Vaping Use  . Vaping Use: Never used  Substance and Sexual Activity  . Alcohol use: Never    Alcohol/week: 0.0 standard drinks  . Drug use: Never  . Sexual activity: Not on file  Other Topics Concern  . Not on file  Social History Narrative  . Not on file   Social Determinants of Health   Financial Resource Strain:   . Difficulty of Paying Living Expenses: Not on file  Food Insecurity:   . Worried About Charity fundraiser in the Last Year: Not on file  . Ran Out of Food in the Last Year: Not on file  Transportation Needs:   . Lack of Transportation (Medical):  Not on file  . Lack of Transportation (Non-Medical): Not on file  Physical Activity:   . Days of Exercise per Week: Not on file  . Minutes of Exercise per Session: Not on file  Stress:   . Feeling of Stress : Not on file  Social Connections:   . Frequency of Communication with Friends and Family: Not on file  . Frequency of Social Gatherings with Friends and Family: Not on file  . Attends Religious Services: Not on file  . Active Member of Clubs or  Organizations: Not on file  . Attends Archivist Meetings: Not on file  . Marital Status: Not on file  Intimate Partner Violence:   . Fear of Current or Ex-Partner: Not on file  . Emotionally Abused: Not on file  . Physically Abused: Not on file  . Sexually Abused: Not on file     Family History  Problem Relation Age of Onset  . Heart attack Sister   . Leukemia Brother   . Stroke Brother      Current Facility-Administered Medications:  .  0.9 %  sodium chloride infusion, , Intravenous, PRN, Pabon, Diego F, MD, Last Rate: 10 mL/hr at 09/15/19 1544, Restarted at 09/15/19 1643 .  acetaminophen (TYLENOL) tablet 650 mg, 650 mg, Oral, Q4H PRN, Pabon, Diego F, MD .  amLODipine (NORVASC) tablet 10 mg, 10 mg, Oral, Daily, Pabon, Diego F, MD, 10 mg at 09/16/19 1046 .  amoxicillin (AMOXIL) capsule 1,000 mg, 1,000 mg, Oral, Q12H, Pabon, Diego F, MD, 1,000 mg at 09/16/19 1046 .  aspirin EC tablet 81 mg, 81 mg, Oral, Daily, Shahmehdi, Seyed A, MD, 81 mg at 09/16/19 1046 .  clarithromycin (BIAXIN) tablet 500 mg, 500 mg, Oral, Q12H, Pabon, Diego F, MD, 500 mg at 09/16/19 1046 .  feeding supplement (ENSURE ENLIVE) (ENSURE ENLIVE) liquid 237 mL, 237 mL, Oral, TID BM, Pabon, Diego F, MD, 237 mL at 09/16/19 1046 .  finasteride (PROSCAR) tablet 5 mg, 5 mg, Oral, Daily, Pabon, Diego F, MD, 5 mg at 09/16/19 1046 .  iron sucrose (VENOFER) 200 mg in sodium chloride 0.9 % 100 mL IVPB, 200 mg, Intravenous, Once, [name], MD .  LORazepam (ATIVAN) injection 1 mg, 1 mg, Intravenous, Once PRN, Pabon, Diego F, MD .  multivitamin with minerals tablet 1 tablet, 1 tablet, Oral, Daily, Pabon, Diego F, MD, 1 tablet at 09/16/19 1213 .  ondansetron (ZOFRAN) injection 4 mg, 4 mg, Intravenous, Q8H PRN, Pabon, Diego F, MD .  oxyCODONE (Oxy IR/ROXICODONE) immediate release tablet 5 mg, 5 mg, Oral, Q3H PRN, Pabon, Diego F, MD .  pantoprazole (PROTONIX) EC tablet 40 mg, 40 mg, Oral, BID AC, Pabon, Diego F, MD, 40 mg  at 09/16/19 0853 .  tamsulosin (FLOMAX) capsule 0.4 mg, 0.4 mg, Oral, Daily, Pabon, Diego F, MD, 0.4 mg at 09/16/19 1046 .  vitamin B-12 (CYANOCOBALAMIN) tablet 1,000 mcg, 1,000 mcg, Oral, Daily, Pabon, [place] F, MD, 1,000 mcg at 09/14/19 1715   Physical exam:  Vitals:   09/15/19 1939 09/16/19 0514 09/16/19 1100 09/16/19 1219  BP: 138/85 (!) 165/86  (!) 155/70  Pulse: 66 (!) 56  73  Resp: 17 16  18   Temp: 97.9 F (36.6 C) 98.7 F (37.1 C)  98.6 F (37 C)  TempSrc: Oral Oral  Oral  SpO2: 98% 95% 95% 96%  Weight:      Height:       Physical Exam Constitutional:      General: He is  not in acute distress.    Appearance: He is not diaphoretic.  HENT:     Head: Normocephalic and atraumatic.     Nose: Nose normal.     Mouth/Throat:     Pharynx: No oropharyngeal exudate.  Eyes:     General: No scleral icterus.    Pupils: Pupils are equal, round, and reactive to light.  Cardiovascular:     Rate and Rhythm: Normal rate and regular rhythm.     Heart sounds: No murmur heard.   Pulmonary:     Effort: Pulmonary effort is normal. No respiratory distress.     Breath sounds: No rales.  Chest:     Chest wall: No tenderness.  Abdominal:     General: There is no distension.     Palpations: Abdomen is soft.     Tenderness: There is no abdominal tenderness.  Musculoskeletal:        General: Normal range of motion.     Cervical back: Normal range of motion and neck supple.  Skin:    General: Skin is warm and dry.     Findings: No erythema.  Neurological:     Mental Status: He is alert. Mental status is at baseline.     Cranial Nerves: No cranial nerve deficit.     Motor: No abnormal muscle tone.     Coordination: Coordination normal.     Comments: Orientated x2  Psychiatric:        Mood and Affect: Affect normal.        CMP Latest Ref Rng & Units 09/15/2019  Glucose 70 - 99 mg/dL 91  BUN 8 - 23 mg/dL 18  Creatinine 0.61 - 1.24 mg/dL 1.86(H)  Sodium 135 - 145 mmol/L 141    Potassium 3.5 - 5.1 mmol/L 3.9  Chloride 98 - 111 mmol/L 109  CO2 22 - 32 mmol/L 24  Calcium 8.9 - 10.3 mg/dL 8.5(L)  Total Protein 6.5 - 8.1 g/dL -  Total Bilirubin 0.3 - 1.2 mg/dL -  Alkaline Phos 38 - 126 U/L -  AST 15 - 41 U/L -  ALT 0 - 44 U/L -   CBC Latest Ref Rng & Units 09/16/2019  WBC 4.0 - 10.5 K/uL 9.8  Hemoglobin 13.0 - 17.0 g/dL 8.3(L)  Hematocrit 39 - 52 % 27.4(L)  Platelets 150 - 400 K/uL 244    RADIOGRAPHIC STUDIES: I have personally reviewed the radiological images as listed and agreed with the findings in the report. CT CHEST W CONTRAST  Result Date: 09/13/2019 CLINICAL DATA:  69 year old male with history of colorectal cancer. Staging examination. EXAM: CT CHEST, ABDOMEN, AND PELVIS WITH CONTRAST TECHNIQUE: Multidetector CT imaging of the chest, abdomen and pelvis was performed following the standard protocol during bolus administration of intravenous contrast. CONTRAST:  16mL OMNIPAQUE IOHEXOL 300 MG/ML  SOLN COMPARISON:  No priors. FINDINGS: CT CHEST FINDINGS Cardiovascular: Heart size is normal. There is no significant pericardial fluid, thickening or pericardial calcification. There is aortic atherosclerosis, as well as atherosclerosis of the great vessels of the mediastinum and the coronary arteries, including calcified atherosclerotic plaque in the ramus intermedius coronary artery. Mediastinum/Nodes: No pathologically enlarged mediastinal or hilar lymph nodes. Esophagus is unremarkable in appearance. No axillary lymphadenopathy. Lungs/Pleura: Dependent areas of subsegmental atelectasis are noted in the lower lobes of the lungs bilaterally. No acute consolidative airspace disease. No pleural effusions. A few scattered tiny 2-3 mm pulmonary nodules are noted in the lungs bilaterally, nonspecific. No larger more suspicious appearing pulmonary nodules  or masses are noted. Musculoskeletal: There are no aggressive appearing lytic or blastic lesions noted in the visualized  portions of the skeleton. CT ABDOMEN PELVIS FINDINGS Hepatobiliary: No suspicious cystic or solid hepatic lesions. No intra or extrahepatic biliary ductal dilatation. Gallbladder is normal in appearance. Pancreas: No pancreatic mass. No pancreatic ductal dilatation. No pancreatic or peripancreatic fluid collections or inflammatory changes. Spleen: Unremarkable. Adrenals/Urinary Tract: Tiny subcentimeter low-attenuation lesions in the right kidney, too small to definitively characterize, but statistically likely to represent tiny cysts. Left kidney and bilateral adrenal glands are normal in appearance. No hydroureteronephrosis. Urinary bladder is normal in appearance. Stomach/Bowel: Normal appearance of the stomach. No pathologic dilatation of small bowel or colon. Masslike irregular mural thickening of the rectum which is difficult to discretely measure given its irregular appearance, but is best demonstrated on axial image 116 of series 504, presumably a primary rectal neoplasm. Notably, this obliterates the fat plane in the mesorectal fat between the lesion and the right seminal vesicle (best appreciated on axial image 116 of series 504). The appendix is not confidently identified and may be surgically absent. Regardless, there are no inflammatory changes noted adjacent to the cecum to suggest the presence of an acute appendicitis at this time. Vascular/Lymphatic: Aortic atherosclerosis, without evidence of aneurysm or dissection in the abdominal or pelvic vasculature. Several prominent but nonenlarged mesorectal lymph nodes are noted measuring up to 7 mm in short axis. No other lymphadenopathy noted in the abdomen or pelvis. Reproductive: There is some obscuration of the fat plane between the right seminal vesicle and the rectal mass (discussed above). Prostate gland and seminal vesicles are otherwise unremarkable in appearance. Other: No significant volume of ascites.  No pneumoperitoneum. Musculoskeletal: There  are no aggressive appearing lytic or blastic lesions noted in the visualized portions of the skeleton. IMPRESSION: 1. Rectal mass with evidence of local invasion into the mesorectal fat and potential involvement of the right seminal vesicle. Multiple borderline enlarged mesorectal lymph nodes. No other definite evidence of distal metastatic disease elsewhere in the abdomen or pelvis. 2. Small pulmonary nodules measuring 2-3 mm in the lungs bilaterally, highly nonspecific and statistically likely benign. However, close attention on follow-up studies is recommended to ensure their stability as metastatic disease is not entirely excluded. 3. Aortic atherosclerosis, in addition to single-vessel coronary artery disease. Assessment for potential risk factor modification, dietary therapy or pharmacologic therapy may be warranted, if clinically indicated. 4. Additional incidental findings, as above. Electronically Signed   By: Vinnie Langton M.D.   On: 09/13/2019 19:56   MR BRAIN W WO CONTRAST  Result Date: 09/15/2019 CLINICAL DATA:  Provided history: Metastatic disease evaluation. Additional history provided: Recent diagnosis of rectal cancer, evaluate for metastatic disease. EXAM: MRI HEAD WITHOUT AND WITH CONTRAST TECHNIQUE: Multiplanar, multiecho pulse sequences of the brain and surrounding structures were obtained without and with intravenous contrast. CONTRAST:  59mL GADAVIST GADOBUTROL 1 MMOL/ML IV SOLN COMPARISON:  Head CT 10/02/2018, MRI/MRA head 03/21/2018 and 03/22/2018 FINDINGS: Brain: Stable, moderate generalized parenchymal atrophy. There is a punctate focus of restricted diffusion within the left parietal white matter without appreciable corresponding enhancement (series 5, image 29) (series 7, image 9). Redemonstrated chronic hemorrhagic infarct within the left basal ganglia. Chronic lacunar infarcts within thalami have progressed as compared to the MRI of 03/21/2018. Redemonstrated chronic hemorrhagic  infarct within the posterior left midbrain. Chronic infarcts within the pons have progressed. As before, there is background advanced chronic small vessel ischemic disease within the cerebral white matter.  Wallerian degeneration of the left cerebral peduncle. No evidence of acute infarct elsewhere within the brain. There are few scattered supratentorial chronic microhemorrhages. No abnormal intracranial enhancement. No extra-axial fluid collection. No midline shift. Vascular: Expected proximal arterial flow voids. Skull and upper cervical spine: No focal suspicious marrow lesion. There is precontrast T1 hyperintensity within the ventral C3 vertebral body, likely benign. Sinuses/Orbits: Visualized orbits show no acute finding. Mild ethmoid sinus mucosal thickening. No significant mastoid effusion. IMPRESSION: There is a punctate focus of restricted diffusion within the left parietal white matter without appreciable corresponding enhancement. This most likely reflects an acute or early subacute small vessel infarct, although attention is recommended on follow-up. No evidence of intracranial metastatic disease elsewhere. Multiple chronic infarcts as described, some of which are new as compared to the prior MRI of 03/21/2018. Stable background moderate generalized parenchymal atrophy and advanced chronic small vessel ischemic disease. Mild ethmoid sinus mucosal thickening. Electronically Signed   By: Kellie Simmering DO   On: 09/15/2019 15:07   MR PELVIS WO CONTRAST  Result Date: 09/14/2019 CLINICAL DATA:  Rectal mass with potential local invasion based on prior imaging EXAM: MRI PELVIS WITHOUT CONTRAST TECHNIQUE: Multiplanar multisequence MR imaging of the pelvis was performed. No intravenous contrast was administered. Small amount of [place] gel was administered per rectum to optimize tumor evaluation. COMPARISON:  09/13/2019 FINDINGS: TUMOR LOCATION Tumor distance from Anal Verge/Skin Surface:  13.3 cm Tumor distance to  Internal Anal Sphincter: 5.9 cm TUMOR DESCRIPTION Circumferential Extent: Circumferential tumor in the upper margin of the tumor. The tumor favors the RIGHT anterolateral rectum and is eccentric towards the inferior margin. Tumor Length: 8.2 cm T - CATEGORY Extension through Muscularis Propria: Yes, approximately 1.5-1.6 cm beyond the confines of the rectum in the axial plane. At least 6-7 mm beyond in the sagittal. Note that bulging of the anterior rectum with a maintained defined T2 hypointense rim is noted along the RIGHT anterolateral margin. Shortest Distance of any tumor/node from Mesorectal Fascia: 0 mm Extramural Vascular Invasion/Tumor Thrombus: Yes expanded low signal vein arising from an area of extra rectal extension along the LEFT lateral rectal margin (image 20, series 8) also on image 23 of series 9. Expanded vein with low signal also demonstrated on image 31 of series 8 Invasion of Anterior Peritoneal Reflection: Yes Involvement of Adjacent Organs or Pelvic Sidewall: Yes, invasion of the seminal vesicles best seen on image 21 of series 8, tumor passes through the anterior peritoneal reflection at this level extending to the seminal vesicles greatest on the RIGHT just to the RIGHT of midline. Levator Ani Involvement: No N - CATEGORY Mesorectal Lymph Nodes >=54mm: N2 with multiple lymph nodes in the mesorectum extending high along the superior rectal vein and into the nodal chains along the inferior IMV as seen on the prior CT. Greater than 4 lymph nodes meet this criteria Extra-mesorectal Lymphadenopathy: Small lymph nodes along venous drainage pathways and lymphatic pathways above the mesorectum, no pelvic sidewall adenopathy. Other: Urinary Tract: Urinary bladder is grossly normal. No signs of distal ureteral dilation. Bowel: As above Vascular/Lymphatic: Lymphatic assessment as described. Reproductive: Seminal vesicle involvement as discussed. Other: None Musculoskeletal: Limited assessment of  musculoskeletal structures is unremarkable. 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 Rectal adenocarcinoma N stage: N2 with superior rectal and nodes about the sacral promontory and just above, bordering on nodes outside of regional nodal stations. Distance from tumor to the internal anal sphincter is 13.3 cm. Electronically Signed  By: Zetta Bills M.D.   On: 09/14/2019 08:18   CT ABDOMEN PELVIS W CONTRAST  Result Date: 09/13/2019 CLINICAL DATA:  69 year old male with history of colorectal cancer. Staging examination. EXAM: CT CHEST, ABDOMEN, AND PELVIS WITH CONTRAST TECHNIQUE: Multidetector CT imaging of the chest, abdomen and pelvis was performed following the standard protocol during bolus administration of intravenous contrast. CONTRAST:  139mL OMNIPAQUE IOHEXOL 300 MG/ML  SOLN COMPARISON:  No priors. FINDINGS: CT CHEST FINDINGS Cardiovascular: Heart size is normal. There is no significant pericardial fluid, thickening or pericardial calcification. There is aortic atherosclerosis, as well as atherosclerosis of the great vessels of the mediastinum and the coronary arteries, including calcified atherosclerotic plaque in the ramus intermedius coronary artery. Mediastinum/Nodes: No pathologically enlarged mediastinal or hilar lymph nodes. Esophagus is unremarkable in appearance. No axillary lymphadenopathy. Lungs/Pleura: Dependent areas of subsegmental atelectasis are noted in the lower lobes of the lungs bilaterally. No acute consolidative airspace disease. No pleural effusions. A few scattered tiny 2-3 mm pulmonary nodules are noted in the lungs bilaterally, nonspecific. No larger more suspicious appearing pulmonary nodules or masses are noted. Musculoskeletal: There are no aggressive appearing lytic or blastic lesions noted in the visualized portions of the skeleton. CT ABDOMEN PELVIS FINDINGS Hepatobiliary: No suspicious cystic or solid hepatic lesions. No intra or  extrahepatic biliary ductal dilatation. Gallbladder is normal in appearance. Pancreas: No pancreatic mass. No pancreatic ductal dilatation. No pancreatic or peripancreatic fluid collections or inflammatory changes. Spleen: Unremarkable. Adrenals/Urinary Tract: Tiny subcentimeter low-attenuation lesions in the right kidney, too small to definitively characterize, but statistically likely to represent tiny cysts. Left kidney and bilateral adrenal glands are normal in appearance. No hydroureteronephrosis. Urinary bladder is normal in appearance. Stomach/Bowel: Normal appearance of the stomach. No pathologic dilatation of small bowel or colon. Masslike irregular mural thickening of the rectum which is difficult to discretely measure given its irregular appearance, but is best demonstrated on axial image 116 of series 504, presumably a primary rectal neoplasm. Notably, this obliterates the fat plane in the mesorectal fat between the lesion and the right seminal vesicle (best appreciated on axial image 116 of series 504). The appendix is not confidently identified and may be surgically absent. Regardless, there are no inflammatory changes noted adjacent to the cecum to suggest the presence of an acute appendicitis at this time. Vascular/Lymphatic: Aortic atherosclerosis, without evidence of aneurysm or dissection in the abdominal or pelvic vasculature. Several prominent but nonenlarged mesorectal lymph nodes are noted measuring up to 7 mm in short axis. No other lymphadenopathy noted in the abdomen or pelvis. Reproductive: There is some obscuration of the fat plane between the right seminal vesicle and the rectal mass (discussed above). Prostate gland and seminal vesicles are otherwise unremarkable in appearance. Other: No significant volume of ascites.  No pneumoperitoneum. Musculoskeletal: There are no aggressive appearing lytic or blastic lesions noted in the visualized portions of the skeleton. IMPRESSION: 1. Rectal  mass with evidence of local invasion into the mesorectal fat and potential involvement of the right seminal vesicle. Multiple borderline enlarged mesorectal lymph nodes. No other definite evidence of distal metastatic disease elsewhere in the abdomen or pelvis. 2. Small pulmonary nodules measuring 2-3 mm in the lungs bilaterally, highly nonspecific and statistically likely benign. However, close attention on follow-up studies is recommended to ensure their stability as metastatic disease is not entirely excluded. 3. Aortic atherosclerosis, in addition to single-vessel coronary artery disease. Assessment for potential risk factor modification, dietary therapy or pharmacologic therapy may be warranted,  if clinically indicated. 4. Additional incidental findings, as above. Electronically Signed   By: Vinnie Langton M.D.   On: 09/13/2019 19:56   DG CHEST PORT 1 VIEW  Result Date: 09/15/2019 CLINICAL DATA:  69 year old male status post Port-A-Cath placement EXAM: PORTABLE CHEST 1 VIEW COMPARISON:  Chest radiograph dated 03/21/2018 and CT dated 09/13/2019 FINDINGS: Right-sided Port-A-Cath with tip at the cavoatrial junction. There is mild cardiomegaly with mild vascular congestion. No focal consolidation or pneumothorax. Trace right pleural effusion may be present. Atherosclerotic calcification of the aorta. No acute osseous pathology. IMPRESSION: Port-A-Cath with tip at the cavoatrial junction. Electronically Signed   By: Anner Crete M.D.   On: 09/15/2019 17:40   DG C-Arm 1-60 Min-No Report  Result Date: 09/15/2019 Fluoroscopy was utilized by the requesting physician.  No radiographic interpretation.    Assessment and plan-  #Rectal cancer, T4b N2 M0 CT images and MRI images were independently reviewed by me and discussed with patient. Small lung nodules 2 to 3 mm bilaterally, which are most likely benign however cannot rule out metastasis.  Nodules are too small for characterization.  I will get the  benefit of doubt and assume these are benign nodules.  Wants attention on follow-up images. Recommend neoadjuvant chemotherapy with FOLFOX for 4 months followed by concurrent chemoradiation. Patient has had Mediport placed by surgery. With his performance status, he may benefit from short course of radiation initially to stop bleeding before proceeding with chemotherapy.  #Vitamin B12 deficiency, continue vitamin B12 supplementation #Iron deficiency anemia, hemoglobin is 8.3, MCV 76.5.  Patient has received PRBC transfusion and IV Venofer treatments during this hospitalization.  I will order another dose of Venofer to be given today considering his ongoing blood loss from GI tract.  He will need to be followed up closely outpatient for possible need of IV Venofer treatments as well as blood transfusion. #  H. pylori associated gastritis.  Patient has been started on H. pylori treatment #Cognitive debility, he appears quite forgetful, and has poor insights of his cancer diagnosis and future treatment plans.  MRI brain showed punctate focus of restricted diffusion within the left parietal white matter.  Acute or early subacute small vessel infarct.  No evidence of intracranial metastasis.  Multiple chronic infarcts.  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  B12 deficiency may also contribute to his mental status. Psychiatry has been consulted and patient has been evaluated.  Appreciate input. Disposition, there is some concern about patient safety of living at home by himself.  Possible rehab placement.  Thank you for allowing me to participate in the care of this patient.   [name], MD, PhD Hematology Oncology [facility] Pager- [phone] 09/16/2019

## 2019-09-17 ENCOUNTER — Encounter: Payer: Self-pay | Admitting: Oncology

## 2019-09-17 ENCOUNTER — Other Ambulatory Visit: Payer: Self-pay | Admitting: Oncology

## 2019-09-17 DIAGNOSIS — E538 Deficiency of other specified B group vitamins: Secondary | ICD-10-CM | POA: Insufficient documentation

## 2019-09-17 HISTORY — DX: Deficiency of other specified B group vitamins: E53.8

## 2019-09-19 ENCOUNTER — Other Ambulatory Visit: Payer: Self-pay

## 2019-09-19 ENCOUNTER — Telehealth: Payer: Self-pay

## 2019-09-19 DIAGNOSIS — C20 Malignant neoplasm of rectum: Secondary | ICD-10-CM

## 2019-09-19 DIAGNOSIS — E538 Deficiency of other specified B group vitamins: Secondary | ICD-10-CM

## 2019-09-19 NOTE — Telephone Encounter (Signed)
Will pt need to RTC for labs 1-2 days prior?

## 2019-09-19 NOTE — Telephone Encounter (Signed)
-----   Message from [name], MD sent at 09/17/2019  9:57 AM EDT ----- He was seen by me. Rectal cancer, Please arrange him to follow up with me lab md cbc hold tube, b12, retic panel, +/- blood transfusion +/- B12 injection, this week. See palliative care too.  Also arrange him to see Dr.Chrystal. rectal cancer.   He has cognitive impairment. Please contact his nephew Christopher Delacruz for appt. Thanks. Pt was discharged to a facility

## 2019-09-19 NOTE — Telephone Encounter (Signed)
Pt is hospitial follow up, please schedule port lab/MD/ poss blood or venofer *new*/ poss b12 inj... one day this week.  Schedule follow up with JOSH and Dr. Baruch Gouty as well.   Please notify Nephew MARK for appts details. Thanks

## 2019-09-19 NOTE — Telephone Encounter (Signed)
Can you schedule pt to see palliative as well please.

## 2019-09-19 NOTE — Telephone Encounter (Signed)
Done Appts has been scheduled as requested and pts nephew [name] was made aware.

## 2019-09-22 ENCOUNTER — Other Ambulatory Visit: Payer: Self-pay

## 2019-09-22 ENCOUNTER — Encounter: Payer: Self-pay | Admitting: Oncology

## 2019-09-22 ENCOUNTER — Inpatient Hospital Stay: Payer: Medicare Other

## 2019-09-22 ENCOUNTER — Inpatient Hospital Stay: Payer: Medicare Other | Attending: Oncology

## 2019-09-22 ENCOUNTER — Inpatient Hospital Stay (HOSPITAL_BASED_OUTPATIENT_CLINIC_OR_DEPARTMENT_OTHER): Payer: Medicare Other | Admitting: Oncology

## 2019-09-22 VITALS — BP 131/82 | HR 70 | Temp 95.5°F | Resp 18 | Ht 73.62 in | Wt 216.0 lb

## 2019-09-22 VITALS — BP 166/72 | HR 53 | Resp 18

## 2019-09-22 DIAGNOSIS — E538 Deficiency of other specified B group vitamins: Secondary | ICD-10-CM | POA: Diagnosis not present

## 2019-09-22 DIAGNOSIS — K2970 Gastritis, unspecified, without bleeding: Secondary | ICD-10-CM

## 2019-09-22 DIAGNOSIS — Z5111 Encounter for antineoplastic chemotherapy: Secondary | ICD-10-CM | POA: Insufficient documentation

## 2019-09-22 DIAGNOSIS — B9681 Helicobacter pylori [H. pylori] as the cause of diseases classified elsewhere: Secondary | ICD-10-CM

## 2019-09-22 DIAGNOSIS — C20 Malignant neoplasm of rectum: Secondary | ICD-10-CM | POA: Diagnosis present

## 2019-09-22 DIAGNOSIS — Z7189 Other specified counseling: Secondary | ICD-10-CM

## 2019-09-22 DIAGNOSIS — D509 Iron deficiency anemia, unspecified: Secondary | ICD-10-CM | POA: Insufficient documentation

## 2019-09-22 DIAGNOSIS — I2510 Atherosclerotic heart disease of native coronary artery without angina pectoris: Secondary | ICD-10-CM | POA: Insufficient documentation

## 2019-09-22 DIAGNOSIS — Z823 Family history of stroke: Secondary | ICD-10-CM | POA: Diagnosis not present

## 2019-09-22 DIAGNOSIS — Z8249 Family history of ischemic heart disease and other diseases of the circulatory system: Secondary | ICD-10-CM | POA: Insufficient documentation

## 2019-09-22 DIAGNOSIS — M7989 Other specified soft tissue disorders: Secondary | ICD-10-CM | POA: Insufficient documentation

## 2019-09-22 DIAGNOSIS — I700 Atherosclerosis of aorta: Secondary | ICD-10-CM | POA: Insufficient documentation

## 2019-09-22 DIAGNOSIS — Z806 Family history of leukemia: Secondary | ICD-10-CM | POA: Diagnosis not present

## 2019-09-22 DIAGNOSIS — R4189 Other symptoms and signs involving cognitive functions and awareness: Secondary | ICD-10-CM | POA: Diagnosis not present

## 2019-09-22 DIAGNOSIS — Z79899 Other long term (current) drug therapy: Secondary | ICD-10-CM | POA: Diagnosis not present

## 2019-09-22 LAB — CBC WITH DIFFERENTIAL/PLATELET
Abs Immature Granulocytes: 0.1 10*3/uL — ABNORMAL HIGH (ref 0.00–0.07)
Basophils Absolute: 0.1 10*3/uL (ref 0.0–0.1)
Basophils Relative: 1 %
Eosinophils Absolute: 0.3 10*3/uL (ref 0.0–0.5)
Eosinophils Relative: 3 %
HCT: 29.8 % — ABNORMAL LOW (ref 39.0–52.0)
Hemoglobin: 8.9 g/dL — ABNORMAL LOW (ref 13.0–17.0)
Immature Granulocytes: 1 %
Lymphocytes Relative: 20 %
Lymphs Abs: 1.8 10*3/uL (ref 0.7–4.0)
MCH: 23.3 pg — ABNORMAL LOW (ref 26.0–34.0)
MCHC: 29.9 g/dL — ABNORMAL LOW (ref 30.0–36.0)
MCV: 78 fL — ABNORMAL LOW (ref 80.0–100.0)
Monocytes Absolute: 0.7 10*3/uL (ref 0.1–1.0)
Monocytes Relative: 7 %
Neutro Abs: 6.3 10*3/uL (ref 1.7–7.7)
Neutrophils Relative %: 68 %
Platelets: 315 10*3/uL (ref 150–400)
RBC: 3.82 MIL/uL — ABNORMAL LOW (ref 4.22–5.81)
RDW: 23.6 % — ABNORMAL HIGH (ref 11.5–15.5)
WBC: 9.2 10*3/uL (ref 4.0–10.5)
nRBC: 0 % (ref 0.0–0.2)

## 2019-09-22 LAB — RETIC PANEL
Immature Retic Fract: 14.6 % (ref 2.3–15.9)
RBC.: 3.74 MIL/uL — ABNORMAL LOW (ref 4.22–5.81)
Retic Count, Absolute: 49 10*3/uL (ref 19.0–186.0)
Retic Ct Pct: 1.3 % (ref 0.4–3.1)
Reticulocyte Hemoglobin: 27.3 pg — ABNORMAL LOW (ref >27.9)

## 2019-09-22 LAB — SAMPLE TO BLOOD BANK

## 2019-09-22 LAB — VITAMIN B12: Vitamin B-12: 468 pg/mL (ref 180–914)

## 2019-09-22 MED ORDER — SODIUM CHLORIDE 0.9 % IV SOLN: 200.0000 mg | Freq: Once | INTRAVENOUS | Status: DC

## 2019-09-22 MED ORDER — SODIUM CHLORIDE 0.9 % IV SOLN
Freq: Once | INTRAVENOUS | Status: AC
  Filled 2019-09-22: qty 250

## 2019-09-22 MED ORDER — ONDANSETRON HCL 8 MG PO TABS: 8.0000 mg | ORAL_TABLET | Freq: Two times a day (BID) | ORAL | 1 refills | Status: DC | PRN

## 2019-09-22 MED ORDER — CYANOCOBALAMIN 1000 MCG/ML IJ SOLN
1000.0000 ug | Freq: Once | INTRAMUSCULAR | Status: AC
  Administered 2019-09-22: 1000 ug via INTRAMUSCULAR
  Filled 2019-09-22: qty 1

## 2019-09-22 MED ORDER — PROCHLORPERAZINE MALEATE 10 MG PO TABS: 10.0000 mg | ORAL_TABLET | Freq: Four times a day (QID) | ORAL | 1 refills | Status: DC | PRN

## 2019-09-22 MED ORDER — LIDOCAINE-PRILOCAINE 2.5-2.5 % EX CREA: TOPICAL_CREAM | CUTANEOUS | 3 refills | Status: DC

## 2019-09-22 MED ORDER — HEPARIN SOD (PORK) LOCK FLUSH 100 UNIT/ML IV SOLN
INTRAVENOUS | Status: AC
  Filled 2019-09-22: qty 5

## 2019-09-22 MED ORDER — SODIUM CHLORIDE 0.9% FLUSH
10.0000 mL | Freq: Once | INTRAVENOUS | Status: AC
  Administered 2019-09-22: 10 mL via INTRAVENOUS
  Filled 2019-09-22: qty 10

## 2019-09-22 MED ORDER — HEPARIN SOD (PORK) LOCK FLUSH 100 UNIT/ML IV SOLN
500.0000 [IU] | Freq: Once | INTRAVENOUS | Status: AC | PRN
  Administered 2019-09-22: 500 [IU]
  Filled 2019-09-22: qty 5

## 2019-09-22 MED ORDER — IRON SUCROSE 20 MG/ML IV SOLN
200.0000 mg | Freq: Once | INTRAVENOUS | Status: AC
  Administered 2019-09-22: 200 mg via INTRAVENOUS
  Filled 2019-09-22: qty 10

## 2019-09-22 NOTE — Progress Notes (Signed)
New patient evaluation.  He is accompanied by his nephew today and is a resident at [facility].

## 2019-09-22 NOTE — Progress Notes (Signed)
Hematology/Oncology Follow Up Note [facility]  [phone] [phone]  Patient Care Team: Donnie Coffin, MD as PCP - General (Family Medicine)   Name of the patient: Christopher Delacruz  [id number]  Sep 26, 1950   REASON FOR VISIT  follow-up for rectal cancer  INTERVAL HISTORY 69 y.o. patient presents for follow-up of rectal cancer.Marland Kitchen  He was accompanied by his nephew Christopher Delacruz.  Christopher Delacruz is also his power of attorney. Patient was evaluated by psychiatrist during his last hospitalization and was considered not competent to make decisions.  [facility]  Patient denies any additional bloody diarrhea. Denies any pain.  Patient is a poor historian. Christopher Delacruz reports that he noticed mild right upper extremity swelling.  Review of Systems  Unable to perform ROS: Other (Cognitive impairment)  Gastrointestinal: Negative for abdominal pain.      No Known Allergies   Past Medical History:  Diagnosis Date  . B12 deficiency 09/17/2019  . CVA (cerebral vascular accident) ([place])   . Diabetes mellitus ([place])   . Hyperlipidemia   . Hypertension      Past Surgical History:  Procedure Laterality Date  . COLONOSCOPY N/A 09/11/2019   Procedure: COLONOSCOPY;  Surgeon: [name], MD;  Location: [facility] ENDOSCOPY;  Service: Endoscopy;  Laterality: N/A;  . COLONOSCOPY WITH PROPOFOL N/A 09/12/2019   Procedure: COLONOSCOPY WITH PROPOFOL;  Surgeon: [name], MD;  Location: [facility] ENDOSCOPY;  Service: Gastroenterology;  Laterality: N/A;  . COLONOSCOPY WITH PROPOFOL N/A 09/13/2019   Procedure: COLONOSCOPY WITH PROPOFOL;  Surgeon: [name], MD;  Location: [facility] ENDOSCOPY;  Service: Gastroenterology;  Laterality: N/A;  . ESOPHAGOGASTRODUODENOSCOPY N/A 09/11/2019   Procedure: ESOPHAGOGASTRODUODENOSCOPY (EGD);  Surgeon: [name], MD;  Location: [facility] ENDOSCOPY;  Service: Endoscopy;  Laterality: N/A;  . EXPLORE EYE SOCKET    . PORTACATH PLACEMENT  N/A 09/15/2019   Procedure: INSERTION PORT-A-CATH;  Surgeon: Jules Husbands, MD;  Location: [facility] ORS;  Service: General;  Laterality: N/A;    Social History   Socioeconomic History  . Marital status: Divorced    Spouse name: Not on file  . Number of children: Not on file  . Years of education: Not on file  . Highest education level: Not on file  Occupational History  . Not on file  Tobacco Use  . Smoking status: Never Smoker  . Smokeless tobacco: Never Used  Vaping Use  . Vaping Use: Never used  Substance and Sexual Activity  . Alcohol use: Never    Alcohol/week: 0.0 standard drinks  . Drug use: Never  . Sexual activity: Not on file  Other Topics Concern  . Not on file  Social History Narrative  . Not on file   Social Determinants of Health   Financial Resource Strain:   . Difficulty of Paying Living Expenses: Not on file  Food Insecurity:   . Worried About Charity fundraiser in the Last Year: Not on file  . Ran Out of Food in the Last Year: Not on file  Transportation Needs:   . Lack of Transportation (Medical): Not on file  . Lack of Transportation (Non-Medical): Not on file  Physical Activity:   . Days of Exercise per Week: Not on file  . Minutes of Exercise per Session: Not on file  Stress:   . Feeling of Stress : Not on file  Social Connections:   . Frequency of Communication with Friends and Family: Not on file  . Frequency of Social  Gatherings with Friends and Family: Not on file  . Attends Religious Services: Not on file  . Active Member of Clubs or Organizations: Not on file  . Attends Archivist Meetings: Not on file  . Marital Status: Not on file  Intimate Partner Violence:   . Fear of Current or Ex-Partner: Not on file  . Emotionally Abused: Not on file  . Physically Abused: Not on file  . Sexually Abused: Not on file    Family History  Problem Relation Age of Onset  . Heart attack Sister   . Leukemia Brother   . Stroke Brother       Current Outpatient Medications:  .  amLODipine (NORVASC) 10 MG tablet, Take 1 tablet (10 mg total) by mouth daily., Disp: 30 tablet, Rfl: 1 .  amoxicillin (AMOXIL) 500 MG capsule, Take 2 capsules (1,000 mg total) by mouth every 12 (twelve) hours for 10 days., Disp: 40 capsule, Rfl: 0 .  aspirin EC 81 MG EC tablet, Take 1 tablet (81 mg total) by mouth daily. Swallow whole., Disp: 30 tablet, Rfl: 11 .  clarithromycin (BIAXIN) 500 MG tablet, Take 1 tablet (500 mg total) by mouth every 12 (twelve) hours for 10 days., Disp: 20 tablet, Rfl: 0 .  clopidogrel (PLAVIX) 75 MG tablet, Take 1 tablet (75 mg total) by mouth daily., Disp: 30 tablet, Rfl: 1 .  ferrous sulfate 325 (65 FE) MG tablet, Take 1 tablet (325 mg total) by mouth daily., Disp: 30 tablet, Rfl: 3 .  finasteride (PROSCAR) 5 MG tablet, Take 1 tablet (5 mg total) by mouth daily., Disp: 30 tablet, Rfl: 0 .  Multiple Vitamin (MULTIVITAMIN WITH MINERALS) TABS tablet, Take 1 tablet by mouth daily., Disp: 30 tablet, Rfl: 0 .  pantoprazole (PROTONIX) 40 MG tablet, Take 1 tablet (40 mg total) by mouth 2 (two) times daily before a meal for 10 days., Disp: 20 tablet, Rfl: 0 .  tamsulosin (FLOMAX) 0.4 MG CAPS capsule, Take 1 capsule (0.4 mg total) by mouth daily., Disp: 30 capsule, Rfl: 11 .  vitamin B-12 1000 MCG tablet, Take 1 tablet (1,000 mcg total) by mouth daily., Disp: 30 tablet, Rfl: 0  Physical exam: There were no vitals filed for this visit. Physical Exam Constitutional:      General: He is not in acute distress.    Comments: Patient sits in the wheelchair.  HENT:     Head: Normocephalic and atraumatic.  Eyes:     General: No scleral icterus. Cardiovascular:     Rate and Rhythm: Normal rate and regular rhythm.     Heart sounds: Normal heart sounds.  Pulmonary:     Effort: Pulmonary effort is normal. No respiratory distress.     Breath sounds: No wheezing.  Abdominal:     General: Bowel sounds are normal. There is no distension.      Palpations: Abdomen is soft.  Musculoskeletal:        General: No deformity. Normal range of motion.     Cervical back: Normal range of motion and neck supple.  Skin:    General: Skin is warm and dry.     Findings: No erythema or rash.  Neurological:     Mental Status: He is alert. Mental status is at baseline.     Cranial Nerves: No cranial nerve deficit.     Coordination: Coordination normal.     Comments: Orientated x2  Psychiatric:        Mood and Affect: Mood normal.  CMP Latest Ref Rng & Units 09/15/2019  Glucose 70 - 99 mg/dL 91  BUN 8 - 23 mg/dL 18  Creatinine 0.61 - 1.24 mg/dL 1.86(H)  Sodium 135 - 145 mmol/L 141  Potassium 3.5 - 5.1 mmol/L 3.9  Chloride 98 - 111 mmol/L 109  CO2 22 - 32 mmol/L 24  Calcium 8.9 - 10.3 mg/dL 8.5(L)  Total Protein 6.5 - 8.1 g/dL -  Total Bilirubin 0.3 - 1.2 mg/dL -  Alkaline Phos 38 - 126 U/L -  AST 15 - 41 U/L -  ALT 0 - 44 U/L -   CBC Latest Ref Rng & Units 09/16/2019  WBC 4.0 - 10.5 K/uL 9.8  Hemoglobin 13.0 - 17.0 g/dL 8.3(L)  Hematocrit 39 - 52 % 27.4(L)  Platelets 150 - 400 K/uL 244    RADIOGRAPHIC STUDIES: I have personally reviewed the radiological images as listed and agreed with the findings in the report. CT CHEST W CONTRAST  Result Date: 09/13/2019 CLINICAL DATA:  69 year old male with history of colorectal cancer. Staging examination. EXAM: CT CHEST, ABDOMEN, AND PELVIS WITH CONTRAST TECHNIQUE: Multidetector CT imaging of the chest, abdomen and pelvis was performed following the standard protocol during bolus administration of intravenous contrast. CONTRAST:  187mL OMNIPAQUE IOHEXOL 300 MG/ML  SOLN COMPARISON:  No priors. FINDINGS: CT CHEST FINDINGS Cardiovascular: Heart size is normal. There is no significant pericardial fluid, thickening or pericardial calcification. There is aortic atherosclerosis, as well as atherosclerosis of the great vessels of the mediastinum and the coronary arteries, including calcified  atherosclerotic plaque in the ramus intermedius coronary artery. Mediastinum/Nodes: No pathologically enlarged mediastinal or hilar lymph nodes. Esophagus is unremarkable in appearance. No axillary lymphadenopathy. Lungs/Pleura: Dependent areas of subsegmental atelectasis are noted in the lower lobes of the lungs bilaterally. No acute consolidative airspace disease. No pleural effusions. A few scattered tiny 2-3 mm pulmonary nodules are noted in the lungs bilaterally, nonspecific. No larger more suspicious appearing pulmonary nodules or masses are noted. Musculoskeletal: There are no aggressive appearing lytic or blastic lesions noted in the visualized portions of the skeleton. CT ABDOMEN PELVIS FINDINGS Hepatobiliary: No suspicious cystic or solid hepatic lesions. No intra or extrahepatic biliary ductal dilatation. Gallbladder is normal in appearance. Pancreas: No pancreatic mass. No pancreatic ductal dilatation. No pancreatic or peripancreatic fluid collections or inflammatory changes. Spleen: Unremarkable. Adrenals/Urinary Tract: Tiny subcentimeter low-attenuation lesions in the right kidney, too small to definitively characterize, but statistically likely to represent tiny cysts. Left kidney and bilateral adrenal glands are normal in appearance. No hydroureteronephrosis. Urinary bladder is normal in appearance. Stomach/Bowel: Normal appearance of the stomach. No pathologic dilatation of small bowel or colon. Masslike irregular mural thickening of the rectum which is difficult to discretely measure given its irregular appearance, but is best demonstrated on axial image 116 of series 504, presumably a primary rectal neoplasm. Notably, this obliterates the fat plane in the mesorectal fat between the lesion and the right seminal vesicle (best appreciated on axial image 116 of series 504). The appendix is not confidently identified and may be surgically absent. Regardless, there are no inflammatory changes noted  adjacent to the cecum to suggest the presence of an acute appendicitis at this time. Vascular/Lymphatic: Aortic atherosclerosis, without evidence of aneurysm or dissection in the abdominal or pelvic vasculature. Several prominent but nonenlarged mesorectal lymph nodes are noted measuring up to 7 mm in short axis. No other lymphadenopathy noted in the abdomen or pelvis. Reproductive: There is some obscuration of the fat plane between the  right seminal vesicle and the rectal mass (discussed above). Prostate gland and seminal vesicles are otherwise unremarkable in appearance. Other: No significant volume of ascites.  No pneumoperitoneum. Musculoskeletal: There are no aggressive appearing lytic or blastic lesions noted in the visualized portions of the skeleton. IMPRESSION: 1. Rectal mass with evidence of local invasion into the mesorectal fat and potential involvement of the right seminal vesicle. Multiple borderline enlarged mesorectal lymph nodes. No other definite evidence of distal metastatic disease elsewhere in the abdomen or pelvis. 2. Small pulmonary nodules measuring 2-3 mm in the lungs bilaterally, highly nonspecific and statistically likely benign. However, close attention on follow-up studies is recommended to ensure their stability as metastatic disease is not entirely excluded. 3. Aortic atherosclerosis, in addition to single-vessel coronary artery disease. Assessment for potential risk factor modification, dietary therapy or pharmacologic therapy may be warranted, if clinically indicated. 4. Additional incidental findings, as above. Electronically Signed   By: Vinnie Langton M.D.   On: 09/13/2019 19:56   MR BRAIN W WO CONTRAST  Result Date: 09/15/2019 CLINICAL DATA:  Provided history: Metastatic disease evaluation. Additional history provided: Recent diagnosis of rectal cancer, evaluate for metastatic disease. EXAM: MRI HEAD WITHOUT AND WITH CONTRAST TECHNIQUE: Multiplanar, multiecho pulse  sequences of the brain and surrounding structures were obtained without and with intravenous contrast. CONTRAST:  50mL GADAVIST GADOBUTROL 1 MMOL/ML IV SOLN COMPARISON:  Head CT 10/02/2018, MRI/MRA head 03/21/2018 and 03/22/2018 FINDINGS: Brain: Stable, moderate generalized parenchymal atrophy. There is a punctate focus of restricted diffusion within the left parietal white matter without appreciable corresponding enhancement (series 5, image 29) (series 7, image 9). Redemonstrated chronic hemorrhagic infarct within the left basal ganglia. Chronic lacunar infarcts within thalami have progressed as compared to the MRI of 03/21/2018. Redemonstrated chronic hemorrhagic infarct within the posterior left midbrain. Chronic infarcts within the pons have progressed. As before, there is background advanced chronic small vessel ischemic disease within the cerebral white matter. Wallerian degeneration of the left cerebral peduncle. No evidence of acute infarct elsewhere within the brain. There are few scattered supratentorial chronic microhemorrhages. No abnormal intracranial enhancement. No extra-axial fluid collection. No midline shift. Vascular: Expected proximal arterial flow voids. Skull and upper cervical spine: No focal suspicious marrow lesion. There is precontrast T1 hyperintensity within the ventral C3 vertebral body, likely benign. Sinuses/Orbits: Visualized orbits show no acute finding. Mild ethmoid sinus mucosal thickening. No significant mastoid effusion. IMPRESSION: There is a punctate focus of restricted diffusion within the left parietal white matter without appreciable corresponding enhancement. This most likely reflects an acute or early subacute small vessel infarct, although attention is recommended on follow-up. No evidence of intracranial metastatic disease elsewhere. Multiple chronic infarcts as described, some of which are new as compared to the prior MRI of 03/21/2018. Stable background moderate  generalized parenchymal atrophy and advanced chronic small vessel ischemic disease. Mild ethmoid sinus mucosal thickening. Electronically Signed   By: Kellie Simmering DO   On: 09/15/2019 15:07   MR PELVIS WO CONTRAST  Result Date: 09/14/2019 CLINICAL DATA:  Rectal mass with potential local invasion based on prior imaging EXAM: MRI PELVIS WITHOUT CONTRAST TECHNIQUE: Multiplanar multisequence MR imaging of the pelvis was performed. No intravenous contrast was administered. Small amount of [place] gel was administered per rectum to optimize tumor evaluation. COMPARISON:  09/13/2019 FINDINGS: TUMOR LOCATION Tumor distance from Anal Verge/Skin Surface:  13.3 cm Tumor distance to Internal Anal Sphincter: 5.9 cm TUMOR DESCRIPTION Circumferential Extent: Circumferential tumor in the upper margin of the tumor.  The tumor favors the RIGHT anterolateral rectum and is eccentric towards the inferior margin. Tumor Length: 8.2 cm T - CATEGORY Extension through Muscularis Propria: Yes, approximately 1.5-1.6 cm beyond the confines of the rectum in the axial plane. At least 6-7 mm beyond in the sagittal. Note that bulging of the anterior rectum with a maintained defined T2 hypointense rim is noted along the RIGHT anterolateral margin. Shortest Distance of any tumor/node from Mesorectal Fascia: 0 mm Extramural Vascular Invasion/Tumor Thrombus: Yes expanded low signal vein arising from an area of extra rectal extension along the LEFT lateral rectal margin (image 20, series 8) also on image 23 of series 9. Expanded vein with low signal also demonstrated on image 31 of series 8 Invasion of Anterior Peritoneal Reflection: Yes Involvement of Adjacent Organs or Pelvic Sidewall: Yes, invasion of the seminal vesicles best seen on image 21 of series 8, tumor passes through the anterior peritoneal reflection at this level extending to the seminal vesicles greatest on the RIGHT just to the RIGHT of midline. Levator Ani Involvement: No N - CATEGORY  Mesorectal Lymph Nodes >=34mm: N2 with multiple lymph nodes in the mesorectum extending high along the superior rectal vein and into the nodal chains along the inferior IMV as seen on the prior CT. Greater than 4 lymph nodes meet this criteria Extra-mesorectal Lymphadenopathy: Small lymph nodes along venous drainage pathways and lymphatic pathways above the mesorectum, no pelvic sidewall adenopathy. Other: Urinary Tract: Urinary bladder is grossly normal. No signs of distal ureteral dilation. Bowel: As above Vascular/Lymphatic: Lymphatic assessment as described. Reproductive: Seminal vesicle involvement as discussed. Other: None Musculoskeletal: Limited assessment of musculoskeletal structures is unremarkable. 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 Rectal adenocarcinoma N stage: N2 with superior rectal and nodes about the sacral promontory and just above, bordering on nodes outside of regional nodal stations. Distance from tumor to the internal anal sphincter is 13.3 cm. Electronically Signed   By: Zetta Bills M.D.   On: 09/14/2019 08:18   CT ABDOMEN PELVIS W CONTRAST  Result Date: 09/13/2019 CLINICAL DATA:  69 year old male with history of colorectal cancer. Staging examination. EXAM: CT CHEST, ABDOMEN, AND PELVIS WITH CONTRAST TECHNIQUE: Multidetector CT imaging of the chest, abdomen and pelvis was performed following the standard protocol during bolus administration of intravenous contrast. CONTRAST:  111mL OMNIPAQUE IOHEXOL 300 MG/ML  SOLN COMPARISON:  No priors. FINDINGS: CT CHEST FINDINGS Cardiovascular: Heart size is normal. There is no significant pericardial fluid, thickening or pericardial calcification. There is aortic atherosclerosis, as well as atherosclerosis of the great vessels of the mediastinum and the coronary arteries, including calcified atherosclerotic plaque in the ramus intermedius coronary artery. Mediastinum/Nodes: No pathologically enlarged  mediastinal or hilar lymph nodes. Esophagus is unremarkable in appearance. No axillary lymphadenopathy. Lungs/Pleura: Dependent areas of subsegmental atelectasis are noted in the lower lobes of the lungs bilaterally. No acute consolidative airspace disease. No pleural effusions. A few scattered tiny 2-3 mm pulmonary nodules are noted in the lungs bilaterally, nonspecific. No larger more suspicious appearing pulmonary nodules or masses are noted. Musculoskeletal: There are no aggressive appearing lytic or blastic lesions noted in the visualized portions of the skeleton. CT ABDOMEN PELVIS FINDINGS Hepatobiliary: No suspicious cystic or solid hepatic lesions. No intra or extrahepatic biliary ductal dilatation. Gallbladder is normal in appearance. Pancreas: No pancreatic mass. No pancreatic ductal dilatation. No pancreatic or peripancreatic fluid collections or inflammatory changes. Spleen: Unremarkable. Adrenals/Urinary Tract: Tiny subcentimeter low-attenuation lesions in the right kidney, too small  to definitively characterize, but statistically likely to represent tiny cysts. Left kidney and bilateral adrenal glands are normal in appearance. No hydroureteronephrosis. Urinary bladder is normal in appearance. Stomach/Bowel: Normal appearance of the stomach. No pathologic dilatation of small bowel or colon. Masslike irregular mural thickening of the rectum which is difficult to discretely measure given its irregular appearance, but is best demonstrated on axial image 116 of series 504, presumably a primary rectal neoplasm. Notably, this obliterates the fat plane in the mesorectal fat between the lesion and the right seminal vesicle (best appreciated on axial image 116 of series 504). The appendix is not confidently identified and may be surgically absent. Regardless, there are no inflammatory changes noted adjacent to the cecum to suggest the presence of an acute appendicitis at this time. Vascular/Lymphatic: Aortic  atherosclerosis, without evidence of aneurysm or dissection in the abdominal or pelvic vasculature. Several prominent but nonenlarged mesorectal lymph nodes are noted measuring up to 7 mm in short axis. No other lymphadenopathy noted in the abdomen or pelvis. Reproductive: There is some obscuration of the fat plane between the right seminal vesicle and the rectal mass (discussed above). Prostate gland and seminal vesicles are otherwise unremarkable in appearance. Other: No significant volume of ascites.  No pneumoperitoneum. Musculoskeletal: There are no aggressive appearing lytic or blastic lesions noted in the visualized portions of the skeleton. IMPRESSION: 1. Rectal mass with evidence of local invasion into the mesorectal fat and potential involvement of the right seminal vesicle. Multiple borderline enlarged mesorectal lymph nodes. No other definite evidence of distal metastatic disease elsewhere in the abdomen or pelvis. 2. Small pulmonary nodules measuring 2-3 mm in the lungs bilaterally, highly nonspecific and statistically likely benign. However, close attention on follow-up studies is recommended to ensure their stability as metastatic disease is not entirely excluded. 3. Aortic atherosclerosis, in addition to single-vessel coronary artery disease. Assessment for potential risk factor modification, dietary therapy or pharmacologic therapy may be warranted, if clinically indicated. 4. Additional incidental findings, as above. Electronically Signed   By: Vinnie Langton M.D.   On: 09/13/2019 19:56   DG CHEST PORT 1 VIEW  Result Date: 09/15/2019 CLINICAL DATA:  69 year old male status post Port-A-Cath placement EXAM: PORTABLE CHEST 1 VIEW COMPARISON:  Chest radiograph dated 03/21/2018 and CT dated 09/13/2019 FINDINGS: Right-sided Port-A-Cath with tip at the cavoatrial junction. There is mild cardiomegaly with mild vascular congestion. No focal consolidation or pneumothorax. Trace right pleural effusion  may be present. Atherosclerotic calcification of the aorta. No acute osseous pathology. IMPRESSION: Port-A-Cath with tip at the cavoatrial junction. Electronically Signed   By: Anner Crete M.D.   On: 09/15/2019 17:40   DG C-Arm 1-60 Min-No Report  Result Date: 09/15/2019 Fluoroscopy was utilized by the requesting physician.  No radiographic interpretation.     Assessment and plan 1. Swelling of upper arm   2. Goals of care, counseling/discussion   3. Rectal cancer ([place])   4. B12 deficiency   5. Helicobacter pylori gastritis   Cancer Staging Rectal cancer [facility]) Staging form: Colon and Rectum, AJCC 8th Edition - Clinical stage from 09/22/2019: Stage IIIC (cT4b, cN2, cM0) - Signed by [name], MD on 09/22/2019  Stage IIIC rectal cancer cT4 N2 M0 rectal cancer CT images, MR images were independently reviewed by me and discussed with patient and nephew Pathology was discussed with patient and nephew. Goals of care were discussed. Patient's nephew wishes patient to receive necessary treatments to hopefully get cured, is realistic that given patient's other  medical commodities, poor insights of his disease, patient may not tolerate aggressive treatments.  Nephew Christopher Delacruz wants patient to try less aggressive treatment and see if patient is able to tolerate and potentially palliate his cancer related symptoms. Currently no reports of severe rectal bleeding. I discussed with nephew about the recommendation of neoadjuvant chemotherapy with dose reduced FOLFOX for 8 cycles followed by concurrent chemotherapy and radiation. He may or may not be surgical candidate.   His chemotherapy regimen will be dose reduced due to his performance status and chronic other medical problems.  If he starts to bleed heavily, we may move up his radiation treatments and sandwich the radiation with chemotherapy.  Chemotherapy education was provided.  We had discussed the composition of chemotherapy regimen, length of  chemo cycle, duration of treatment and the time to assess response to treatment.    I explained to the patient the risks and benefits of chemotherapy including all but not limited to hair loss, mouth sore, nausea, vomiting, diarrhea, low blood counts, bleeding, neuropathy and risk of life threatening infection and even death, secondary malignancy etc.  . Mark voices understanding and willing to proceed chemotherapy.  Patient has had Mediport placed by Dr. Perrin Maltese.  Set up chemotherapy class.  Antiemetics-Zofran and Compazine; EMLA cream sent to pharmacy  Hopefully to start chemotherapy next week.  #Iron deficiency anemia, patient will receive 1 dose of Venofer treatment today.  Continue monitor hemoglobin. #H. pylori gastritis, patient to finish course of antibiotics and PPI #Vitamin B12 deficiency, B12 level has improved.  Continue with B12 supplementation. #Right upper extremity swelling, obtain ultrasound to rule out DVT.  Supportive care measures are necessary for patient well-being and will be provided as necessary. We spent sufficient time to discuss many aspect of care, questions were answered to patient's satisfaction. Orders Placed This Encounter  Procedures  . US Venous Img Upper Uni Right    Standing Status:   Future    Standing Expiration Date:   09/21/2020    Order Specific Question:   Reason for Exam (SYMPTOM  OR DIAGNOSIS REQUIRED)    Answer:   right arm swelling    Order Specific Question:   Preferred imaging location?    Answer:   [facility]    We spent sufficient time to discuss many aspect of care, questions were answered to patient's satisfaction. A total of 60 minutes was spent on this visit.  With 15 minutes spent reviewing image findings, pathology reports, 30 minutes counseling the patient on the diagnosis, goal of care, chemotherapy treatments, side effects of the treatment, management of symptoms.  Additional 15 minutes was spent on answering patient's  questions.    [name], MD, PhD Hematology Oncology [facility] Pager- [phone] 09/22/2019

## 2019-09-22 NOTE — Progress Notes (Signed)
Patient monitored x 30 minutes post Venofer treatment. Patient and VSS. Patient discharged.

## 2019-09-22 NOTE — Progress Notes (Signed)
START ON PATHWAY REGIMEN - Colorectal     A cycle is every 14 days:     Oxaliplatin      Leucovorin      Fluorouracil      Fluorouracil   **Always confirm dose/schedule in your pharmacy ordering system**  Patient Characteristics: Preoperative or Nonsurgical Candidate (Clinical Staging), Rectal, cT3 - cT4, cN0 or Any cT, cN+ Tumor Location: Rectal Therapeutic Status: Preoperative or Nonsurgical Candidate (Clinical Staging) AJCC T Category: cT4b AJCC N Category: cN2 AJCC M Category: cM0 AJCC 8 Stage Grouping: IIIC Intent of Therapy: Curative Intent, Discussed with Patient

## 2019-09-23 NOTE — Patient Instructions (Signed)
Oxaliplatin Injection What is this medicine? OXALIPLATIN (ox AL i PLA tin) is a chemotherapy drug. It targets fast dividing cells, like cancer cells, and causes these cells to die. This medicine is used to treat cancers of the colon and rectum, and many other cancers. This medicine may be used for other purposes; ask your health care provider or pharmacist if you have questions. COMMON BRAND NAME(S): Eloxatin What should I tell my health care provider before I take this medicine? They need to know if you have any of these conditions:  heart disease  history of irregular heartbeat  liver disease  low blood counts, like white cells, platelets, or red blood cells  lung or breathing disease, like asthma  take medicines that treat or prevent blood clots  tingling of the fingers or toes, or other nerve disorder  an unusual or allergic reaction to oxaliplatin, other chemotherapy, other medicines, foods, dyes, or preservatives  pregnant or trying to get pregnant  breast-feeding How should I use this medicine? This drug is given as an infusion into a vein. It is administered in a hospital or clinic by a specially trained health care professional. Talk to your pediatrician regarding the use of this medicine in children. Special care may be needed. Overdosage: If you think you have taken too much of this medicine contact a poison control center or emergency room at once. NOTE: This medicine is only for you. Do not share this medicine with others. What if I miss a dose? It is important not to miss a dose. Call your doctor or health care professional if you are unable to keep an appointment. What may interact with this medicine? Do not take this medicine with any of the following medications:  cisapride  dronedarone  pimozide  thioridazine This medicine may also interact with the following medications:  aspirin and aspirin-like medicines  certain medicines that treat or prevent  blood clots like warfarin, apixaban, dabigatran, and rivaroxaban  cisplatin  cyclosporine  diuretics  medicines for infection like acyclovir, adefovir, amphotericin B, bacitracin, cidofovir, foscarnet, ganciclovir, gentamicin, pentamidine, vancomycin  NSAIDs, medicines for pain and inflammation, like ibuprofen or naproxen  other medicines that prolong the QT interval (an abnormal heart rhythm)  pamidronate  zoledronic acid This list may not describe all possible interactions. Give your health care provider a list of all the medicines, herbs, non-prescription drugs, or dietary supplements you use. Also tell them if you smoke, drink alcohol, or use illegal drugs. Some items may interact with your medicine. What should I watch for while using this medicine? Your condition will be monitored carefully while you are receiving this medicine. You may need blood work done while you are taking this medicine. This medicine may make you feel generally unwell. This is not uncommon as chemotherapy can affect healthy cells as well as cancer cells. Report any side effects. Continue your course of treatment even though you feel ill unless your healthcare professional tells you to stop. This medicine can make you more sensitive to cold. Do not drink cold drinks or use ice. Cover exposed skin before coming in contact with cold temperatures or cold objects. When out in cold weather wear warm clothing and cover your mouth and nose to warm the air that goes into your lungs. Tell your doctor if you get sensitive to the cold. Do not become pregnant while taking this medicine or for 9 months after stopping it. Women should inform their health care professional if they wish to become   pregnant or think they might be pregnant. Men should not father a child while taking this medicine and for 6 months after stopping it. There is potential for serious side effects to an unborn child. Talk to your health care professional  for more information. Do not breast-feed a child while taking this medicine or for 3 months after stopping it. This medicine has caused ovarian failure in some women. This medicine may make it more difficult to get pregnant. Talk to your health care professional if you are concerned about your fertility. This medicine has caused decreased sperm counts in some men. This may make it more difficult to father a child. Talk to your health care professional if you are concerned about your fertility. This medicine may increase your risk of getting an infection. Call your health care professional for advice if you get a fever, chills, or sore throat, or other symptoms of a cold or flu. Do not treat yourself. Try to avoid being around people who are sick. Avoid taking medicines that contain aspirin, acetaminophen, ibuprofen, naproxen, or ketoprofen unless instructed by your health care professional. These medicines may hide a fever. Be careful brushing or flossing your teeth or using a toothpick because you may get an infection or bleed more easily. If you have any dental work done, tell your dentist you are receiving this medicine. What side effects may I notice from receiving this medicine? Side effects that you should report to your doctor or health care professional as soon as possible:  allergic reactions like skin rash, itching or hives, swelling of the face, lips, or tongue  breathing problems  cough  low blood counts - this medicine may decrease the number of white blood cells, red blood cells, and platelets. You may be at increased risk for infections and bleeding  nausea, vomiting  pain, redness, or irritation at site where injected  pain, tingling, numbness in the hands or feet  signs and symptoms of bleeding such as bloody or black, tarry stools; red or dark brown urine; spitting up blood or brown material that looks like coffee grounds; red spots on the skin; unusual bruising or bleeding  from the eyes, gums, or nose  signs and symptoms of a dangerous change in heartbeat or heart rhythm like chest pain; dizziness; fast, irregular heartbeat; palpitations; feeling faint or lightheaded; falls  signs and symptoms of infection like fever; chills; cough; sore throat; pain or trouble passing urine  signs and symptoms of liver injury like dark yellow or brown urine; general ill feeling or flu-like symptoms; light-colored stools; loss of appetite; nausea; right upper belly pain; unusually weak or tired; yellowing of the eyes or skin  signs and symptoms of low red blood cells or anemia such as unusually weak or tired; feeling faint or lightheaded; falls  signs and symptoms of muscle injury like dark urine; trouble passing urine or change in the amount of urine; unusually weak or tired; muscle pain; back pain Side effects that usually do not require medical attention (report to your doctor or health care professional if they continue or are bothersome):  changes in taste  diarrhea  gas  hair loss  loss of appetite  mouth sores This list may not describe all possible side effects. Call your doctor for medical advice about side effects. You may report side effects to FDA at 1-800-FDA-1088. Where should I keep my medicine? This drug is given in a hospital or clinic and will not be stored at home. NOTE:   This sheet is a summary. It may not cover all possible information. If you have questions about this medicine, talk to your doctor, pharmacist, or health care provider.  2020 Elsevier/Gold Standard (2018-06-02 12:20:35) Fluorouracil, 5-FU injection What is this medicine? FLUOROURACIL, 5-FU (flure oh YOOR a sil) is a chemotherapy drug. It slows the growth of cancer cells. This medicine is used to treat many types of cancer like breast cancer, colon or rectal cancer, pancreatic cancer, and stomach cancer. This medicine may be used for other purposes; ask your health care provider or  pharmacist if you have questions. COMMON BRAND NAME(S): Adrucil What should I tell my health care provider before I take this medicine? They need to know if you have any of these conditions:  blood disorders  dihydropyrimidine dehydrogenase (DPD) deficiency  infection (especially a virus infection such as chickenpox, cold sores, or herpes)  kidney disease  liver disease  malnourished, poor nutrition  recent or ongoing radiation therapy  an unusual or allergic reaction to fluorouracil, other chemotherapy, other medicines, foods, dyes, or preservatives  pregnant or trying to get pregnant  breast-feeding How should I use this medicine? This drug is given as an infusion or injection into a vein. It is administered in a hospital or clinic by a specially trained health care professional. Talk to your pediatrician regarding the use of this medicine in children. Special care may be needed. Overdosage: If you think you have taken too much of this medicine contact a poison control center or emergency room at once. NOTE: This medicine is only for you. Do not share this medicine with others. What if I miss a dose? It is important not to miss your dose. Call your doctor or health care professional if you are unable to keep an appointment. What may interact with this medicine?  allopurinol  cimetidine  dapsone  digoxin  hydroxyurea  leucovorin  levamisole  medicines for seizures like ethotoin, fosphenytoin, phenytoin  medicines to increase blood counts like filgrastim, pegfilgrastim, sargramostim  medicines that treat or prevent blood clots like warfarin, enoxaparin, and dalteparin  methotrexate  metronidazole  pyrimethamine  some other chemotherapy drugs like busulfan, cisplatin, estramustine, vinblastine  trimethoprim  trimetrexate  vaccines Talk to your doctor or health care professional before taking any of these  medicines:  acetaminophen  aspirin  ibuprofen  ketoprofen  naproxen This list may not describe all possible interactions. Give your health care provider a list of all the medicines, herbs, non-prescription drugs, or dietary supplements you use. Also tell them if you smoke, drink alcohol, or use illegal drugs. Some items may interact with your medicine. What should I watch for while using this medicine? Visit your doctor for checks on your progress. This drug may make you feel generally unwell. This is not uncommon, as chemotherapy can affect healthy cells as well as cancer cells. Report any side effects. Continue your course of treatment even though you feel ill unless your doctor tells you to stop. In some cases, you may be given additional medicines to help with side effects. Follow all directions for their use. Call your doctor or health care professional for advice if you get a fever, chills or sore throat, or other symptoms of a cold or flu. Do not treat yourself. This drug decreases your body's ability to fight infections. Try to avoid being around people who are sick. This medicine may increase your risk to bruise or bleed. Call your doctor or health care professional if you notice any   unusual bleeding. Be careful brushing and flossing your teeth or using a toothpick because you may get an infection or bleed more easily. If you have any dental work done, tell your dentist you are receiving this medicine. Avoid taking products that contain aspirin, acetaminophen, ibuprofen, naproxen, or ketoprofen unless instructed by your doctor. These medicines may hide a fever. Do not become pregnant while taking this medicine. Women should inform their doctor if they wish to become pregnant or think they might be pregnant. There is a potential for serious side effects to an unborn child. Talk to your health care professional or pharmacist for more information. Do not breast-feed an infant while taking  this medicine. Men should inform their doctor if they wish to father a child. This medicine may lower sperm counts. Do not treat diarrhea with over the counter products. Contact your doctor if you have diarrhea that lasts more than 2 days or if it is severe and watery. This medicine can make you more sensitive to the sun. Keep out of the sun. If you cannot avoid being in the sun, wear protective clothing and use sunscreen. Do not use sun lamps or tanning beds/booths. What side effects may I notice from receiving this medicine? Side effects that you should report to your doctor or health care professional as soon as possible:  allergic reactions like skin rash, itching or hives, swelling of the face, lips, or tongue  low blood counts - this medicine may decrease the number of white blood cells, red blood cells and platelets. You may be at increased risk for infections and bleeding.  signs of infection - fever or chills, cough, sore throat, pain or difficulty passing urine  signs of decreased platelets or bleeding - bruising, pinpoint red spots on the skin, black, tarry stools, blood in the urine  signs of decreased red blood cells - unusually weak or tired, fainting spells, lightheadedness  breathing problems  changes in vision  chest pain  mouth sores  nausea and vomiting  pain, swelling, redness at site where injected  pain, tingling, numbness in the hands or feet  redness, swelling, or sores on hands or feet  stomach pain  unusual bleeding Side effects that usually do not require medical attention (report to your doctor or health care professional if they continue or are bothersome):  changes in finger or toe nails  diarrhea  dry or itchy skin  hair loss  headache  loss of appetite  sensitivity of eyes to the light  stomach upset  unusually teary eyes This list may not describe all possible side effects. Call your doctor for medical advice about side effects.  You may report side effects to FDA at 1-800-FDA-1088. Where should I keep my medicine? This drug is given in a hospital or clinic and will not be stored at home. NOTE: This sheet is a summary. It may not cover all possible information. If you have questions about this medicine, talk to your doctor, pharmacist, or health care provider.  2020 Elsevier/Gold Standard (2007-05-19 13:53:16) Leucovorin injection What is this medicine? LEUCOVORIN (loo koe VOR in) is used to prevent or treat the harmful effects of some medicines. This medicine is used to treat anemia caused by a low amount of folic acid in the body. It is also used with 5-fluorouracil (5-FU) to treat colon cancer. This medicine may be used for other purposes; ask your health care provider or pharmacist if you have questions. What should I tell my health care   provider before I take this medicine? They need to know if you have any of these conditions:  anemia from low levels of vitamin B-12 in the blood  an unusual or allergic reaction to leucovorin, folic acid, other medicines, foods, dyes, or preservatives  pregnant or trying to get pregnant  breast-feeding How should I use this medicine? This medicine is for injection into a muscle or into a vein. It is given by a health care professional in a hospital or clinic setting. Talk to your pediatrician regarding the use of this medicine in children. Special care may be needed. Overdosage: If you think you have taken too much of this medicine contact a poison control center or emergency room at once. NOTE: This medicine is only for you. Do not share this medicine with others. What if I miss a dose? This does not apply. What may interact with this medicine?  capecitabine  fluorouracil  phenobarbital  phenytoin  primidone  trimethoprim-sulfamethoxazole This list may not describe all possible interactions. Give your health care provider a list of all the medicines, herbs,  non-prescription drugs, or dietary supplements you use. Also tell them if you smoke, drink alcohol, or use illegal drugs. Some items may interact with your medicine. What should I watch for while using this medicine? Your condition will be monitored carefully while you are receiving this medicine. This medicine may increase the side effects of 5-fluorouracil, 5-FU. Tell your doctor or health care professional if you have diarrhea or mouth sores that do not get better or that get worse. What side effects may I notice from receiving this medicine? Side effects that you should report to your doctor or health care professional as soon as possible:  allergic reactions like skin rash, itching or hives, swelling of the face, lips, or tongue  breathing problems  fever, infection  mouth sores  unusual bleeding or bruising  unusually weak or tired Side effects that usually do not require medical attention (report to your doctor or health care professional if they continue or are bothersome):  constipation or diarrhea  loss of appetite  nausea, vomiting This list may not describe all possible side effects. Call your doctor for medical advice about side effects. You may report side effects to FDA at 1-800-FDA-1088. Where should I keep my medicine? This drug is given in a hospital or clinic and will not be stored at home. NOTE: This sheet is a summary. It may not cover all possible information. If you have questions about this medicine, talk to your doctor, pharmacist, or health care provider.  2020 Elsevier/Gold Standard (2007-07-20 16:50:29)  

## 2019-09-26 ENCOUNTER — Other Ambulatory Visit: Payer: Self-pay

## 2019-09-26 ENCOUNTER — Inpatient Hospital Stay: Payer: Medicare Other

## 2019-09-26 ENCOUNTER — Telehealth: Payer: Self-pay

## 2019-09-26 ENCOUNTER — Inpatient Hospital Stay: Payer: Medicare Other | Admitting: Oncology

## 2019-09-26 ENCOUNTER — Inpatient Hospital Stay: Payer: Medicare Other | Admitting: Hospice and Palliative Medicine

## 2019-09-26 ENCOUNTER — Telehealth: Payer: Self-pay | Admitting: *Deleted

## 2019-09-26 ENCOUNTER — Ambulatory Visit
Admission: RE | Admit: 2019-09-26 | Discharge: 2019-09-26 | Disposition: A | Discharge status: Home / Self Care | Payer: No Typology Code available for payment source | Source: Ambulatory Visit | Attending: Oncology | Admitting: Oncology

## 2019-09-26 DIAGNOSIS — M7989 Other specified soft tissue disorders: Secondary | ICD-10-CM | POA: Insufficient documentation

## 2019-09-26 DIAGNOSIS — I808 Phlebitis and thrombophlebitis of other sites: Secondary | ICD-10-CM | POA: Diagnosis not present

## 2019-09-26 NOTE — Telephone Encounter (Signed)
Called report  IMPRESSION: 1. No evidence deep vein thrombosis in the right upper extremity. 2. Positive for superficial thrombophlebitis of the cephalic vein from just above the wrist to just proximal to the antecubital fossa.   Electronically Signed   By: Aletta Edouard M.D.   On: 09/26/2019 16:00

## 2019-09-26 NOTE — Telephone Encounter (Signed)
Patient came to chemo class all alone.  He stays at [facility].  Patient did not seem to comprehend any of the teaching material I was reviewing with him.  When questioned about port placement, he responded, he had it for several years when it was actually placed on 09/15/2019.   I reviewed the New Patient Education binder with him but after class I called nephew, Christopher Delacruz and reviewed some of the information.   Christopher Delacruz says his wife is going to [employer] at lunch today and he will make sure staff from [facility] receives the binder and call me so I can teach the caregivers about his upcoming chemotherapy and review information in his binder.

## 2019-09-27 ENCOUNTER — Inpatient Hospital Stay: Payer: Medicare Other | Admitting: Hospice and Palliative Medicine

## 2019-09-27 ENCOUNTER — Inpatient Hospital Stay (HOSPITAL_BASED_OUTPATIENT_CLINIC_OR_DEPARTMENT_OTHER): Payer: Medicare Other | Admitting: Hospice and Palliative Medicine

## 2019-09-27 ENCOUNTER — Encounter: Payer: Self-pay | Admitting: Oncology

## 2019-09-27 ENCOUNTER — Inpatient Hospital Stay: Payer: Medicare Other

## 2019-09-27 ENCOUNTER — Inpatient Hospital Stay (HOSPITAL_BASED_OUTPATIENT_CLINIC_OR_DEPARTMENT_OTHER): Payer: Medicare Other | Admitting: Oncology

## 2019-09-27 ENCOUNTER — Telehealth: Payer: Self-pay | Admitting: *Deleted

## 2019-09-27 ENCOUNTER — Telehealth: Payer: Self-pay | Admitting: Oncology

## 2019-09-27 VITALS — BP 161/69 | HR 61 | Temp 97.5°F | Wt 220.1 lb

## 2019-09-27 DIAGNOSIS — C20 Malignant neoplasm of rectum: Secondary | ICD-10-CM

## 2019-09-27 DIAGNOSIS — Z5111 Encounter for antineoplastic chemotherapy: Secondary | ICD-10-CM | POA: Insufficient documentation

## 2019-09-27 DIAGNOSIS — D500 Iron deficiency anemia secondary to blood loss (chronic): Secondary | ICD-10-CM

## 2019-09-27 DIAGNOSIS — Z7189 Other specified counseling: Secondary | ICD-10-CM

## 2019-09-27 DIAGNOSIS — R4189 Other symptoms and signs involving cognitive functions and awareness: Secondary | ICD-10-CM | POA: Diagnosis not present

## 2019-09-27 DIAGNOSIS — E538 Deficiency of other specified B group vitamins: Secondary | ICD-10-CM

## 2019-09-27 DIAGNOSIS — Z515 Encounter for palliative care: Secondary | ICD-10-CM

## 2019-09-27 LAB — CBC WITH DIFFERENTIAL/PLATELET
Abs Immature Granulocytes: 0.02 10*3/uL (ref 0.00–0.07)
Basophils Absolute: 0.1 10*3/uL (ref 0.0–0.1)
Basophils Relative: 1 %
Eosinophils Absolute: 0.3 10*3/uL (ref 0.0–0.5)
Eosinophils Relative: 3 %
HCT: 30.1 % — ABNORMAL LOW (ref 39.0–52.0)
Hemoglobin: 9.3 g/dL — ABNORMAL LOW (ref 13.0–17.0)
Immature Granulocytes: 0 %
Lymphocytes Relative: 20 %
Lymphs Abs: 1.7 10*3/uL (ref 0.7–4.0)
MCH: 24.2 pg — ABNORMAL LOW (ref 26.0–34.0)
MCHC: 30.9 g/dL (ref 30.0–36.0)
MCV: 78.4 fL — ABNORMAL LOW (ref 80.0–100.0)
Monocytes Absolute: 0.5 10*3/uL (ref 0.1–1.0)
Monocytes Relative: 6 %
Neutro Abs: 6 10*3/uL (ref 1.7–7.7)
Neutrophils Relative %: 70 %
Platelets: 319 10*3/uL (ref 150–400)
RBC: 3.84 MIL/uL — ABNORMAL LOW (ref 4.22–5.81)
RDW: 23.8 % — ABNORMAL HIGH (ref 11.5–15.5)
WBC: 8.7 10*3/uL (ref 4.0–10.5)
nRBC: 0 % (ref 0.0–0.2)

## 2019-09-27 LAB — COMPREHENSIVE METABOLIC PANEL
ALT: 11 U/L (ref 0–44)
AST: 16 U/L (ref 15–41)
Albumin: 3.2 g/dL — ABNORMAL LOW (ref 3.5–5.0)
Alkaline Phosphatase: 69 U/L (ref 38–126)
Anion gap: 6 (ref 5–15)
BUN: 16 mg/dL (ref 8–23)
CO2: 27 mmol/L (ref 22–32)
Calcium: 8.5 mg/dL — ABNORMAL LOW (ref 8.9–10.3)
Chloride: 109 mmol/L (ref 98–111)
Creatinine, Ser: 1.36 mg/dL — ABNORMAL HIGH (ref 0.61–1.24)
GFR calc Af Amer: >60 mL/min (ref >60)
GFR calc non Af Amer: 52 mL/min — ABNORMAL LOW (ref >60)
Glucose, Bld: 94 mg/dL (ref 70–99)
Potassium: 4.1 mmol/L (ref 3.5–5.1)
Sodium: 142 mmol/L (ref 135–145)
Total Bilirubin: 0.4 mg/dL (ref 0.3–1.2)
Total Protein: 6.8 g/dL (ref 6.5–8.1)

## 2019-09-27 MED ORDER — PALONOSETRON HCL INJECTION 0.25 MG/5ML
0.2500 mg | Freq: Once | INTRAVENOUS | Status: AC
  Administered 2019-09-27: 0.25 mg via INTRAVENOUS
  Filled 2019-09-27: qty 5

## 2019-09-27 MED ORDER — SODIUM CHLORIDE 0.9 % IV SOLN
Freq: Once | INTRAVENOUS | Status: AC
  Filled 2019-09-27: qty 250

## 2019-09-27 MED ORDER — ONDANSETRON HCL 8 MG PO TABS: 8.0000 mg | ORAL_TABLET | Freq: Two times a day (BID) | ORAL | 1 refills | Status: DC | PRN

## 2019-09-27 MED ORDER — SODIUM CHLORIDE 0.9 % IV SOLN
2400.0000 mg/m2 | INTRAVENOUS | Status: DC
  Administered 2019-09-27: 5400 mg via INTRAVENOUS
  Filled 2019-09-27: qty 108

## 2019-09-27 MED ORDER — IRON SUCROSE 20 MG/ML IV SOLN
200.0000 mg | Freq: Once | INTRAVENOUS | Status: AC
  Administered 2019-09-27: 200 mg via INTRAVENOUS
  Filled 2019-09-27: qty 10

## 2019-09-27 MED ORDER — LEUCOVORIN CALCIUM INJECTION 350 MG
398.0000 mg/m2 | Freq: Once | INTRAVENOUS | Status: AC
  Administered 2019-09-27: 900 mg via INTRAVENOUS
  Filled 2019-09-27: qty 45

## 2019-09-27 MED ORDER — PROCHLORPERAZINE MALEATE 10 MG PO TABS: 10.0000 mg | ORAL_TABLET | Freq: Four times a day (QID) | ORAL | 1 refills | Status: DC | PRN

## 2019-09-27 MED ORDER — DEXTROSE 5 % IV SOLN
Freq: Once | INTRAVENOUS | Status: AC
  Filled 2019-09-27: qty 250

## 2019-09-27 MED ORDER — SODIUM CHLORIDE 0.9 % IV SOLN: 200.0000 mg | Freq: Once | INTRAVENOUS | Status: DC

## 2019-09-27 MED ORDER — SODIUM CHLORIDE 0.9 % IV SOLN
10.0000 mg | Freq: Once | INTRAVENOUS | Status: AC
  Administered 2019-09-27: 10 mg via INTRAVENOUS
  Filled 2019-09-27: qty 10

## 2019-09-27 MED ORDER — OXALIPLATIN CHEMO INJECTION 100 MG/20ML
70.0000 mg/m2 | Freq: Once | INTRAVENOUS | Status: AC
  Administered 2019-09-27: 160 mg via INTRAVENOUS
  Filled 2019-09-27: qty 32

## 2019-09-27 NOTE — Progress Notes (Signed)
[place]  [phone] [phone]   Name: Christopher Delacruz Date: 09/27/2019 MRN: [id number]  DOB: 01-03-1951  Patient Care Team: Donnie Coffin, MD as PCP - General (Family Medicine)    REASON FOR CONSULTATION: Christopher Delacruz is a 69 y.o. male with multiple medical problems including history of CVA, diabetes, hypertension, and B12 deficiency.  Patient was hospitalized 09/07/2019-09/16/2019 with a GI bleed.  Patient underwent EGD and colonoscopy was found to have a large colorectal mass.  Biopsy positive for adenocarcinoma.  Of note, patient was seen by psychiatry and found to lack capacity for medical decision-making.  Medical oncology met with patient's nephew, [name], who is the healthcare power of attorney.  Family opted to pursue treatment and patient was started on neoadjuvant chemotherapy.  With plan for future XRT.  Palliative care was consulted up address goals.  SOCIAL HISTORY:     reports that he has never smoked. He has never used smokeless tobacco. He reports that he does not drink alcohol and does not use drugs.   Patient is a resident at [facility].  His niece and nephew are involved in decision-making.  Patient has no children.  He was married but twice divorced.  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   ADVANCE DIRECTIVES:  On file  CODE STATUS: Full code  PAST MEDICAL HISTORY: Past Medical History:  Diagnosis Date  . B12 deficiency 09/17/2019  . CVA (cerebral vascular accident) ([place])   . Diabetes mellitus ([place])   . Hyperlipidemia   . Hypertension     PAST SURGICAL HISTORY:  Past Surgical History:  Procedure Laterality Date  . COLONOSCOPY N/A 09/11/2019   Procedure: COLONOSCOPY;  Surgeon: [name], MD;  Location: [facility] ENDOSCOPY;  Service: Endoscopy;  Laterality: N/A;  . COLONOSCOPY WITH PROPOFOL N/A 09/12/2019   Procedure:  COLONOSCOPY WITH PROPOFOL;  Surgeon: [name], MD;  Location: [facility] ENDOSCOPY;  Service: Gastroenterology;  Laterality: N/A;  . COLONOSCOPY WITH PROPOFOL N/A 09/13/2019   Procedure: COLONOSCOPY WITH PROPOFOL;  Surgeon: [name], MD;  Location: [facility] ENDOSCOPY;  Service: Gastroenterology;  Laterality: N/A;  . ESOPHAGOGASTRODUODENOSCOPY N/A 09/11/2019   Procedure: ESOPHAGOGASTRODUODENOSCOPY (EGD);  Surgeon: [name], MD;  Location: [facility] ENDOSCOPY;  Service: Endoscopy;  Laterality: N/A;  . EXPLORE EYE SOCKET    . PORTACATH PLACEMENT N/A 09/15/2019   Procedure: INSERTION PORT-A-CATH;  Surgeon: Jules Husbands, MD;  Location: [facility] ORS;  Service: General;  Laterality: N/A;    HEMATOLOGY/ONCOLOGY HISTORY:  Oncology History  Rectal cancer ([place])  09/14/2019 Initial Diagnosis   Rectal cancer ([place])   09/22/2019 Cancer Staging   Staging form: Colon and Rectum, AJCC 8th Edition - Clinical stage from 09/22/2019: Stage IIIC (cT4b, cN2, cM0) - Signed by [name], MD on 09/22/2019   09/26/2019 -  Chemotherapy   The patient had palonosetron (ALOXI) injection 0.25 mg, 0.25 mg, Intravenous,  Once, 0 of 8 cycles leucovorin 904 mg in dextrose 5 % 250 mL infusion, 400 mg/m2, Intravenous,  Once, 0 of 8 cycles oxaliplatin (ELOXATIN) 160 mg in dextrose 5 % 500 mL chemo infusion, 70 mg/m2 = 160 mg (original dose ), Intravenous,  Once, 0 of 8 cycles Dose modification: 70 mg/m2 (Cycle 1, Reason: Provider Judgment) fluorouracil (ADRUCIL) chemo injection 900 mg, 400 mg/m2, Intravenous,  Once, 0 of 8 cycles fluorouracil (ADRUCIL) 5,400 mg in sodium chloride 0.9 % 142 mL chemo infusion, 2,400 mg/m2, Intravenous, 1  Day/Dose, 0 of 8 cycles  for chemotherapy treatment.      ALLERGIES:  has No Known Allergies.  MEDICATIONS:  Current Outpatient Medications  Medication Sig Dispense Refill  . amLODipine (NORVASC) 10 MG tablet Take 1 tablet (10 mg total) by mouth daily. 30 tablet 1  . amoxicillin (AMOXIL) 500 MG tablet  Take 500 mg by mouth 2 (two) times daily.    Marland Kitchen aspirin EC 81 MG EC tablet Take 1 tablet (81 mg total) by mouth daily. Swallow whole. 30 tablet 11  . clarithromycin (BIAXIN) 500 MG tablet Take 500 mg by mouth 2 (two) times daily.    . clopidogrel (PLAVIX) 75 MG tablet Take 1 tablet (75 mg total) by mouth daily. 30 tablet 1  . ferrous sulfate 325 (65 FE) MG tablet Take 1 tablet (325 mg total) by mouth daily. 30 tablet 3  . finasteride (PROSCAR) 5 MG tablet Take 1 tablet (5 mg total) by mouth daily. 30 tablet 0  . lidocaine-prilocaine (EMLA) cream Apply to affected area once (Patient not taking: Reported on 09/27/2019) 30 g 3  . Multiple Vitamin (MULTIVITAMIN WITH MINERALS) TABS tablet Take 1 tablet by mouth daily. 30 tablet 0  . ondansetron (ZOFRAN) 8 MG tablet Take 1 tablet (8 mg total) by mouth 2 (two) times daily as needed for refractory nausea / vomiting. Start on day 3 after chemotherapy. (Patient not taking: Reported on 09/27/2019) 30 tablet 1  . pantoprazole (PROTONIX) 40 MG tablet Take 1 tablet (40 mg total) by mouth 2 (two) times daily before a meal for 10 days. 20 tablet 0  . prochlorperazine (COMPAZINE) 10 MG tablet Take 1 tablet (10 mg total) by mouth every 6 (six) hours as needed (Nausea or vomiting). (Patient not taking: Reported on 09/27/2019) 30 tablet 1  . tamsulosin (FLOMAX) 0.4 MG CAPS capsule Take 1 capsule (0.4 mg total) by mouth daily. 30 capsule 11  . vitamin B-12 1000 MCG tablet Take 1 tablet (1,000 mcg total) by mouth daily. 30 tablet 0   No current facility-administered medications for this visit.    VITAL SIGNS: There were no vitals taken for this visit. There were no vitals filed for this visit.  Estimated body mass index is 28.55 kg/m as calculated from the following:   Height as of 09/22/19: 6' 1.62" (1.87 m).   Weight as of an earlier encounter on 09/27/19: 220 lb 1.6 oz (99.8 kg).  LABS: CBC:    Component Value Date/Time   WBC 8.7 09/27/2019 0859   HGB 9.3 (L)  09/27/2019 0859   HCT 30.1 (L) 09/27/2019 0859   PLT 319 09/27/2019 0859   MCV 78.4 (L) 09/27/2019 0859   NEUTROABS 6.0 09/27/2019 0859   LYMPHSABS 1.7 09/27/2019 0859   MONOABS 0.5 09/27/2019 0859   EOSABS 0.3 09/27/2019 0859   BASOSABS 0.1 09/27/2019 0859   Comprehensive Metabolic Panel:    Component Value Date/Time   NA 142 09/27/2019 0859   K 4.1 09/27/2019 0859   CL 109 09/27/2019 0859   CO2 27 09/27/2019 0859   BUN 16 09/27/2019 0859   CREATININE 1.36 (H) 09/27/2019 0859   GLUCOSE 94 09/27/2019 0859   CALCIUM 8.5 (L) 09/27/2019 0859   AST 16 09/27/2019 0859   ALT 11 09/27/2019 0859   ALKPHOS 69 09/27/2019 0859   BILITOT 0.4 09/27/2019 0859   PROT 6.8 09/27/2019 0859   ALBUMIN 3.2 (L) 09/27/2019 0859    RADIOGRAPHIC STUDIES: CT CHEST W CONTRAST  Result Date: 09/13/2019 CLINICAL  DATA:  69 year old male with history of colorectal cancer. Staging examination. EXAM: CT CHEST, ABDOMEN, AND PELVIS WITH CONTRAST TECHNIQUE: Multidetector CT imaging of the chest, abdomen and pelvis was performed following the standard protocol during bolus administration of intravenous contrast. CONTRAST:  16m OMNIPAQUE IOHEXOL 300 MG/ML  SOLN COMPARISON:  No priors. FINDINGS: CT CHEST FINDINGS Cardiovascular: Heart size is normal. There is no significant pericardial fluid, thickening or pericardial calcification. There is aortic atherosclerosis, as well as atherosclerosis of the great vessels of the mediastinum and the coronary arteries, including calcified atherosclerotic plaque in the ramus intermedius coronary artery. Mediastinum/Nodes: No pathologically enlarged mediastinal or hilar lymph nodes. Esophagus is unremarkable in appearance. No axillary lymphadenopathy. Lungs/Pleura: Dependent areas of subsegmental atelectasis are noted in the lower lobes of the lungs bilaterally. No acute consolidative airspace disease. No pleural effusions. A few scattered tiny 2-3 mm pulmonary nodules are noted in the  lungs bilaterally, nonspecific. No larger more suspicious appearing pulmonary nodules or masses are noted. Musculoskeletal: There are no aggressive appearing lytic or blastic lesions noted in the visualized portions of the skeleton. CT ABDOMEN PELVIS FINDINGS Hepatobiliary: No suspicious cystic or solid hepatic lesions. No intra or extrahepatic biliary ductal dilatation. Gallbladder is normal in appearance. Pancreas: No pancreatic mass. No pancreatic ductal dilatation. No pancreatic or peripancreatic fluid collections or inflammatory changes. Spleen: Unremarkable. Adrenals/Urinary Tract: Tiny subcentimeter low-attenuation lesions in the right kidney, too small to definitively characterize, but statistically likely to represent tiny cysts. Left kidney and bilateral adrenal glands are normal in appearance. No hydroureteronephrosis. Urinary bladder is normal in appearance. Stomach/Bowel: Normal appearance of the stomach. No pathologic dilatation of small bowel or colon. Masslike irregular mural thickening of the rectum which is difficult to discretely measure given its irregular appearance, but is best demonstrated on axial image 116 of series 504, presumably a primary rectal neoplasm. Notably, this obliterates the fat plane in the mesorectal fat between the lesion and the right seminal vesicle (best appreciated on axial image 116 of series 504). The appendix is not confidently identified and may be surgically absent. Regardless, there are no inflammatory changes noted adjacent to the cecum to suggest the presence of an acute appendicitis at this time. Vascular/Lymphatic: Aortic atherosclerosis, without evidence of aneurysm or dissection in the abdominal or pelvic vasculature. Several prominent but nonenlarged mesorectal lymph nodes are noted measuring up to 7 mm in short axis. No other lymphadenopathy noted in the abdomen or pelvis. Reproductive: There is some obscuration of the fat plane between the right seminal  vesicle and the rectal mass (discussed above). Prostate gland and seminal vesicles are otherwise unremarkable in appearance. Other: No significant volume of ascites.  No pneumoperitoneum. Musculoskeletal: There are no aggressive appearing lytic or blastic lesions noted in the visualized portions of the skeleton. IMPRESSION: 1. Rectal mass with evidence of local invasion into the mesorectal fat and potential involvement of the right seminal vesicle. Multiple borderline enlarged mesorectal lymph nodes. No other definite evidence of distal metastatic disease elsewhere in the abdomen or pelvis. 2. Small pulmonary nodules measuring 2-3 mm in the lungs bilaterally, highly nonspecific and statistically likely benign. However, close attention on follow-up studies is recommended to ensure their stability as metastatic disease is not entirely excluded. 3. Aortic atherosclerosis, in addition to single-vessel coronary artery disease. Assessment for potential risk factor modification, dietary therapy or pharmacologic therapy may be warranted, if clinically indicated. 4. Additional incidental findings, as above. Electronically Signed   By: DVinnie LangtonM.D.   On: 09/13/2019  19:56   MR BRAIN W WO CONTRAST  Result Date: 09/15/2019 CLINICAL DATA:  Provided history: Metastatic disease evaluation. Additional history provided: Recent diagnosis of rectal cancer, evaluate for metastatic disease. EXAM: MRI HEAD WITHOUT AND WITH CONTRAST TECHNIQUE: Multiplanar, multiecho pulse sequences of the brain and surrounding structures were obtained without and with intravenous contrast. CONTRAST:  66m GADAVIST GADOBUTROL 1 MMOL/ML IV SOLN COMPARISON:  Head CT 10/02/2018, MRI/MRA head 03/21/2018 and 03/22/2018 FINDINGS: Brain: Stable, moderate generalized parenchymal atrophy. There is a punctate focus of restricted diffusion within the left parietal white matter without appreciable corresponding enhancement (series 5, image 29) (series 7,  image 9). Redemonstrated chronic hemorrhagic infarct within the left basal ganglia. Chronic lacunar infarcts within thalami have progressed as compared to the MRI of 03/21/2018. Redemonstrated chronic hemorrhagic infarct within the posterior left midbrain. Chronic infarcts within the pons have progressed. As before, there is background advanced chronic small vessel ischemic disease within the cerebral white matter. Wallerian degeneration of the left cerebral peduncle. No evidence of acute infarct elsewhere within the brain. There are few scattered supratentorial chronic microhemorrhages. No abnormal intracranial enhancement. No extra-axial fluid collection. No midline shift. Vascular: Expected proximal arterial flow voids. Skull and upper cervical spine: No focal suspicious marrow lesion. There is precontrast T1 hyperintensity within the ventral C3 vertebral body, likely benign. Sinuses/Orbits: Visualized orbits show no acute finding. Mild ethmoid sinus mucosal thickening. No significant mastoid effusion. IMPRESSION: There is a punctate focus of restricted diffusion within the left parietal white matter without appreciable corresponding enhancement. This most likely reflects an acute or early subacute small vessel infarct, although attention is recommended on follow-up. No evidence of intracranial metastatic disease elsewhere. Multiple chronic infarcts as described, some of which are new as compared to the prior MRI of 03/21/2018. Stable background moderate generalized parenchymal atrophy and advanced chronic small vessel ischemic disease. Mild ethmoid sinus mucosal thickening. Electronically Signed   By: KKellie SimmeringDO   On: 09/15/2019 15:07   MR PELVIS WO CONTRAST  Result Date: 09/14/2019 CLINICAL DATA:  Rectal mass with potential local invasion based on prior imaging EXAM: MRI PELVIS WITHOUT CONTRAST TECHNIQUE: Multiplanar multisequence MR imaging of the pelvis was performed. No intravenous contrast was  administered. Small amount of UKoreagel was administered per rectum to optimize tumor evaluation. COMPARISON:  09/13/2019 FINDINGS: TUMOR LOCATION Tumor distance from Anal Verge/Skin Surface:  13.3 cm Tumor distance to Internal Anal Sphincter: 5.9 cm TUMOR DESCRIPTION Circumferential Extent: Circumferential tumor in the upper margin of the tumor. The tumor favors the RIGHT anterolateral rectum and is eccentric towards the inferior margin. Tumor Length: 8.2 cm T - CATEGORY Extension through Muscularis Propria: Yes, approximately 1.5-1.6 cm beyond the confines of the rectum in the axial plane. At least 6-7 mm beyond in the sagittal. Note that bulging of the anterior rectum with a maintained defined T2 hypointense rim is noted along the RIGHT anterolateral margin. Shortest Distance of any tumor/node from Mesorectal Fascia: 0 mm Extramural Vascular Invasion/Tumor Thrombus: Yes expanded low signal vein arising from an area of extra rectal extension along the LEFT lateral rectal margin (image 20, series 8) also on image 23 of series 9. Expanded vein with low signal also demonstrated on image 31 of series 8 Invasion of Anterior Peritoneal Reflection: Yes Involvement of Adjacent Organs or Pelvic Sidewall: Yes, invasion of the seminal vesicles best seen on image 21 of series 8, tumor passes through the anterior peritoneal reflection at this level extending to the seminal vesicles greatest on  the RIGHT just to the RIGHT of midline. Levator Ani Involvement: No N - CATEGORY Mesorectal Lymph Nodes >=68m: N2 with multiple lymph nodes in the mesorectum extending high along the superior rectal vein and into the nodal chains along the inferior IMV as seen on the prior CT. Greater than 4 lymph nodes meet this criteria Extra-mesorectal Lymphadenopathy: Small lymph nodes along venous drainage pathways and lymphatic pathways above the mesorectum, no pelvic sidewall adenopathy. Other: Urinary Tract: Urinary bladder is grossly normal. No  signs of distal ureteral dilation. Bowel: As above Vascular/Lymphatic: Lymphatic assessment as described. Reproductive: Seminal vesicle involvement as discussed. Other: None Musculoskeletal: Limited assessment of musculoskeletal structures is unremarkable. 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 Rectal adenocarcinoma N stage: N2 with superior rectal and nodes about the sacral promontory and just above, bordering on nodes outside of regional nodal stations. Distance from tumor to the internal anal sphincter is 13.3 cm. Electronically Signed   By: GZetta BillsM.D.   On: 09/14/2019 08:18   CT ABDOMEN PELVIS W CONTRAST  Result Date: 09/13/2019 CLINICAL DATA:  69year old male with history of colorectal cancer. Staging examination. EXAM: CT CHEST, ABDOMEN, AND PELVIS WITH CONTRAST TECHNIQUE: Multidetector CT imaging of the chest, abdomen and pelvis was performed following the standard protocol during bolus administration of intravenous contrast. CONTRAST:  1073mOMNIPAQUE IOHEXOL 300 MG/ML  SOLN COMPARISON:  No priors. FINDINGS: CT CHEST FINDINGS Cardiovascular: Heart size is normal. There is no significant pericardial fluid, thickening or pericardial calcification. There is aortic atherosclerosis, as well as atherosclerosis of the great vessels of the mediastinum and the coronary arteries, including calcified atherosclerotic plaque in the ramus intermedius coronary artery. Mediastinum/Nodes: No pathologically enlarged mediastinal or hilar lymph nodes. Esophagus is unremarkable in appearance. No axillary lymphadenopathy. Lungs/Pleura: Dependent areas of subsegmental atelectasis are noted in the lower lobes of the lungs bilaterally. No acute consolidative airspace disease. No pleural effusions. A few scattered tiny 2-3 mm pulmonary nodules are noted in the lungs bilaterally, nonspecific. No larger more suspicious appearing pulmonary nodules or masses are noted.  Musculoskeletal: There are no aggressive appearing lytic or blastic lesions noted in the visualized portions of the skeleton. CT ABDOMEN PELVIS FINDINGS Hepatobiliary: No suspicious cystic or solid hepatic lesions. No intra or extrahepatic biliary ductal dilatation. Gallbladder is normal in appearance. Pancreas: No pancreatic mass. No pancreatic ductal dilatation. No pancreatic or peripancreatic fluid collections or inflammatory changes. Spleen: Unremarkable. Adrenals/Urinary Tract: Tiny subcentimeter low-attenuation lesions in the right kidney, too small to definitively characterize, but statistically likely to represent tiny cysts. Left kidney and bilateral adrenal glands are normal in appearance. No hydroureteronephrosis. Urinary bladder is normal in appearance. Stomach/Bowel: Normal appearance of the stomach. No pathologic dilatation of small bowel or colon. Masslike irregular mural thickening of the rectum which is difficult to discretely measure given its irregular appearance, but is best demonstrated on axial image 116 of series 504, presumably a primary rectal neoplasm. Notably, this obliterates the fat plane in the mesorectal fat between the lesion and the right seminal vesicle (best appreciated on axial image 116 of series 504). The appendix is not confidently identified and may be surgically absent. Regardless, there are no inflammatory changes noted adjacent to the cecum to suggest the presence of an acute appendicitis at this time. Vascular/Lymphatic: Aortic atherosclerosis, without evidence of aneurysm or dissection in the abdominal or pelvic vasculature. Several prominent but nonenlarged mesorectal lymph nodes are noted measuring up to 7 mm in short axis. No other  lymphadenopathy noted in the abdomen or pelvis. Reproductive: There is some obscuration of the fat plane between the right seminal vesicle and the rectal mass (discussed above). Prostate gland and seminal vesicles are otherwise unremarkable  in appearance. Other: No significant volume of ascites.  No pneumoperitoneum. Musculoskeletal: There are no aggressive appearing lytic or blastic lesions noted in the visualized portions of the skeleton. IMPRESSION: 1. Rectal mass with evidence of local invasion into the mesorectal fat and potential involvement of the right seminal vesicle. Multiple borderline enlarged mesorectal lymph nodes. No other definite evidence of distal metastatic disease elsewhere in the abdomen or pelvis. 2. Small pulmonary nodules measuring 2-3 mm in the lungs bilaterally, highly nonspecific and statistically likely benign. However, close attention on follow-up studies is recommended to ensure their stability as metastatic disease is not entirely excluded. 3. Aortic atherosclerosis, in addition to single-vessel coronary artery disease. Assessment for potential risk factor modification, dietary therapy or pharmacologic therapy may be warranted, if clinically indicated. 4. Additional incidental findings, as above. Electronically Signed   By: Vinnie Langton M.D.   On: 09/13/2019 19:56   US Venous Img Upper Uni Right  Result Date: 09/26/2019 CLINICAL DATA:  Right upper extremity edema. EXAM: RIGHT UPPER EXTREMITY VENOUS DOPPLER ULTRASOUND TECHNIQUE: Gray-scale sonography with graded compression, as well as color Doppler and duplex ultrasound were performed to evaluate the upper extremity deep venous system from the level of the subclavian vein and including the jugular, axillary, basilic, radial, ulnar and upper cephalic vein. Spectral Doppler was utilized to evaluate flow at rest and with distal augmentation maneuvers. COMPARISON:  None. FINDINGS: Contralateral Subclavian Vein: Respiratory phasicity is normal and symmetric with the symptomatic side. No evidence of thrombus. Normal compressibility. Internal Jugular Vein: No evidence of thrombus. Normal compressibility, respiratory phasicity and response to augmentation. Subclavian  Vein: No evidence of thrombus. Normal compressibility, respiratory phasicity and response to augmentation. Axillary Vein: No evidence of thrombus. Normal compressibility, respiratory phasicity and response to augmentation. Cephalic Vein: Thrombus is identified in the right cephalic vein beginning just above the wrist and continuing to just above the antecubital fossa. Basilic Vein: No evidence of thrombus. Normal compressibility, respiratory phasicity and response to augmentation. Brachial Veins: No evidence of thrombus. Normal compressibility, respiratory phasicity and response to augmentation. Radial Veins: No evidence of thrombus. Normal compressibility, respiratory phasicity and response to augmentation. Ulnar Veins: No evidence of thrombus. Normal compressibility, respiratory phasicity and response to augmentation. Venous Reflux:  None visualized. Other Findings:  No abnormal fluid collections identified. IMPRESSION: 1. No evidence deep vein thrombosis in the right upper extremity. 2. Positive for superficial thrombophlebitis of the cephalic vein from just above the wrist to just proximal to the antecubital fossa. Electronically Signed   By: Aletta Edouard M.D.   On: 09/26/2019 16:00   DG CHEST PORT 1 VIEW  Result Date: 09/15/2019 CLINICAL DATA:  69 year old male status post Port-A-Cath placement EXAM: PORTABLE CHEST 1 VIEW COMPARISON:  Chest radiograph dated 03/21/2018 and CT dated 09/13/2019 FINDINGS: Right-sided Port-A-Cath with tip at the cavoatrial junction. There is mild cardiomegaly with mild vascular congestion. No focal consolidation or pneumothorax. Trace right pleural effusion may be present. Atherosclerotic calcification of the aorta. No acute osseous pathology. IMPRESSION: Port-A-Cath with tip at the cavoatrial junction. Electronically Signed   By: Anner Crete M.D.   On: 09/15/2019 17:40   DG C-Arm 1-60 Min-No Report  Result Date: 09/15/2019 Fluoroscopy was utilized by the requesting  physician.  No radiographic interpretation.    PERFORMANCE  STATUS (ECOG) : 3 - Symptomatic, >50% confined to bed  Review of Systems Unless otherwise noted, a complete review of systems is negative.  Physical Exam General: NAD, in wheelchair Pulmonary: unlabored Extremities: no edema, no joint deformities Skin: no rashes Neurological: Weakness but otherwise nonfocal  IMPRESSION: I met with patient and his nephew today in clinic following their visit Dr. Tasia Catchings.  Patient is able to engage in conversation but lacks medical insight.  His family has decided to pursue treatment with systemic chemotherapy.  They're optimistic for a positive outcome.  At present, patient is at [facility] for rehab.  He is essentially wheelchair-bound but is able to assist with some of his own ADLs.  Patient no longer has his previous apartment.  His nephew has applied for Medicaid and it is possible that patient will require LTC.  In the event that patient function improves, family have talked about getting patient another apartment.  If that were to occur, it is highly likely that patient would need significant assistance in the home through [facility] or [facility].   Patient completed healthcare power of attorney documents when he was in the hospital previously.  Those are on file in [place].  I introduced the MOST form today, which nephew took home to discuss with his cousins.  At present, family would like for patient to remain a full code/full scope of treatment.  Symptomatically, patient seems to be doing well.  He denies any distressing symptoms today.  He reports his appetite is "too good."  PLAN: -Full code/full scope -Referral to palliative care at SNF -Follow-up in about 2 weeks  Case and plan discussed with Dr. Tasia Catchings  Patient expressed understanding and was in agreement with this plan. He also understands that He can call the clinic at any time with any questions, concerns, or complaints.     Time Total: 30  minutes  Visit consisted of counseling and education dealing with the complex and emotionally intense issues of symptom management and palliative care in the setting of serious and potentially life-threatening illness.Greater than 50%  of this time was spent counseling and coordinating care related to the above assessment and plan.  Signed by: Altha Harm, PhD, NP-C

## 2019-09-27 NOTE — Progress Notes (Signed)
Hematology/Oncology Follow Up Note [facility]  [phone] [phone]  Patient Care Team: Donnie Coffin, MD as PCP - General (Family Medicine)   Name of the patient: Christopher Delacruz  [id number]  July 11, 1950   REASON FOR VISIT  follow-up for rectal cancer  PERTINENT ONCOLOGY HISTORY Christopher Delacruz is a 69 y.o.[place] who has above oncology history reviewed by me today presented for follow up visit for management of locally advanced rectal cancer. -Initially presented to emergency room due to profound anemia. 09/11/2019 EGD showed duodenal erosions without bleeding 09/11/2019 colonoscopy preparation was poor.  Likely malignant partially obstructing tumor in the rectosigmoid colon area which was biopsied. 09/13/2019, repeat colonoscopy showed 12 mm polyps were found in the descending colon.  Polyp was sessile.  Polyp was resected and retrieved.  A polypoid ulcerated partially obstructing large mass was found from 10 to 20 cm proximal to the anus.  The mass was circumferential, measured 10 cm in length.  In addition the diameter measures 12 mm.  Oozing was present 09/11/2019, biopsy showed invasive colorectal adenocarcinoma.  Moderately differentiated. 09/13/2019, CT chest abdomen pelvis showed rectal wall mass with evidence of local invasion to the mesorectal fat and potential involvement of the right seminal vesicle.  Multiple enlarged lymph nodes.  Small lung nodules 2 to 3 mm in the lung bilaterally.  Highly nonspecific.  Cannot rule out metastasis.  09/14/2019 MRI pelvis showed T4b N2 rectal cancer. 09/15/2019 MRI brain with and without contrast showed punctate focus of restricted diffusion most likely reflects acute or early subacute small vessel infarct.  No evidence of intracranial metastasis.  Multiple chronic infarct.  #Iron deficiency anemia, patient received PRBC transfusion and IV Venofer treatments. #Vitamin B12 deficiency, status post parenteral  vitamin B12 injections during hospitalization.  Currently on vitamin B12 supplementation. #Patient was found to have very poor insight to his condition.  Was seen by psychiatrist and was deemed incompetent.  Patient's medical power of attorney is his nephew Christopher Delacruz.  #Mediport was placed by Dr. Linden Dolin L HISTORY 69 y.o. patient presents for follow-up of rectal cancer.Marland Kitchen  He was accompanied by his nephew Christopher Delacruz.  Christopher Delacruz is also his power of attorney.  Christopher Delacruz also called his wife who works in health care so that wife can participate in discussion. Patient and nephew have been to chemotherapy class.  Patient is here for evaluation prior to chemotherapy. Patient has received Venofer treatments.  Today he has no new complaints.  There is no report of significant rectal bleeding.  Denies any difficulty in passing bowel movements.   Patient reports that he has very small amount of blood occasionally.  No fever or chills.  He coughs occasionally Review of Systems  Unable to perform ROS: Other (Cognitive impairment)  Constitutional: Positive for fatigue.  Respiratory: Negative for shortness of breath.   Gastrointestinal: Positive for blood in stool. Negative for abdominal pain and constipation.      No Known Allergies   Past Medical History:  Diagnosis Date  . B12 deficiency 09/17/2019  . CVA (cerebral vascular accident) ([place])   . Diabetes mellitus ([place])   . Hyperlipidemia   . Hypertension      Past Surgical History:  Procedure Laterality Date  . COLONOSCOPY N/A 09/11/2019   Procedure: COLONOSCOPY;  Surgeon: [name], MD;  Location: [facility] ENDOSCOPY;  Service: Endoscopy;  Laterality: N/A;  . COLONOSCOPY WITH PROPOFOL N/A 09/12/2019   Procedure: COLONOSCOPY WITH PROPOFOL;  Surgeon: [name], MD;  Location:  [facility] ENDOSCOPY;  Service: Gastroenterology;  Laterality: N/A;  . COLONOSCOPY WITH PROPOFOL N/A 09/13/2019   Procedure: COLONOSCOPY WITH PROPOFOL;  Surgeon: [name], MD;  Location:  [facility] ENDOSCOPY;  Service: Gastroenterology;  Laterality: N/A;  . ESOPHAGOGASTRODUODENOSCOPY N/A 09/11/2019   Procedure: ESOPHAGOGASTRODUODENOSCOPY (EGD);  Surgeon: [name], MD;  Location: [facility] ENDOSCOPY;  Service: Endoscopy;  Laterality: N/A;  . EXPLORE EYE SOCKET    . PORTACATH PLACEMENT N/A 09/15/2019   Procedure: INSERTION PORT-A-CATH;  Surgeon: Jules Husbands, MD;  Location: [facility] ORS;  Service: General;  Laterality: N/A;    Social History   Socioeconomic History  . Marital status: Divorced    Spouse name: Not on file  . Number of children: Not on file  . Years of education: Not on file  . Highest education level: Not on file  Occupational History  . Not on file  Tobacco Use  . Smoking status: Never Smoker  . Smokeless tobacco: Never Used  Vaping Use  . Vaping Use: Never used  Substance and Sexual Activity  . Alcohol use: Never    Alcohol/week: 0.0 standard drinks  . Drug use: Never  . Sexual activity: Not on file  Other Topics Concern  . Not on file  Social History Narrative  . Not on file   Social Determinants of Health   Financial Resource Strain:   . Difficulty of Paying Living Expenses: Not on file  Food Insecurity:   . Worried About Charity fundraiser in the Last Year: Not on file  . Ran Out of Food in the Last Year: Not on file  Transportation Needs:   . Lack of Transportation (Medical): Not on file  . Lack of Transportation (Non-Medical): Not on file  Physical Activity:   . Days of Exercise per Week: Not on file  . Minutes of Exercise per Session: Not on file  Stress:   . Feeling of Stress : Not on file  Social Connections:   . Frequency of Communication with Friends and Family: Not on file  . Frequency of Social Gatherings with Friends and Family: Not on file  . Attends Religious Services: Not on file  . Active Member of Clubs or Organizations: Not on file  . Attends Archivist Meetings: Not on file  . Marital Status: Not on file    Intimate Partner Violence:   . Fear of Current or Ex-Partner: Not on file  . Emotionally Abused: Not on file  . Physically Abused: Not on file  . Sexually Abused: Not on file    Family History  Problem Relation Age of Onset  . Heart attack Sister   . Leukemia Brother   . Stroke Brother      Current Outpatient Medications:  .  amLODipine (NORVASC) 10 MG tablet, Take 1 tablet (10 mg total) by mouth daily., Disp: 30 tablet, Rfl: 1 .  amoxicillin (AMOXIL) 500 MG tablet, Take 500 mg by mouth 2 (two) times daily., Disp: , Rfl:  .  aspirin EC 81 MG EC tablet, Take 1 tablet (81 mg total) by mouth daily. Swallow whole., Disp: 30 tablet, Rfl: 11 .  clarithromycin (BIAXIN) 500 MG tablet, Take 500 mg by mouth 2 (two) times daily., Disp: , Rfl:  .  clopidogrel (PLAVIX) 75 MG tablet, Take 1 tablet (75 mg total) by mouth daily., Disp: 30 tablet, Rfl: 1 .  ferrous sulfate 325 (65 FE) MG tablet, Take 1 tablet (325 mg total) by mouth daily., Disp: 30 tablet, Rfl: 3 .  finasteride (PROSCAR) 5 MG tablet, Take 1 tablet (5 mg total) by mouth daily., Disp: 30 tablet, Rfl: 0 .  Multiple Vitamin (MULTIVITAMIN WITH MINERALS) TABS tablet, Take 1 tablet by mouth daily., Disp: 30 tablet, Rfl: 0 .  pantoprazole (PROTONIX) 40 MG tablet, Take 1 tablet (40 mg total) by mouth 2 (two) times daily before a meal for 10 days., Disp: 20 tablet, Rfl: 0 .  tamsulosin (FLOMAX) 0.4 MG CAPS capsule, Take 1 capsule (0.4 mg total) by mouth daily., Disp: 30 capsule, Rfl: 11 .  vitamin B-12 1000 MCG tablet, Take 1 tablet (1,000 mcg total) by mouth daily., Disp: 30 tablet, Rfl: 0 .  lidocaine-prilocaine (EMLA) cream, Apply to affected area once (Patient not taking: Reported on 09/27/2019), Disp: 30 g, Rfl: 3 .  ondansetron (ZOFRAN) 8 MG tablet, Take 1 tablet (8 mg total) by mouth 2 (two) times daily as needed for refractory nausea / vomiting. Start on day 3 after chemotherapy., Disp: 30 tablet, Rfl: 1 .  prochlorperazine (COMPAZINE)  10 MG tablet, Take 1 tablet (10 mg total) by mouth every 6 (six) hours as needed (Nausea or vomiting)., Disp: 30 tablet, Rfl: 1 No current facility-administered medications for this visit.  Facility-Administered Medications Ordered in Other Visits:  .  fluorouracil (ADRUCIL) 5,400 mg in sodium chloride 0.9 % 142 mL chemo infusion, 2,400 mg/m2 (Treatment Plan Recorded), Intravenous, 1 day or 1 dose, [name], MD .  leucovorin 900 mg in dextrose 5 % 250 mL infusion, 398 mg/m2 (Treatment Plan Recorded), Intravenous, Once, [name], MD, Last Rate: 148 mL/hr at 09/27/19 1155, 900 mg at 09/27/19 1155 .  oxaliplatin (ELOXATIN) 160 mg in dextrose 5 % 500 mL chemo infusion, 70 mg/m2 (Treatment Plan Recorded), Intravenous, Once, [name], MD, Last Rate: 266 mL/hr at 09/27/19 1156, 160 mg at 09/27/19 1156  Physical exam:  Vitals:   09/27/19 0913 09/27/19 0928  BP: (!) 170/61 (!) 161/69  Pulse: (!) 49 61  Temp: (!) 97.5 F (36.4 C)   TempSrc: Tympanic   SpO2: 98%   Weight: 220 lb 1.6 oz (99.8 kg)    Physical Exam Constitutional:      General: He is not in acute distress.    Comments: Patient sits in the wheelchair.  HENT:     Head: Normocephalic and atraumatic.  Eyes:     General: No scleral icterus. Cardiovascular:     Rate and Rhythm: Normal rate and regular rhythm.     Heart sounds: Normal heart sounds.  Pulmonary:     Effort: Pulmonary effort is normal. No respiratory distress.     Breath sounds: No wheezing.  Abdominal:     General: Bowel sounds are normal. There is no distension.     Palpations: Abdomen is soft.  Musculoskeletal:        General: No deformity. Normal range of motion.     Cervical back: Normal range of motion and neck supple.  Skin:    General: Skin is warm and dry.     Findings: No erythema or rash.  Neurological:     Mental Status: He is alert. Mental status is at baseline.     Cranial Nerves: No cranial nerve deficit.     Coordination: Coordination normal.      Comments: Orientated x2  Psychiatric:        Mood and Affect: Mood normal.     CMP Latest Ref Rng & Units 09/27/2019  Glucose 70 - 99 mg/dL 94  BUN 8 -  23 mg/dL 16  Creatinine 0.61 - 1.24 mg/dL 1.36(H)  Sodium 135 - 145 mmol/L 142  Potassium 3.5 - 5.1 mmol/L 4.1  Chloride 98 - 111 mmol/L 109  CO2 22 - 32 mmol/L 27  Calcium 8.9 - 10.3 mg/dL 8.5(L)  Total Protein 6.5 - 8.1 g/dL 6.8  Total Bilirubin 0.3 - 1.2 mg/dL 0.4  Alkaline Phos 38 - 126 U/L 69  AST 15 - 41 U/L 16  ALT 0 - 44 U/L 11   CBC Latest Ref Rng & Units 09/27/2019  WBC 4.0 - 10.5 K/uL 8.7  Hemoglobin 13.0 - 17.0 g/dL 9.3(L)  Hematocrit 39 - 52 % 30.1(L)  Platelets 150 - 400 K/uL 319    RADIOGRAPHIC STUDIES: I have personally reviewed the radiological images as listed and agreed with the findings in the report. CT CHEST W CONTRAST  Result Date: 09/13/2019 CLINICAL DATA:  69 year old male with history of colorectal cancer. Staging examination. EXAM: CT CHEST, ABDOMEN, AND PELVIS WITH CONTRAST TECHNIQUE: Multidetector CT imaging of the chest, abdomen and pelvis was performed following the standard protocol during bolus administration of intravenous contrast. CONTRAST:  17mL OMNIPAQUE IOHEXOL 300 MG/ML  SOLN COMPARISON:  No priors. FINDINGS: CT CHEST FINDINGS Cardiovascular: Heart size is normal. There is no significant pericardial fluid, thickening or pericardial calcification. There is aortic atherosclerosis, as well as atherosclerosis of the great vessels of the mediastinum and the coronary arteries, including calcified atherosclerotic plaque in the ramus intermedius coronary artery. Mediastinum/Nodes: No pathologically enlarged mediastinal or hilar lymph nodes. Esophagus is unremarkable in appearance. No axillary lymphadenopathy. Lungs/Pleura: Dependent areas of subsegmental atelectasis are noted in the lower lobes of the lungs bilaterally. No acute consolidative airspace disease. No pleural effusions. A few scattered tiny  2-3 mm pulmonary nodules are noted in the lungs bilaterally, nonspecific. No larger more suspicious appearing pulmonary nodules or masses are noted. Musculoskeletal: There are no aggressive appearing lytic or blastic lesions noted in the visualized portions of the skeleton. CT ABDOMEN PELVIS FINDINGS Hepatobiliary: No suspicious cystic or solid hepatic lesions. No intra or extrahepatic biliary ductal dilatation. Gallbladder is normal in appearance. Pancreas: No pancreatic mass. No pancreatic ductal dilatation. No pancreatic or peripancreatic fluid collections or inflammatory changes. Spleen: Unremarkable. Adrenals/Urinary Tract: Tiny subcentimeter low-attenuation lesions in the right kidney, too small to definitively characterize, but statistically likely to represent tiny cysts. Left kidney and bilateral adrenal glands are normal in appearance. No hydroureteronephrosis. Urinary bladder is normal in appearance. Stomach/Bowel: Normal appearance of the stomach. No pathologic dilatation of small bowel or colon. Masslike irregular mural thickening of the rectum which is difficult to discretely measure given its irregular appearance, but is best demonstrated on axial image 116 of series 504, presumably a primary rectal neoplasm. Notably, this obliterates the fat plane in the mesorectal fat between the lesion and the right seminal vesicle (best appreciated on axial image 116 of series 504). The appendix is not confidently identified and may be surgically absent. Regardless, there are no inflammatory changes noted adjacent to the cecum to suggest the presence of an acute appendicitis at this time. Vascular/Lymphatic: Aortic atherosclerosis, without evidence of aneurysm or dissection in the abdominal or pelvic vasculature. Several prominent but nonenlarged mesorectal lymph nodes are noted measuring up to 7 mm in short axis. No other lymphadenopathy noted in the abdomen or pelvis. Reproductive: There is some obscuration of  the fat plane between the right seminal vesicle and the rectal mass (discussed above). Prostate gland and seminal vesicles are otherwise unremarkable in  appearance. Other: No significant volume of ascites.  No pneumoperitoneum. Musculoskeletal: There are no aggressive appearing lytic or blastic lesions noted in the visualized portions of the skeleton. IMPRESSION: 1. Rectal mass with evidence of local invasion into the mesorectal fat and potential involvement of the right seminal vesicle. Multiple borderline enlarged mesorectal lymph nodes. No other definite evidence of distal metastatic disease elsewhere in the abdomen or pelvis. 2. Small pulmonary nodules measuring 2-3 mm in the lungs bilaterally, highly nonspecific and statistically likely benign. However, close attention on follow-up studies is recommended to ensure their stability as metastatic disease is not entirely excluded. 3. Aortic atherosclerosis, in addition to single-vessel coronary artery disease. Assessment for potential risk factor modification, dietary therapy or pharmacologic therapy may be warranted, if clinically indicated. 4. Additional incidental findings, as above. Electronically Signed   By: Vinnie Langton M.D.   On: 09/13/2019 19:56   MR BRAIN W WO CONTRAST  Result Date: 09/15/2019 CLINICAL DATA:  Provided history: Metastatic disease evaluation. Additional history provided: Recent diagnosis of rectal cancer, evaluate for metastatic disease. EXAM: MRI HEAD WITHOUT AND WITH CONTRAST TECHNIQUE: Multiplanar, multiecho pulse sequences of the brain and surrounding structures were obtained without and with intravenous contrast. CONTRAST:  88mL GADAVIST GADOBUTROL 1 MMOL/ML IV SOLN COMPARISON:  Head CT 10/02/2018, MRI/MRA head 03/21/2018 and 03/22/2018 FINDINGS: Brain: Stable, moderate generalized parenchymal atrophy. There is a punctate focus of restricted diffusion within the left parietal white matter without appreciable corresponding  enhancement (series 5, image 29) (series 7, image 9). Redemonstrated chronic hemorrhagic infarct within the left basal ganglia. Chronic lacunar infarcts within thalami have progressed as compared to the MRI of 03/21/2018. Redemonstrated chronic hemorrhagic infarct within the posterior left midbrain. Chronic infarcts within the pons have progressed. As before, there is background advanced chronic small vessel ischemic disease within the cerebral white matter. Wallerian degeneration of the left cerebral peduncle. No evidence of acute infarct elsewhere within the brain. There are few scattered supratentorial chronic microhemorrhages. No abnormal intracranial enhancement. No extra-axial fluid collection. No midline shift. Vascular: Expected proximal arterial flow voids. Skull and upper cervical spine: No focal suspicious marrow lesion. There is precontrast T1 hyperintensity within the ventral C3 vertebral body, likely benign. Sinuses/Orbits: Visualized orbits show no acute finding. Mild ethmoid sinus mucosal thickening. No significant mastoid effusion. IMPRESSION: There is a punctate focus of restricted diffusion within the left parietal white matter without appreciable corresponding enhancement. This most likely reflects an acute or early subacute small vessel infarct, although attention is recommended on follow-up. No evidence of intracranial metastatic disease elsewhere. Multiple chronic infarcts as described, some of which are new as compared to the prior MRI of 03/21/2018. Stable background moderate generalized parenchymal atrophy and advanced chronic small vessel ischemic disease. Mild ethmoid sinus mucosal thickening. Electronically Signed   By: Kellie Simmering DO   On: 09/15/2019 15:07   MR PELVIS WO CONTRAST  Result Date: 09/14/2019 CLINICAL DATA:  Rectal mass with potential local invasion based on prior imaging EXAM: MRI PELVIS WITHOUT CONTRAST TECHNIQUE: Multiplanar multisequence MR imaging of the pelvis was  performed. No intravenous contrast was administered. Small amount of [place] gel was administered per rectum to optimize tumor evaluation. COMPARISON:  09/13/2019 FINDINGS: TUMOR LOCATION Tumor distance from Anal Verge/Skin Surface:  13.3 cm Tumor distance to Internal Anal Sphincter: 5.9 cm TUMOR DESCRIPTION Circumferential Extent: Circumferential tumor in the upper margin of the tumor. The tumor favors the RIGHT anterolateral rectum and is eccentric towards the inferior margin. Tumor Length: 8.2 cm  T - CATEGORY Extension through Muscularis Propria: Yes, approximately 1.5-1.6 cm beyond the confines of the rectum in the axial plane. At least 6-7 mm beyond in the sagittal. Note that bulging of the anterior rectum with a maintained defined T2 hypointense rim is noted along the RIGHT anterolateral margin. Shortest Distance of any tumor/node from Mesorectal Fascia: 0 mm Extramural Vascular Invasion/Tumor Thrombus: Yes expanded low signal vein arising from an area of extra rectal extension along the LEFT lateral rectal margin (image 20, series 8) also on image 23 of series 9. Expanded vein with low signal also demonstrated on image 31 of series 8 Invasion of Anterior Peritoneal Reflection: Yes Involvement of Adjacent Organs or Pelvic Sidewall: Yes, invasion of the seminal vesicles best seen on image 21 of series 8, tumor passes through the anterior peritoneal reflection at this level extending to the seminal vesicles greatest on the RIGHT just to the RIGHT of midline. Levator Ani Involvement: No N - CATEGORY Mesorectal Lymph Nodes >=36mm: N2 with multiple lymph nodes in the mesorectum extending high along the superior rectal vein and into the nodal chains along the inferior IMV as seen on the prior CT. Greater than 4 lymph nodes meet this criteria Extra-mesorectal Lymphadenopathy: Small lymph nodes along venous drainage pathways and lymphatic pathways above the mesorectum, no pelvic sidewall adenopathy. Other: Urinary Tract:  Urinary bladder is grossly normal. No signs of distal ureteral dilation. Bowel: As above Vascular/Lymphatic: Lymphatic assessment as described. Reproductive: Seminal vesicle involvement as discussed. Other: None Musculoskeletal: Limited assessment of musculoskeletal structures is unremarkable. 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 Rectal adenocarcinoma N stage: N2 with superior rectal and nodes about the sacral promontory and just above, bordering on nodes outside of regional nodal stations. Distance from tumor to the internal anal sphincter is 13.3 cm. Electronically Signed   By: Zetta Bills M.D.   On: 09/14/2019 08:18   CT ABDOMEN PELVIS W CONTRAST  Result Date: 09/13/2019 CLINICAL DATA:  69 year old male with history of colorectal cancer. Staging examination. EXAM: CT CHEST, ABDOMEN, AND PELVIS WITH CONTRAST TECHNIQUE: Multidetector CT imaging of the chest, abdomen and pelvis was performed following the standard protocol during bolus administration of intravenous contrast. CONTRAST:  143mL OMNIPAQUE IOHEXOL 300 MG/ML  SOLN COMPARISON:  No priors. FINDINGS: CT CHEST FINDINGS Cardiovascular: Heart size is normal. There is no significant pericardial fluid, thickening or pericardial calcification. There is aortic atherosclerosis, as well as atherosclerosis of the great vessels of the mediastinum and the coronary arteries, including calcified atherosclerotic plaque in the ramus intermedius coronary artery. Mediastinum/Nodes: No pathologically enlarged mediastinal or hilar lymph nodes. Esophagus is unremarkable in appearance. No axillary lymphadenopathy. Lungs/Pleura: Dependent areas of subsegmental atelectasis are noted in the lower lobes of the lungs bilaterally. No acute consolidative airspace disease. No pleural effusions. A few scattered tiny 2-3 mm pulmonary nodules are noted in the lungs bilaterally, nonspecific. No larger more suspicious appearing pulmonary  nodules or masses are noted. Musculoskeletal: There are no aggressive appearing lytic or blastic lesions noted in the visualized portions of the skeleton. CT ABDOMEN PELVIS FINDINGS Hepatobiliary: No suspicious cystic or solid hepatic lesions. No intra or extrahepatic biliary ductal dilatation. Gallbladder is normal in appearance. Pancreas: No pancreatic mass. No pancreatic ductal dilatation. No pancreatic or peripancreatic fluid collections or inflammatory changes. Spleen: Unremarkable. Adrenals/Urinary Tract: Tiny subcentimeter low-attenuation lesions in the right kidney, too small to definitively characterize, but statistically likely to represent tiny cysts. Left kidney and bilateral adrenal glands are normal  in appearance. No hydroureteronephrosis. Urinary bladder is normal in appearance. Stomach/Bowel: Normal appearance of the stomach. No pathologic dilatation of small bowel or colon. Masslike irregular mural thickening of the rectum which is difficult to discretely measure given its irregular appearance, but is best demonstrated on axial image 116 of series 504, presumably a primary rectal neoplasm. Notably, this obliterates the fat plane in the mesorectal fat between the lesion and the right seminal vesicle (best appreciated on axial image 116 of series 504). The appendix is not confidently identified and may be surgically absent. Regardless, there are no inflammatory changes noted adjacent to the cecum to suggest the presence of an acute appendicitis at this time. Vascular/Lymphatic: Aortic atherosclerosis, without evidence of aneurysm or dissection in the abdominal or pelvic vasculature. Several prominent but nonenlarged mesorectal lymph nodes are noted measuring up to 7 mm in short axis. No other lymphadenopathy noted in the abdomen or pelvis. Reproductive: There is some obscuration of the fat plane between the right seminal vesicle and the rectal mass (discussed above). Prostate gland and seminal  vesicles are otherwise unremarkable in appearance. Other: No significant volume of ascites.  No pneumoperitoneum. Musculoskeletal: There are no aggressive appearing lytic or blastic lesions noted in the visualized portions of the skeleton. IMPRESSION: 1. Rectal mass with evidence of local invasion into the mesorectal fat and potential involvement of the right seminal vesicle. Multiple borderline enlarged mesorectal lymph nodes. No other definite evidence of distal metastatic disease elsewhere in the abdomen or pelvis. 2. Small pulmonary nodules measuring 2-3 mm in the lungs bilaterally, highly nonspecific and statistically likely benign. However, close attention on follow-up studies is recommended to ensure their stability as metastatic disease is not entirely excluded. 3. Aortic atherosclerosis, in addition to single-vessel coronary artery disease. Assessment for potential risk factor modification, dietary therapy or pharmacologic therapy may be warranted, if clinically indicated. 4. Additional incidental findings, as above. Electronically Signed   By: Vinnie Langton M.D.   On: 09/13/2019 19:56   US Venous Img Upper Uni Right  Result Date: 09/26/2019 CLINICAL DATA:  Right upper extremity edema. EXAM: RIGHT UPPER EXTREMITY VENOUS DOPPLER ULTRASOUND TECHNIQUE: Gray-scale sonography with graded compression, as well as color Doppler and duplex ultrasound were performed to evaluate the upper extremity deep venous system from the level of the subclavian vein and including the jugular, axillary, basilic, radial, ulnar and upper cephalic vein. Spectral Doppler was utilized to evaluate flow at rest and with distal augmentation maneuvers. COMPARISON:  None. FINDINGS: Contralateral Subclavian Vein: Respiratory phasicity is normal and symmetric with the symptomatic side. No evidence of thrombus. Normal compressibility. Internal Jugular Vein: No evidence of thrombus. Normal compressibility, respiratory phasicity and  response to augmentation. Subclavian Vein: No evidence of thrombus. Normal compressibility, respiratory phasicity and response to augmentation. Axillary Vein: No evidence of thrombus. Normal compressibility, respiratory phasicity and response to augmentation. Cephalic Vein: Thrombus is identified in the right cephalic vein beginning just above the wrist and continuing to just above the antecubital fossa. Basilic Vein: No evidence of thrombus. Normal compressibility, respiratory phasicity and response to augmentation. Brachial Veins: No evidence of thrombus. Normal compressibility, respiratory phasicity and response to augmentation. Radial Veins: No evidence of thrombus. Normal compressibility, respiratory phasicity and response to augmentation. Ulnar Veins: No evidence of thrombus. Normal compressibility, respiratory phasicity and response to augmentation. Venous Reflux:  None visualized. Other Findings:  No abnormal fluid collections identified. IMPRESSION: 1. No evidence deep vein thrombosis in the right upper extremity. 2. Positive for superficial thrombophlebitis of  the cephalic vein from just above the wrist to just proximal to the antecubital fossa. Electronically Signed   By: Aletta Edouard M.D.   On: 09/26/2019 16:00   DG CHEST PORT 1 VIEW  Result Date: 09/15/2019 CLINICAL DATA:  69 year old male status post Port-A-Cath placement EXAM: PORTABLE CHEST 1 VIEW COMPARISON:  Chest radiograph dated 03/21/2018 and CT dated 09/13/2019 FINDINGS: Right-sided Port-A-Cath with tip at the cavoatrial junction. There is mild cardiomegaly with mild vascular congestion. No focal consolidation or pneumothorax. Trace right pleural effusion may be present. Atherosclerotic calcification of the aorta. No acute osseous pathology. IMPRESSION: Port-A-Cath with tip at the cavoatrial junction. Electronically Signed   By: Anner Crete M.D.   On: 09/15/2019 17:40   DG C-Arm 1-60 Min-No Report  Result Date:  09/15/2019 Fluoroscopy was utilized by the requesting physician.  No radiographic interpretation.     Assessment and plan 1. Cognitive impairment   2. Rectal cancer ([place])   3. Goals of care, counseling/discussion   4. Encounter for antineoplastic chemotherapy   5. B12 deficiency   6. 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 Recommend neoadjuvant dose reduced FOLFOX followed by concurrent chemotherapy radiation followed by surgery if he becomes a candidate for surgery at that point.  If bleeding becomes an issue for him, will move radiation up in the treatment course for symptom relief. I discussed about antiemetics instructions and also wrote on paperwork provided by patient's facility.  Printed prescription will be sent to facility. Labs reviewed and discussed with patient.  Counts acceptable to proceed with cycle 1 dose reduced FOLFOX.  Bolus 5-FU is omitted due to cytopenia.  #Iron deficiency anemia, hemoglobin has improved to 9.3 today.  Proceed with IV Venofer 200 mg x 1.  #H. pylori gastritis, patient to finish course of antibiotics and PPI #Vitamin B12 deficiency, B12 level has improved.  Continue with B12 supplementation. #Right upper extremity superficial thrombophlebitis. Discussed with patient/family members that superficial blood clots rarely progresses to deep vein thrombosis and patient is not symptomatic at this point.  No blood thinner is indicated at this point.  Continue monitor symptoms.  Recommend cold or warm compresses.  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  Vitamin B12 deficiency may contribute to that and is being  treated for B12 deficiency. #Patient will establish care with palliative care service. We spent sufficient time to discuss many aspect of care, questions were answered to patient's satisfaction. Patient will follow up in 1 week for evaluation of treatment tolerability and follow-up in 2 weeks for evaluation prior to next cycle of treatment. [name], MD, PhD Hematology Oncology [facility] at [facility] Pager- [phone] 09/27/2019

## 2019-09-27 NOTE — Progress Notes (Signed)
Patient denies new problems/concerns today.   °

## 2019-09-27 NOTE — Telephone Encounter (Signed)
Patient finished infusion around 2:15pm, was [facility] to [facility] with [facility]. Both Sandy & I called multiple times to get them to pick up patient from [facility] - no answer.  Patient waited 45 mins in our lobby and got the ok by Aida Puffer) to take him back to [facility] on our bus by [place].   Called the nephew around 3:20pm to update him. He stated after leaving appts today here with patient he stopped at [facility] to discontinue transportation with them, due to him talking to Transportation [facility]) stating patient will be taken care of.  Pilar Plate (transportation) stated to Registration that we will no longer be doing transportation for this patient and that [facility] will be. He reached out to his boss - [employer] and didn't hear back.   Misunderstanding within transportation

## 2019-09-29 ENCOUNTER — Inpatient Hospital Stay: Payer: Medicare Other | Attending: Oncology

## 2019-09-29 ENCOUNTER — Telehealth: Payer: Self-pay | Admitting: Primary Care

## 2019-09-29 ENCOUNTER — Other Ambulatory Visit: Payer: Self-pay

## 2019-09-29 ENCOUNTER — Ambulatory Visit
Admission: RE | Admit: 2019-09-29 | Discharge: 2019-09-29 | Disposition: A | Discharge status: Home / Self Care | Payer: Medicare Other | Source: Ambulatory Visit | Attending: Radiation Oncology | Admitting: Radiation Oncology

## 2019-09-29 VITALS — Wt 226.0 lb

## 2019-09-29 DIAGNOSIS — I808 Phlebitis and thrombophlebitis of other sites: Secondary | ICD-10-CM | POA: Insufficient documentation

## 2019-09-29 DIAGNOSIS — C20 Malignant neoplasm of rectum: Secondary | ICD-10-CM | POA: Insufficient documentation

## 2019-09-29 DIAGNOSIS — Z79899 Other long term (current) drug therapy: Secondary | ICD-10-CM | POA: Insufficient documentation

## 2019-09-29 DIAGNOSIS — Z8249 Family history of ischemic heart disease and other diseases of the circulatory system: Secondary | ICD-10-CM | POA: Insufficient documentation

## 2019-09-29 DIAGNOSIS — D500 Iron deficiency anemia secondary to blood loss (chronic): Secondary | ICD-10-CM | POA: Insufficient documentation

## 2019-09-29 DIAGNOSIS — K921 Melena: Secondary | ICD-10-CM | POA: Insufficient documentation

## 2019-09-29 DIAGNOSIS — R5383 Other fatigue: Secondary | ICD-10-CM | POA: Insufficient documentation

## 2019-09-29 DIAGNOSIS — I700 Atherosclerosis of aorta: Secondary | ICD-10-CM | POA: Insufficient documentation

## 2019-09-29 DIAGNOSIS — Z806 Family history of leukemia: Secondary | ICD-10-CM | POA: Insufficient documentation

## 2019-09-29 DIAGNOSIS — R4189 Other symptoms and signs involving cognitive functions and awareness: Secondary | ICD-10-CM | POA: Insufficient documentation

## 2019-09-29 DIAGNOSIS — E538 Deficiency of other specified B group vitamins: Secondary | ICD-10-CM | POA: Diagnosis not present

## 2019-09-29 DIAGNOSIS — I2510 Atherosclerotic heart disease of native coronary artery without angina pectoris: Secondary | ICD-10-CM | POA: Insufficient documentation

## 2019-09-29 DIAGNOSIS — Z823 Family history of stroke: Secondary | ICD-10-CM | POA: Insufficient documentation

## 2019-09-29 DIAGNOSIS — Z5111 Encounter for antineoplastic chemotherapy: Secondary | ICD-10-CM | POA: Insufficient documentation

## 2019-09-29 MED ORDER — HEPARIN SOD (PORK) LOCK FLUSH 100 UNIT/ML IV SOLN
INTRAVENOUS | Status: AC
  Filled 2019-09-29: qty 5

## 2019-09-29 MED ORDER — SODIUM CHLORIDE 0.9% FLUSH
10.0000 mL | INTRAVENOUS | Status: DC | PRN
  Administered 2019-09-29: 10 mL
  Filled 2019-09-29: qty 10

## 2019-09-29 MED ORDER — HEPARIN SOD (PORK) LOCK FLUSH 100 UNIT/ML IV SOLN
500.0000 [IU] | Freq: Once | INTRAVENOUS | Status: AC | PRN
  Administered 2019-09-29: 500 [IU]
  Filled 2019-09-29: qty 5

## 2019-09-29 NOTE — Telephone Encounter (Signed)
Phone call to Sheppard Luckenbach, nephew, to discuss palliative services. Received call from cancer center for palliative referral at [facility]. [name] stated pt  goals were to treat at least initially to see if patient would be responsive. During the phone call it became apparent that he equated palliative services with hospice. I was able to clarify the role of palliative medicine versus hospice in that at this time Palliative medicine was appropriate to follow him during serious illness and treatment. [name] stated he had not understood the delineation and very much would like to have palliative services involved. He gave verbal consent for start of services.

## 2019-09-29 NOTE — Consult Note (Signed)
NEW PATIENT EVALUATION  Name: Christopher Delacruz  MRN: [id number]  Date:   09/29/2019     DOB: 11-Aug-1950   This 69 y.o. 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  REFERRING PHYSICIAN: Donnie Coffin, MD  CHIEF COMPLAINT:  Chief Complaint  Patient presents with  . Cancer    Initial consultation    DIAGNOSIS: There were no encounter diagnoses.   PREVIOUS INVESTIGATIONS:  CT scans of abdomen pelvis as well as MR scans of the pelvis all reviewed compatible with above-stated findings Clinical notes reviewed Pathology report reviewed  HPI: Patient is a 69 year old male who initially presented to the ER with profound anemia.  He was noted in August on upper endoscopy to have a duodenal ulcer without bleeding.  He also underwent lower endoscopy showing a malignant partially starting tumor in the rectosigmoid colon which was biopsied.  Biopsy was positive for moderately differentiated adenocarcinoma.  CT scan confirmed rectal mass with left with evidence of local invasion into the mesorectal fat and possible involving the right seminal vesicle.  There were multiple enlarged pelvic lymph nodes.  MRI of the pelvis confirmed a T4BN2 lesion.  Brain MRI showed no evidence to suggest metastatic disease did have some signs of early acute small vessel infarct.  Patient is in former employee of [employer].  Is also been treated for iron deficiency anemia and B12 deficiency.  He has been seen by medical oncology and is to start on TNT chemotherapy.  He is now referred to [facility] [employer] for opinion he is doing well specifically denies any significant abdominal or pelvic pain.  Does have occasional intermittent diarrhea and still some minor rectal bleeding.  PLANNED TREATMENT REGIMEN: TNT plus concurrent chemoradiation therapy and then possible surgical resection excision  PAST MEDICAL HISTORY:  has a past medical history of B12 deficiency (09/17/2019),  CVA (cerebral vascular accident) ([place]), Diabetes mellitus ([place]), Hyperlipidemia, and Hypertension.    PAST SURGICAL HISTORY:  Past Surgical History:  Procedure Laterality Date  . COLONOSCOPY N/A 09/11/2019   Procedure: COLONOSCOPY;  Surgeon: [name], MD;  Location: [facility] ENDOSCOPY;  Service: Endoscopy;  Laterality: N/A;  . COLONOSCOPY WITH PROPOFOL N/A 09/12/2019   Procedure: COLONOSCOPY WITH PROPOFOL;  Surgeon: [name], MD;  Location: [facility] ENDOSCOPY;  Service: Gastroenterology;  Laterality: N/A;  . COLONOSCOPY WITH PROPOFOL N/A 09/13/2019   Procedure: COLONOSCOPY WITH PROPOFOL;  Surgeon: [name], MD;  Location: [facility] ENDOSCOPY;  Service: Gastroenterology;  Laterality: N/A;  . ESOPHAGOGASTRODUODENOSCOPY N/A 09/11/2019   Procedure: ESOPHAGOGASTRODUODENOSCOPY (EGD);  Surgeon: [name], MD;  Location[facility] ENDOSCOPY;  Service: Endoscopy;  Laterality: N/A;  . EXPLORE EYE SOCKET    . PORTACATH PLACEMENT N/A 09/15/2019   Procedure: INSERTION PORT-A-CATH;  Surgeon: Jules Husbands, MD;  Location: [facility] ORS;  Service: General;  Laterality: N/A;    FAMILY HISTORY: family history includes Heart attack in his sister; Leukemia in his brother; Stroke in his brother.  SOCIAL HISTORY:  reports that he has never smoked. He has never used smokeless tobacco. He reports that he does not drink alcohol and does not use drugs.  ALLERGIES: Patient has no known allergies.  MEDICATIONS:  Current Outpatient Medications  Medication Sig Dispense Refill  . amLODipine (NORVASC) 10 MG tablet Take 1 tablet (10 mg total) by mouth daily. 30 tablet 1  . amoxicillin (AMOXIL) 500 MG tablet Take 500 mg by mouth 2 (two) times daily.    Marland Kitchen aspirin EC  81 MG EC tablet Take 1 tablet (81 mg total) by mouth daily. Swallow whole. 30 tablet 11  . clarithromycin (BIAXIN) 500 MG tablet Take 500 mg by mouth 2 (two) times daily.    . clopidogrel (PLAVIX) 75 MG tablet Take 1 tablet (75 mg total) by mouth daily. 30 tablet 1  .  ferrous sulfate 325 (65 FE) MG tablet Take 1 tablet (325 mg total) by mouth daily. 30 tablet 3  . finasteride (PROSCAR) 5 MG tablet Take 1 tablet (5 mg total) by mouth daily. 30 tablet 0  . Multiple Vitamin (MULTIVITAMIN WITH MINERALS) TABS tablet Take 1 tablet by mouth daily. 30 tablet 0  . ondansetron (ZOFRAN) 8 MG tablet Take 1 tablet (8 mg total) by mouth 2 (two) times daily as needed for refractory nausea / vomiting. Start on day 3 after chemotherapy. 30 tablet 1  . prochlorperazine (COMPAZINE) 10 MG tablet Take 1 tablet (10 mg total) by mouth every 6 (six) hours as needed (Nausea or vomiting). 30 tablet 1  . tamsulosin (FLOMAX) 0.4 MG CAPS capsule Take 1 capsule (0.4 mg total) by mouth daily. 30 capsule 11  . vitamin B-12 1000 MCG tablet Take 1 tablet (1,000 mcg total) by mouth daily. 30 tablet 0  . lidocaine-prilocaine (EMLA) cream Apply to affected area once (Patient not taking: Reported on 09/27/2019) 30 g 3  . pantoprazole (PROTONIX) 40 MG tablet Take 1 tablet (40 mg total) by mouth 2 (two) times daily before a meal for 10 days. 20 tablet 0   No current facility-administered medications for this encounter.   Facility-Administered Medications Ordered in Other Encounters  Medication Dose Route Frequency Provider Last Rate Last Admin  . sodium chloride flush (NS) 0.9 % injection 10 mL  10 mL Intracatheter PRN [name], MD   10 mL at 09/29/19 1258    ECOG PERFORMANCE STATUS:  1 - Symptomatic but completely ambulatory  REVIEW OF SYSTEMS: Patient has B12 deficiency and iron deficiency anemia. Patient denies any weight loss, fatigue, weakness, fever, chills or night sweats. Patient denies any loss of vision, blurred vision. Patient denies any ringing  of the ears or hearing loss. No irregular heartbeat. Patient denies heart murmur or history of fainting. Patient denies any chest pain or pain radiating to her upper extremities. Patient denies any shortness of breath, difficulty breathing at  night, cough or hemoptysis. Patient denies any swelling in the lower legs. Patient denies any nausea vomiting, vomiting of blood, or coffee ground material in the vomitus. Patient denies any stomach pain. Patient states has had normal bowel movements no significant constipation or diarrhea. Patient denies any dysuria, hematuria or significant nocturia. Patient denies any problems walking, swelling in the joints or loss of balance. Patient denies any skin changes, loss of hair or loss of weight. Patient denies any excessive worrying or anxiety or significant depression. Patient denies any problems with insomnia. Patient denies excessive thirst, polyuria, polydipsia. Patient denies any swollen glands, patient denies easy bruising or easy bleeding. Patient denies any recent infections, allergies or URI. Patient "s visual fields have not changed significantly in recent time.   PHYSICAL EXAM: Wt 226 lb (102.5 kg)   BMI 29.32 kg/m  Well-developed well-nourished patient in NAD. HEENT reveals PERLA, EOMI, discs not visualized.  Oral cavity is clear. No oral mucosal lesions are identified. Neck is clear without evidence of cervical or supraclavicular adenopathy. Lungs are clear to A&P. Cardiac examination is essentially unremarkable with regular rate and rhythm without murmur rub  or thrill. Abdomen is benign with no organomegaly or masses noted. Motor sensory and DTR levels are equal and symmetric in the upper and lower extremities. Cranial nerves II through XII are grossly intact. Proprioception is intact. No peripheral adenopathy or edema is identified. No motor or sensory levels are noted. Crude visual fields are within normal range.  LABORATORY DATA: Pathology report reviewed    RADIOLOGY RESULTS: MRI scan and CT scans all reviewed compatible with above-stated findings   IMPRESSION: Stage III locally advanced adenocarcinoma the rectum in 69 year old male for initial TNT followed by concurrent chemoradiation  therapy in a neoadjuvant fashion in anticipation of surgical resection.  PLAN: This time patient will undergo TNT treatment with medical oncology.  I will then see him back in follow-up to contemplate concurrent chemoradiation I would plan on delivering 4500 centigrade to his pelvis boosting the area of tumor involvement of another 540 centigrade in a neoadjuvant fashion with concurrent chemotherapy risks and benefits of radiation including possible increase in lower urinary tract symptoms diarrhea fatigue alteration of blood count skin reaction all were reviewed in detail with the patient he comprehends her treatment plan well.  See him back in follow-up to discuss current chemoradiation after his TNT is complete.  Should his bleeding from the rectum become a problem a opt to go ahead with concurrent chemoradiation more urgently to try to decrease the rectal bleeding.  I would like to take this opportunity to thank you for allowing me to participate in the care of your patient.[name], MD

## 2019-10-05 ENCOUNTER — Non-Acute Institutional Stay: Payer: Medicare Other | Admitting: Primary Care

## 2019-10-05 ENCOUNTER — Other Ambulatory Visit: Payer: Self-pay

## 2019-10-05 DIAGNOSIS — Z515 Encounter for palliative care: Secondary | ICD-10-CM

## 2019-10-05 DIAGNOSIS — D490 Neoplasm of unspecified behavior of digestive system: Secondary | ICD-10-CM

## 2019-10-05 NOTE — Progress Notes (Signed)
[facility] Consult Note Telephone: [phone]  Fax: [phone]  PATIENT NAME: Christopher Delacruz [place] [address] [phone] (home)  DOB: Aug 26, 1950 MRN: [id number]  PRIMARY CARE PROVIDER:    Donnie Coffin, MD,  [place] [address] [phone]  REFERRING PROVIDER:   Donnie Coffin, MD [place],  [place] [address] [phone]  RESPONSIBLE PARTY:   Extended Emergency Contact Information Primary Emergency Contact: [place] Mobile Phone: [phone] Relation: Nephew  I met face to face with patient in the facility.  ASSESSMENT AND RECOMMENDATIONS:   1. Advance Care Planning/Goals of Care: Goals include to maximize quality of life and symptom management. States he is feeling well and comfortable. I had spoken with POA last week to establish goals, which include treating the treatable.    2. Symptom Management:  Currently denies sx to manage, denies pain, insomnia or early satiety.  His port site is tender but looks intact, no signs infection.  Staff endorse he can ambulate well (I) but may need cueing for adls. He is here for rehab after dx and rx for colon cancer. He will receive chemo and radiation, and f/u with oncology over next week. I will continue to monitor for sx and goals of care.   3. Follow up Palliative Care Visit: Palliative care will continue to follow for goals of care clarification and symptom management. Return 4 weeks or prn.  4. Family /Caregiver/Community Supports: Nephew is POA, had been living in community, now at SNF, disposition TBD.  5. Cognitive / Functional decline: A and O x 2, able to ambulate to bathroom, feeds self.  I spent 35 minutes providing this consultation,  from 1600 to 1635. More than 50% of the time in this consultation was spent coordinating communication.   CHIEF COMPLAINT:weakness, goals of care  HISTORY OF  PRESENT ILLNESS:  Christopher Delacruz is a 69 y.o. year old male with multiple medical problems including h/o rectal bleed, colon cancer, CVA, with R weakness, CKD 3, anemia . Palliative Care was asked to follow this patient by consultation of [name], MD  to help address advance care planning and goals of care. This is the initial visit.  CODE STATUS: TBD  PPS: 50%  HOSPICE ELIGIBILITY/DIAGNOSIS: no  PAST MEDICAL HISTORY:  Past Medical History:  Diagnosis Date  . B12 deficiency 09/17/2019  . CVA (cerebral vascular accident) ([place])   . Diabetes mellitus ([place])   . Hyperlipidemia   . Hypertension     SOCIAL HX:  Social History   Tobacco Use  . Smoking status: Never Smoker  . Smokeless tobacco: Never Used  Substance Use Topics  . Alcohol use: Never    Alcohol/week: 0.0 standard drinks   FAMILY HX:  Family History  Problem Relation Age of Onset  . Heart attack Sister   . Leukemia Brother   . Stroke Brother     ALLERGIES: No Known Allergies   PERTINENT MEDICATIONS:  Outpatient Encounter Medications as of 10/05/2019  Medication Sig  . amLODipine (NORVASC) 10 MG tablet Take 1 tablet (10 mg total) by mouth daily.  Marland Kitchen aspirin EC 81 MG EC tablet Take 1 tablet (81 mg total) by mouth daily. Swallow whole.  . clopidogrel (PLAVIX) 75 MG tablet Take 1 tablet (75 mg total) by mouth daily.  . ferrous sulfate 325 (65 FE) MG tablet Take 1 tablet (325 mg total) by mouth daily.  . finasteride (PROSCAR)  5 MG tablet Take 1 tablet (5 mg total) by mouth daily.  Marland Kitchen lidocaine-prilocaine (EMLA) cream Apply to affected area once (Patient not taking: Reported on 09/27/2019)  . Multiple Vitamin (MULTIVITAMIN WITH MINERALS) TABS tablet Take 1 tablet by mouth daily.  . ondansetron (ZOFRAN) 8 MG tablet Take 1 tablet (8 mg total) by mouth 2 (two) times daily as needed for refractory nausea / vomiting. Start on day 3 after chemotherapy.  . pantoprazole (PROTONIX) 40 MG tablet Take 1 tablet (40 mg total) by  mouth 2 (two) times daily before a meal for 10 days.  . prochlorperazine (COMPAZINE) 10 MG tablet Take 1 tablet (10 mg total) by mouth every 6 (six) hours as needed (Nausea or vomiting).  . tamsulosin (FLOMAX) 0.4 MG CAPS capsule Take 1 capsule (0.4 mg total) by mouth daily.  . vitamin B-12 1000 MCG tablet Take 1 tablet (1,000 mcg total) by mouth daily.   No facility-administered encounter medications on file as of 10/05/2019.    PHYSICAL EXAM / ROS:   Current and past weights: 225 lbs reported in epic chart General: NAD,wnwd Cardiovascular: S1, s2, no chest pain reported, no  Le edema  Pulmonary: lungs CTA,  no cough, no increased SOB, room air Abdomen: appetite very good, denies constipation, continent of bowel GU: denies dysuria, continent of urine MSK:  no joint and ROM abnormalities, ambulatory without device Skin: port in right chest, site healing, sore to touch Neurological: mild R Weakness, a and o x 2, h/o cognitive impairment.  [name], NP , DNP, MPH, [facility]  COVID-19 PATIENT SCREENING TOOL  Person answering questions: ____________staff______ _____   1.  Is the patient or any family member in the home showing any signs or symptoms regarding respiratory infection?               Person with Symptom- __________NA_________________  a. Fever                                                                          Yes___ No___          ___________________  b. Shortness of breath                                                    Yes___ No___          ___________________ c. Cough/congestion                                       Yes___  No___         ___________________ d. Body aches/pains                                                         Yes___ No___        ____________________ e. Gastrointestinal symptoms (  diarrhea, nausea)           Yes___ No___        ____________________  2. Within the past 14 days, has anyone living in the home had any contact with  someone with or under investigation for COVID-19?    Yes___ No_X_   Person __________________

## 2019-10-06 ENCOUNTER — Other Ambulatory Visit: Payer: Self-pay

## 2019-10-06 ENCOUNTER — Encounter: Payer: Self-pay | Admitting: Oncology

## 2019-10-06 ENCOUNTER — Inpatient Hospital Stay: Payer: Medicare Other

## 2019-10-06 ENCOUNTER — Inpatient Hospital Stay (HOSPITAL_BASED_OUTPATIENT_CLINIC_OR_DEPARTMENT_OTHER): Payer: Medicare Other | Admitting: Oncology

## 2019-10-06 VITALS — BP 154/72 | HR 57 | Temp 95.9°F | Resp 18 | Wt 231.6 lb

## 2019-10-06 VITALS — BP 156/73 | HR 60 | Temp 96.0°F | Resp 18

## 2019-10-06 DIAGNOSIS — D500 Iron deficiency anemia secondary to blood loss (chronic): Secondary | ICD-10-CM | POA: Diagnosis not present

## 2019-10-06 DIAGNOSIS — C20 Malignant neoplasm of rectum: Secondary | ICD-10-CM

## 2019-10-06 DIAGNOSIS — E538 Deficiency of other specified B group vitamins: Secondary | ICD-10-CM

## 2019-10-06 DIAGNOSIS — I808 Phlebitis and thrombophlebitis of other sites: Secondary | ICD-10-CM

## 2019-10-06 DIAGNOSIS — Z5111 Encounter for antineoplastic chemotherapy: Secondary | ICD-10-CM | POA: Diagnosis not present

## 2019-10-06 LAB — CBC WITH DIFFERENTIAL/PLATELET
Abs Immature Granulocytes: 0.03 10*3/uL (ref 0.00–0.07)
Basophils Absolute: 0.1 10*3/uL (ref 0.0–0.1)
Basophils Relative: 1 %
Eosinophils Absolute: 0.3 10*3/uL (ref 0.0–0.5)
Eosinophils Relative: 3 %
HCT: 31.6 % — ABNORMAL LOW (ref 39.0–52.0)
Hemoglobin: 9.8 g/dL — ABNORMAL LOW (ref 13.0–17.0)
Immature Granulocytes: 0 %
Lymphocytes Relative: 22 %
Lymphs Abs: 2.1 10*3/uL (ref 0.7–4.0)
MCH: 24.8 pg — ABNORMAL LOW (ref 26.0–34.0)
MCHC: 31 g/dL (ref 30.0–36.0)
MCV: 80 fL (ref 80.0–100.0)
Monocytes Absolute: 0.9 10*3/uL (ref 0.1–1.0)
Monocytes Relative: 9 %
Neutro Abs: 6.4 10*3/uL (ref 1.7–7.7)
Neutrophils Relative %: 65 %
Platelets: 321 10*3/uL (ref 150–400)
RBC: 3.95 MIL/uL — ABNORMAL LOW (ref 4.22–5.81)
RDW: 25 % — ABNORMAL HIGH (ref 11.5–15.5)
WBC: 9.7 10*3/uL (ref 4.0–10.5)
nRBC: 0 % (ref 0.0–0.2)

## 2019-10-06 LAB — COMPREHENSIVE METABOLIC PANEL
ALT: 29 U/L (ref 0–44)
AST: 24 U/L (ref 15–41)
Albumin: 3.5 g/dL (ref 3.5–5.0)
Alkaline Phosphatase: 75 U/L (ref 38–126)
Anion gap: 8 (ref 5–15)
BUN: 17 mg/dL (ref 8–23)
CO2: 28 mmol/L (ref 22–32)
Calcium: 8.8 mg/dL — ABNORMAL LOW (ref 8.9–10.3)
Chloride: 107 mmol/L (ref 98–111)
Creatinine, Ser: 1.45 mg/dL — ABNORMAL HIGH (ref 0.61–1.24)
GFR calc Af Amer: 56 mL/min — ABNORMAL LOW (ref >60)
GFR calc non Af Amer: 48 mL/min — ABNORMAL LOW (ref >60)
Glucose, Bld: 109 mg/dL — ABNORMAL HIGH (ref 70–99)
Potassium: 4.5 mmol/L (ref 3.5–5.1)
Sodium: 143 mmol/L (ref 135–145)
Total Bilirubin: 0.5 mg/dL (ref 0.3–1.2)
Total Protein: 6.9 g/dL (ref 6.5–8.1)

## 2019-10-06 MED ORDER — HEPARIN SOD (PORK) LOCK FLUSH 100 UNIT/ML IV SOLN
500.0000 [IU] | Freq: Once | INTRAVENOUS | Status: AC | PRN
  Administered 2019-10-06: 500 [IU]
  Filled 2019-10-06: qty 5

## 2019-10-06 MED ORDER — IRON SUCROSE 20 MG/ML IV SOLN
200.0000 mg | Freq: Once | INTRAVENOUS | Status: AC
  Administered 2019-10-06: 200 mg via INTRAVENOUS
  Filled 2019-10-06: qty 10

## 2019-10-06 MED ORDER — SODIUM CHLORIDE 0.9 % IV SOLN
Freq: Once | INTRAVENOUS | Status: AC
  Filled 2019-10-06: qty 250

## 2019-10-06 MED ORDER — SODIUM CHLORIDE 0.9% FLUSH
10.0000 mL | Freq: Once | INTRAVENOUS | Status: AC | PRN
  Administered 2019-10-06: 10 mL
  Filled 2019-10-06: qty 10

## 2019-10-06 MED ORDER — HEPARIN SOD (PORK) LOCK FLUSH 100 UNIT/ML IV SOLN
INTRAVENOUS | Status: AC
  Filled 2019-10-06: qty 5

## 2019-10-06 MED ORDER — SODIUM CHLORIDE 0.9 % IV SOLN: 200.0000 mg | Freq: Once | INTRAVENOUS | Status: DC

## 2019-10-06 NOTE — Progress Notes (Signed)
Hematology/Oncology Follow Up Note [facility]  Telephone:([phone] [phone]  Patient Care Team: [name], MD as PCP - General (Family Medicine) Borders, Kirt Boys, NP as Nurse Practitioner (Hospice and Palliative Medicine) [name], MD as Consulting Physician (Oncology) [name], MD as Referring Physician (Radiation Oncology) [name], NP as Nurse Practitioner   Name of the patient: Christopher Delacruz  [id number]  1950/10/01   REASON FOR VISIT  follow-up for rectal cancer  PERTINENT ONCOLOGY HISTORY Christopher Delacruz is a 69 y.o.[place] who has above oncology history reviewed by me today presented for follow up visit for management of locally advanced rectal cancer. -Initially presented to emergency room due to profound anemia. 09/11/2019 EGD showed duodenal erosions without bleeding 09/11/2019 colonoscopy preparation was poor.  Likely malignant partially obstructing tumor in the rectosigmoid colon area which was biopsied. 09/13/2019, repeat colonoscopy showed 12 mm polyps were found in the descending colon.  Polyp was sessile.  Polyp was resected and retrieved.  A polypoid ulcerated partially obstructing large mass was found from 10 to 20 cm proximal to the anus.  The mass was circumferential, measured 10 cm in length.  In addition the diameter measures 12 mm.  Oozing was present 09/11/2019, biopsy showed invasive colorectal adenocarcinoma.  Moderately differentiated. 09/13/2019, CT chest abdomen pelvis showed rectal wall mass with evidence of local invasion to the mesorectal fat and potential involvement of the right seminal vesicle.  Multiple enlarged lymph nodes.  Small lung nodules 2 to 3 mm in the lung bilaterally.  Highly nonspecific.  Cannot rule out metastasis.  09/14/2019 MRI pelvis showed T4b N2 rectal cancer. 09/15/2019 MRI brain with and without contrast showed punctate focus of restricted diffusion most likely reflects acute or  early subacute small vessel infarct.  No evidence of intracranial metastasis.  Multiple chronic infarct.  #Iron deficiency anemia, patient received PRBC transfusion and IV Venofer treatments. #Vitamin B12 deficiency, status post parenteral vitamin B12 injections during hospitalization.  Currently on vitamin B12 supplementation. #Patient was found to have very poor insight to his condition.  Was seen by psychiatrist and was deemed incompetent.  Patient's medical power of attorney is his nephew Christopher Delacruz.  #Mediport was placed by Dr. Linden Dolin L HISTORY 69 y.o. patient presents for follow-up of rectal cancer.Marland Kitchen  He was accompanied by his nephew Christopher Delacruz.  Christopher Delacruz is also his power of attorney. 09/26/2019. Status post cycle 1 dose reduced FOLFOX  Overall patient tolerates well.  Continue to have very small amount of blood in stool.  Appetite is good.  No nausea vomiting diarrhea. Patient appears to be more communicative interactive today.  Review of Systems  Unable to perform ROS: Other (Cognitive impairment)  Constitutional: Positive for fatigue.  Respiratory: Negative for shortness of breath.   Gastrointestinal: Positive for blood in stool. Negative for abdominal pain and constipation.      No Known Allergies   Past Medical History:  Diagnosis Date  . B12 deficiency 09/17/2019  . CVA (cerebral vascular accident) ([place])   . Diabetes mellitus ([place])   . Hyperlipidemia   . Hypertension      Past Surgical History:  Procedure Laterality Date  . COLONOSCOPY N/A 09/11/2019   Procedure: COLONOSCOPY;  Surgeon: [name], MD;  Location: [facility] ENDOSCOPY;  Service: Endoscopy;  Laterality: N/A;  . COLONOSCOPY WITH PROPOFOL N/A 09/12/2019   Procedure: COLONOSCOPY WITH PROPOFOL;  Surgeon: [name], MD;  Location: [facility] ENDOSCOPY;  Service: Gastroenterology;  Laterality: N/A;  . COLONOSCOPY WITH  PROPOFOL N/A 09/13/2019   Procedure: COLONOSCOPY WITH PROPOFOL;  Surgeon: [name], MD;  Location: [facility]  ENDOSCOPY;  Service: Gastroenterology;  Laterality: N/A;  . ESOPHAGOGASTRODUODENOSCOPY N/A 09/11/2019   Procedure: ESOPHAGOGASTRODUODENOSCOPY (EGD);  Surgeon: [name], MD;  Location: [facility] ENDOSCOPY;  Service: Endoscopy;  Laterality: N/A;  . EXPLORE EYE SOCKET    . PORTACATH PLACEMENT N/A 09/15/2019   Procedure: INSERTION PORT-A-CATH;  Surgeon: Jules Husbands, MD;  Location: [facility] ORS;  Service: General;  Laterality: N/A;    Social History   Socioeconomic History  . Marital status: Divorced    Spouse name: Not on file  . Number of children: Not on file  . Years of education: Not on file  . Highest education level: Not on file  Occupational History  . Not on file  Tobacco Use  . Smoking status: Never Smoker  . Smokeless tobacco: Never Used  Vaping Use  . Vaping Use: Never used  Substance and Sexual Activity  . Alcohol use: Never    Alcohol/week: 0.0 standard drinks  . Drug use: Never  . Sexual activity: Not on file  Other Topics Concern  . Not on file  Social History Narrative  . Not on file   Social Determinants of Health   Financial Resource Strain:   . Difficulty of Paying Living Expenses: Not on file  Food Insecurity:   . Worried About Charity fundraiser in the Last Year: Not on file  . Ran Out of Food in the Last Year: Not on file  Transportation Needs:   . Lack of Transportation (Medical): Not on file  . Lack of Transportation (Non-Medical): Not on file  Physical Activity:   . Days of Exercise per Week: Not on file  . Minutes of Exercise per Session: Not on file  Stress:   . Feeling of Stress : Not on file  Social Connections:   . Frequency of Communication with Friends and Family: Not on file  . Frequency of Social Gatherings with Friends and Family: Not on file  . Attends Religious Services: Not on file  . Active Member of Clubs or Organizations: Not on file  . Attends Archivist Meetings: Not on file  . Marital Status: Not on file  Intimate  Partner Violence:   . Fear of Current or Ex-Partner: Not on file  . Emotionally Abused: Not on file  . Physically Abused: Not on file  . Sexually Abused: Not on file    Family History  Problem Relation Age of Onset  . Heart attack Sister   . Leukemia Brother   . Stroke Brother      Current Outpatient Medications:  .  amLODipine (NORVASC) 10 MG tablet, Take 1 tablet (10 mg total) by mouth daily., Disp: 30 tablet, Rfl: 1 .  aspirin EC 81 MG EC tablet, Take 1 tablet (81 mg total) by mouth daily. Swallow whole., Disp: 30 tablet, Rfl: 11 .  clopidogrel (PLAVIX) 75 MG tablet, Take 1 tablet (75 mg total) by mouth daily., Disp: 30 tablet, Rfl: 1 .  ferrous sulfate 325 (65 FE) MG tablet, Take 1 tablet (325 mg total) by mouth daily., Disp: 30 tablet, Rfl: 3 .  finasteride (PROSCAR) 5 MG tablet, Take 1 tablet (5 mg total) by mouth daily., Disp: 30 tablet, Rfl: 0 .  Multiple Vitamin (MULTIVITAMIN WITH MINERALS) TABS tablet, Take 1 tablet by mouth daily., Disp: 30 tablet, Rfl: 0 .  ondansetron (ZOFRAN) 8 MG tablet, Take 1 tablet (8 mg  total) by mouth 2 (two) times daily as needed for refractory nausea / vomiting. Start on day 3 after chemotherapy., Disp: 30 tablet, Rfl: 1 .  prochlorperazine (COMPAZINE) 10 MG tablet, Take 1 tablet (10 mg total) by mouth every 6 (six) hours as needed (Nausea or vomiting)., Disp: 30 tablet, Rfl: 1 .  tamsulosin (FLOMAX) 0.4 MG CAPS capsule, Take 1 capsule (0.4 mg total) by mouth daily., Disp: 30 capsule, Rfl: 11 .  vitamin B-12 1000 MCG tablet, Take 1 tablet (1,000 mcg total) by mouth daily., Disp: 30 tablet, Rfl: 0 .  lidocaine-prilocaine (EMLA) cream, Apply to affected area once (Patient not taking: Reported on 09/27/2019), Disp: 30 g, Rfl: 3 .  pantoprazole (PROTONIX) 40 MG tablet, Take 1 tablet (40 mg total) by mouth 2 (two) times daily before a meal for 10 days., Disp: 20 tablet, Rfl: 0  Physical exam:  Vitals:   10/06/19 1028  BP: (!) 154/72  Pulse: (!) 57    Resp: 18  Temp: (!) 95.9 F (35.5 C)  Weight: 231 lb 9.6 oz (105.1 kg)   Physical Exam Constitutional:      General: He is not in acute distress.    Comments: Patient sits in the wheelchair.  HENT:     Head: Normocephalic and atraumatic.  Eyes:     General: No scleral icterus. Cardiovascular:     Rate and Rhythm: Normal rate and regular rhythm.     Heart sounds: Normal heart sounds.  Pulmonary:     Effort: Pulmonary effort is normal. No respiratory distress.     Breath sounds: No wheezing.  Abdominal:     General: Bowel sounds are normal. There is no distension.     Palpations: Abdomen is soft.  Musculoskeletal:        General: No deformity. Normal range of motion.     Cervical back: Normal range of motion and neck supple.  Skin:    General: Skin is warm and dry.     Findings: No erythema or rash.  Neurological:     Mental Status: He is alert. Mental status is at baseline.     Cranial Nerves: No cranial nerve deficit.     Coordination: Coordination normal.     Comments: Orientated x2  Psychiatric:        Mood and Affect: Mood normal.     CMP Latest Ref Rng & Units 10/06/2019  Glucose 70 - 99 mg/dL 109(H)  BUN 8 - 23 mg/dL 17  Creatinine 0.61 - 1.24 mg/dL 1.45(H)  Sodium 135 - 145 mmol/L 143  Potassium 3.5 - 5.1 mmol/L 4.5  Chloride 98 - 111 mmol/L 107  CO2 22 - 32 mmol/L 28  Calcium 8.9 - 10.3 mg/dL 8.8(L)  Total Protein 6.5 - 8.1 g/dL 6.9  Total Bilirubin 0.3 - 1.2 mg/dL 0.5  Alkaline Phos 38 - 126 U/L 75  AST 15 - 41 U/L 24  ALT 0 - 44 U/L 29   CBC Latest Ref Rng & Units 10/06/2019  WBC 4.0 - 10.5 K/uL 9.7  Hemoglobin 13.0 - 17.0 g/dL 9.8(L)  Hematocrit 39 - 52 % 31.6(L)  Platelets 150 - 400 K/uL 321    RADIOGRAPHIC STUDIES: I have personally reviewed the radiological images as listed and agreed with the findings in the report. CT CHEST W CONTRAST  Result Date: 09/13/2019 CLINICAL DATA:  69 year old male with history of colorectal cancer. Staging  examination. EXAM: CT CHEST, ABDOMEN, AND PELVIS WITH CONTRAST TECHNIQUE: Multidetector CT imaging of the  chest, abdomen and pelvis was performed following the standard protocol during bolus administration of intravenous contrast. CONTRAST:  148mL OMNIPAQUE IOHEXOL 300 MG/ML  SOLN COMPARISON:  No priors. FINDINGS: CT CHEST FINDINGS Cardiovascular: Heart size is normal. There is no significant pericardial fluid, thickening or pericardial calcification. There is aortic atherosclerosis, as well as atherosclerosis of the great vessels of the mediastinum and the coronary arteries, including calcified atherosclerotic plaque in the ramus intermedius coronary artery. Mediastinum/Nodes: No pathologically enlarged mediastinal or hilar lymph nodes. Esophagus is unremarkable in appearance. No axillary lymphadenopathy. Lungs/Pleura: Dependent areas of subsegmental atelectasis are noted in the lower lobes of the lungs bilaterally. No acute consolidative airspace disease. No pleural effusions. A few scattered tiny 2-3 mm pulmonary nodules are noted in the lungs bilaterally, nonspecific. No larger more suspicious appearing pulmonary nodules or masses are noted. Musculoskeletal: There are no aggressive appearing lytic or blastic lesions noted in the visualized portions of the skeleton. CT ABDOMEN PELVIS FINDINGS Hepatobiliary: No suspicious cystic or solid hepatic lesions. No intra or extrahepatic biliary ductal dilatation. Gallbladder is normal in appearance. Pancreas: No pancreatic mass. No pancreatic ductal dilatation. No pancreatic or peripancreatic fluid collections or inflammatory changes. Spleen: Unremarkable. Adrenals/Urinary Tract: Tiny subcentimeter low-attenuation lesions in the right kidney, too small to definitively characterize, but statistically likely to represent tiny cysts. Left kidney and bilateral adrenal glands are normal in appearance. No hydroureteronephrosis. Urinary bladder is normal in appearance.  Stomach/Bowel: Normal appearance of the stomach. No pathologic dilatation of small bowel or colon. Masslike irregular mural thickening of the rectum which is difficult to discretely measure given its irregular appearance, but is best demonstrated on axial image 116 of series 504, presumably a primary rectal neoplasm. Notably, this obliterates the fat plane in the mesorectal fat between the lesion and the right seminal vesicle (best appreciated on axial image 116 of series 504). The appendix is not confidently identified and may be surgically absent. Regardless, there are no inflammatory changes noted adjacent to the cecum to suggest the presence of an acute appendicitis at this time. Vascular/Lymphatic: Aortic atherosclerosis, without evidence of aneurysm or dissection in the abdominal or pelvic vasculature. Several prominent but nonenlarged mesorectal lymph nodes are noted measuring up to 7 mm in short axis. No other lymphadenopathy noted in the abdomen or pelvis. Reproductive: There is some obscuration of the fat plane between the right seminal vesicle and the rectal mass (discussed above). Prostate gland and seminal vesicles are otherwise unremarkable in appearance. Other: No significant volume of ascites.  No pneumoperitoneum. Musculoskeletal: There are no aggressive appearing lytic or blastic lesions noted in the visualized portions of the skeleton. IMPRESSION: 1. Rectal mass with evidence of local invasion into the mesorectal fat and potential involvement of the right seminal vesicle. Multiple borderline enlarged mesorectal lymph nodes. No other definite evidence of distal metastatic disease elsewhere in the abdomen or pelvis. 2. Small pulmonary nodules measuring 2-3 mm in the lungs bilaterally, highly nonspecific and statistically likely benign. However, close attention on follow-up studies is recommended to ensure their stability as metastatic disease is not entirely excluded. 3. Aortic atherosclerosis, in  addition to single-vessel coronary artery disease. Assessment for potential risk factor modification, dietary therapy or pharmacologic therapy may be warranted, if clinically indicated. 4. Additional incidental findings, as above. Electronically Signed   By: Vinnie Langton M.D.   On: 09/13/2019 19:56   MR BRAIN W WO CONTRAST  Result Date: 09/15/2019 CLINICAL DATA:  Provided history: Metastatic disease evaluation. Additional history provided: Recent diagnosis  of rectal cancer, evaluate for metastatic disease. EXAM: MRI HEAD WITHOUT AND WITH CONTRAST TECHNIQUE: Multiplanar, multiecho pulse sequences of the brain and surrounding structures were obtained without and with intravenous contrast. CONTRAST:  25mL GADAVIST GADOBUTROL 1 MMOL/ML IV SOLN COMPARISON:  Head CT 10/02/2018, MRI/MRA head 03/21/2018 and 03/22/2018 FINDINGS: Brain: Stable, moderate generalized parenchymal atrophy. There is a punctate focus of restricted diffusion within the left parietal white matter without appreciable corresponding enhancement (series 5, image 29) (series 7, image 9). Redemonstrated chronic hemorrhagic infarct within the left basal ganglia. Chronic lacunar infarcts within thalami have progressed as compared to the MRI of 03/21/2018. Redemonstrated chronic hemorrhagic infarct within the posterior left midbrain. Chronic infarcts within the pons have progressed. As before, there is background advanced chronic small vessel ischemic disease within the cerebral white matter. Wallerian degeneration of the left cerebral peduncle. No evidence of acute infarct elsewhere within the brain. There are few scattered supratentorial chronic microhemorrhages. No abnormal intracranial enhancement. No extra-axial fluid collection. No midline shift. Vascular: Expected proximal arterial flow voids. Skull and upper cervical spine: No focal suspicious marrow lesion. There is precontrast T1 hyperintensity within the ventral C3 vertebral body, likely  benign. Sinuses/Orbits: Visualized orbits show no acute finding. Mild ethmoid sinus mucosal thickening. No significant mastoid effusion. IMPRESSION: There is a punctate focus of restricted diffusion within the left parietal white matter without appreciable corresponding enhancement. This most likely reflects an acute or early subacute small vessel infarct, although attention is recommended on follow-up. No evidence of intracranial metastatic disease elsewhere. Multiple chronic infarcts as described, some of which are new as compared to the prior MRI of 03/21/2018. Stable background moderate generalized parenchymal atrophy and advanced chronic small vessel ischemic disease. Mild ethmoid sinus mucosal thickening. Electronically Signed   By: Kellie Simmering DO   On: 09/15/2019 15:07   MR PELVIS WO CONTRAST  Result Date: 09/14/2019 CLINICAL DATA:  Rectal mass with potential local invasion based on prior imaging EXAM: MRI PELVIS WITHOUT CONTRAST TECHNIQUE: Multiplanar multisequence MR imaging of the pelvis was performed. No intravenous contrast was administered. Small amount of [place] gel was administered per rectum to optimize tumor evaluation. COMPARISON:  09/13/2019 FINDINGS: TUMOR LOCATION Tumor distance from Anal Verge/Skin Surface:  13.3 cm Tumor distance to Internal Anal Sphincter: 5.9 cm TUMOR DESCRIPTION Circumferential Extent: Circumferential tumor in the upper margin of the tumor. The tumor favors the RIGHT anterolateral rectum and is eccentric towards the inferior margin. Tumor Length: 8.2 cm T - CATEGORY Extension through Muscularis Propria: Yes, approximately 1.5-1.6 cm beyond the confines of the rectum in the axial plane. At least 6-7 mm beyond in the sagittal. Note that bulging of the anterior rectum with a maintained defined T2 hypointense rim is noted along the RIGHT anterolateral margin. Shortest Distance of any tumor/node from Mesorectal Fascia: 0 mm Extramural Vascular Invasion/Tumor Thrombus: Yes  expanded low signal vein arising from an area of extra rectal extension along the LEFT lateral rectal margin (image 20, series 8) also on image 23 of series 9. Expanded vein with low signal also demonstrated on image 31 of series 8 Invasion of Anterior Peritoneal Reflection: Yes Involvement of Adjacent Organs or Pelvic Sidewall: Yes, invasion of the seminal vesicles best seen on image 21 of series 8, tumor passes through the anterior peritoneal reflection at this level extending to the seminal vesicles greatest on the RIGHT just to the RIGHT of midline. Levator Ani Involvement: No N - CATEGORY Mesorectal Lymph Nodes >=73mm: N2 with multiple lymph nodes in  the mesorectum extending high along the superior rectal vein and into the nodal chains along the inferior IMV as seen on the prior CT. Greater than 4 lymph nodes meet this criteria Extra-mesorectal Lymphadenopathy: Small lymph nodes along venous drainage pathways and lymphatic pathways above the mesorectum, no pelvic sidewall adenopathy. Other: Urinary Tract: Urinary bladder is grossly normal. No signs of distal ureteral dilation. Bowel: As above Vascular/Lymphatic: Lymphatic assessment as described. Reproductive: Seminal vesicle involvement as discussed. Other: None Musculoskeletal: Limited assessment of musculoskeletal structures is unremarkable. 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 Rectal adenocarcinoma N stage: N2 with superior rectal and nodes about the sacral promontory and just above, bordering on nodes outside of regional nodal stations. Distance from tumor to the internal anal sphincter is 13.3 cm. Electronically Signed   By: Zetta Bills M.D.   On: 09/14/2019 08:18   CT ABDOMEN PELVIS W CONTRAST  Result Date: 09/13/2019 CLINICAL DATA:  69 year old male with history of colorectal cancer. Staging examination. EXAM: CT CHEST, ABDOMEN, AND PELVIS WITH CONTRAST TECHNIQUE: Multidetector CT imaging of the chest,  abdomen and pelvis was performed following the standard protocol during bolus administration of intravenous contrast. CONTRAST:  156mL OMNIPAQUE IOHEXOL 300 MG/ML  SOLN COMPARISON:  No priors. FINDINGS: CT CHEST FINDINGS Cardiovascular: Heart size is normal. There is no significant pericardial fluid, thickening or pericardial calcification. There is aortic atherosclerosis, as well as atherosclerosis of the great vessels of the mediastinum and the coronary arteries, including calcified atherosclerotic plaque in the ramus intermedius coronary artery. Mediastinum/Nodes: No pathologically enlarged mediastinal or hilar lymph nodes. Esophagus is unremarkable in appearance. No axillary lymphadenopathy. Lungs/Pleura: Dependent areas of subsegmental atelectasis are noted in the lower lobes of the lungs bilaterally. No acute consolidative airspace disease. No pleural effusions. A few scattered tiny 2-3 mm pulmonary nodules are noted in the lungs bilaterally, nonspecific. No larger more suspicious appearing pulmonary nodules or masses are noted. Musculoskeletal: There are no aggressive appearing lytic or blastic lesions noted in the visualized portions of the skeleton. CT ABDOMEN PELVIS FINDINGS Hepatobiliary: No suspicious cystic or solid hepatic lesions. No intra or extrahepatic biliary ductal dilatation. Gallbladder is normal in appearance. Pancreas: No pancreatic mass. No pancreatic ductal dilatation. No pancreatic or peripancreatic fluid collections or inflammatory changes. Spleen: Unremarkable. Adrenals/Urinary Tract: Tiny subcentimeter low-attenuation lesions in the right kidney, too small to definitively characterize, but statistically likely to represent tiny cysts. Left kidney and bilateral adrenal glands are normal in appearance. No hydroureteronephrosis. Urinary bladder is normal in appearance. Stomach/Bowel: Normal appearance of the stomach. No pathologic dilatation of small bowel or colon. Masslike irregular  mural thickening of the rectum which is difficult to discretely measure given its irregular appearance, but is best demonstrated on axial image 116 of series 504, presumably a primary rectal neoplasm. Notably, this obliterates the fat plane in the mesorectal fat between the lesion and the right seminal vesicle (best appreciated on axial image 116 of series 504). The appendix is not confidently identified and may be surgically absent. Regardless, there are no inflammatory changes noted adjacent to the cecum to suggest the presence of an acute appendicitis at this time. Vascular/Lymphatic: Aortic atherosclerosis, without evidence of aneurysm or dissection in the abdominal or pelvic vasculature. Several prominent but nonenlarged mesorectal lymph nodes are noted measuring up to 7 mm in short axis. No other lymphadenopathy noted in the abdomen or pelvis. Reproductive: There is some obscuration of the fat plane between the right seminal vesicle and the rectal mass (  discussed above). Prostate gland and seminal vesicles are otherwise unremarkable in appearance. Other: No significant volume of ascites.  No pneumoperitoneum. Musculoskeletal: There are no aggressive appearing lytic or blastic lesions noted in the visualized portions of the skeleton. IMPRESSION: 1. Rectal mass with evidence of local invasion into the mesorectal fat and potential involvement of the right seminal vesicle. Multiple borderline enlarged mesorectal lymph nodes. No other definite evidence of distal metastatic disease elsewhere in the abdomen or pelvis. 2. Small pulmonary nodules measuring 2-3 mm in the lungs bilaterally, highly nonspecific and statistically likely benign. However, close attention on follow-up studies is recommended to ensure their stability as metastatic disease is not entirely excluded. 3. Aortic atherosclerosis, in addition to single-vessel coronary artery disease. Assessment for potential risk factor modification, dietary therapy  or pharmacologic therapy may be warranted, if clinically indicated. 4. Additional incidental findings, as above. Electronically Signed   By: Vinnie Langton M.D.   On: 09/13/2019 19:56   US Venous Img Upper Uni Right  Result Date: 09/26/2019 CLINICAL DATA:  Right upper extremity edema. EXAM: RIGHT UPPER EXTREMITY VENOUS DOPPLER ULTRASOUND TECHNIQUE: Gray-scale sonography with graded compression, as well as color Doppler and duplex ultrasound were performed to evaluate the upper extremity deep venous system from the level of the subclavian vein and including the jugular, axillary, basilic, radial, ulnar and upper cephalic vein. Spectral Doppler was utilized to evaluate flow at rest and with distal augmentation maneuvers. COMPARISON:  None. FINDINGS: Contralateral Subclavian Vein: Respiratory phasicity is normal and symmetric with the symptomatic side. No evidence of thrombus. Normal compressibility. Internal Jugular Vein: No evidence of thrombus. Normal compressibility, respiratory phasicity and response to augmentation. Subclavian Vein: No evidence of thrombus. Normal compressibility, respiratory phasicity and response to augmentation. Axillary Vein: No evidence of thrombus. Normal compressibility, respiratory phasicity and response to augmentation. Cephalic Vein: Thrombus is identified in the right cephalic vein beginning just above the wrist and continuing to just above the antecubital fossa. Basilic Vein: No evidence of thrombus. Normal compressibility, respiratory phasicity and response to augmentation. Brachial Veins: No evidence of thrombus. Normal compressibility, respiratory phasicity and response to augmentation. Radial Veins: No evidence of thrombus. Normal compressibility, respiratory phasicity and response to augmentation. Ulnar Veins: No evidence of thrombus. Normal compressibility, respiratory phasicity and response to augmentation. Venous Reflux:  None visualized. Other Findings:  No abnormal  fluid collections identified. IMPRESSION: 1. No evidence deep vein thrombosis in the right upper extremity. 2. Positive for superficial thrombophlebitis of the cephalic vein from just above the wrist to just proximal to the antecubital fossa. Electronically Signed   By: Aletta Edouard M.D.   On: 09/26/2019 16:00   DG CHEST PORT 1 VIEW  Result Date: 09/15/2019 CLINICAL DATA:  69 year old male status post Port-A-Cath placement EXAM: PORTABLE CHEST 1 VIEW COMPARISON:  Chest radiograph dated 03/21/2018 and CT dated 09/13/2019 FINDINGS: Right-sided Port-A-Cath with tip at the cavoatrial junction. There is mild cardiomegaly with mild vascular congestion. No focal consolidation or pneumothorax. Trace right pleural effusion may be present. Atherosclerotic calcification of the aorta. No acute osseous pathology. IMPRESSION: Port-A-Cath with tip at the cavoatrial junction. Electronically Signed   By: Anner Crete M.D.   On: 09/15/2019 17:40   DG C-Arm 1-60 Min-No Report  Result Date: 09/15/2019 Fluoroscopy was utilized by the requesting physician.  No radiographic interpretation.     Assessment and plan 1. Rectal cancer ([place])   2. B12 deficiency   3. Iron deficiency anemia due to chronic blood loss   4.  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  He tolerates well.  #Iron deficiency anemia, Hemoglobin has further improved.  Proceed with IV Venofer 200 mg x 1.  #H. pylori gastritis, patient to finish course of antibiotics and PPI #Vitamin B12 deficiency, B12 level has improved.  Continue B12 supplementation.  #Right upper extremity superficial thrombophlebitis.  His symptoms has improved.    #Cognitive  impairment, likely due to vascular dementia.  Vitamin B12 deficiency may contribute to that and is being treated for B12 deficiency. #Patient will establish care with palliative care service. We spent sufficient time to discuss many aspect of care, questions were answered to patient's satisfaction. Patient will follow up in 1 week evaluation prior to next cycle of treatment. [name], MD, PhD Hematology Oncology [facility] Pager- [phone] 10/06/2019

## 2019-10-06 NOTE — Progress Notes (Signed)
Patient denies new problems/concerns today.   °

## 2019-10-11 ENCOUNTER — Other Ambulatory Visit: Payer: Self-pay

## 2019-10-11 ENCOUNTER — Telehealth: Payer: Self-pay | Admitting: Family Medicine

## 2019-10-11 ENCOUNTER — Inpatient Hospital Stay (HOSPITAL_BASED_OUTPATIENT_CLINIC_OR_DEPARTMENT_OTHER): Payer: Medicare Other | Admitting: Hospice and Palliative Medicine

## 2019-10-11 ENCOUNTER — Inpatient Hospital Stay (HOSPITAL_BASED_OUTPATIENT_CLINIC_OR_DEPARTMENT_OTHER): Payer: Medicare Other | Admitting: Oncology

## 2019-10-11 ENCOUNTER — Encounter: Payer: Self-pay | Admitting: Oncology

## 2019-10-11 ENCOUNTER — Inpatient Hospital Stay: Payer: Medicare Other

## 2019-10-11 VITALS — BP 167/75 | HR 54 | Temp 96.7°F | Resp 16 | Wt 225.7 lb

## 2019-10-11 DIAGNOSIS — E538 Deficiency of other specified B group vitamins: Secondary | ICD-10-CM

## 2019-10-11 DIAGNOSIS — Z5111 Encounter for antineoplastic chemotherapy: Secondary | ICD-10-CM

## 2019-10-11 DIAGNOSIS — D500 Iron deficiency anemia secondary to blood loss (chronic): Secondary | ICD-10-CM

## 2019-10-11 DIAGNOSIS — I808 Phlebitis and thrombophlebitis of other sites: Secondary | ICD-10-CM | POA: Diagnosis not present

## 2019-10-11 DIAGNOSIS — Z515 Encounter for palliative care: Secondary | ICD-10-CM

## 2019-10-11 DIAGNOSIS — C20 Malignant neoplasm of rectum: Secondary | ICD-10-CM

## 2019-10-11 LAB — CBC WITH DIFFERENTIAL/PLATELET
Abs Immature Granulocytes: 0.01 10*3/uL (ref 0.00–0.07)
Basophils Absolute: 0.1 10*3/uL (ref 0.0–0.1)
Basophils Relative: 1 %
Eosinophils Absolute: 0.2 10*3/uL (ref 0.0–0.5)
Eosinophils Relative: 2 %
HCT: 31.7 % — ABNORMAL LOW (ref 39.0–52.0)
Hemoglobin: 9.9 g/dL — ABNORMAL LOW (ref 13.0–17.0)
Immature Granulocytes: 0 %
Lymphocytes Relative: 23 %
Lymphs Abs: 1.7 10*3/uL (ref 0.7–4.0)
MCH: 25.3 pg — ABNORMAL LOW (ref 26.0–34.0)
MCHC: 31.2 g/dL (ref 30.0–36.0)
MCV: 80.9 fL (ref 80.0–100.0)
Monocytes Absolute: 0.6 10*3/uL (ref 0.1–1.0)
Monocytes Relative: 8 %
Neutro Abs: 4.9 10*3/uL (ref 1.7–7.7)
Neutrophils Relative %: 66 %
Platelets: 245 10*3/uL (ref 150–400)
RBC: 3.92 MIL/uL — ABNORMAL LOW (ref 4.22–5.81)
RDW: 25.2 % — ABNORMAL HIGH (ref 11.5–15.5)
WBC: 7.5 10*3/uL (ref 4.0–10.5)
nRBC: 0 % (ref 0.0–0.2)

## 2019-10-11 LAB — COMPREHENSIVE METABOLIC PANEL
ALT: 19 U/L (ref 0–44)
AST: 17 U/L (ref 15–41)
Albumin: 3.3 g/dL — ABNORMAL LOW (ref 3.5–5.0)
Alkaline Phosphatase: 67 U/L (ref 38–126)
Anion gap: 7 (ref 5–15)
BUN: 16 mg/dL (ref 8–23)
CO2: 26 mmol/L (ref 22–32)
Calcium: 8.7 mg/dL — ABNORMAL LOW (ref 8.9–10.3)
Chloride: 109 mmol/L (ref 98–111)
Creatinine, Ser: 1.49 mg/dL — ABNORMAL HIGH (ref 0.61–1.24)
GFR calc Af Amer: 54 mL/min — ABNORMAL LOW (ref >60)
GFR calc non Af Amer: 47 mL/min — ABNORMAL LOW (ref >60)
Glucose, Bld: 97 mg/dL (ref 70–99)
Potassium: 4 mmol/L (ref 3.5–5.1)
Sodium: 142 mmol/L (ref 135–145)
Total Bilirubin: 0.7 mg/dL (ref 0.3–1.2)
Total Protein: 6.7 g/dL (ref 6.5–8.1)

## 2019-10-11 MED ORDER — LEUCOVORIN CALCIUM INJECTION 350 MG
900.0000 mg | Freq: Once | INTRAVENOUS | Status: AC
  Administered 2019-10-11: 900 mg via INTRAVENOUS
  Filled 2019-10-11: qty 25

## 2019-10-11 MED ORDER — DEXTROSE 5 % IV SOLN
Freq: Once | INTRAVENOUS | Status: AC
  Filled 2019-10-11: qty 250

## 2019-10-11 MED ORDER — HEPARIN SOD (PORK) LOCK FLUSH 100 UNIT/ML IV SOLN
500.0000 [IU] | Freq: Once | INTRAVENOUS | Status: DC
  Filled 2019-10-11: qty 5

## 2019-10-11 MED ORDER — OXALIPLATIN CHEMO INJECTION 100 MG/20ML
80.0000 mg/m2 | Freq: Once | INTRAVENOUS | Status: AC
  Administered 2019-10-11: 180 mg via INTRAVENOUS
  Filled 2019-10-11: qty 36

## 2019-10-11 MED ORDER — PALONOSETRON HCL INJECTION 0.25 MG/5ML
0.2500 mg | Freq: Once | INTRAVENOUS | Status: AC
  Administered 2019-10-11: 0.25 mg via INTRAVENOUS
  Filled 2019-10-11: qty 5

## 2019-10-11 MED ORDER — SODIUM CHLORIDE 0.9% FLUSH
10.0000 mL | Freq: Once | INTRAVENOUS | Status: AC
  Administered 2019-10-11: 10 mL via INTRAVENOUS
  Filled 2019-10-11: qty 10

## 2019-10-11 MED ORDER — SODIUM CHLORIDE 0.9% FLUSH
10.0000 mL | INTRAVENOUS | Status: DC | PRN
  Administered 2019-10-11: 10 mL
  Filled 2019-10-11: qty 10

## 2019-10-11 MED ORDER — SODIUM CHLORIDE 0.9 % IV SOLN
10.0000 mg | Freq: Once | INTRAVENOUS | Status: AC
  Administered 2019-10-11: 10 mg via INTRAVENOUS
  Filled 2019-10-11: qty 10

## 2019-10-11 MED ORDER — SODIUM CHLORIDE 0.9 % IV SOLN
2400.0000 mg/m2 | INTRAVENOUS | Status: DC
  Administered 2019-10-11: 5400 mg via INTRAVENOUS
  Filled 2019-10-11: qty 108

## 2019-10-11 NOTE — Progress Notes (Signed)
Hematology/Oncology Follow Up Note [facility]  Telephone:([phone] [phone]  Patient Care Team: [name], MD as PCP - General (Family Medicine) Borders, Kirt Boys, NP as Nurse Practitioner (Hospice and Palliative Medicine) [name], MD as Consulting Physician (Oncology) [name], MD as Referring Physician (Radiation Oncology) [name], NP as Nurse Practitioner   Name of the patient: Christopher Delacruz  [id number]  Apr 17, 1950   REASON FOR VISIT  follow-up for rectal cancer  PERTINENT ONCOLOGY HISTORY Christopher Delacruz is a 69 y.o.[place] who has above oncology history reviewed by me today presented for follow up visit for management of locally advanced rectal cancer. -Initially presented to emergency room due to profound anemia. 09/11/2019 EGD showed duodenal erosions without bleeding 09/11/2019 colonoscopy preparation was poor.  Likely malignant partially obstructing tumor in the rectosigmoid colon area which was biopsied. 09/13/2019, repeat colonoscopy showed 12 mm polyps were found in the descending colon.  Polyp was sessile.  Polyp was resected and retrieved.  A polypoid ulcerated partially obstructing large mass was found from 10 to 20 cm proximal to the anus.  The mass was circumferential, measured 10 cm in length.  In addition the diameter measures 12 mm.  Oozing was present 09/11/2019, biopsy showed invasive colorectal adenocarcinoma.  Moderately differentiated. 09/13/2019, CT chest abdomen pelvis showed rectal wall mass with evidence of local invasion to the mesorectal fat and potential involvement of the right seminal vesicle.  Multiple enlarged lymph nodes.  Small lung nodules 2 to 3 mm in the lung bilaterally.  Highly nonspecific.  Cannot rule out metastasis.  09/14/2019 MRI pelvis showed T4b N2 rectal cancer. 09/15/2019 MRI brain with and without contrast showed punctate focus of restricted diffusion most likely reflects acute or  early subacute small vessel infarct.  No evidence of intracranial metastasis.  Multiple chronic infarct.  #Iron deficiency anemia, patient received PRBC transfusion and IV Venofer treatments. #Vitamin B12 deficiency, status post parenteral vitamin B12 injections during hospitalization.  Currently on vitamin B12 supplementation. #Patient was found to have very poor insight to his condition.  Was seen by psychiatrist and was deemed incompetent.  Patient's medical power of attorney is his nephew [name].  #Mediport was placed by Dr. Linden Dolin L HISTORY 69 y.o. patient presents for follow-up of rectal cancer.Marland Kitchen  He was accompanied by his nephew [name].  [name] is also his power of attorney. 09/26/2019. Status post cycle 1 dose reduced FOLFOX  He tolerates well.  He denies any blood in his bowel movement or any difficulty in passing bowel movement. [name] reports that patient's niece saw some blood on his pant a few days ago.    Review of Systems  Unable to perform ROS: Other (Cognitive impairment)  Constitutional: Positive for fatigue.  Respiratory: Negative for shortness of breath.   Gastrointestinal: Positive for blood in stool. Negative for abdominal pain and constipation.      No Known Allergies   Past Medical History:  Diagnosis Date  . B12 deficiency 09/17/2019  . CVA (cerebral vascular accident) ([place])   . Diabetes mellitus ([place])   . Hyperlipidemia   . Hypertension      Past Surgical History:  Procedure Laterality Date  . COLONOSCOPY N/A 09/11/2019   Procedure: COLONOSCOPY;  Surgeon: [name], MD;  Location: [facility] ENDOSCOPY;  Service: Endoscopy;  Laterality: N/A;  . COLONOSCOPY WITH PROPOFOL N/A 09/12/2019   Procedure: COLONOSCOPY WITH PROPOFOL;  Surgeon: [name], MD;  Location: [facility] ENDOSCOPY;  Service: Gastroenterology;  Laterality: N/A;  .  COLONOSCOPY WITH PROPOFOL N/A 09/13/2019   Procedure: COLONOSCOPY WITH PROPOFOL;  Surgeon: [name], MD;  Location[facility]  ENDOSCOPY;  Service: Gastroenterology;  Laterality: N/A;  . ESOPHAGOGASTRODUODENOSCOPY N/A 09/11/2019   Procedure: ESOPHAGOGASTRODUODENOSCOPY (EGD);  Surgeon: [name], MD;  Location: [facility] ENDOSCOPY;  Service: Endoscopy;  Laterality: N/A;  . EXPLORE EYE SOCKET    . PORTACATH PLACEMENT N/A 09/15/2019   Procedure: INSERTION PORT-A-CATH;  Surgeon: Jules Husbands, MD;  Location: [facility] ORS;  Service: General;  Laterality: N/A;    Social History   Socioeconomic History  . Marital status: Divorced    Spouse name: Not on file  . Number of children: Not on file  . Years of education: Not on file  . Highest education level: Not on file  Occupational History  . Not on file  Tobacco Use  . Smoking status: Never Smoker  . Smokeless tobacco: Never Used  Vaping Use  . Vaping Use: Never used  Substance and Sexual Activity  . Alcohol use: Never    Alcohol/week: 0.0 standard drinks  . Drug use: Never  . Sexual activity: Not on file  Other Topics Concern  . Not on file  Social History Narrative  . Not on file   Social Determinants of Health   Financial Resource Strain:   . Difficulty of Paying Living Expenses: Not on file  Food Insecurity:   . Worried About Charity fundraiser in the Last Year: Not on file  . Ran Out of Food in the Last Year: Not on file  Transportation Needs:   . Lack of Transportation (Medical): Not on file  . Lack of Transportation (Non-Medical): Not on file  Physical Activity:   . Days of Exercise per Week: Not on file  . Minutes of Exercise per Session: Not on file  Stress:   . Feeling of Stress : Not on file  Social Connections:   . Frequency of Communication with Friends and Family: Not on file  . Frequency of Social Gatherings with Friends and Family: Not on file  . Attends Religious Services: Not on file  . Active Member of Clubs or Organizations: Not on file  . Attends Archivist Meetings: Not on file  . Marital Status: Not on file  Intimate  Partner Violence:   . Fear of Current or Ex-Partner: Not on file  . Emotionally Abused: Not on file  . Physically Abused: Not on file  . Sexually Abused: Not on file    Family History  Problem Relation Age of Onset  . Heart attack Sister   . Leukemia Brother   . Stroke Brother      Current Outpatient Medications:  .  amLODipine (NORVASC) 10 MG tablet, Take 1 tablet (10 mg total) by mouth daily., Disp: 30 tablet, Rfl: 1 .  aspirin EC 81 MG EC tablet, Take 1 tablet (81 mg total) by mouth daily. Swallow whole., Disp: 30 tablet, Rfl: 11 .  clopidogrel (PLAVIX) 75 MG tablet, Take 1 tablet (75 mg total) by mouth daily., Disp: 30 tablet, Rfl: 1 .  ferrous sulfate 325 (65 FE) MG tablet, Take 1 tablet (325 mg total) by mouth daily., Disp: 30 tablet, Rfl: 3 .  finasteride (PROSCAR) 5 MG tablet, Take 1 tablet (5 mg total) by mouth daily., Disp: 30 tablet, Rfl: 0 .  lidocaine-prilocaine (EMLA) cream, Apply to affected area once (Patient not taking: Reported on 09/27/2019), Disp: 30 g, Rfl: 3 .  Multiple Vitamin (MULTIVITAMIN WITH MINERALS) TABS tablet,  Take 1 tablet by mouth daily., Disp: 30 tablet, Rfl: 0 .  ondansetron (ZOFRAN) 8 MG tablet, Take 1 tablet (8 mg total) by mouth 2 (two) times daily as needed for refractory nausea / vomiting. Start on day 3 after chemotherapy., Disp: 30 tablet, Rfl: 1 .  pantoprazole (PROTONIX) 40 MG tablet, Take 1 tablet (40 mg total) by mouth 2 (two) times daily before a meal for 10 days., Disp: 20 tablet, Rfl: 0 .  prochlorperazine (COMPAZINE) 10 MG tablet, Take 1 tablet (10 mg total) by mouth every 6 (six) hours as needed (Nausea or vomiting)., Disp: 30 tablet, Rfl: 1 .  tamsulosin (FLOMAX) 0.4 MG CAPS capsule, Take 1 capsule (0.4 mg total) by mouth daily., Disp: 30 capsule, Rfl: 11 .  vitamin B-12 1000 MCG tablet, Take 1 tablet (1,000 mcg total) by mouth daily., Disp: 30 tablet, Rfl: 0  Physical exam:  Vitals:   10/11/19 0940  BP: (!) 167/75  Pulse: (!) 54    Resp: 16  Temp: (!) 96.7 F (35.9 C)  Weight: 225 lb 11.2 oz (102.4 kg)   Physical Exam Constitutional:      General: He is not in acute distress.    Comments: Patient sits in the wheelchair.  HENT:     Head: Normocephalic and atraumatic.  Eyes:     General: No scleral icterus. Cardiovascular:     Rate and Rhythm: Normal rate and regular rhythm.     Heart sounds: Normal heart sounds.  Pulmonary:     Effort: Pulmonary effort is normal. No respiratory distress.     Breath sounds: No wheezing.  Abdominal:     General: Bowel sounds are normal. There is no distension.     Palpations: Abdomen is soft.  Musculoskeletal:        General: No deformity. Normal range of motion.     Cervical back: Normal range of motion and neck supple.  Skin:    General: Skin is warm and dry.     Findings: No erythema or rash.  Neurological:     Mental Status: He is alert. Mental status is at baseline.     Cranial Nerves: No cranial nerve deficit.     Coordination: Coordination normal.     Comments: Orientated x2  Psychiatric:        Mood and Affect: Mood normal.     CMP Latest Ref Rng & Units 10/06/2019  Glucose 70 - 99 mg/dL 109(H)  BUN 8 - 23 mg/dL 17  Creatinine 0.61 - 1.24 mg/dL 1.45(H)  Sodium 135 - 145 mmol/L 143  Potassium 3.5 - 5.1 mmol/L 4.5  Chloride 98 - 111 mmol/L 107  CO2 22 - 32 mmol/L 28  Calcium 8.9 - 10.3 mg/dL 8.8(L)  Total Protein 6.5 - 8.1 g/dL 6.9  Total Bilirubin 0.3 - 1.2 mg/dL 0.5  Alkaline Phos 38 - 126 U/L 75  AST 15 - 41 U/L 24  ALT 0 - 44 U/L 29   CBC Latest Ref Rng & Units 10/06/2019  WBC 4.0 - 10.5 K/uL 9.7  Hemoglobin 13.0 - 17.0 g/dL 9.8(L)  Hematocrit 39 - 52 % 31.6(L)  Platelets 150 - 400 K/uL 321    RADIOGRAPHIC STUDIES: I have personally reviewed the radiological images as listed and agreed with the findings in the report. CT CHEST W CONTRAST  Result Date: 09/13/2019 CLINICAL DATA:  69 year old male with history of colorectal cancer. Staging  examination. EXAM: CT CHEST, ABDOMEN, AND PELVIS WITH CONTRAST TECHNIQUE: Multidetector CT imaging  of the chest, abdomen and pelvis was performed following the standard protocol during bolus administration of intravenous contrast. CONTRAST:  156mL OMNIPAQUE IOHEXOL 300 MG/ML  SOLN COMPARISON:  No priors. FINDINGS: CT CHEST FINDINGS Cardiovascular: Heart size is normal. There is no significant pericardial fluid, thickening or pericardial calcification. There is aortic atherosclerosis, as well as atherosclerosis of the great vessels of the mediastinum and the coronary arteries, including calcified atherosclerotic plaque in the ramus intermedius coronary artery. Mediastinum/Nodes: No pathologically enlarged mediastinal or hilar lymph nodes. Esophagus is unremarkable in appearance. No axillary lymphadenopathy. Lungs/Pleura: Dependent areas of subsegmental atelectasis are noted in the lower lobes of the lungs bilaterally. No acute consolidative airspace disease. No pleural effusions. A few scattered tiny 2-3 mm pulmonary nodules are noted in the lungs bilaterally, nonspecific. No larger more suspicious appearing pulmonary nodules or masses are noted. Musculoskeletal: There are no aggressive appearing lytic or blastic lesions noted in the visualized portions of the skeleton. CT ABDOMEN PELVIS FINDINGS Hepatobiliary: No suspicious cystic or solid hepatic lesions. No intra or extrahepatic biliary ductal dilatation. Gallbladder is normal in appearance. Pancreas: No pancreatic mass. No pancreatic ductal dilatation. No pancreatic or peripancreatic fluid collections or inflammatory changes. Spleen: Unremarkable. Adrenals/Urinary Tract: Tiny subcentimeter low-attenuation lesions in the right kidney, too small to definitively characterize, but statistically likely to represent tiny cysts. Left kidney and bilateral adrenal glands are normal in appearance. No hydroureteronephrosis. Urinary bladder is normal in appearance.  Stomach/Bowel: Normal appearance of the stomach. No pathologic dilatation of small bowel or colon. Masslike irregular mural thickening of the rectum which is difficult to discretely measure given its irregular appearance, but is best demonstrated on axial image 116 of series 504, presumably a primary rectal neoplasm. Notably, this obliterates the fat plane in the mesorectal fat between the lesion and the right seminal vesicle (best appreciated on axial image 116 of series 504). The appendix is not confidently identified and may be surgically absent. Regardless, there are no inflammatory changes noted adjacent to the cecum to suggest the presence of an acute appendicitis at this time. Vascular/Lymphatic: Aortic atherosclerosis, without evidence of aneurysm or dissection in the abdominal or pelvic vasculature. Several prominent but nonenlarged mesorectal lymph nodes are noted measuring up to 7 mm in short axis. No other lymphadenopathy noted in the abdomen or pelvis. Reproductive: There is some obscuration of the fat plane between the right seminal vesicle and the rectal mass (discussed above). Prostate gland and seminal vesicles are otherwise unremarkable in appearance. Other: No significant volume of ascites.  No pneumoperitoneum. Musculoskeletal: There are no aggressive appearing lytic or blastic lesions noted in the visualized portions of the skeleton. IMPRESSION: 1. Rectal mass with evidence of local invasion into the mesorectal fat and potential involvement of the right seminal vesicle. Multiple borderline enlarged mesorectal lymph nodes. No other definite evidence of distal metastatic disease elsewhere in the abdomen or pelvis. 2. Small pulmonary nodules measuring 2-3 mm in the lungs bilaterally, highly nonspecific and statistically likely benign. However, close attention on follow-up studies is recommended to ensure their stability as metastatic disease is not entirely excluded. 3. Aortic atherosclerosis, in  addition to single-vessel coronary artery disease. Assessment for potential risk factor modification, dietary therapy or pharmacologic therapy may be warranted, if clinically indicated. 4. Additional incidental findings, as above. Electronically Signed   By: Vinnie Langton M.D.   On: 09/13/2019 19:56   MR BRAIN W WO CONTRAST  Result Date: 09/15/2019 CLINICAL DATA:  Provided history: Metastatic disease evaluation. Additional history provided:  Recent diagnosis of rectal cancer, evaluate for metastatic disease. EXAM: MRI HEAD WITHOUT AND WITH CONTRAST TECHNIQUE: Multiplanar, multiecho pulse sequences of the brain and surrounding structures were obtained without and with intravenous contrast. CONTRAST:  40mL GADAVIST GADOBUTROL 1 MMOL/ML IV SOLN COMPARISON:  Head CT 10/02/2018, MRI/MRA head 03/21/2018 and 03/22/2018 FINDINGS: Brain: Stable, moderate generalized parenchymal atrophy. There is a punctate focus of restricted diffusion within the left parietal white matter without appreciable corresponding enhancement (series 5, image 29) (series 7, image 9). Redemonstrated chronic hemorrhagic infarct within the left basal ganglia. Chronic lacunar infarcts within thalami have progressed as compared to the MRI of 03/21/2018. Redemonstrated chronic hemorrhagic infarct within the posterior left midbrain. Chronic infarcts within the pons have progressed. As before, there is background advanced chronic small vessel ischemic disease within the cerebral white matter. Wallerian degeneration of the left cerebral peduncle. No evidence of acute infarct elsewhere within the brain. There are few scattered supratentorial chronic microhemorrhages. No abnormal intracranial enhancement. No extra-axial fluid collection. No midline shift. Vascular: Expected proximal arterial flow voids. Skull and upper cervical spine: No focal suspicious marrow lesion. There is precontrast T1 hyperintensity within the ventral C3 vertebral body, likely  benign. Sinuses/Orbits: Visualized orbits show no acute finding. Mild ethmoid sinus mucosal thickening. No significant mastoid effusion. IMPRESSION: There is a punctate focus of restricted diffusion within the left parietal white matter without appreciable corresponding enhancement. This most likely reflects an acute or early subacute small vessel infarct, although attention is recommended on follow-up. No evidence of intracranial metastatic disease elsewhere. Multiple chronic infarcts as described, some of which are new as compared to the prior MRI of 03/21/2018. Stable background moderate generalized parenchymal atrophy and advanced chronic small vessel ischemic disease. Mild ethmoid sinus mucosal thickening. Electronically Signed   By: Kellie Simmering DO   On: 09/15/2019 15:07   MR PELVIS WO CONTRAST  Result Date: 09/14/2019 CLINICAL DATA:  Rectal mass with potential local invasion based on prior imaging EXAM: MRI PELVIS WITHOUT CONTRAST TECHNIQUE: Multiplanar multisequence MR imaging of the pelvis was performed. No intravenous contrast was administered. Small amount of [place] gel was administered per rectum to optimize tumor evaluation. COMPARISON:  09/13/2019 FINDINGS: TUMOR LOCATION Tumor distance from Anal Verge/Skin Surface:  13.3 cm Tumor distance to Internal Anal Sphincter: 5.9 cm TUMOR DESCRIPTION Circumferential Extent: Circumferential tumor in the upper margin of the tumor. The tumor favors the RIGHT anterolateral rectum and is eccentric towards the inferior margin. Tumor Length: 8.2 cm T - CATEGORY Extension through Muscularis Propria: Yes, approximately 1.5-1.6 cm beyond the confines of the rectum in the axial plane. At least 6-7 mm beyond in the sagittal. Note that bulging of the anterior rectum with a maintained defined T2 hypointense rim is noted along the RIGHT anterolateral margin. Shortest Distance of any tumor/node from Mesorectal Fascia: 0 mm Extramural Vascular Invasion/Tumor Thrombus: Yes  expanded low signal vein arising from an area of extra rectal extension along the LEFT lateral rectal margin (image 20, series 8) also on image 23 of series 9. Expanded vein with low signal also demonstrated on image 31 of series 8 Invasion of Anterior Peritoneal Reflection: Yes Involvement of Adjacent Organs or Pelvic Sidewall: Yes, invasion of the seminal vesicles best seen on image 21 of series 8, tumor passes through the anterior peritoneal reflection at this level extending to the seminal vesicles greatest on the RIGHT just to the RIGHT of midline. Levator Ani Involvement: No N - CATEGORY Mesorectal Lymph Nodes >=44mm: N2 with multiple lymph  nodes in the mesorectum extending high along the superior rectal vein and into the nodal chains along the inferior IMV as seen on the prior CT. Greater than 4 lymph nodes meet this criteria Extra-mesorectal Lymphadenopathy: Small lymph nodes along venous drainage pathways and lymphatic pathways above the mesorectum, no pelvic sidewall adenopathy. Other: Urinary Tract: Urinary bladder is grossly normal. No signs of distal ureteral dilation. Bowel: As above Vascular/Lymphatic: Lymphatic assessment as described. Reproductive: Seminal vesicle involvement as discussed. Other: None Musculoskeletal: Limited assessment of musculoskeletal structures is unremarkable. 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 Rectal adenocarcinoma N stage: N2 with superior rectal and nodes about the sacral promontory and just above, bordering on nodes outside of regional nodal stations. Distance from tumor to the internal anal sphincter is 13.3 cm. Electronically Signed   By: Zetta Bills M.D.   On: 09/14/2019 08:18   CT ABDOMEN PELVIS W CONTRAST  Result Date: 09/13/2019 CLINICAL DATA:  68 year old male with history of colorectal cancer. Staging examination. EXAM: CT CHEST, ABDOMEN, AND PELVIS WITH CONTRAST TECHNIQUE: Multidetector CT imaging of the chest,  abdomen and pelvis was performed following the standard protocol during bolus administration of intravenous contrast. CONTRAST:  152mL OMNIPAQUE IOHEXOL 300 MG/ML  SOLN COMPARISON:  No priors. FINDINGS: CT CHEST FINDINGS Cardiovascular: Heart size is normal. There is no significant pericardial fluid, thickening or pericardial calcification. There is aortic atherosclerosis, as well as atherosclerosis of the great vessels of the mediastinum and the coronary arteries, including calcified atherosclerotic plaque in the ramus intermedius coronary artery. Mediastinum/Nodes: No pathologically enlarged mediastinal or hilar lymph nodes. Esophagus is unremarkable in appearance. No axillary lymphadenopathy. Lungs/Pleura: Dependent areas of subsegmental atelectasis are noted in the lower lobes of the lungs bilaterally. No acute consolidative airspace disease. No pleural effusions. A few scattered tiny 2-3 mm pulmonary nodules are noted in the lungs bilaterally, nonspecific. No larger more suspicious appearing pulmonary nodules or masses are noted. Musculoskeletal: There are no aggressive appearing lytic or blastic lesions noted in the visualized portions of the skeleton. CT ABDOMEN PELVIS FINDINGS Hepatobiliary: No suspicious cystic or solid hepatic lesions. No intra or extrahepatic biliary ductal dilatation. Gallbladder is normal in appearance. Pancreas: No pancreatic mass. No pancreatic ductal dilatation. No pancreatic or peripancreatic fluid collections or inflammatory changes. Spleen: Unremarkable. Adrenals/Urinary Tract: Tiny subcentimeter low-attenuation lesions in the right kidney, too small to definitively characterize, but statistically likely to represent tiny cysts. Left kidney and bilateral adrenal glands are normal in appearance. No hydroureteronephrosis. Urinary bladder is normal in appearance. Stomach/Bowel: Normal appearance of the stomach. No pathologic dilatation of small bowel or colon. Masslike irregular  mural thickening of the rectum which is difficult to discretely measure given its irregular appearance, but is best demonstrated on axial image 116 of series 504, presumably a primary rectal neoplasm. Notably, this obliterates the fat plane in the mesorectal fat between the lesion and the right seminal vesicle (best appreciated on axial image 116 of series 504). The appendix is not confidently identified and may be surgically absent. Regardless, there are no inflammatory changes noted adjacent to the cecum to suggest the presence of an acute appendicitis at this time. Vascular/Lymphatic: Aortic atherosclerosis, without evidence of aneurysm or dissection in the abdominal or pelvic vasculature. Several prominent but nonenlarged mesorectal lymph nodes are noted measuring up to 7 mm in short axis. No other lymphadenopathy noted in the abdomen or pelvis. Reproductive: There is some obscuration of the fat plane between the right seminal vesicle and the  rectal mass (discussed above). Prostate gland and seminal vesicles are otherwise unremarkable in appearance. Other: No significant volume of ascites.  No pneumoperitoneum. Musculoskeletal: There are no aggressive appearing lytic or blastic lesions noted in the visualized portions of the skeleton. IMPRESSION: 1. Rectal mass with evidence of local invasion into the mesorectal fat and potential involvement of the right seminal vesicle. Multiple borderline enlarged mesorectal lymph nodes. No other definite evidence of distal metastatic disease elsewhere in the abdomen or pelvis. 2. Small pulmonary nodules measuring 2-3 mm in the lungs bilaterally, highly nonspecific and statistically likely benign. However, close attention on follow-up studies is recommended to ensure their stability as metastatic disease is not entirely excluded. 3. Aortic atherosclerosis, in addition to single-vessel coronary artery disease. Assessment for potential risk factor modification, dietary therapy  or pharmacologic therapy may be warranted, if clinically indicated. 4. Additional incidental findings, as above. Electronically Signed   By: Vinnie Langton M.D.   On: 09/13/2019 19:56   US Venous Img Upper Uni Right  Result Date: 09/26/2019 CLINICAL DATA:  Right upper extremity edema. EXAM: RIGHT UPPER EXTREMITY VENOUS DOPPLER ULTRASOUND TECHNIQUE: Gray-scale sonography with graded compression, as well as color Doppler and duplex ultrasound were performed to evaluate the upper extremity deep venous system from the level of the subclavian vein and including the jugular, axillary, basilic, radial, ulnar and upper cephalic vein. Spectral Doppler was utilized to evaluate flow at rest and with distal augmentation maneuvers. COMPARISON:  None. FINDINGS: Contralateral Subclavian Vein: Respiratory phasicity is normal and symmetric with the symptomatic side. No evidence of thrombus. Normal compressibility. Internal Jugular Vein: No evidence of thrombus. Normal compressibility, respiratory phasicity and response to augmentation. Subclavian Vein: No evidence of thrombus. Normal compressibility, respiratory phasicity and response to augmentation. Axillary Vein: No evidence of thrombus. Normal compressibility, respiratory phasicity and response to augmentation. Cephalic Vein: Thrombus is identified in the right cephalic vein beginning just above the wrist and continuing to just above the antecubital fossa. Basilic Vein: No evidence of thrombus. Normal compressibility, respiratory phasicity and response to augmentation. Brachial Veins: No evidence of thrombus. Normal compressibility, respiratory phasicity and response to augmentation. Radial Veins: No evidence of thrombus. Normal compressibility, respiratory phasicity and response to augmentation. Ulnar Veins: No evidence of thrombus. Normal compressibility, respiratory phasicity and response to augmentation. Venous Reflux:  None visualized. Other Findings:  No abnormal  fluid collections identified. IMPRESSION: 1. No evidence deep vein thrombosis in the right upper extremity. 2. Positive for superficial thrombophlebitis of the cephalic vein from just above the wrist to just proximal to the antecubital fossa. Electronically Signed   By: Aletta Edouard M.D.   On: 09/26/2019 16:00   DG CHEST PORT 1 VIEW  Result Date: 09/15/2019 CLINICAL DATA:  69 year old male status post Port-A-Cath placement EXAM: PORTABLE CHEST 1 VIEW COMPARISON:  Chest radiograph dated 03/21/2018 and CT dated 09/13/2019 FINDINGS: Right-sided Port-A-Cath with tip at the cavoatrial junction. There is mild cardiomegaly with mild vascular congestion. No focal consolidation or pneumothorax. Trace right pleural effusion may be present. Atherosclerotic calcification of the aorta. No acute osseous pathology. IMPRESSION: Port-A-Cath with tip at the cavoatrial junction. Electronically Signed   By: Anner Crete M.D.   On: 09/15/2019 17:40   DG C-Arm 1-60 Min-No Report  Result Date: 09/15/2019 Fluoroscopy was utilized by the requesting physician.  No radiographic interpretation.     Assessment and plan 1. Rectal cancer ([place])   2. Iron deficiency anemia due to chronic blood loss   3. Superficial thrombophlebitis of  right upper extremity   4. Encounter for antineoplastic chemotherapy   5. 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  He tolerates well. Labs are reviewed and discussed with patient. Proceed with cycle 2 FOLFOX, I will increase his Oxaliplatin to 80mg /m2.   #Iron deficiency anemia, Hemoglobin is stable and improved.   #H. pylori gastritis, patient to finish course of antibiotics and PPI #Vitamin B12 deficiency, B12 level has improved.   Continue B12 supplementation.  #Right upper extremity superficial thrombophlebitis.  His symptoms has improved.    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  Vitamin B12 deficiency may contribute to that and is being treated for B12 deficiency. #Patient will establish care with palliative care service.  We spent sufficient time to discuss many aspect of care, questions were answered to patient's satisfaction. Patient will follow up in 2 weeks evaluation prior to next cycle of treatment. [name], MD, PhD Hematology Oncology [facility] Pager- [phone] 10/11/2019

## 2019-10-11 NOTE — Progress Notes (Signed)
Patient's nephew reports he was having bleeding episodes with bowel movements on Saturday.  Patient reports the bleeding has improved.

## 2019-10-11 NOTE — Progress Notes (Signed)
[place]  Telephone:([phone] [phone]   Name: Christopher Delacruz Date: 10/11/2019 MRN: [id number]  DOB: 12/18/1950  Patient Care Team: [name], MD as PCP - General (Family Medicine) Breauna Mazzeo, Kirt Boys, NP as Nurse Practitioner (Hospice and Palliative Medicine) Christopher Server, MD as Consulting Physician (Oncology) [name], MD as Referring Physician (Radiation Oncology) [name], NP as Nurse Practitioner    REASON FOR CONSULTATION: Christopher Delacruz is a 69 y.o. male with multiple medical problems including history of CVA, diabetes, hypertension, and B12 deficiency.  Patient was hospitalized 09/07/2019-09/16/2019 with a GI bleed.  Patient underwent EGD and colonoscopy was found to have a large colorectal mass.  Biopsy positive for adenocarcinoma.  Of note, patient was seen by psychiatry and found to lack capacity for medical decision-making.  Medical oncology met with patient's nephew, Christopher Delacruz, who is the healthcare power of attorney.  Family opted to pursue treatment and patient was started on neoadjuvant chemotherapy.  With plan for future XRT.  Palliative care was consulted up address goals.  SOCIAL HISTORY:     reports that he has never smoked. He has never used smokeless tobacco. He reports that he does not drink alcohol and does not use drugs.   Patient is a resident at [facility].  His niece and nephew are involved in decision-making.  Patient has no children.  He was married but twice divorced.  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   ADVANCE DIRECTIVES:  On file  CODE STATUS: Full code  PAST MEDICAL HISTORY: Past Medical History:  Diagnosis Date  . B12 deficiency 09/17/2019  . CVA (cerebral vascular accident) ([place])   . Diabetes mellitus ([place])   . Hyperlipidemia   . Hypertension     PAST SURGICAL HISTORY:  Past Surgical History:   Procedure Laterality Date  . COLONOSCOPY N/A 09/11/2019   Procedure: COLONOSCOPY;  Surgeon: [name], MD;  Location: [facility] ENDOSCOPY;  Service: Endoscopy;  Laterality: N/A;  . COLONOSCOPY WITH PROPOFOL N/A 09/12/2019   Procedure: COLONOSCOPY WITH PROPOFOL;  Surgeon: [name], MD;  Location: [facility] ENDOSCOPY;  Service: Gastroenterology;  Laterality: N/A;  . COLONOSCOPY WITH PROPOFOL N/A 09/13/2019   Procedure: COLONOSCOPY WITH PROPOFOL;  Surgeon: [name], MD;  Location: [facility] ENDOSCOPY;  Service: Gastroenterology;  Laterality: N/A;  . ESOPHAGOGASTRODUODENOSCOPY N/A 09/11/2019   Procedure: ESOPHAGOGASTRODUODENOSCOPY (EGD);  Surgeon: [name], MD;  Location: [facility] ENDOSCOPY;  Service: Endoscopy;  Laterality: N/A;  . EXPLORE EYE SOCKET    . PORTACATH PLACEMENT N/A 09/15/2019   Procedure: INSERTION PORT-A-CATH;  Surgeon: Jules Husbands, MD;  Location: [facility] ORS;  Service: General;  Laterality: N/A;    HEMATOLOGY/ONCOLOGY HISTORY:  Oncology History  Rectal cancer ([place])  09/14/2019 Initial Diagnosis   Rectal cancer ([place])   09/22/2019 Cancer Staging   Staging form: Colon and Rectum, AJCC 8th Edition - Clinical stage from 09/22/2019: Stage IIIC (cT4b, cN2, cM0) - Signed by Christopher Server, MD on 09/22/2019   09/27/2019 -  Chemotherapy   The patient had palonosetron (ALOXI) injection 0.25 mg, 0.25 mg, Intravenous,  Once, 2 of 8 cycles Administration: 0.25 mg (09/27/2019), 0.25 mg (10/11/2019) leucovorin 900 mg in dextrose 5 % 250 mL infusion, 398 mg/m2 = 904 mg, Intravenous,  Once, 2 of 8 cycles Administration: 900 mg (09/27/2019), 900 mg (10/11/2019) oxaliplatin (ELOXATIN) 160 mg in dextrose 5 % 500 mL chemo infusion, 70 mg/m2 = 160 mg (100 %  of original dose 70 mg/m2), Intravenous,  Once, 2 of 8 cycles Dose modification: 70 mg/m2 (original dose 70 mg/m2, Cycle 1, Reason: Provider Judgment), 75 mg/m2 (original dose 70 mg/m2, Cycle 2, Reason: Provider Judgment), 80 mg/m2 (original dose 70 mg/m2, Cycle 2,  Reason: Provider Judgment) Administration: 160 mg (09/27/2019), 180 mg (10/11/2019) fluorouracil (ADRUCIL) 5,400 mg in sodium chloride 0.9 % 142 mL chemo infusion, 2,400 mg/m2 = 5,400 mg, Intravenous, 1 Day/Dose, 2 of 8 cycles Administration: 5,400 mg (09/27/2019)  for chemotherapy treatment.      ALLERGIES:  has No Known Allergies.  MEDICATIONS:  Current Outpatient Medications  Medication Sig Dispense Refill  . amLODipine (NORVASC) 10 MG tablet Take 1 tablet (10 mg total) by mouth daily. 30 tablet 1  . aspirin EC 81 MG EC tablet Take 1 tablet (81 mg total) by mouth daily. Swallow whole. 30 tablet 11  . clopidogrel (PLAVIX) 75 MG tablet Take 1 tablet (75 mg total) by mouth daily. 30 tablet 1  . ferrous sulfate 325 (65 FE) MG tablet Take 1 tablet (325 mg total) by mouth daily. 30 tablet 3  . finasteride (PROSCAR) 5 MG tablet Take 1 tablet (5 mg total) by mouth daily. 30 tablet 0  . lidocaine-prilocaine (EMLA) cream Apply to affected area once (Patient not taking: Reported on 09/27/2019) 30 g 3  . Multiple Vitamin (MULTIVITAMIN WITH MINERALS) TABS tablet Take 1 tablet by mouth daily. 30 tablet 0  . ondansetron (ZOFRAN) 8 MG tablet Take 1 tablet (8 mg total) by mouth 2 (two) times daily as needed for refractory nausea / vomiting. Start on day 3 after chemotherapy. 30 tablet 1  . pantoprazole (PROTONIX) 40 MG tablet Take 1 tablet (40 mg total) by mouth 2 (two) times daily before a meal for 10 days. 20 tablet 0  . prochlorperazine (COMPAZINE) 10 MG tablet Take 1 tablet (10 mg total) by mouth every 6 (six) hours as needed (Nausea or vomiting). 30 tablet 1  . tamsulosin (FLOMAX) 0.4 MG CAPS capsule Take 1 capsule (0.4 mg total) by mouth daily. 30 capsule 11  . vitamin B-12 1000 MCG tablet Take 1 tablet (1,000 mcg total) by mouth daily. 30 tablet 0   No current facility-administered medications for this visit.   Facility-Administered Medications Ordered in Other Visits  Medication Dose Route  Frequency Provider Last Rate Last Admin  . fluorouracil (ADRUCIL) 5,400 mg in sodium chloride 0.9 % 142 mL chemo infusion  2,400 mg/m2 (Treatment Plan Recorded) Intravenous 1 day or 1 dose Christopher Server, MD   5,400 mg at 10/11/19 1352  . heparin lock flush 100 unit/mL  500 Units Intravenous Once Christopher Server, MD      . sodium chloride flush (NS) 0.9 % injection 10 mL  10 mL Intracatheter PRN Christopher Server, MD   10 mL at 10/11/19 1352    VITAL SIGNS: There were no vitals taken for this visit. There were no vitals filed for this visit.  Estimated body mass index is 29.28 kg/m as calculated from the following:   Height as of 09/22/19: 6' 1.62" (1.87 m).   Weight as of an earlier encounter on 10/11/19: 225 lb 11.2 oz (102.4 kg).  LABS: CBC:    Component Value Date/Time   WBC 7.5 10/11/2019 0845   HGB 9.9 (L) 10/11/2019 0845   HCT 31.7 (L) 10/11/2019 0845   PLT 245 10/11/2019 0845   MCV 80.9 10/11/2019 0845   NEUTROABS 4.9 10/11/2019 0845   LYMPHSABS 1.7 10/11/2019 0845  MONOABS 0.6 10/11/2019 0845   EOSABS 0.2 10/11/2019 0845   BASOSABS 0.1 10/11/2019 0845   Comprehensive Metabolic Panel:    Component Value Date/Time   NA 142 10/11/2019 0845   K 4.0 10/11/2019 0845   CL 109 10/11/2019 0845   CO2 26 10/11/2019 0845   BUN 16 10/11/2019 0845   CREATININE 1.49 (H) 10/11/2019 0845   GLUCOSE 97 10/11/2019 0845   CALCIUM 8.7 (L) 10/11/2019 0845   AST 17 10/11/2019 0845   ALT 19 10/11/2019 0845   ALKPHOS 67 10/11/2019 0845   BILITOT 0.7 10/11/2019 0845   PROT 6.7 10/11/2019 0845   ALBUMIN 3.3 (L) 10/11/2019 0845    RADIOGRAPHIC STUDIES: CT CHEST W CONTRAST  Result Date: 09/13/2019 CLINICAL DATA:  69 year old male with history of colorectal cancer. Staging examination. EXAM: CT CHEST, ABDOMEN, AND PELVIS WITH CONTRAST TECHNIQUE: Multidetector CT imaging of the chest, abdomen and pelvis was performed following the standard protocol during bolus administration of intravenous contrast. CONTRAST:   136m OMNIPAQUE IOHEXOL 300 MG/ML  SOLN COMPARISON:  No priors. FINDINGS: CT CHEST FINDINGS Cardiovascular: Heart size is normal. There is no significant pericardial fluid, thickening or pericardial calcification. There is aortic atherosclerosis, as well as atherosclerosis of the great vessels of the mediastinum and the coronary arteries, including calcified atherosclerotic plaque in the ramus intermedius coronary artery. Mediastinum/Nodes: No pathologically enlarged mediastinal or hilar lymph nodes. Esophagus is unremarkable in appearance. No axillary lymphadenopathy. Lungs/Pleura: Dependent areas of subsegmental atelectasis are noted in the lower lobes of the lungs bilaterally. No acute consolidative airspace disease. No pleural effusions. A few scattered tiny 2-3 mm pulmonary nodules are noted in the lungs bilaterally, nonspecific. No larger more suspicious appearing pulmonary nodules or masses are noted. Musculoskeletal: There are no aggressive appearing lytic or blastic lesions noted in the visualized portions of the skeleton. CT ABDOMEN PELVIS FINDINGS Hepatobiliary: No suspicious cystic or solid hepatic lesions. No intra or extrahepatic biliary ductal dilatation. Gallbladder is normal in appearance. Pancreas: No pancreatic mass. No pancreatic ductal dilatation. No pancreatic or peripancreatic fluid collections or inflammatory changes. Spleen: Unremarkable. Adrenals/Urinary Tract: Tiny subcentimeter low-attenuation lesions in the right kidney, too small to definitively characterize, but statistically likely to represent tiny cysts. Left kidney and bilateral adrenal glands are normal in appearance. No hydroureteronephrosis. Urinary bladder is normal in appearance. Stomach/Bowel: Normal appearance of the stomach. No pathologic dilatation of small bowel or colon. Masslike irregular mural thickening of the rectum which is difficult to discretely measure given its irregular appearance, but is best demonstrated on  axial image 116 of series 504, presumably a primary rectal neoplasm. Notably, this obliterates the fat plane in the mesorectal fat between the lesion and the right seminal vesicle (best appreciated on axial image 116 of series 504). The appendix is not confidently identified and may be surgically absent. Regardless, there are no inflammatory changes noted adjacent to the cecum to suggest the presence of an acute appendicitis at this time. Vascular/Lymphatic: Aortic atherosclerosis, without evidence of aneurysm or dissection in the abdominal or pelvic vasculature. Several prominent but nonenlarged mesorectal lymph nodes are noted measuring up to 7 mm in short axis. No other lymphadenopathy noted in the abdomen or pelvis. Reproductive: There is some obscuration of the fat plane between the right seminal vesicle and the rectal mass (discussed above). Prostate gland and seminal vesicles are otherwise unremarkable in appearance. Other: No significant volume of ascites.  No pneumoperitoneum. Musculoskeletal: There are no aggressive appearing lytic or blastic lesions noted in the  visualized portions of the skeleton. IMPRESSION: 1. Rectal mass with evidence of local invasion into the mesorectal fat and potential involvement of the right seminal vesicle. Multiple borderline enlarged mesorectal lymph nodes. No other definite evidence of distal metastatic disease elsewhere in the abdomen or pelvis. 2. Small pulmonary nodules measuring 2-3 mm in the lungs bilaterally, highly nonspecific and statistically likely benign. However, close attention on follow-up studies is recommended to ensure their stability as metastatic disease is not entirely excluded. 3. Aortic atherosclerosis, in addition to single-vessel coronary artery disease. Assessment for potential risk factor modification, dietary therapy or pharmacologic therapy may be warranted, if clinically indicated. 4. Additional incidental findings, as above. Electronically  Signed   By: Vinnie Langton M.D.   On: 09/13/2019 19:56   MR BRAIN W WO CONTRAST  Result Date: 09/15/2019 CLINICAL DATA:  Provided history: Metastatic disease evaluation. Additional history provided: Recent diagnosis of rectal cancer, evaluate for metastatic disease. EXAM: MRI HEAD WITHOUT AND WITH CONTRAST TECHNIQUE: Multiplanar, multiecho pulse sequences of the brain and surrounding structures were obtained without and with intravenous contrast. CONTRAST:  18m GADAVIST GADOBUTROL 1 MMOL/ML IV SOLN COMPARISON:  Head CT 10/02/2018, MRI/MRA head 03/21/2018 and 03/22/2018 FINDINGS: Brain: Stable, moderate generalized parenchymal atrophy. There is a punctate focus of restricted diffusion within the left parietal white matter without appreciable corresponding enhancement (series 5, image 29) (series 7, image 9). Redemonstrated chronic hemorrhagic infarct within the left basal ganglia. Chronic lacunar infarcts within thalami have progressed as compared to the MRI of 03/21/2018. Redemonstrated chronic hemorrhagic infarct within the posterior left midbrain. Chronic infarcts within the pons have progressed. As before, there is background advanced chronic small vessel ischemic disease within the cerebral white matter. Wallerian degeneration of the left cerebral peduncle. No evidence of acute infarct elsewhere within the brain. There are few scattered supratentorial chronic microhemorrhages. No abnormal intracranial enhancement. No extra-axial fluid collection. No midline shift. Vascular: Expected proximal arterial flow voids. Skull and upper cervical spine: No focal suspicious marrow lesion. There is precontrast T1 hyperintensity within the ventral C3 vertebral body, likely benign. Sinuses/Orbits: Visualized orbits show no acute finding. Mild ethmoid sinus mucosal thickening. No significant mastoid effusion. IMPRESSION: There is a punctate focus of restricted diffusion within the left parietal white matter without  appreciable corresponding enhancement. This most likely reflects an acute or early subacute small vessel infarct, although attention is recommended on follow-up. No evidence of intracranial metastatic disease elsewhere. Multiple chronic infarcts as described, some of which are new as compared to the prior MRI of 03/21/2018. Stable background moderate generalized parenchymal atrophy and advanced chronic small vessel ischemic disease. Mild ethmoid sinus mucosal thickening. Electronically Signed   By: KKellie SimmeringDO   On: 09/15/2019 15:07   MR PELVIS WO CONTRAST  Result Date: 09/14/2019 CLINICAL DATA:  Rectal mass with potential local invasion based on prior imaging EXAM: MRI PELVIS WITHOUT CONTRAST TECHNIQUE: Multiplanar multisequence MR imaging of the pelvis was performed. No intravenous contrast was administered. Small amount of [place]oreagel was administered per rectum to optimize tumor evaluation. COMPARISON:  09/13/2019 FINDINGS: TUMOR LOCATION Tumor distance from Anal Verge/Skin Surface:  13.3 cm Tumor distance to Internal Anal Sphincter: 5.9 cm TUMOR DESCRIPTION Circumferential Extent: Circumferential tumor in the upper margin of the tumor. The tumor favors the RIGHT anterolateral rectum and is eccentric towards the inferior margin. Tumor Length: 8.2 cm T - CATEGORY Extension through Muscularis Propria: Yes, approximately 1.5-1.6 cm beyond the confines of the rectum in the axial plane. At least  6-7 mm beyond in the sagittal. Note that bulging of the anterior rectum with a maintained defined T2 hypointense rim is noted along the RIGHT anterolateral margin. Shortest Distance of any tumor/node from Mesorectal Fascia: 0 mm Extramural Vascular Invasion/Tumor Thrombus: Yes expanded low signal vein arising from an area of extra rectal extension along the LEFT lateral rectal margin (image 20, series 8) also on image 23 of series 9. Expanded vein with low signal also demonstrated on image 31 of series 8 Invasion of  Anterior Peritoneal Reflection: Yes Involvement of Adjacent Organs or Pelvic Sidewall: Yes, invasion of the seminal vesicles best seen on image 21 of series 8, tumor passes through the anterior peritoneal reflection at this level extending to the seminal vesicles greatest on the RIGHT just to the RIGHT of midline. Levator Ani Involvement: No N - CATEGORY Mesorectal Lymph Nodes >=5m: N2 with multiple lymph nodes in the mesorectum extending high along the superior rectal vein and into the nodal chains along the inferior IMV as seen on the prior CT. Greater than 4 lymph nodes meet this criteria Extra-mesorectal Lymphadenopathy: Small lymph nodes along venous drainage pathways and lymphatic pathways above the mesorectum, no pelvic sidewall adenopathy. Other: Urinary Tract: Urinary bladder is grossly normal. No signs of distal ureteral dilation. Bowel: As above Vascular/Lymphatic: Lymphatic assessment as described. Reproductive: Seminal vesicle involvement as discussed. Other: None Musculoskeletal: Limited assessment of musculoskeletal structures is unremarkable. 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 Rectal adenocarcinoma N stage: N2 with superior rectal and nodes about the sacral promontory and just above, bordering on nodes outside of regional nodal stations. Distance from tumor to the internal anal sphincter is 13.3 cm. Electronically Signed   By: GZetta BillsM.D.   On: 09/14/2019 08:18   CT ABDOMEN PELVIS W CONTRAST  Result Date: 09/13/2019 CLINICAL DATA:  69year old male with history of colorectal cancer. Staging examination. EXAM: CT CHEST, ABDOMEN, AND PELVIS WITH CONTRAST TECHNIQUE: Multidetector CT imaging of the chest, abdomen and pelvis was performed following the standard protocol during bolus administration of intravenous contrast. CONTRAST:  1065mOMNIPAQUE IOHEXOL 300 MG/ML  SOLN COMPARISON:  No priors. FINDINGS: CT CHEST FINDINGS Cardiovascular: Heart  size is normal. There is no significant pericardial fluid, thickening or pericardial calcification. There is aortic atherosclerosis, as well as atherosclerosis of the great vessels of the mediastinum and the coronary arteries, including calcified atherosclerotic plaque in the ramus intermedius coronary artery. Mediastinum/Nodes: No pathologically enlarged mediastinal or hilar lymph nodes. Esophagus is unremarkable in appearance. No axillary lymphadenopathy. Lungs/Pleura: Dependent areas of subsegmental atelectasis are noted in the lower lobes of the lungs bilaterally. No acute consolidative airspace disease. No pleural effusions. A few scattered tiny 2-3 mm pulmonary nodules are noted in the lungs bilaterally, nonspecific. No larger more suspicious appearing pulmonary nodules or masses are noted. Musculoskeletal: There are no aggressive appearing lytic or blastic lesions noted in the visualized portions of the skeleton. CT ABDOMEN PELVIS FINDINGS Hepatobiliary: No suspicious cystic or solid hepatic lesions. No intra or extrahepatic biliary ductal dilatation. Gallbladder is normal in appearance. Pancreas: No pancreatic mass. No pancreatic ductal dilatation. No pancreatic or peripancreatic fluid collections or inflammatory changes. Spleen: Unremarkable. Adrenals/Urinary Tract: Tiny subcentimeter low-attenuation lesions in the right kidney, too small to definitively characterize, but statistically likely to represent tiny cysts. Left kidney and bilateral adrenal glands are normal in appearance. No hydroureteronephrosis. Urinary bladder is normal in appearance. Stomach/Bowel: Normal appearance of the stomach. No pathologic dilatation of small bowel or  colon. Masslike irregular mural thickening of the rectum which is difficult to discretely measure given its irregular appearance, but is best demonstrated on axial image 116 of series 504, presumably a primary rectal neoplasm. Notably, this obliterates the fat plane in the  mesorectal fat between the lesion and the right seminal vesicle (best appreciated on axial image 116 of series 504). The appendix is not confidently identified and may be surgically absent. Regardless, there are no inflammatory changes noted adjacent to the cecum to suggest the presence of an acute appendicitis at this time. Vascular/Lymphatic: Aortic atherosclerosis, without evidence of aneurysm or dissection in the abdominal or pelvic vasculature. Several prominent but nonenlarged mesorectal lymph nodes are noted measuring up to 7 mm in short axis. No other lymphadenopathy noted in the abdomen or pelvis. Reproductive: There is some obscuration of the fat plane between the right seminal vesicle and the rectal mass (discussed above). Prostate gland and seminal vesicles are otherwise unremarkable in appearance. Other: No significant volume of ascites.  No pneumoperitoneum. Musculoskeletal: There are no aggressive appearing lytic or blastic lesions noted in the visualized portions of the skeleton. IMPRESSION: 1. Rectal mass with evidence of local invasion into the mesorectal fat and potential involvement of the right seminal vesicle. Multiple borderline enlarged mesorectal lymph nodes. No other definite evidence of distal metastatic disease elsewhere in the abdomen or pelvis. 2. Small pulmonary nodules measuring 2-3 mm in the lungs bilaterally, highly nonspecific and statistically likely benign. However, close attention on follow-up studies is recommended to ensure their stability as metastatic disease is not entirely excluded. 3. Aortic atherosclerosis, in addition to single-vessel coronary artery disease. Assessment for potential risk factor modification, dietary therapy or pharmacologic therapy may be warranted, if clinically indicated. 4. Additional incidental findings, as above. Electronically Signed   By: Vinnie Langton M.D.   On: 09/13/2019 19:56   US Venous Img Upper Uni Right  Result Date:  09/26/2019 CLINICAL DATA:  Right upper extremity edema. EXAM: RIGHT UPPER EXTREMITY VENOUS DOPPLER ULTRASOUND TECHNIQUE: Gray-scale sonography with graded compression, as well as color Doppler and duplex ultrasound were performed to evaluate the upper extremity deep venous system from the level of the subclavian vein and including the jugular, axillary, basilic, radial, ulnar and upper cephalic vein. Spectral Doppler was utilized to evaluate flow at rest and with distal augmentation maneuvers. COMPARISON:  None. FINDINGS: Contralateral Subclavian Vein: Respiratory phasicity is normal and symmetric with the symptomatic side. No evidence of thrombus. Normal compressibility. Internal Jugular Vein: No evidence of thrombus. Normal compressibility, respiratory phasicity and response to augmentation. Subclavian Vein: No evidence of thrombus. Normal compressibility, respiratory phasicity and response to augmentation. Axillary Vein: No evidence of thrombus. Normal compressibility, respiratory phasicity and response to augmentation. Cephalic Vein: Thrombus is identified in the right cephalic vein beginning just above the wrist and continuing to just above the antecubital fossa. Basilic Vein: No evidence of thrombus. Normal compressibility, respiratory phasicity and response to augmentation. Brachial Veins: No evidence of thrombus. Normal compressibility, respiratory phasicity and response to augmentation. Radial Veins: No evidence of thrombus. Normal compressibility, respiratory phasicity and response to augmentation. Ulnar Veins: No evidence of thrombus. Normal compressibility, respiratory phasicity and response to augmentation. Venous Reflux:  None visualized. Other Findings:  No abnormal fluid collections identified. IMPRESSION: 1. No evidence deep vein thrombosis in the right upper extremity. 2. Positive for superficial thrombophlebitis of the cephalic vein from just above the wrist to just proximal to the antecubital  fossa. Electronically Signed   By: [name]  Kathlene Cote M.D.   On: 09/26/2019 16:00   DG CHEST PORT 1 VIEW  Result Date: 09/15/2019 CLINICAL DATA:  69 year old male status post Port-A-Cath placement EXAM: PORTABLE CHEST 1 VIEW COMPARISON:  Chest radiograph dated 03/21/2018 and CT dated 09/13/2019 FINDINGS: Right-sided Port-A-Cath with tip at the cavoatrial junction. There is mild cardiomegaly with mild vascular congestion. No focal consolidation or pneumothorax. Trace right pleural effusion may be present. Atherosclerotic calcification of the aorta. No acute osseous pathology. IMPRESSION: Port-A-Cath with tip at the cavoatrial junction. Electronically Signed   By: Anner Crete M.D.   On: 09/15/2019 17:40   DG C-Arm 1-60 Min-No Report  Result Date: 09/15/2019 Fluoroscopy was utilized by the requesting physician.  No radiographic interpretation.    PERFORMANCE STATUS (ECOG) : 3 - Symptomatic, >50% confined to bed  Review of Systems Unless otherwise noted, a complete review of systems is negative.  Physical Exam General: NAD, in wheelchair Pulmonary: unlabored Extremities: no edema, no joint deformities Skin: no rashes Neurological: Weakness but otherwise nonfocal  IMPRESSION: Follow-up visit in infusion.  Patient reports he is doing well.  He denies any significant changes or concerns.  No symptomatic complaints at present.  He continues to reside at [facility] for rehab.  He says that rehab is going well and that he is improving weekly.  Appreciate palliative care following him at [facility].   PLAN: -Full code/full scope -Follow-up MyChart visit in about a month   Patient expressed understanding and was in agreement with this plan. He also understands that He can call the clinic at any time with any questions, concerns, or complaints.     Time Total: 10 minutes  Visit consisted of counseling and education dealing with the complex and emotionally intense issues of symptom management and  palliative care in the setting of serious and potentially life-threatening illness.Greater than 50%  of this time was spent counseling and coordinating care related to the above assessment and plan.  Signed by: Altha Harm, PhD, NP-C

## 2019-10-13 ENCOUNTER — Inpatient Hospital Stay: Payer: Medicare Other

## 2019-10-13 ENCOUNTER — Other Ambulatory Visit: Payer: Self-pay

## 2019-10-13 DIAGNOSIS — C20 Malignant neoplasm of rectum: Secondary | ICD-10-CM

## 2019-10-13 DIAGNOSIS — Z5111 Encounter for antineoplastic chemotherapy: Secondary | ICD-10-CM | POA: Diagnosis not present

## 2019-10-13 MED ORDER — SODIUM CHLORIDE 0.9% FLUSH
10.0000 mL | INTRAVENOUS | Status: DC | PRN
  Administered 2019-10-13: 10 mL
  Filled 2019-10-13: qty 10

## 2019-10-13 MED ORDER — HEPARIN SOD (PORK) LOCK FLUSH 100 UNIT/ML IV SOLN
INTRAVENOUS | Status: AC
  Filled 2019-10-13: qty 5

## 2019-10-13 MED ORDER — HEPARIN SOD (PORK) LOCK FLUSH 100 UNIT/ML IV SOLN
500.0000 [IU] | Freq: Once | INTRAVENOUS | Status: AC | PRN
  Administered 2019-10-13: 500 [IU]
  Filled 2019-10-13: qty 5

## 2019-10-25 ENCOUNTER — Inpatient Hospital Stay (HOSPITAL_BASED_OUTPATIENT_CLINIC_OR_DEPARTMENT_OTHER): Payer: Medicare Other | Admitting: Oncology

## 2019-10-25 ENCOUNTER — Encounter: Payer: Self-pay | Admitting: Oncology

## 2019-10-25 ENCOUNTER — Other Ambulatory Visit: Payer: Self-pay

## 2019-10-25 ENCOUNTER — Inpatient Hospital Stay: Payer: Medicare Other

## 2019-10-25 ENCOUNTER — Telehealth: Payer: Self-pay | Admitting: Family Medicine

## 2019-10-25 VITALS — BP 152/71 | HR 60 | Temp 96.7°F | Resp 18 | Wt 225.4 lb

## 2019-10-25 DIAGNOSIS — D500 Iron deficiency anemia secondary to blood loss (chronic): Secondary | ICD-10-CM

## 2019-10-25 DIAGNOSIS — C20 Malignant neoplasm of rectum: Secondary | ICD-10-CM

## 2019-10-25 DIAGNOSIS — Z5111 Encounter for antineoplastic chemotherapy: Secondary | ICD-10-CM

## 2019-10-25 DIAGNOSIS — E538 Deficiency of other specified B group vitamins: Secondary | ICD-10-CM

## 2019-10-25 LAB — CBC WITH DIFFERENTIAL/PLATELET
Abs Immature Granulocytes: 0.03 10*3/uL (ref 0.00–0.07)
Basophils Absolute: 0.1 10*3/uL (ref 0.0–0.1)
Basophils Relative: 1 %
Eosinophils Absolute: 0.2 10*3/uL (ref 0.0–0.5)
Eosinophils Relative: 2 %
HCT: 34.2 % — ABNORMAL LOW (ref 39.0–52.0)
Hemoglobin: 10.8 g/dL — ABNORMAL LOW (ref 13.0–17.0)
Immature Granulocytes: 0 %
Lymphocytes Relative: 22 %
Lymphs Abs: 1.8 10*3/uL (ref 0.7–4.0)
MCH: 26 pg (ref 26.0–34.0)
MCHC: 31.6 g/dL (ref 30.0–36.0)
MCV: 82.2 fL (ref 80.0–100.0)
Monocytes Absolute: 0.8 10*3/uL (ref 0.1–1.0)
Monocytes Relative: 10 %
Neutro Abs: 5.3 10*3/uL (ref 1.7–7.7)
Neutrophils Relative %: 65 %
Platelets: 223 10*3/uL (ref 150–400)
RBC: 4.16 MIL/uL — ABNORMAL LOW (ref 4.22–5.81)
RDW: 23.7 % — ABNORMAL HIGH (ref 11.5–15.5)
WBC: 8.1 10*3/uL (ref 4.0–10.5)
nRBC: 0 % (ref 0.0–0.2)

## 2019-10-25 LAB — COMPREHENSIVE METABOLIC PANEL
ALT: 12 U/L (ref 0–44)
AST: 17 U/L (ref 15–41)
Albumin: 3.3 g/dL — ABNORMAL LOW (ref 3.5–5.0)
Alkaline Phosphatase: 63 U/L (ref 38–126)
Anion gap: 8 (ref 5–15)
BUN: 17 mg/dL (ref 8–23)
CO2: 25 mmol/L (ref 22–32)
Calcium: 8.6 mg/dL — ABNORMAL LOW (ref 8.9–10.3)
Chloride: 108 mmol/L (ref 98–111)
Creatinine, Ser: 1.57 mg/dL — ABNORMAL HIGH (ref 0.61–1.24)
GFR calc Af Amer: 51 mL/min — ABNORMAL LOW (ref >60)
GFR calc non Af Amer: 44 mL/min — ABNORMAL LOW (ref >60)
Glucose, Bld: 144 mg/dL — ABNORMAL HIGH (ref 70–99)
Potassium: 3.8 mmol/L (ref 3.5–5.1)
Sodium: 141 mmol/L (ref 135–145)
Total Bilirubin: 0.5 mg/dL (ref 0.3–1.2)
Total Protein: 6.3 g/dL — ABNORMAL LOW (ref 6.5–8.1)

## 2019-10-25 MED ORDER — LEUCOVORIN CALCIUM INJECTION 350 MG
900.0000 mg | Freq: Once | INTRAVENOUS | Status: AC
  Administered 2019-10-25: 900 mg via INTRAVENOUS
  Filled 2019-10-25: qty 20

## 2019-10-25 MED ORDER — SODIUM CHLORIDE 0.9 % IV SOLN
10.0000 mg | Freq: Once | INTRAVENOUS | Status: AC
  Administered 2019-10-25: 10 mg via INTRAVENOUS
  Filled 2019-10-25: qty 10

## 2019-10-25 MED ORDER — DEXTROSE 5 % IV SOLN
Freq: Once | INTRAVENOUS | Status: AC
  Filled 2019-10-25: qty 250

## 2019-10-25 MED ORDER — PALONOSETRON HCL INJECTION 0.25 MG/5ML
0.2500 mg | Freq: Once | INTRAVENOUS | Status: AC
  Administered 2019-10-25: 0.25 mg via INTRAVENOUS
  Filled 2019-10-25: qty 5

## 2019-10-25 MED ORDER — SODIUM CHLORIDE 0.9 % IV SOLN
2400.0000 mg/m2 | INTRAVENOUS | Status: DC
  Administered 2019-10-25: 5400 mg via INTRAVENOUS
  Filled 2019-10-25: qty 108

## 2019-10-25 MED ORDER — SODIUM CHLORIDE 0.9% FLUSH
10.0000 mL | Freq: Once | INTRAVENOUS | Status: AC
  Administered 2019-10-25: 10 mL via INTRAVENOUS
  Filled 2019-10-25: qty 10

## 2019-10-25 MED ORDER — OXALIPLATIN CHEMO INJECTION 100 MG/20ML
80.0000 mg/m2 | Freq: Once | INTRAVENOUS | Status: AC
  Administered 2019-10-25: 180 mg via INTRAVENOUS
  Filled 2019-10-25: qty 36

## 2019-10-25 NOTE — Progress Notes (Signed)
Hematology/Oncology Follow Up Note [facility]  [phone] [phone]  Patient Care Team: [name], MD as PCP - General (Family Medicine) Borders, Kirt Boys, NP as Nurse Practitioner (Hospice and Palliative Medicine) [name], MD as Consulting Physician (Oncology) [name], MD as Referring Physician (Radiation Oncology) [name], NP as Nurse Practitioner   Name of the patient: Christopher Delacruz  [id number]  February 24, 1950   REASON FOR VISIT  follow-up for rectal cancer  PERTINENT ONCOLOGY HISTORY Rc Amison is a 69 y.o.[place] who has above oncology history reviewed by me today presented for follow up visit for management of locally advanced rectal cancer. -Initially presented to emergency room due to profound anemia. 09/11/2019 EGD showed duodenal erosions without bleeding 09/11/2019 colonoscopy preparation was poor.  Likely malignant partially obstructing tumor in the rectosigmoid colon area which was biopsied. 09/13/2019, repeat colonoscopy showed 12 mm polyps were found in the descending colon.  Polyp was sessile.  Polyp was resected and retrieved.  A polypoid ulcerated partially obstructing large mass was found from 10 to 20 cm proximal to the anus.  The mass was circumferential, measured 10 cm in length.  In addition the diameter measures 12 mm.  Oozing was present 09/11/2019, biopsy showed invasive colorectal adenocarcinoma.  Moderately differentiated. 09/13/2019, CT chest abdomen pelvis showed rectal wall mass with evidence of local invasion to the mesorectal fat and potential involvement of the right seminal vesicle.  Multiple enlarged lymph nodes.  Small lung nodules 2 to 3 mm in the lung bilaterally.  Highly nonspecific.  Cannot rule out metastasis.  09/14/2019 MRI pelvis showed T4b N2 rectal cancer. 09/15/2019 MRI brain with and without contrast showed punctate focus of restricted diffusion most likely reflects acute or  early subacute small vessel infarct.  No evidence of intracranial metastasis.  Multiple chronic infarct.  #Iron deficiency anemia, patient received PRBC transfusion and IV Venofer treatments. #Vitamin B12 deficiency, status post parenteral vitamin B12 injections during hospitalization.  Currently on vitamin B12 supplementation. #Patient was found to have very poor insight to his condition.  Was seen by psychiatrist and was deemed incompetent.  Patient's medical power of attorney is his nephew Christopher Delacruz.  #Mediport was placed by Dr. Dahlia Byes  # #H. pylori gastritis, patient finishes course of antibiotics and PPI  INTERVA L HISTORY 69 y.o. patient presents for follow-up of rectal cancer.Marland Kitchen  He was accompanied by his nephew Christopher Delacruz.  Christopher Delacruz is also his power of attorney. 09/26/2019. Status post 2 cycles of FOLFOX.  5-FU bolus was omitted due to cytopenia. Patient appears to tolerate very well. Denies any additional episodes of blood in the bowel movement. Denies any difficulties passing bowel movement. Continues to have memory issues.  He is on vitamin B12 supplementation.    Review of Systems  Unable to perform ROS: Other (Cognitive impairment)  Constitutional: Positive for fatigue.  Respiratory: Negative for shortness of breath.   Gastrointestinal: Negative for abdominal pain, blood in stool and constipation.      No Known Allergies   Past Medical History:  Diagnosis Date  . B12 deficiency 09/17/2019  . CVA (cerebral vascular accident) ([place])   . Diabetes mellitus ([place])   . Hyperlipidemia   . Hypertension      Past Surgical History:  Procedure Laterality Date  . COLONOSCOPY N/A 09/11/2019   Procedure: COLONOSCOPY;  Surgeon: [name], MD;  Location: [facility] ENDOSCOPY;  Service: Endoscopy;  Laterality: N/A;  . COLONOSCOPY WITH PROPOFOL N/A 09/12/2019   Procedure: COLONOSCOPY WITH  PROPOFOL;  Surgeon: [name], MD;  Location: [facility] ENDOSCOPY;  Service: Gastroenterology;  Laterality: N/A;    . COLONOSCOPY WITH PROPOFOL N/A 09/13/2019   Procedure: COLONOSCOPY WITH PROPOFOL;  Surgeon: [name], MD;  Location: [facility] ENDOSCOPY;  Service: Gastroenterology;  Laterality: N/A;  . ESOPHAGOGASTRODUODENOSCOPY N/A 09/11/2019   Procedure: ESOPHAGOGASTRODUODENOSCOPY (EGD);  Surgeon: [name], MD;  Location: [facility] ENDOSCOPY;  Service: Endoscopy;  Laterality: N/A;  . EXPLORE EYE SOCKET    . PORTACATH PLACEMENT N/A 09/15/2019   Procedure: INSERTION PORT-A-CATH;  Surgeon: Jules Husbands, MD;  Location: [facility] ORS;  Service: General;  Laterality: N/A;    Social History   Socioeconomic History  . Marital status: Divorced    Spouse name: Not on file  . Number of children: Not on file  . Years of education: Not on file  . Highest education level: Not on file  Occupational History  . Not on file  Tobacco Use  . Smoking status: Never Smoker  . Smokeless tobacco: Never Used  Vaping Use  . Vaping Use: Never used  Substance and Sexual Activity  . Alcohol use: Never    Alcohol/week: 0.0 standard drinks  . Drug use: Never  . Sexual activity: Not on file  Other Topics Concern  . Not on file  Social History Narrative  . Not on file   Social Determinants of Health   Financial Resource Strain:   . Difficulty of Paying Living Expenses: Not on file  Food Insecurity:   . Worried About Charity fundraiser in the Last Year: Not on file  . Ran Out of Food in the Last Year: Not on file  Transportation Needs: Unmet Transportation Needs  . Lack of Transportation (Medical): Yes  . Lack of Transportation (Non-Medical): Yes  Physical Activity:   . Days of Exercise per Week: Not on file  . Minutes of Exercise per Session: Not on file  Stress:   . Feeling of Stress : Not on file  Social Connections:   . Frequency of Communication with Friends and Family: Not on file  . Frequency of Social Gatherings with Friends and Family: Not on file  . Attends Religious Services: Not on file  . Active Member  of Clubs or Organizations: Not on file  . Attends Archivist Meetings: Not on file  . Marital Status: Not on file  Intimate Partner Violence:   . Fear of Current or Ex-Partner: Not on file  . Emotionally Abused: Not on file  . Physically Abused: Not on file  . Sexually Abused: Not on file    Family History  Problem Relation Age of Onset  . Heart attack Sister   . Leukemia Brother   . Stroke Brother      Current Outpatient Medications:  .  amLODipine (NORVASC) 10 MG tablet, Take 1 tablet (10 mg total) by mouth daily., Disp: 30 tablet, Rfl: 1 .  aspirin EC 81 MG EC tablet, Take 1 tablet (81 mg total) by mouth daily. Swallow whole., Disp: 30 tablet, Rfl: 11 .  clopidogrel (PLAVIX) 75 MG tablet, Take 1 tablet (75 mg total) by mouth daily., Disp: 30 tablet, Rfl: 1 .  ferrous sulfate 325 (65 FE) MG tablet, Take 1 tablet (325 mg total) by mouth daily., Disp: 30 tablet, Rfl: 3 .  lidocaine-prilocaine (EMLA) cream, Apply to affected area once (Patient not taking: Reported on 09/27/2019), Disp: 30 g, Rfl: 3 .  ondansetron (ZOFRAN) 8 MG tablet, Take 1 tablet (8 mg total)  by mouth 2 (two) times daily as needed for refractory nausea / vomiting. Start on day 3 after chemotherapy., Disp: 30 tablet, Rfl: 1 .  pantoprazole (PROTONIX) 40 MG tablet, Take 1 tablet (40 mg total) by mouth 2 (two) times daily before a meal for 10 days., Disp: 20 tablet, Rfl: 0 .  prochlorperazine (COMPAZINE) 10 MG tablet, Take 1 tablet (10 mg total) by mouth every 6 (six) hours as needed (Nausea or vomiting)., Disp: 30 tablet, Rfl: 1 .  tamsulosin (FLOMAX) 0.4 MG CAPS capsule, Take 1 capsule (0.4 mg total) by mouth daily., Disp: 30 capsule, Rfl: 11  Physical exam:  Vitals:   10/25/19 0855  BP: (!) 152/71  Pulse: 60  Resp: 18  Temp: (!) 96.7 F (35.9 C)  Weight: 225 lb 6.4 oz (102.2 kg)   Physical Exam Constitutional:      General: He is not in acute distress.    Comments: Patient sits in the wheelchair.    HENT:     Head: Normocephalic and atraumatic.  Eyes:     General: No scleral icterus. Cardiovascular:     Rate and Rhythm: Normal rate and regular rhythm.     Heart sounds: Normal heart sounds.  Pulmonary:     Effort: Pulmonary effort is normal. No respiratory distress.     Breath sounds: No wheezing.  Abdominal:     General: Bowel sounds are normal. There is no distension.     Palpations: Abdomen is soft.  Musculoskeletal:        General: No deformity. Normal range of motion.     Cervical back: Normal range of motion and neck supple.  Skin:    General: Skin is warm and dry.     Findings: No erythema or rash.  Neurological:     Mental Status: He is alert. Mental status is at baseline.     Cranial Nerves: No cranial nerve deficit.     Coordination: Coordination normal.     Comments: Orientated x2  Psychiatric:        Mood and Affect: Mood normal.     CMP Latest Ref Rng & Units 10/11/2019  Glucose 70 - 99 mg/dL 97  BUN 8 - 23 mg/dL 16  Creatinine 0.61 - 1.24 mg/dL 1.49(H)  Sodium 135 - 145 mmol/L 142  Potassium 3.5 - 5.1 mmol/L 4.0  Chloride 98 - 111 mmol/L 109  CO2 22 - 32 mmol/L 26  Calcium 8.9 - 10.3 mg/dL 8.7(L)  Total Protein 6.5 - 8.1 g/dL 6.7  Total Bilirubin 0.3 - 1.2 mg/dL 0.7  Alkaline Phos 38 - 126 U/L 67  AST 15 - 41 U/L 17  ALT 0 - 44 U/L 19   CBC Latest Ref Rng & Units 10/11/2019  WBC 4.0 - 10.5 K/uL 7.5  Hemoglobin 13.0 - 17.0 g/dL 9.9(L)  Hematocrit 39 - 52 % 31.7(L)  Platelets 150 - 400 K/uL 245    RADIOGRAPHIC STUDIES: I have personally reviewed the radiological images as listed and agreed with the findings in the report. US Venous Img Upper Uni Right  Result Date: 09/26/2019 CLINICAL DATA:  Right upper extremity edema. EXAM: RIGHT UPPER EXTREMITY VENOUS DOPPLER ULTRASOUND TECHNIQUE: Gray-scale sonography with graded compression, as well as color Doppler and duplex ultrasound were performed to evaluate the upper extremity deep venous system from  the level of the subclavian vein and including the jugular, axillary, basilic, radial, ulnar and upper cephalic vein. Spectral Doppler was utilized to evaluate flow at rest and with  distal augmentation maneuvers. COMPARISON:  None. FINDINGS: Contralateral Subclavian Vein: Respiratory phasicity is normal and symmetric with the symptomatic side. No evidence of thrombus. Normal compressibility. Internal Jugular Vein: No evidence of thrombus. Normal compressibility, respiratory phasicity and response to augmentation. Subclavian Vein: No evidence of thrombus. Normal compressibility, respiratory phasicity and response to augmentation. Axillary Vein: No evidence of thrombus. Normal compressibility, respiratory phasicity and response to augmentation. Cephalic Vein: Thrombus is identified in the right cephalic vein beginning just above the wrist and continuing to just above the antecubital fossa. Basilic Vein: No evidence of thrombus. Normal compressibility, respiratory phasicity and response to augmentation. Brachial Veins: No evidence of thrombus. Normal compressibility, respiratory phasicity and response to augmentation. Radial Veins: No evidence of thrombus. Normal compressibility, respiratory phasicity and response to augmentation. Ulnar Veins: No evidence of thrombus. Normal compressibility, respiratory phasicity and response to augmentation. Venous Reflux:  None visualized. Other Findings:  No abnormal fluid collections identified. IMPRESSION: 1. No evidence deep vein thrombosis in the right upper extremity. 2. Positive for superficial thrombophlebitis of the cephalic vein from just above the wrist to just proximal to the antecubital fossa. Electronically Signed   By: Aletta Edouard M.D.   On: 09/26/2019 16:00     Assessment and plan 1. Encounter for antineoplastic chemotherapy   2. Rectal cancer ([place])   3. Iron deficiency anemia due to chronic blood loss   4. B12 deficiency   Cancer Staging Rectal cancer  [facility]) Staging form: Colon and Rectum, AJCC 8th Edition - Clinical stage from 09/22/2019: Stage IIIC (cT4b, cN2, cM0) - Signed by [name], MD on 09/22/2019  Stage IIIC rectal cancer cT4 N2 M0 rectal cancer Status post 2 cycles FOLFOX with 5-FU bolus omitted Labs are reviewed and discussed with patient Proceed with cycle 3 FOLFOX-no 5-FU bolus.  #Iron deficiency anemia, Hemoglobin has continued to improve.  Hemoglobin has improved to 10.8.  Continue oral iron supplementation.   Continue monitor. Patient has Feosol as well as ferrous sulfate listed on his medication list.  I have asked RN to call nursing home facility to clarify.  Patient only need to be one iron supplementation medication daily.  #Vitamin B12 deficiency, B12 level has improved.  Continue vitamin B12 supplementation.   #Right upper extremity superficial thrombophlebitis.  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  Vitamin B12 deficiency may contribute to that and is being treated for B12 deficiency. We spent sufficient time to discuss many aspect of care, questions were answered to patient's satisfaction. Patient will follow up in 2 weeks evaluation prior to next cycle of treatment. [name], MD, PhD Hematology Oncology [facility] Pager- [phone] 10/25/2019

## 2019-10-25 NOTE — Progress Notes (Signed)
Pt here with nephew for follow up and treatment. Pt is now at the [facility] and did not bring medication list with him. Contacted the [place] and updated MAR with facility nurse.

## 2019-10-26 LAB — CEA: CEA: 57.2 ng/mL — ABNORMAL HIGH (ref 0.0–4.7)

## 2019-10-27 ENCOUNTER — Inpatient Hospital Stay: Payer: Medicare Other

## 2019-10-27 ENCOUNTER — Other Ambulatory Visit: Payer: Self-pay

## 2019-10-27 DIAGNOSIS — Z5111 Encounter for antineoplastic chemotherapy: Secondary | ICD-10-CM | POA: Diagnosis not present

## 2019-10-27 DIAGNOSIS — C20 Malignant neoplasm of rectum: Secondary | ICD-10-CM

## 2019-10-27 MED ORDER — HEPARIN SOD (PORK) LOCK FLUSH 100 UNIT/ML IV SOLN
500.0000 [IU] | Freq: Once | INTRAVENOUS | Status: AC | PRN
  Administered 2019-10-27: 500 [IU]
  Filled 2019-10-27: qty 5

## 2019-10-27 MED ORDER — SODIUM CHLORIDE 0.9% FLUSH
10.0000 mL | INTRAVENOUS | Status: DC | PRN
  Administered 2019-10-27: 10 mL
  Filled 2019-10-27: qty 10

## 2019-11-02 ENCOUNTER — Other Ambulatory Visit: Payer: Self-pay

## 2019-11-02 ENCOUNTER — Non-Acute Institutional Stay: Payer: Medicare Other | Admitting: Adult Health Nurse Practitioner

## 2019-11-02 DIAGNOSIS — D490 Neoplasm of unspecified behavior of digestive system: Secondary | ICD-10-CM

## 2019-11-02 DIAGNOSIS — Z515 Encounter for palliative care: Secondary | ICD-10-CM

## 2019-11-02 NOTE — Progress Notes (Signed)
[facility] Consult Note Telephone: [phone]  Fax: [phone]  PATIENT NAME: Christopher Delacruz DOB: 1950-10-28 MRN: [id number]  PRIMARY CARE PROVIDER:   [name], MD  REFERRING PROVIDER:  Thea Gist NP [facility])  RESPONSIBLE PARTY:   Riddik Senna, nephew/HCPOA [phone]    RECOMMENDATIONS and PLAN:  1.  Advanced care planning.  Patient is full code.  Spoke with patient and nephew and they want to have anything done to prolong life.  Nephew did state that they may change this based on the trajectory of his cancer and treatment.  Encouraged to call if they wanted to make changes so we could set up appointment and discuss and fill out any paperwork that may need to be changed.  2.  Functional status.  Patient is able to ambulate without assistive devices.  He does requiring cueing for ADLs but is fairly independent.  He had been living independently prior to hospitalization.  Nephew does state that family did have concerns with him taking his meds as he should, paying bills, keeping appointments,etc when he was living alone.  It is noted in Epic that he was seen by psychiatry and he is deemed not to have the capacity to make decisions for himself.  Nephew is HCPOA.  Nephew states that his driving license has been suspended.  Continue supportive care at facility  3.  Support.  Nephew does state that his uncle still wants to go back to his independent living home.  This will be an ongoing conversation with the patient as gets used to his new environment.    Palliative will continue to monitor for symptom management/decline and make recommendations as needed.  Will follow up in 6-8 weeks.    I spent 30 minutes providing this consultation,  from 10:30 to 11:00 including time spent with patient/family, chart review, provider coordination, documentation. More than 50% of the time in this consultation was spent coordinating communication.    HISTORY OF PRESENT ILLNESS:  Christopher Delacruz is a 69 y.o. year old male with multiple medical problems including colorectal cancer, CKD stage 3, anemia,vascular dementia, h/o CVA with right side weakness. Palliative Care was asked to help address goals of care. Patient was in hospital 8/11-8/21/21 for rectal bleeding.  Was found to have large colorectal mass. Patient was at [facility] following hospitalization and is now at [facility]. Patient being followed by oncology.  He is currently undergoing chemo.  He has had 3 out of 8 treatments of his chemo. Then he will undergo radiation therapy.  Surgery will be considered following chemo/radiation.  Patient has no complaints.  Denies pain, headaches, dizziness, fever, sore throat, N/V/D, constipation, hematuria,dysuria.  Staff have no concerns reported today.    CODE STATUS: full code  PPS: 50% HOSPICE ELIGIBILITY/DIAGNOSIS: TBD  PHYSICAL EXAM:  HR 54  O2 95% on RA General: Patient lying in bed in NAD Cardiovascular: regular rate and rhythm Pulmonary: lung sounds clear; normal respiratory effort Abdomen: soft, nontender, + bowel sounds GU: no suprapubic tenderness Extremities: no edema, no joint deformities Skin: no rashes on exposed skin Neurological: Weakness; A&O to person and place   PAST MEDICAL HISTORY:  Past Medical History:  Diagnosis Date  . B12 deficiency 09/17/2019  . CVA (cerebral vascular accident) ([place])   . Diabetes mellitus ([place])   . Hyperlipidemia   . Hypertension     SOCIAL HX:  Social History   Tobacco Use  . Smoking status: Never Smoker  .  Smokeless tobacco: Never Used  Substance Use Topics  . Alcohol use: Never    Alcohol/week: 0.0 standard drinks    ALLERGIES: No Known Allergies   PERTINENT MEDICATIONS:  Outpatient Encounter Medications as of 11/02/2019  Medication Sig  . amLODipine (NORVASC) 10 MG tablet Take 1 tablet (10 mg total) by mouth daily.  Marland Kitchen aspirin EC 81 MG EC tablet Take 1 tablet (81 mg  total) by mouth daily. Swallow whole.  . clopidogrel (PLAVIX) 75 MG tablet Take 1 tablet (75 mg total) by mouth daily.  . FEOSOL 200 (65 Fe) MG TABS Take 1 tablet by mouth daily. (Patient not taking: Reported on 10/25/2019)  . ferrous sulfate 325 (65 FE) MG tablet Take 1 tablet (325 mg total) by mouth daily.  . finasteride (PROSCAR) 5 MG tablet Take 5 mg by mouth daily.  Marland Kitchen lidocaine-prilocaine (EMLA) cream Apply to affected area once  . multivitamin (ONE-A-DAY MEN'S) TABS tablet Take 1 tablet by mouth daily.  . ondansetron (ZOFRAN) 8 MG tablet Take 1 tablet (8 mg total) by mouth 2 (two) times daily as needed for refractory nausea / vomiting. Start on day 3 after chemotherapy.  . prochlorperazine (COMPAZINE) 10 MG tablet Take 1 tablet (10 mg total) by mouth every 6 (six) hours as needed (Nausea or vomiting).  . tamsulosin (FLOMAX) 0.4 MG CAPS capsule Take 1 capsule (0.4 mg total) by mouth daily. (Patient not taking: Reported on 10/25/2019)  . vitamin B-12 (CYANOCOBALAMIN) 1000 MCG tablet Take 1,000 mcg by mouth daily.   No facility-administered encounter medications on file as of 11/02/2019.     Jude Linck Jenetta Downer, NP

## 2019-11-08 ENCOUNTER — Inpatient Hospital Stay: Payer: Medicare Other | Attending: Oncology

## 2019-11-08 ENCOUNTER — Other Ambulatory Visit: Payer: Self-pay

## 2019-11-08 ENCOUNTER — Inpatient Hospital Stay: Payer: Medicare Other

## 2019-11-08 ENCOUNTER — Encounter: Payer: Self-pay | Admitting: Oncology

## 2019-11-08 ENCOUNTER — Inpatient Hospital Stay (HOSPITAL_BASED_OUTPATIENT_CLINIC_OR_DEPARTMENT_OTHER): Payer: Medicare Other | Admitting: Oncology

## 2019-11-08 VITALS — BP 154/79 | HR 54 | Temp 96.6°F | Resp 18 | Wt 222.2 lb

## 2019-11-08 DIAGNOSIS — E538 Deficiency of other specified B group vitamins: Secondary | ICD-10-CM | POA: Diagnosis not present

## 2019-11-08 DIAGNOSIS — C20 Malignant neoplasm of rectum: Secondary | ICD-10-CM | POA: Insufficient documentation

## 2019-11-08 DIAGNOSIS — Z5111 Encounter for antineoplastic chemotherapy: Secondary | ICD-10-CM | POA: Diagnosis not present

## 2019-11-08 DIAGNOSIS — D509 Iron deficiency anemia, unspecified: Secondary | ICD-10-CM | POA: Insufficient documentation

## 2019-11-08 DIAGNOSIS — Z806 Family history of leukemia: Secondary | ICD-10-CM | POA: Insufficient documentation

## 2019-11-08 DIAGNOSIS — D500 Iron deficiency anemia secondary to blood loss (chronic): Secondary | ICD-10-CM | POA: Diagnosis not present

## 2019-11-08 DIAGNOSIS — G3184 Mild cognitive impairment, so stated: Secondary | ICD-10-CM | POA: Diagnosis not present

## 2019-11-08 DIAGNOSIS — R599 Enlarged lymph nodes, unspecified: Secondary | ICD-10-CM | POA: Insufficient documentation

## 2019-11-08 DIAGNOSIS — Z8249 Family history of ischemic heart disease and other diseases of the circulatory system: Secondary | ICD-10-CM | POA: Diagnosis not present

## 2019-11-08 DIAGNOSIS — Z79899 Other long term (current) drug therapy: Secondary | ICD-10-CM | POA: Insufficient documentation

## 2019-11-08 DIAGNOSIS — Z823 Family history of stroke: Secondary | ICD-10-CM | POA: Insufficient documentation

## 2019-11-08 LAB — COMPREHENSIVE METABOLIC PANEL
ALT: 13 U/L (ref 0–44)
AST: 19 U/L (ref 15–41)
Albumin: 3.4 g/dL — ABNORMAL LOW (ref 3.5–5.0)
Alkaline Phosphatase: 67 U/L (ref 38–126)
Anion gap: 7 (ref 5–15)
BUN: 18 mg/dL (ref 8–23)
CO2: 27 mmol/L (ref 22–32)
Calcium: 8.7 mg/dL — ABNORMAL LOW (ref 8.9–10.3)
Chloride: 108 mmol/L (ref 98–111)
Creatinine, Ser: 1.59 mg/dL — ABNORMAL HIGH (ref 0.61–1.24)
GFR, Estimated: 43 mL/min — ABNORMAL LOW (ref >60)
Glucose, Bld: 137 mg/dL — ABNORMAL HIGH (ref 70–99)
Potassium: 4 mmol/L (ref 3.5–5.1)
Sodium: 142 mmol/L (ref 135–145)
Total Bilirubin: 0.6 mg/dL (ref 0.3–1.2)
Total Protein: 6.6 g/dL (ref 6.5–8.1)

## 2019-11-08 LAB — CBC WITH DIFFERENTIAL/PLATELET
Abs Immature Granulocytes: 0.02 10*3/uL (ref 0.00–0.07)
Basophils Absolute: 0.1 10*3/uL (ref 0.0–0.1)
Basophils Relative: 1 %
Eosinophils Absolute: 0.1 10*3/uL (ref 0.0–0.5)
Eosinophils Relative: 2 %
HCT: 36.4 % — ABNORMAL LOW (ref 39.0–52.0)
Hemoglobin: 11.6 g/dL — ABNORMAL LOW (ref 13.0–17.0)
Immature Granulocytes: 0 %
Lymphocytes Relative: 22 %
Lymphs Abs: 1.8 10*3/uL (ref 0.7–4.0)
MCH: 26.5 pg (ref 26.0–34.0)
MCHC: 31.9 g/dL (ref 30.0–36.0)
MCV: 83.3 fL (ref 80.0–100.0)
Monocytes Absolute: 0.9 10*3/uL (ref 0.1–1.0)
Monocytes Relative: 10 %
Neutro Abs: 5.6 10*3/uL (ref 1.7–7.7)
Neutrophils Relative %: 65 %
Platelets: 251 10*3/uL (ref 150–400)
RBC: 4.37 MIL/uL (ref 4.22–5.81)
RDW: 21.5 % — ABNORMAL HIGH (ref 11.5–15.5)
WBC: 8.5 10*3/uL (ref 4.0–10.5)
nRBC: 0 % (ref 0.0–0.2)

## 2019-11-08 MED ORDER — OXALIPLATIN CHEMO INJECTION 100 MG/20ML
80.0000 mg/m2 | Freq: Once | INTRAVENOUS | Status: AC
  Administered 2019-11-08: 180 mg via INTRAVENOUS
  Filled 2019-11-08: qty 36

## 2019-11-08 MED ORDER — SODIUM CHLORIDE 0.9 % IV SOLN
2400.0000 mg/m2 | INTRAVENOUS | Status: DC
  Administered 2019-11-08: 5400 mg via INTRAVENOUS
  Filled 2019-11-08: qty 108

## 2019-11-08 MED ORDER — DEXTROSE 5 % IV SOLN
Freq: Once | INTRAVENOUS | Status: AC
  Filled 2019-11-08: qty 250

## 2019-11-08 MED ORDER — SODIUM CHLORIDE 0.9 % IV SOLN
10.0000 mg | Freq: Once | INTRAVENOUS | Status: AC
  Administered 2019-11-08: 10 mg via INTRAVENOUS
  Filled 2019-11-08: qty 10

## 2019-11-08 MED ORDER — LEUCOVORIN CALCIUM INJECTION 350 MG
900.0000 mg | Freq: Once | INTRAVENOUS | Status: AC
  Administered 2019-11-08: 900 mg via INTRAVENOUS
  Filled 2019-11-08: qty 45

## 2019-11-08 MED ORDER — PALONOSETRON HCL INJECTION 0.25 MG/5ML
0.2500 mg | Freq: Once | INTRAVENOUS | Status: AC
  Administered 2019-11-08: 0.25 mg via INTRAVENOUS
  Filled 2019-11-08: qty 5

## 2019-11-08 MED ORDER — SODIUM CHLORIDE 0.9 % IV SOLN
Freq: Once | INTRAVENOUS | Status: AC
  Filled 2019-11-08: qty 250

## 2019-11-08 MED ORDER — SODIUM CHLORIDE 0.9% FLUSH
10.0000 mL | INTRAVENOUS | Status: DC | PRN
  Administered 2019-11-08: 10 mL via INTRAVENOUS
  Filled 2019-11-08: qty 10

## 2019-11-08 NOTE — Progress Notes (Signed)
2957: Per Dr. Tasia Catchings okay to proceed with treatment with B/P 154/79, and Creatinine 1.59.

## 2019-11-08 NOTE — Progress Notes (Signed)
Pt here for follow up. No new concerns voiced. Contacted facility and updated [facility]

## 2019-11-08 NOTE — Progress Notes (Signed)
Hematology/Oncology Follow Up Note [facility]  Telephone:([phone] [phone]  Patient Care Team: [name], MD as PCP - General (Family Medicine) Borders, Kirt Boys, NP as Nurse Practitioner (Hospice and Palliative Medicine) [name], MD as Consulting Physician (Oncology) [name], MD as Referring Physician (Radiation Oncology) [name], NP as Nurse Practitioner   Name of the patient: Christopher Delacruz  [id number]  Feb 26, 1950   REASON FOR VISIT  follow-up for rectal cancer  PERTINENT ONCOLOGY HISTORY Christopher Delacruz is a 69 y.o.[place] who has above oncology history reviewed by me today presented for follow up visit for management of locally advanced rectal cancer. -Initially presented to emergency room due to profound anemia. 09/11/2019 EGD showed duodenal erosions without bleeding 09/11/2019 colonoscopy preparation was poor.  Likely malignant partially obstructing tumor in the rectosigmoid colon area which was biopsied. 09/13/2019, repeat colonoscopy showed 12 mm polyps were found in the descending colon.  Polyp was sessile.  Polyp was resected and retrieved.  A polypoid ulcerated partially obstructing large mass was found from 10 to 20 cm proximal to the anus.  The mass was circumferential, measured 10 cm in length.  In addition the diameter measures 12 mm.  Oozing was present 09/11/2019, biopsy showed invasive colorectal adenocarcinoma.  Moderately differentiated. 09/13/2019, CT chest abdomen pelvis showed rectal wall mass with evidence of local invasion to the mesorectal fat and potential involvement of the right seminal vesicle.  Multiple enlarged lymph nodes.  Small lung nodules 2 to 3 mm in the lung bilaterally.  Highly nonspecific.  Cannot rule out metastasis.  09/14/2019 MRI pelvis showed T4b N2 rectal cancer. 09/15/2019 MRI brain with and without contrast showed punctate focus of restricted diffusion most likely reflects acute or  early subacute small vessel infarct.  No evidence of intracranial metastasis.  Multiple chronic infarct.  #Iron deficiency anemia, patient received PRBC transfusion and IV Venofer treatments. #Vitamin B12 deficiency, status post parenteral vitamin B12 injections during hospitalization.  Currently on vitamin B12 supplementation. #Patient was found to have very poor insight to his condition.  Was seen by psychiatrist and was deemed incompetent.  Patient's medical power of attorney is his nephew Christopher Delacruz.  #Mediport was placed by Dr. Dahlia Byes  # #H. pylori gastritis, patient finishes course of antibiotics and PPI # 09/26/2019 started on chemotherapy with FOLFOX.  INTERVA L HISTORY 69 y.o. patient presents for follow-up of rectal cancer.Marland Kitchen  He was accompanied by his nephew Christopher Delacruz.  Christopher Delacruz is also his power of attorney. Patient reports feeling well.  No report of constipation or diarrhea, nausea vomiting, fever or chills. Fatigue level has improved.    Review of Systems  Unable to perform ROS: Other (Cognitive impairment)  Constitutional: Negative for fatigue.  Respiratory: Negative for shortness of breath.   Gastrointestinal: Negative for abdominal pain, blood in stool and constipation.      No Known Allergies   Past Medical History:  Diagnosis Date  . B12 deficiency 09/17/2019  . CVA (cerebral vascular accident) ([place])   . Diabetes mellitus ([place])   . Hyperlipidemia   . Hypertension      Past Surgical History:  Procedure Laterality Date  . COLONOSCOPY N/A 09/11/2019   Procedure: COLONOSCOPY;  Surgeon: [name], MD;  Location: [facility] ENDOSCOPY;  Service: Endoscopy;  Laterality: N/A;  . COLONOSCOPY WITH PROPOFOL N/A 09/12/2019   Procedure: COLONOSCOPY WITH PROPOFOL;  Surgeon: [name], MD;  Location: [facility] ENDOSCOPY;  Service: Gastroenterology;  Laterality: N/A;  . COLONOSCOPY WITH PROPOFOL N/A  09/13/2019   Procedure: COLONOSCOPY WITH PROPOFOL;  Surgeon: [name], MD;  Location: [facility]  ENDOSCOPY;  Service: Gastroenterology;  Laterality: N/A;  . ESOPHAGOGASTRODUODENOSCOPY N/A 09/11/2019   Procedure: ESOPHAGOGASTRODUODENOSCOPY (EGD);  Surgeon: [name], MD;  Location: [facility] ENDOSCOPY;  Service: Endoscopy;  Laterality: N/A;  . EXPLORE EYE SOCKET    . PORTACATH PLACEMENT N/A 09/15/2019   Procedure: INSERTION PORT-A-CATH;  Surgeon: Jules Husbands, MD;  Location: [facility] ORS;  Service: General;  Laterality: N/A;    Social History   Socioeconomic History  . Marital status: Divorced    Spouse name: Not on file  . Number of children: Not on file  . Years of education: Not on file  . Highest education level: Not on file  Occupational History  . Not on file  Tobacco Use  . Smoking status: Never Smoker  . Smokeless tobacco: Never Used  Vaping Use  . Vaping Use: Never used  Substance and Sexual Activity  . Alcohol use: Never    Alcohol/week: 0.0 standard drinks  . Drug use: Never  . Sexual activity: Not on file  Other Topics Concern  . Not on file  Social History Narrative  . Not on file   Social Determinants of Health   Financial Resource Strain:   . Difficulty of Paying Living Expenses: Not on file  Food Insecurity:   . Worried About Charity fundraiser in the Last Year: Not on file  . Ran Out of Food in the Last Year: Not on file  Transportation Needs: Unmet Transportation Needs  . Lack of Transportation (Medical): Yes  . Lack of Transportation (Non-Medical): Yes  Physical Activity:   . Days of Exercise per Week: Not on file  . Minutes of Exercise per Session: Not on file  Stress:   . Feeling of Stress : Not on file  Social Connections:   . Frequency of Communication with Friends and Family: Not on file  . Frequency of Social Gatherings with Friends and Family: Not on file  . Attends Religious Services: Not on file  . Active Member of Clubs or Organizations: Not on file  . Attends Archivist Meetings: Not on file  . Marital Status: Not on file    Intimate Partner Violence:   . Fear of Current or Ex-Partner: Not on file  . Emotionally Abused: Not on file  . Physically Abused: Not on file  . Sexually Abused: Not on file    Family History  Problem Relation Age of Onset  . Heart attack Sister   . Leukemia Brother   . Stroke Brother      Current Outpatient Medications:  .  amLODipine (NORVASC) 10 MG tablet, Take 1 tablet (10 mg total) by mouth daily., Disp: 30 tablet, Rfl: 1 .  aspirin EC 81 MG EC tablet, Take 1 tablet (81 mg total) by mouth daily. Swallow whole., Disp: 30 tablet, Rfl: 11 .  clopidogrel (PLAVIX) 75 MG tablet, Take 1 tablet (75 mg total) by mouth daily., Disp: 30 tablet, Rfl: 1 .  ferrous sulfate 325 (65 FE) MG tablet, Take 1 tablet (325 mg total) by mouth daily., Disp: 30 tablet, Rfl: 3 .  finasteride (PROSCAR) 5 MG tablet, Take 5 mg by mouth daily., Disp: , Rfl:  .  lidocaine-prilocaine (EMLA) cream, Apply to affected area once, Disp: 30 g, Rfl: 3 .  multivitamin (ONE-A-DAY MEN'S) TABS tablet, Take 1 tablet by mouth daily., Disp: , Rfl:  .  ondansetron (ZOFRAN) 8 MG tablet,  Take 1 tablet (8 mg total) by mouth 2 (two) times daily as needed for refractory nausea / vomiting. Start on day 3 after chemotherapy., Disp: 30 tablet, Rfl: 1 .  prochlorperazine (COMPAZINE) 10 MG tablet, Take 1 tablet (10 mg total) by mouth every 6 (six) hours as needed (Nausea or vomiting)., Disp: 30 tablet, Rfl: 1 .  vitamin B-12 (CYANOCOBALAMIN) 1000 MCG tablet, Take 1,000 mcg by mouth daily., Disp: , Rfl:  No current facility-administered medications for this visit.  Facility-Administered Medications Ordered in Other Visits:  .  fluorouracil (ADRUCIL) 5,400 mg in sodium chloride 0.9 % 142 mL chemo infusion, 2,400 mg/m2 (Treatment Plan Recorded), Intravenous, 1 day or 1 dose, [name], MD .  leucovorin 900 mg in dextrose 5 % 250 mL infusion, 900 mg, Intravenous, Once, [name], MD, Last Rate: 148 mL/hr at 11/08/19 1139, 900 mg at 11/08/19  1139 .  oxaliplatin (ELOXATIN) 180 mg in dextrose 5 % 500 mL chemo infusion, 80 mg/m2 (Treatment Plan Recorded), Intravenous, Once, [name], MD, Last Rate: 268 mL/hr at 11/08/19 1138, 180 mg at 11/08/19 1138 .  sodium chloride flush (NS) 0.9 % injection 10 mL, 10 mL, Intravenous, PRN, [name], MD, 10 mL at 11/08/19 0830  Physical exam:  Vitals:   11/08/19 0857  BP: (!) 154/79  Pulse: (!) 54  Resp: 18  Temp: (!) 96.6 F (35.9 C)  Weight: 222 lb 3.2 oz (100.8 kg)   Physical Exam Constitutional:      General: He is not in acute distress.    Comments: Patient sits in the wheelchair.  HENT:     Head: Normocephalic and atraumatic.  Eyes:     General: No scleral icterus. Cardiovascular:     Rate and Rhythm: Normal rate and regular rhythm.     Heart sounds: Normal heart sounds.  Pulmonary:     Effort: Pulmonary effort is normal. No respiratory distress.     Breath sounds: No wheezing.  Abdominal:     General: Bowel sounds are normal. There is no distension.     Palpations: Abdomen is soft.  Musculoskeletal:        General: No deformity. Normal range of motion.     Cervical back: Normal range of motion and neck supple.  Skin:    General: Skin is warm and dry.     Findings: No erythema or rash.  Neurological:     Mental Status: He is alert. Mental status is at baseline.     Cranial Nerves: No cranial nerve deficit.     Coordination: Coordination normal.     Comments: Orientated x2  Psychiatric:        Mood and Affect: Mood normal.     CMP Latest Ref Rng & Units 11/08/2019  Glucose 70 - 99 mg/dL 137(H)  BUN 8 - 23 mg/dL 18  Creatinine 0.61 - 1.24 mg/dL 1.59(H)  Sodium 135 - 145 mmol/L 142  Potassium 3.5 - 5.1 mmol/L 4.0  Chloride 98 - 111 mmol/L 108  CO2 22 - 32 mmol/L 27  Calcium 8.9 - 10.3 mg/dL 8.7(L)  Total Protein 6.5 - 8.1 g/dL 6.6  Total Bilirubin 0.3 - 1.2 mg/dL 0.6  Alkaline Phos 38 - 126 U/L 67  AST 15 - 41 U/L 19  ALT 0 - 44 U/L 13   CBC Latest Ref Rng  & Units 11/08/2019  WBC 4.0 - 10.5 K/uL 8.5  Hemoglobin 13.0 - 17.0 g/dL 11.6(L)  Hematocrit 39 - 52 % 36.4(L)  Platelets 150 - 400 K/uL 251    RADIOGRAPHIC STUDIES: I have personally reviewed the radiological images as listed and agreed with the findings in the report. No results found.   Assessment and plan 1. Rectal cancer ([place])   2. Iron deficiency anemia due to chronic blood loss   3. Encounter for antineoplastic chemotherapy   4. B12 deficiency   Cancer Staging Rectal cancer [facility]) Staging form: Colon and Rectum, AJCC 8th Edition - Clinical stage from 09/22/2019: Stage IIIC (cT4b, cN2, cM0) - Signed by [name], MD on 09/22/2019  Stage IIIC rectal cancer cT4 N2 M0 rectal cancer Status post 2 cycles FOLFOX with 5-FU bolus omitted Labs reviewed and discussed with patient. Proceed with cycle 3 FOLFOX-no 5-FU bolus  #Iron deficiency anemia, Hemoglobin continues to improve.  Hemoglobin is 11.6 today. Continue oral iron supplementation.   #Vitamin B12 deficiency, B12 level has improved.  Continue vitamin B12 supplementation.   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  Vitamin B12 deficiency may contribute to that and is being treated for B12 deficiency. We spent sufficient time to discuss many aspect of care, questions were answered to patient's satisfaction. Patient will follow up in 2 weeks evaluation prior to next cycle of treatment. [name], MD, PhD Hematology Oncology [facility] Pager- [phone] 11/08/2019

## 2019-11-10 ENCOUNTER — Other Ambulatory Visit: Payer: Self-pay

## 2019-11-10 ENCOUNTER — Inpatient Hospital Stay: Payer: Medicare Other | Admitting: Hospice and Palliative Medicine

## 2019-11-10 ENCOUNTER — Inpatient Hospital Stay: Payer: Medicare Other

## 2019-11-10 VITALS — BP 153/76 | HR 58 | Temp 96.4°F | Resp 20

## 2019-11-10 DIAGNOSIS — C20 Malignant neoplasm of rectum: Secondary | ICD-10-CM

## 2019-11-10 DIAGNOSIS — Z5111 Encounter for antineoplastic chemotherapy: Secondary | ICD-10-CM | POA: Diagnosis not present

## 2019-11-10 MED ORDER — HEPARIN SOD (PORK) LOCK FLUSH 100 UNIT/ML IV SOLN
500.0000 [IU] | Freq: Once | INTRAVENOUS | Status: AC | PRN
  Administered 2019-11-10: 500 [IU]
  Filled 2019-11-10: qty 5

## 2019-11-10 MED ORDER — HEPARIN SOD (PORK) LOCK FLUSH 100 UNIT/ML IV SOLN
INTRAVENOUS | Status: AC
  Filled 2019-11-10: qty 5

## 2019-11-10 MED ORDER — SODIUM CHLORIDE 0.9% FLUSH
10.0000 mL | INTRAVENOUS | Status: DC | PRN
  Administered 2019-11-10: 10 mL
  Filled 2019-11-10: qty 10

## 2019-11-22 ENCOUNTER — Inpatient Hospital Stay: Payer: Medicare Other

## 2019-11-22 ENCOUNTER — Encounter: Payer: Self-pay | Admitting: Oncology

## 2019-11-22 ENCOUNTER — Inpatient Hospital Stay (HOSPITAL_BASED_OUTPATIENT_CLINIC_OR_DEPARTMENT_OTHER): Payer: Medicare Other | Admitting: Oncology

## 2019-11-22 ENCOUNTER — Other Ambulatory Visit: Payer: Self-pay

## 2019-11-22 VITALS — BP 153/90 | HR 57 | Temp 97.3°F | Resp 18 | Wt 221.2 lb

## 2019-11-22 DIAGNOSIS — C20 Malignant neoplasm of rectum: Secondary | ICD-10-CM

## 2019-11-22 DIAGNOSIS — D500 Iron deficiency anemia secondary to blood loss (chronic): Secondary | ICD-10-CM

## 2019-11-22 DIAGNOSIS — R4189 Other symptoms and signs involving cognitive functions and awareness: Secondary | ICD-10-CM

## 2019-11-22 DIAGNOSIS — E538 Deficiency of other specified B group vitamins: Secondary | ICD-10-CM

## 2019-11-22 DIAGNOSIS — Z5111 Encounter for antineoplastic chemotherapy: Secondary | ICD-10-CM | POA: Diagnosis not present

## 2019-11-22 LAB — CBC WITH DIFFERENTIAL/PLATELET
Abs Immature Granulocytes: 0.03 10*3/uL (ref 0.00–0.07)
Basophils Absolute: 0.1 10*3/uL (ref 0.0–0.1)
Basophils Relative: 1 %
Eosinophils Absolute: 0.3 10*3/uL (ref 0.0–0.5)
Eosinophils Relative: 3 %
HCT: 36.5 % — ABNORMAL LOW (ref 39.0–52.0)
Hemoglobin: 11.8 g/dL — ABNORMAL LOW (ref 13.0–17.0)
Immature Granulocytes: 0 %
Lymphocytes Relative: 29 %
Lymphs Abs: 2.6 10*3/uL (ref 0.7–4.0)
MCH: 27.4 pg (ref 26.0–34.0)
MCHC: 32.3 g/dL (ref 30.0–36.0)
MCV: 84.7 fL (ref 80.0–100.0)
Monocytes Absolute: 1 10*3/uL (ref 0.1–1.0)
Monocytes Relative: 12 %
Neutro Abs: 4.7 10*3/uL (ref 1.7–7.7)
Neutrophils Relative %: 55 %
Platelets: 225 10*3/uL (ref 150–400)
RBC: 4.31 MIL/uL (ref 4.22–5.81)
RDW: 18.6 % — ABNORMAL HIGH (ref 11.5–15.5)
WBC: 8.7 10*3/uL (ref 4.0–10.5)
nRBC: 0 % (ref 0.0–0.2)

## 2019-11-22 LAB — COMPREHENSIVE METABOLIC PANEL
ALT: 12 U/L (ref 0–44)
AST: 15 U/L (ref 15–41)
Albumin: 3.3 g/dL — ABNORMAL LOW (ref 3.5–5.0)
Alkaline Phosphatase: 67 U/L (ref 38–126)
Anion gap: 8 (ref 5–15)
BUN: 17 mg/dL (ref 8–23)
CO2: 25 mmol/L (ref 22–32)
Calcium: 8.7 mg/dL — ABNORMAL LOW (ref 8.9–10.3)
Chloride: 109 mmol/L (ref 98–111)
Creatinine, Ser: 1.57 mg/dL — ABNORMAL HIGH (ref 0.61–1.24)
GFR, Estimated: 47 mL/min — ABNORMAL LOW (ref >60)
Glucose, Bld: 97 mg/dL (ref 70–99)
Potassium: 3.7 mmol/L (ref 3.5–5.1)
Sodium: 142 mmol/L (ref 135–145)
Total Bilirubin: 1 mg/dL (ref 0.3–1.2)
Total Protein: 6.4 g/dL — ABNORMAL LOW (ref 6.5–8.1)

## 2019-11-22 MED ORDER — DEXTROSE 5 % IV SOLN
Freq: Once | INTRAVENOUS | Status: AC
  Filled 2019-11-22: qty 250

## 2019-11-22 MED ORDER — SODIUM CHLORIDE 0.9 % IV SOLN
2400.0000 mg/m2 | INTRAVENOUS | Status: DC
  Administered 2019-11-22: 5400 mg via INTRAVENOUS
  Filled 2019-11-22: qty 108

## 2019-11-22 MED ORDER — OXALIPLATIN CHEMO INJECTION 100 MG/20ML
80.0000 mg/m2 | Freq: Once | INTRAVENOUS | Status: AC
  Administered 2019-11-22: 180 mg via INTRAVENOUS
  Filled 2019-11-22: qty 36

## 2019-11-22 MED ORDER — SODIUM CHLORIDE 0.9% FLUSH
10.0000 mL | Freq: Once | INTRAVENOUS | Status: AC
  Administered 2019-11-22: 10 mL via INTRAVENOUS
  Filled 2019-11-22: qty 10

## 2019-11-22 MED ORDER — SODIUM CHLORIDE 0.9 % IV SOLN
10.0000 mg | Freq: Once | INTRAVENOUS | Status: AC
  Administered 2019-11-22: 10 mg via INTRAVENOUS
  Filled 2019-11-22: qty 10

## 2019-11-22 MED ORDER — NYSTATIN 100000 UNIT/ML MT SUSP: 5.0000 mL | Freq: Four times a day (QID) | OROMUCOSAL | 0 refills | Status: DC

## 2019-11-22 MED ORDER — PALONOSETRON HCL INJECTION 0.25 MG/5ML
0.2500 mg | Freq: Once | INTRAVENOUS | Status: AC
  Administered 2019-11-22: 0.25 mg via INTRAVENOUS
  Filled 2019-11-22: qty 5

## 2019-11-22 MED ORDER — LEUCOVORIN CALCIUM INJECTION 350 MG
900.0000 mg | Freq: Once | INTRAVENOUS | Status: AC
  Administered 2019-11-22: 900 mg via INTRAVENOUS
  Filled 2019-11-22: qty 45

## 2019-11-22 NOTE — Progress Notes (Signed)
Patient tolerated infusion well. No issues at discharge.

## 2019-11-22 NOTE — Progress Notes (Signed)
Hematology/Oncology Follow Up Note [facility]  Telephone:([phone] [phone]  Patient Care Team: [name], MD as PCP - General (Family Medicine) Borders, Kirt Boys, NP as Nurse Practitioner (Hospice and Palliative Medicine) [name], MD as Consulting Physician (Oncology) [name], MD as Referring Physician (Radiation Oncology) [name], NP as Nurse Practitioner   Name of the patient: Christopher Delacruz  [id number]  05-Jan-1951   REASON FOR VISIT  follow-up for rectal cancer  PERTINENT ONCOLOGY HISTORY Christopher Delacruz is a 69 y.o.[place] who has above oncology history reviewed by me today presented for follow up visit for management of locally advanced rectal cancer. -Initially presented to emergency room due to profound anemia. 09/11/2019 EGD showed duodenal erosions without bleeding 09/11/2019 colonoscopy preparation was poor.  Likely malignant partially obstructing tumor in the rectosigmoid colon area which was biopsied. 09/13/2019, repeat colonoscopy showed 12 mm polyps were found in the descending colon.  Polyp was sessile.  Polyp was resected and retrieved.  A polypoid ulcerated partially obstructing large mass was found from 10 to 20 cm proximal to the anus.  The mass was circumferential, measured 10 cm in length.  In addition the diameter measures 12 mm.  Oozing was present 09/11/2019, biopsy showed invasive colorectal adenocarcinoma.  Moderately differentiated. 09/13/2019, CT chest abdomen pelvis showed rectal wall mass with evidence of local invasion to the mesorectal fat and potential involvement of the right seminal vesicle.  Multiple enlarged lymph nodes.  Small lung nodules 2 to 3 mm in the lung bilaterally.  Highly nonspecific.  Cannot rule out metastasis.  09/14/2019 MRI pelvis showed T4b N2 rectal cancer. 09/15/2019 MRI brain with and without contrast showed punctate focus of restricted diffusion most likely reflects acute or  early subacute small vessel infarct.  No evidence of intracranial metastasis.  Multiple chronic infarct.  #Iron deficiency anemia, patient received PRBC transfusion and IV Venofer treatments. #Vitamin B12 deficiency, status post parenteral vitamin B12 injections during hospitalization.  Currently on vitamin B12 supplementation. #Patient was found to have very poor insight to his condition.  Was seen by psychiatrist and was deemed incompetent.  Patient's medical power of attorney is his nephew [name].  #Mediport was placed by Dr. Dahlia Byes  # #H. pylori gastritis, patient finishes course of antibiotics and PPI # 09/26/2019 started on chemotherapy with FOLFOX.  INTERVA L HISTORY 69 y.o. patient presents for follow-up of rectal cancer..  Patient is here by himself today.  Reports some mouth sore during the days he carries his 5-FU pump.  Denies any abdominal pain, blood in the stool.   Review of Systems  Unable to perform ROS: Other (Cognitive impairment)  Constitutional: Negative for fatigue.  Respiratory: Negative for shortness of breath.   Gastrointestinal: Negative for abdominal pain, blood in stool and constipation.      No Known Allergies   Past Medical History:  Diagnosis Date  . B12 deficiency 09/17/2019  . CVA (cerebral vascular accident) ([place])   . Diabetes mellitus ([place])   . Hyperlipidemia   . Hypertension      Past Surgical History:  Procedure Laterality Date  . COLONOSCOPY N/A 09/11/2019   Procedure: COLONOSCOPY;  Surgeon: [name], MD;  Location: [facility] ENDOSCOPY;  Service: Endoscopy;  Laterality: N/A;  . COLONOSCOPY WITH PROPOFOL N/A 09/12/2019   Procedure: COLONOSCOPY WITH PROPOFOL;  Surgeon: [name], MD;  Location: [facility] ENDOSCOPY;  Service: Gastroenterology;  Laterality: N/A;  . COLONOSCOPY WITH PROPOFOL N/A 09/13/2019   Procedure: COLONOSCOPY WITH PROPOFOL;  Surgeon: [name], MD;  Location: [facility] ENDOSCOPY;  Service: Gastroenterology;  Laterality: N/A;  .  ESOPHAGOGASTRODUODENOSCOPY N/A 09/11/2019   Procedure: ESOPHAGOGASTRODUODENOSCOPY (EGD);  Surgeon: [name], MD;  Location: [facility] ENDOSCOPY;  Service: Endoscopy;  Laterality: N/A;  . EXPLORE EYE SOCKET    . PORTACATH PLACEMENT N/A 09/15/2019   Procedure: INSERTION PORT-A-CATH;  Surgeon: Jules Husbands, MD;  Location: [facility] ORS;  Service: General;  Laterality: N/A;    Social History   Socioeconomic History  . Marital status: Divorced    Spouse name: Not on file  . Number of children: Not on file  . Years of education: Not on file  . Highest education level: Not on file  Occupational History  . Not on file  Tobacco Use  . Smoking status: Never Smoker  . Smokeless tobacco: Never Used  Vaping Use  . Vaping Use: Never used  Substance and Sexual Activity  . Alcohol use: Never    Alcohol/week: 0.0 standard drinks  . Drug use: Never  . Sexual activity: Not on file  Other Topics Concern  . Not on file  Social History Narrative  . Not on file   Social Determinants of Health   Financial Resource Strain:   . Difficulty of Paying Living Expenses: Not on file  Food Insecurity:   . Worried About Charity fundraiser in the Last Year: Not on file  . Ran Out of Food in the Last Year: Not on file  Transportation Needs: Unmet Transportation Needs  . Lack of Transportation (Medical): Yes  . Lack of Transportation (Non-Medical): Yes  Physical Activity:   . Days of Exercise per Week: Not on file  . Minutes of Exercise per Session: Not on file  Stress:   . Feeling of Stress : Not on file  Social Connections:   . Frequency of Communication with Friends and Family: Not on file  . Frequency of Social Gatherings with Friends and Family: Not on file  . Attends Religious Services: Not on file  . Active Member of Clubs or Organizations: Not on file  . Attends Archivist Meetings: Not on file  . Marital Status: Not on file  Intimate Partner Violence:   . Fear of Current or  Ex-Partner: Not on file  . Emotionally Abused: Not on file  . Physically Abused: Not on file  . Sexually Abused: Not on file    Family History  Problem Relation Age of Onset  . Heart attack Sister   . Leukemia Brother   . Stroke Brother      Current Outpatient Medications:  .  amLODipine (NORVASC) 10 MG tablet, Take 1 tablet (10 mg total) by mouth daily., Disp: 30 tablet, Rfl: 1 .  aspirin EC 81 MG EC tablet, Take 1 tablet (81 mg total) by mouth daily. Swallow whole., Disp: 30 tablet, Rfl: 11 .  clopidogrel (PLAVIX) 75 MG tablet, Take 1 tablet (75 mg total) by mouth daily., Disp: 30 tablet, Rfl: 1 .  ferrous sulfate 325 (65 FE) MG tablet, Take 1 tablet (325 mg total) by mouth daily., Disp: 30 tablet, Rfl: 3 .  finasteride (PROSCAR) 5 MG tablet, Take 5 mg by mouth daily., Disp: , Rfl:  .  lidocaine-prilocaine (EMLA) cream, Apply to affected area once, Disp: 30 g, Rfl: 3 .  multivitamin (ONE-A-DAY MEN'S) TABS tablet, Take 1 tablet by mouth daily., Disp: , Rfl:  .  ondansetron (ZOFRAN) 8 MG tablet, Take 1 tablet (8 mg total) by mouth 2 (  two) times daily as needed for refractory nausea / vomiting. Start on day 3 after chemotherapy., Disp: 30 tablet, Rfl: 1 .  prochlorperazine (COMPAZINE) 10 MG tablet, Take 1 tablet (10 mg total) by mouth every 6 (six) hours as needed (Nausea or vomiting)., Disp: 30 tablet, Rfl: 1 .  vitamin B-12 (CYANOCOBALAMIN) 1000 MCG tablet, Take 1,000 mcg by mouth daily., Disp: , Rfl:  No current facility-administered medications for this visit.  Facility-Administered Medications Ordered in Other Visits:  .  sodium chloride flush (NS) 0.9 % injection 10 mL, 10 mL, Intravenous, Once, [name], MD  Physical exam:  Vitals:   11/22/19 0838  BP: (!) 153/90  Pulse: (!) 57  Resp: 18  Temp: (!) 97.3 F (36.3 C)  Weight: 221 lb 3.2 oz (100.3 kg)   Physical Exam Constitutional:      General: He is not in acute distress.    Comments: Patient sits in the wheelchair.    HENT:     Head: Normocephalic and atraumatic.  Eyes:     General: No scleral icterus. Cardiovascular:     Rate and Rhythm: Normal rate and regular rhythm.     Heart sounds: Normal heart sounds.  Pulmonary:     Effort: Pulmonary effort is normal. No respiratory distress.     Breath sounds: No wheezing.  Abdominal:     General: Bowel sounds are normal. There is no distension.     Palpations: Abdomen is soft.  Musculoskeletal:        General: No deformity. Normal range of motion.     Cervical back: Normal range of motion and neck supple.  Skin:    General: Skin is warm and dry.     Findings: No erythema or rash.  Neurological:     Mental Status: He is alert. Mental status is at baseline.     Cranial Nerves: No cranial nerve deficit.     Coordination: Coordination normal.     Comments: Orientated x2  Psychiatric:        Mood and Affect: Mood normal.     CMP Latest Ref Rng & Units 11/08/2019  Glucose 70 - 99 mg/dL 137(H)  BUN 8 - 23 mg/dL 18  Creatinine 0.61 - 1.24 mg/dL 1.59(H)  Sodium 135 - 145 mmol/L 142  Potassium 3.5 - 5.1 mmol/L 4.0  Chloride 98 - 111 mmol/L 108  CO2 22 - 32 mmol/L 27  Calcium 8.9 - 10.3 mg/dL 8.7(L)  Total Protein 6.5 - 8.1 g/dL 6.6  Total Bilirubin 0.3 - 1.2 mg/dL 0.6  Alkaline Phos 38 - 126 U/L 67  AST 15 - 41 U/L 19  ALT 0 - 44 U/L 13   CBC Latest Ref Rng & Units 11/08/2019  WBC 4.0 - 10.5 K/uL 8.5  Hemoglobin 13.0 - 17.0 g/dL 11.6(L)  Hematocrit 39 - 52 % 36.4(L)  Platelets 150 - 400 K/uL 251    RADIOGRAPHIC STUDIES: I have personally reviewed the radiological images as listed and agreed with the findings in the report. No results found.   Assessment and plan 1. Rectal cancer ([place])   2. Encounter for antineoplastic chemotherapy   3. Iron deficiency anemia due to chronic blood loss   4. B12 deficiency   5. Cognitive impairment   Cancer Staging Rectal cancer [facility]) Staging form: Colon and Rectum, AJCC 8th Edition - Clinical stage  from 09/22/2019: Stage IIIC (cT4b, cN2, cM0) - Signed by [name], MD on 09/22/2019  Stage IIIC rectal cancer cT4 N2 M0  rectal cancer Status post 2 cycles FOLFOX with 5-FU bolus omitted Labs are reviewed and discussed with patient. Proceed with cycle 4 FOLFOX-no 5-FU bolus  #Iron deficiency anemia, Hemoglobin is stable today.   #Vitamin B12 deficiency, B12 level has improved.  Continue vitamin B12 supplementation. 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   We spent sufficient time to discuss many aspect of care, questions were answered to patient's satisfaction. Patient will follow up in 2 weeks evaluation prior to next cycle of treatment. [name], MD, PhD Hematology Oncology [facility] Pager- [phone] 11/22/2019

## 2019-11-22 NOTE — Progress Notes (Signed)
Pt here for follow up. No new concerns voiced. Pt does not know which medications he is taking. Attempted to call facility to do Med Rec but there was no answer.

## 2019-11-24 ENCOUNTER — Other Ambulatory Visit: Payer: Self-pay

## 2019-11-24 ENCOUNTER — Inpatient Hospital Stay: Payer: Medicare Other

## 2019-11-24 DIAGNOSIS — Z5111 Encounter for antineoplastic chemotherapy: Secondary | ICD-10-CM | POA: Diagnosis not present

## 2019-11-24 DIAGNOSIS — C20 Malignant neoplasm of rectum: Secondary | ICD-10-CM

## 2019-11-24 MED ORDER — HEPARIN SOD (PORK) LOCK FLUSH 100 UNIT/ML IV SOLN
INTRAVENOUS | Status: AC
  Filled 2019-11-24: qty 5

## 2019-11-24 MED ORDER — HEPARIN SOD (PORK) LOCK FLUSH 100 UNIT/ML IV SOLN
500.0000 [IU] | Freq: Once | INTRAVENOUS | Status: AC | PRN
  Administered 2019-11-24: 500 [IU]
  Filled 2019-11-24: qty 5

## 2019-11-24 MED ORDER — SODIUM CHLORIDE 0.9% FLUSH
10.0000 mL | INTRAVENOUS | Status: DC | PRN
  Administered 2019-11-24: 10 mL
  Filled 2019-11-24: qty 10

## 2019-12-06 ENCOUNTER — Encounter: Payer: Self-pay | Admitting: Oncology

## 2019-12-06 ENCOUNTER — Inpatient Hospital Stay: Payer: Medicare Other

## 2019-12-06 ENCOUNTER — Other Ambulatory Visit: Payer: Self-pay

## 2019-12-06 ENCOUNTER — Inpatient Hospital Stay (HOSPITAL_BASED_OUTPATIENT_CLINIC_OR_DEPARTMENT_OTHER): Payer: Medicare Other | Admitting: Oncology

## 2019-12-06 ENCOUNTER — Inpatient Hospital Stay: Payer: Medicare Other | Attending: Oncology

## 2019-12-06 VITALS — BP 166/81 | HR 66 | Temp 96.4°F | Resp 18 | Wt 228.7 lb

## 2019-12-06 DIAGNOSIS — Z806 Family history of leukemia: Secondary | ICD-10-CM | POA: Insufficient documentation

## 2019-12-06 DIAGNOSIS — Z8249 Family history of ischemic heart disease and other diseases of the circulatory system: Secondary | ICD-10-CM | POA: Insufficient documentation

## 2019-12-06 DIAGNOSIS — R918 Other nonspecific abnormal finding of lung field: Secondary | ICD-10-CM | POA: Insufficient documentation

## 2019-12-06 DIAGNOSIS — Z8673 Personal history of transient ischemic attack (TIA), and cerebral infarction without residual deficits: Secondary | ICD-10-CM | POA: Insufficient documentation

## 2019-12-06 DIAGNOSIS — E538 Deficiency of other specified B group vitamins: Secondary | ICD-10-CM | POA: Diagnosis not present

## 2019-12-06 DIAGNOSIS — Z5111 Encounter for antineoplastic chemotherapy: Secondary | ICD-10-CM

## 2019-12-06 DIAGNOSIS — R4189 Other symptoms and signs involving cognitive functions and awareness: Secondary | ICD-10-CM | POA: Diagnosis not present

## 2019-12-06 DIAGNOSIS — F0150 Vascular dementia without behavioral disturbance: Secondary | ICD-10-CM | POA: Diagnosis not present

## 2019-12-06 DIAGNOSIS — G3184 Mild cognitive impairment, so stated: Secondary | ICD-10-CM | POA: Diagnosis not present

## 2019-12-06 DIAGNOSIS — R599 Enlarged lymph nodes, unspecified: Secondary | ICD-10-CM | POA: Diagnosis not present

## 2019-12-06 DIAGNOSIS — C20 Malignant neoplasm of rectum: Secondary | ICD-10-CM

## 2019-12-06 DIAGNOSIS — Z79899 Other long term (current) drug therapy: Secondary | ICD-10-CM | POA: Diagnosis not present

## 2019-12-06 DIAGNOSIS — D509 Iron deficiency anemia, unspecified: Secondary | ICD-10-CM | POA: Diagnosis not present

## 2019-12-06 DIAGNOSIS — Z823 Family history of stroke: Secondary | ICD-10-CM | POA: Diagnosis not present

## 2019-12-06 LAB — COMPREHENSIVE METABOLIC PANEL
ALT: 19 U/L (ref 0–44)
AST: 20 U/L (ref 15–41)
Albumin: 3.4 g/dL — ABNORMAL LOW (ref 3.5–5.0)
Alkaline Phosphatase: 71 U/L (ref 38–126)
Anion gap: 9 (ref 5–15)
BUN: 18 mg/dL (ref 8–23)
CO2: 26 mmol/L (ref 22–32)
Calcium: 8.9 mg/dL (ref 8.9–10.3)
Chloride: 107 mmol/L (ref 98–111)
Creatinine, Ser: 1.59 mg/dL — ABNORMAL HIGH (ref 0.61–1.24)
GFR, Estimated: 46 mL/min — ABNORMAL LOW (ref >60)
Glucose, Bld: 155 mg/dL — ABNORMAL HIGH (ref 70–99)
Potassium: 3.8 mmol/L (ref 3.5–5.1)
Sodium: 142 mmol/L (ref 135–145)
Total Bilirubin: 0.7 mg/dL (ref 0.3–1.2)
Total Protein: 6.4 g/dL — ABNORMAL LOW (ref 6.5–8.1)

## 2019-12-06 LAB — CBC WITH DIFFERENTIAL/PLATELET
Abs Immature Granulocytes: 0.02 10*3/uL (ref 0.00–0.07)
Basophils Absolute: 0.1 10*3/uL (ref 0.0–0.1)
Basophils Relative: 1 %
Eosinophils Absolute: 0.2 10*3/uL (ref 0.0–0.5)
Eosinophils Relative: 2 %
HCT: 37.2 % — ABNORMAL LOW (ref 39.0–52.0)
Hemoglobin: 11.9 g/dL — ABNORMAL LOW (ref 13.0–17.0)
Immature Granulocytes: 0 %
Lymphocytes Relative: 20 %
Lymphs Abs: 1.7 10*3/uL (ref 0.7–4.0)
MCH: 27.7 pg (ref 26.0–34.0)
MCHC: 32 g/dL (ref 30.0–36.0)
MCV: 86.7 fL (ref 80.0–100.0)
Monocytes Absolute: 0.8 10*3/uL (ref 0.1–1.0)
Monocytes Relative: 9 %
Neutro Abs: 5.7 10*3/uL (ref 1.7–7.7)
Neutrophils Relative %: 68 %
Platelets: 205 10*3/uL (ref 150–400)
RBC: 4.29 MIL/uL (ref 4.22–5.81)
RDW: 17.3 % — ABNORMAL HIGH (ref 11.5–15.5)
WBC: 8.4 10*3/uL (ref 4.0–10.5)
nRBC: 0 % (ref 0.0–0.2)

## 2019-12-06 MED ORDER — LEUCOVORIN CALCIUM INJECTION 350 MG: 400.0000 mg/m2 | Freq: Once | INTRAVENOUS | Status: DC

## 2019-12-06 MED ORDER — PALONOSETRON HCL INJECTION 0.25 MG/5ML
0.2500 mg | Freq: Once | INTRAVENOUS | Status: AC
  Administered 2019-12-06: 0.25 mg via INTRAVENOUS
  Filled 2019-12-06: qty 5

## 2019-12-06 MED ORDER — OXALIPLATIN CHEMO INJECTION 100 MG/20ML
80.0000 mg/m2 | Freq: Once | INTRAVENOUS | Status: AC
  Administered 2019-12-06: 180 mg via INTRAVENOUS
  Filled 2019-12-06: qty 36

## 2019-12-06 MED ORDER — LEUCOVORIN CALCIUM INJECTION 100 MG
20.0000 mg/m2 | Freq: Once | INTRAMUSCULAR | Status: AC
  Administered 2019-12-06: 46 mg via INTRAVENOUS
  Filled 2019-12-06: qty 2.3

## 2019-12-06 MED ORDER — SODIUM CHLORIDE 0.9 % IV SOLN
2400.0000 mg/m2 | INTRAVENOUS | Status: DC
  Administered 2019-12-06: 5400 mg via INTRAVENOUS
  Filled 2019-12-06: qty 108

## 2019-12-06 MED ORDER — DEXTROSE 5 % IV SOLN
Freq: Once | INTRAVENOUS | Status: AC
  Filled 2019-12-06: qty 250

## 2019-12-06 MED ORDER — SODIUM CHLORIDE 0.9% FLUSH
10.0000 mL | Freq: Once | INTRAVENOUS | Status: AC
  Administered 2019-12-06: 10 mL via INTRAVENOUS
  Filled 2019-12-06: qty 10

## 2019-12-06 MED ORDER — SODIUM CHLORIDE 0.9 % IV SOLN
10.0000 mg | Freq: Once | INTRAVENOUS | Status: AC
  Administered 2019-12-06: 10 mg via INTRAVENOUS
  Filled 2019-12-06: qty 10

## 2019-12-06 NOTE — Progress Notes (Signed)
Hematology/Oncology Follow Up Note [facility]  Telephone:([phone] [phone]  Patient Care Team: [name], MD as PCP - General (Family Medicine) Borders, Kirt Boys, NP as Nurse Practitioner (Hospice and Palliative Medicine) [name], MD as Consulting Physician (Oncology) [name], MD as Referring Physician (Radiation Oncology) [name], NP as Nurse Practitioner   Name of the patient: Christopher Delacruz  [id number]  [birth year]   REASON FOR VISIT  follow-up for rectal cancer  PERTINENT ONCOLOGY HISTORY Christopher Delacruz is a 69 y.o.[place] who has above oncology history reviewed by me today presented for follow up visit for management of locally advanced rectal cancer. -Initially presented to emergency room due to profound anemia. 09/11/2019 EGD showed duodenal erosions without bleeding 09/11/2019 colonoscopy preparation was poor.  Likely malignant partially obstructing tumor in the rectosigmoid colon area which was biopsied. 09/13/2019, repeat colonoscopy showed 12 mm polyps were found in the descending colon.  Polyp was sessile.  Polyp was resected and retrieved.  A polypoid ulcerated partially obstructing large mass was found from 10 to 20 cm proximal to the anus.  The mass was circumferential, measured 10 cm in length.  In addition the diameter measures 12 mm.  Oozing was present 09/11/2019, biopsy showed invasive colorectal adenocarcinoma.  Moderately differentiated. 09/13/2019, CT chest abdomen pelvis showed rectal wall mass with evidence of local invasion to the mesorectal fat and potential involvement of the right seminal vesicle.  Multiple enlarged lymph nodes.  Small lung nodules 2 to 3 mm in the lung bilaterally.  Highly nonspecific.  Cannot rule out metastasis.  09/14/2019 MRI pelvis showed T4b N2 rectal cancer. 09/15/2019 MRI brain with and without contrast showed punctate focus of restricted diffusion most likely reflects acute or  early subacute small vessel infarct.  No evidence of intracranial metastasis.  Multiple chronic infarct.  #Iron deficiency anemia, patient received PRBC transfusion and IV Venofer treatments. #Vitamin B12 deficiency, status post parenteral vitamin B12 injections during hospitalization.  Currently on vitamin B12 supplementation. #Patient was found to have very poor insight to his condition.  Was seen by psychiatrist and was deemed incompetent.  Patient's medical power of attorney is his nephew Christopher Delacruz.  #Mediport was placed by Dr. Dahlia Byes  # #H. pylori gastritis, patient finishes course of antibiotics and PPI # 09/26/2019 started on chemotherapy with FOLFOX.  INTERVA L HISTORY 69 y.o. patient presents for follow-up of rectal cancer..  Patient is here by himself today.  Reports some mouth sore during the days he carries his 5-FU pump.  Denies any abdominal pain, blood in the stool.   Review of Systems  Unable to perform ROS: Other (Cognitive impairment)  Constitutional: Negative for fatigue.  Respiratory: Negative for shortness of breath.   Gastrointestinal: Negative for abdominal pain, blood in stool and constipation.      No Known Allergies   Past Medical History:  Diagnosis Date  . B12 deficiency 09/17/2019  . CVA (cerebral vascular accident) ([place])   . Diabetes mellitus ([place])   . Hyperlipidemia   . Hypertension      Past Surgical History:  Procedure Laterality Date  . COLONOSCOPY N/A 09/11/2019   Procedure: COLONOSCOPY;  Surgeon: [name], MD;  Location: [facility] ENDOSCOPY;  Service: Endoscopy;  Laterality: N/A;  . COLONOSCOPY WITH PROPOFOL N/A 09/12/2019   Procedure: COLONOSCOPY WITH PROPOFOL;  Surgeon: [name], MD;  Location: [facility] ENDOSCOPY;  Service: Gastroenterology;  Laterality: N/A;  . COLONOSCOPY WITH PROPOFOL N/A 09/13/2019   Procedure: COLONOSCOPY WITH PROPOFOL;  Surgeon: [name], MD;  Location: [facility] ENDOSCOPY;  Service: Gastroenterology;  Laterality: N/A;  .  ESOPHAGOGASTRODUODENOSCOPY N/A 09/11/2019   Procedure: ESOPHAGOGASTRODUODENOSCOPY (EGD);  Surgeon: [name], MD;  Location: [facility] ENDOSCOPY;  Service: Endoscopy;  Laterality: N/A;  . EXPLORE EYE SOCKET    . PORTACATH PLACEMENT N/A 09/15/2019   Procedure: INSERTION PORT-A-CATH;  Surgeon: Jules Husbands, MD;  Location: [facility] ORS;  Service: General;  Laterality: N/A;    Social History   Socioeconomic History  . Marital status: Divorced    Spouse name: Not on file  . Number of children: Not on file  . Years of education: Not on file  . Highest education level: Not on file  Occupational History  . Not on file  Tobacco Use  . Smoking status: Never Smoker  . Smokeless tobacco: Never Used  Vaping Use  . Vaping Use: Never used  Substance and Sexual Activity  . Alcohol use: Never    Alcohol/week: 0.0 standard drinks  . Drug use: Never  . Sexual activity: Not on file  Other Topics Concern  . Not on file  Social History Narrative  . Not on file   Social Determinants of Health   Financial Resource Strain:   . Difficulty of Paying Living Expenses: Not on file  Food Insecurity:   . Worried About Charity fundraiser in the Last Year: Not on file  . Ran Out of Food in the Last Year: Not on file  Transportation Needs: Unmet Transportation Needs  . Lack of Transportation (Medical): Yes  . Lack of Transportation (Non-Medical): Yes  Physical Activity:   . Days of Exercise per Week: Not on file  . Minutes of Exercise per Session: Not on file  Stress:   . Feeling of Stress : Not on file  Social Connections:   . Frequency of Communication with Friends and Family: Not on file  . Frequency of Social Gatherings with Friends and Family: Not on file  . Attends Religious Services: Not on file  . Active Member of Clubs or Organizations: Not on file  . Attends Archivist Meetings: Not on file  . Marital Status: Not on file  Intimate Partner Violence:   . Fear of Current or  Ex-Partner: Not on file  . Emotionally Abused: Not on file  . Physically Abused: Not on file  . Sexually Abused: Not on file    Family History  Problem Relation Age of Onset  . Heart attack Sister   . Leukemia Brother   . Stroke Brother      Current Outpatient Medications:  .  amLODipine (NORVASC) 10 MG tablet, Take 1 tablet (10 mg total) by mouth daily., Disp: 30 tablet, Rfl: 1 .  aspirin EC 81 MG EC tablet, Take 1 tablet (81 mg total) by mouth daily. Swallow whole., Disp: 30 tablet, Rfl: 11 .  clopidogrel (PLAVIX) 75 MG tablet, Take 1 tablet (75 mg total) by mouth daily., Disp: 30 tablet, Rfl: 1 .  ferrous sulfate 325 (65 FE) MG tablet, Take 1 tablet (325 mg total) by mouth daily., Disp: 30 tablet, Rfl: 3 .  finasteride (PROSCAR) 5 MG tablet, Take 5 mg by mouth daily., Disp: , Rfl:  .  lidocaine-prilocaine (EMLA) cream, Apply to affected area once, Disp: 30 g, Rfl: 3 .  multivitamin (ONE-A-DAY MEN'S) TABS tablet, Take 1 tablet by mouth daily., Disp: , Rfl:  .  nystatin (MYCOSTATIN) 100000 UNIT/ML suspension, Take 5 mLs (500,000 Units total) by mouth 4 (  four) times daily. Swish and spit, Disp: 473 mL, Rfl: 0 .  ondansetron (ZOFRAN) 8 MG tablet, Take 1 tablet (8 mg total) by mouth 2 (two) times daily as needed for refractory nausea / vomiting. Start on day 3 after chemotherapy., Disp: 30 tablet, Rfl: 1 .  prochlorperazine (COMPAZINE) 10 MG tablet, Take 1 tablet (10 mg total) by mouth every 6 (six) hours as needed (Nausea or vomiting)., Disp: 30 tablet, Rfl: 1 .  vitamin B-12 (CYANOCOBALAMIN) 1000 MCG tablet, Take 1,000 mcg by mouth daily., Disp: , Rfl:  No current facility-administered medications for this visit.  Facility-Administered Medications Ordered in Other Visits:  .  fluorouracil (ADRUCIL) 5,400 mg in sodium chloride 0.9 % 142 mL chemo infusion, 2,400 mg/m2 (Treatment Plan Recorded), Intravenous, 1 day or 1 dose, [name], MD, 5,400 mg at 12/06/19 1224  Physical exam:    Vitals:   12/06/19 0844  BP: (!) 166/81  Pulse: 66  Resp: 18  Temp: (!) 96.4 F (35.8 C)  Weight: 228 lb 11.2 oz (103.7 kg)   Physical Exam Constitutional:      General: He is not in acute distress.    Comments: Patient sits in the wheelchair.  HENT:     Head: Normocephalic and atraumatic.  Eyes:     General: No scleral icterus. Cardiovascular:     Rate and Rhythm: Normal rate and regular rhythm.     Heart sounds: Normal heart sounds.  Pulmonary:     Effort: Pulmonary effort is normal. No respiratory distress.     Breath sounds: No wheezing.  Abdominal:     General: Bowel sounds are normal. There is no distension.     Palpations: Abdomen is soft.  Musculoskeletal:        General: No deformity. Normal range of motion.     Cervical back: Normal range of motion and neck supple.  Skin:    General: Skin is warm and dry.     Findings: No erythema or rash.  Neurological:     Mental Status: He is alert. Mental status is at baseline.     Cranial Nerves: No cranial nerve deficit.     Coordination: Coordination normal.     Comments: Orientated x2  Psychiatric:        Mood and Affect: Mood normal.     CMP Latest Ref Rng & Units 12/06/2019  Glucose 70 - 99 mg/dL 155(H)  BUN 8 - 23 mg/dL 18  Creatinine 0.61 - 1.24 mg/dL 1.59(H)  Sodium 135 - 145 mmol/L 142  Potassium 3.5 - 5.1 mmol/L 3.8  Chloride 98 - 111 mmol/L 107  CO2 22 - 32 mmol/L 26  Calcium 8.9 - 10.3 mg/dL 8.9  Total Protein 6.5 - 8.1 g/dL 6.4(L)  Total Bilirubin 0.3 - 1.2 mg/dL 0.7  Alkaline Phos 38 - 126 U/L 71  AST 15 - 41 U/L 20  ALT 0 - 44 U/L 19   CBC Latest Ref Rng & Units 12/06/2019  WBC 4.0 - 10.5 K/uL 8.4  Hemoglobin 13.0 - 17.0 g/dL 11.9(L)  Hematocrit 39 - 52 % 37.2(L)  Platelets 150 - 400 K/uL 205    RADIOGRAPHIC STUDIES: I have personally reviewed the radiological images as listed and agreed with the findings in the report. No results found.   Assessment and plan 1. Rectal cancer ([place])    2. Encounter for antineoplastic chemotherapy   3. B12 deficiency   4. Cognitive impairment   Cancer Staging Rectal cancer [facility]) Staging form: Colon  and Rectum, AJCC 8th Edition - Clinical stage from 09/22/2019: Stage IIIC (cT4b, cN2, cM0) - Signed by [name], MD on 09/22/2019  Stage IIIC rectal cancer cT4 N2 M0 rectal cancer Labs are reviewed and discussed with patient. Proceed with cycle 6 FOLFOX with no 5-FU bolus Patient tolerates treatment very well. We discussed about total plan of 8 cycles FOLFOX treatment then obtain images prior to concurrent chemotherapy radiation.  He will be referred to radiation oncologist for evaluation.  #Anemia, hemoglobin stable at 11.9. #Vitamin B12 deficiency, B12 level has improved.  Continue B12 supplementation.   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   We spent sufficient time to discuss many aspect of care, questions were answered to patient's satisfaction. Patient will follow up in 2 weeks evaluation prior to next cycle of treatment. [name], MD, PhD Hematology Oncology [facility] Pager- [phone] 12/06/2019

## 2019-12-06 NOTE — Progress Notes (Signed)
Pt here for follow up. No new concerns voiced. Called [facility] to review meds, but they will fax Med rec instead. Asked them to send medication list with patient for future appts.

## 2019-12-06 NOTE — Progress Notes (Signed)
Pt tolerated infusion well. No s/s of distress or reaction noted. Pt stable at discharge.  

## 2019-12-06 NOTE — Telephone Encounter (Signed)
That appt was made by someone at that office I'm guessing. Someone name PEREZ, Wilford Grist It was sched on 10/11/19. I asked [name] if he was aware of  Transportation being sched for 12/1 he told me the it was to see the GI Doctor.

## 2019-12-08 ENCOUNTER — Inpatient Hospital Stay: Payer: Medicare Other

## 2019-12-08 DIAGNOSIS — C20 Malignant neoplasm of rectum: Secondary | ICD-10-CM

## 2019-12-08 DIAGNOSIS — Z5111 Encounter for antineoplastic chemotherapy: Secondary | ICD-10-CM | POA: Diagnosis not present

## 2019-12-08 MED ORDER — SODIUM CHLORIDE 0.9% FLUSH
10.0000 mL | INTRAVENOUS | Status: DC | PRN
  Administered 2019-12-08: 10 mL
  Filled 2019-12-08: qty 10

## 2019-12-08 MED ORDER — HEPARIN SOD (PORK) LOCK FLUSH 100 UNIT/ML IV SOLN
500.0000 [IU] | Freq: Once | INTRAVENOUS | Status: AC | PRN
  Administered 2019-12-08: 500 [IU]
  Filled 2019-12-08: qty 5

## 2019-12-08 MED ORDER — HEPARIN SOD (PORK) LOCK FLUSH 100 UNIT/ML IV SOLN
INTRAVENOUS | Status: AC
  Filled 2019-12-08: qty 5

## 2019-12-27 ENCOUNTER — Inpatient Hospital Stay: Payer: Medicare Other

## 2019-12-27 ENCOUNTER — Encounter: Payer: Self-pay | Admitting: Oncology

## 2019-12-27 ENCOUNTER — Inpatient Hospital Stay (HOSPITAL_BASED_OUTPATIENT_CLINIC_OR_DEPARTMENT_OTHER): Payer: Medicare Other | Admitting: Oncology

## 2019-12-27 ENCOUNTER — Other Ambulatory Visit: Payer: Self-pay

## 2019-12-27 VITALS — BP 154/82 | HR 57 | Temp 96.0°F | Resp 18 | Wt 231.3 lb

## 2019-12-27 DIAGNOSIS — E538 Deficiency of other specified B group vitamins: Secondary | ICD-10-CM | POA: Diagnosis not present

## 2019-12-27 DIAGNOSIS — C20 Malignant neoplasm of rectum: Secondary | ICD-10-CM

## 2019-12-27 DIAGNOSIS — R4189 Other symptoms and signs involving cognitive functions and awareness: Secondary | ICD-10-CM

## 2019-12-27 DIAGNOSIS — Z5111 Encounter for antineoplastic chemotherapy: Secondary | ICD-10-CM | POA: Diagnosis not present

## 2019-12-27 DIAGNOSIS — D500 Iron deficiency anemia secondary to blood loss (chronic): Secondary | ICD-10-CM

## 2019-12-27 LAB — CBC WITH DIFFERENTIAL/PLATELET
Abs Immature Granulocytes: 0.01 10*3/uL (ref 0.00–0.07)
Basophils Absolute: 0.1 10*3/uL (ref 0.0–0.1)
Basophils Relative: 1 %
Eosinophils Absolute: 0.2 10*3/uL (ref 0.0–0.5)
Eosinophils Relative: 3 %
HCT: 38.2 % — ABNORMAL LOW (ref 39.0–52.0)
Hemoglobin: 12.3 g/dL — ABNORMAL LOW (ref 13.0–17.0)
Immature Granulocytes: 0 %
Lymphocytes Relative: 32 %
Lymphs Abs: 2 10*3/uL (ref 0.7–4.0)
MCH: 28.7 pg (ref 26.0–34.0)
MCHC: 32.2 g/dL (ref 30.0–36.0)
MCV: 89 fL (ref 80.0–100.0)
Monocytes Absolute: 0.9 10*3/uL (ref 0.1–1.0)
Monocytes Relative: 14 %
Neutro Abs: 3.1 10*3/uL (ref 1.7–7.7)
Neutrophils Relative %: 50 %
Platelets: 223 10*3/uL (ref 150–400)
RBC: 4.29 MIL/uL (ref 4.22–5.81)
RDW: 15.5 % (ref 11.5–15.5)
WBC: 6.2 10*3/uL (ref 4.0–10.5)
nRBC: 0 % (ref 0.0–0.2)

## 2019-12-27 LAB — COMPREHENSIVE METABOLIC PANEL
ALT: 53 U/L — ABNORMAL HIGH (ref 0–44)
AST: 35 U/L (ref 15–41)
Albumin: 3.4 g/dL — ABNORMAL LOW (ref 3.5–5.0)
Alkaline Phosphatase: 63 U/L (ref 38–126)
Anion gap: 9 (ref 5–15)
BUN: 18 mg/dL (ref 8–23)
CO2: 27 mmol/L (ref 22–32)
Calcium: 9.1 mg/dL (ref 8.9–10.3)
Chloride: 106 mmol/L (ref 98–111)
Creatinine, Ser: 1.45 mg/dL — ABNORMAL HIGH (ref 0.61–1.24)
GFR, Estimated: 52 mL/min — ABNORMAL LOW (ref >60)
Glucose, Bld: 97 mg/dL (ref 70–99)
Potassium: 3.8 mmol/L (ref 3.5–5.1)
Sodium: 142 mmol/L (ref 135–145)
Total Bilirubin: 0.8 mg/dL (ref 0.3–1.2)
Total Protein: 6.8 g/dL (ref 6.5–8.1)

## 2019-12-27 LAB — RETIC PANEL
Immature Retic Fract: 13.5 % (ref 2.3–15.9)
RBC.: 4.28 MIL/uL (ref 4.22–5.81)
Retic Count, Absolute: 70.6 10*3/uL (ref 19.0–186.0)
Retic Ct Pct: 1.7 % (ref 0.4–3.1)
Reticulocyte Hemoglobin: 32.4 pg (ref >27.9)

## 2019-12-27 MED ORDER — DEXTROSE 5 % IV SOLN
Freq: Once | INTRAVENOUS | Status: AC
  Filled 2019-12-27: qty 250

## 2019-12-27 MED ORDER — HEPARIN SOD (PORK) LOCK FLUSH 100 UNIT/ML IV SOLN
500.0000 [IU] | Freq: Once | INTRAVENOUS | Status: DC | PRN
  Filled 2019-12-27: qty 5

## 2019-12-27 MED ORDER — SODIUM CHLORIDE 0.9 % IV SOLN
10.0000 mg | Freq: Once | INTRAVENOUS | Status: AC
  Administered 2019-12-27: 10 mg via INTRAVENOUS
  Filled 2019-12-27: qty 10

## 2019-12-27 MED ORDER — LEUCOVORIN CALCIUM INJECTION 350 MG: 900.0000 mg | Freq: Once | INTRAVENOUS | Status: DC

## 2019-12-27 MED ORDER — LEUCOVORIN CALCIUM INJECTION 100 MG
20.0000 mg/m2 | Freq: Once | INTRAMUSCULAR | Status: AC
  Administered 2019-12-27: 46 mg via INTRAVENOUS
  Filled 2019-12-27: qty 2.3

## 2019-12-27 MED ORDER — OXALIPLATIN CHEMO INJECTION 100 MG/20ML
80.0000 mg/m2 | Freq: Once | INTRAVENOUS | Status: AC
  Administered 2019-12-27: 180 mg via INTRAVENOUS
  Filled 2019-12-27: qty 36

## 2019-12-27 MED ORDER — SODIUM CHLORIDE 0.9 % IV SOLN
2400.0000 mg/m2 | INTRAVENOUS | Status: DC
  Administered 2019-12-27: 5400 mg via INTRAVENOUS
  Filled 2019-12-27: qty 108

## 2019-12-27 MED ORDER — PALONOSETRON HCL INJECTION 0.25 MG/5ML
0.2500 mg | Freq: Once | INTRAVENOUS | Status: AC
  Administered 2019-12-27: 0.25 mg via INTRAVENOUS
  Filled 2019-12-27: qty 5

## 2019-12-27 NOTE — Progress Notes (Signed)
Hematology/Oncology Follow Up Note [facility]  Telephone:([phone] Fax:([phone]  Patient Care Team: [name], MD as PCP - General (Family Medicine) Borders, Kirt Boys, NP as Nurse Practitioner (Hospice and Palliative Medicine) [name], MD as Consulting Physician (Oncology) [name], MD as Referring Physician (Radiation Oncology) [name], NP as Nurse Practitioner [name], RN as Oncology Nurse Navigator   Name of the patient: Christopher Delacruz  [id number]  15-Apr-1950   REASON FOR VISIT  follow-up for rectal cancer  PERTINENT ONCOLOGY HISTORY Christopher Delacruz is a 69 y.o.[place] who has above oncology history reviewed by me today presented for follow up visit for management of locally advanced rectal cancer. -Initially presented to emergency room due to profound anemia. 09/11/2019 EGD showed duodenal erosions without bleeding 09/11/2019 colonoscopy preparation was poor.  Likely malignant partially obstructing tumor in the rectosigmoid colon area which was biopsied. 09/13/2019, repeat colonoscopy showed 12 mm polyps were found in the descending colon.  Polyp was sessile.  Polyp was resected and retrieved.  A polypoid ulcerated partially obstructing large mass was found from 10 to 20 cm proximal to the anus.  The mass was circumferential, measured 10 cm in length.  In addition the diameter measures 12 mm.  Oozing was present 09/11/2019, biopsy showed invasive colorectal adenocarcinoma.  Moderately differentiated. 09/13/2019, CT chest abdomen pelvis showed rectal wall mass with evidence of local invasion to the mesorectal fat and potential involvement of the right seminal vesicle.  Multiple enlarged lymph nodes.  Small lung nodules 2 to 3 mm in the lung bilaterally.  Highly nonspecific.  Cannot rule out metastasis.  09/14/2019 MRI pelvis showed T4b N2 rectal cancer. 09/15/2019 MRI brain with and without contrast showed punctate focus of  restricted diffusion most likely reflects acute or early subacute small vessel infarct.  No evidence of intracranial metastasis.  Multiple chronic infarct.  #Iron deficiency anemia, patient received PRBC transfusion and IV Venofer treatments. #Vitamin B12 deficiency, status post parenteral vitamin B12 injections during hospitalization.  Currently on vitamin B12 supplementation. #Patient was found to have very poor insight to his condition.  Was seen by psychiatrist and was deemed incompetent.  Patient's medical power of attorney is his nephew Christopher Delacruz.  #Mediport was placed by Dr. Dahlia Byes  # #H. pylori gastritis, patient finishes course of antibiotics and PPI # 09/26/2019 started on chemotherapy with FOLFOX.  INTERVA L HISTORY 69 y.o. patient presents for follow-up of rectal cancer..  Patient is here by himself today.  Patient is not competent and has poor memory.  He reports no new complaints. I called patient's power of attorney Christopher Delacruz and he does not report any additional concerns or symptoms patient mentioned during interval.  Review of Systems  Unable to perform ROS: Other (Cognitive impairment)  Constitutional: Negative for fatigue.  Respiratory: Negative for shortness of breath.   Gastrointestinal: Negative for abdominal pain, blood in stool and constipation.      No Known Allergies   Past Medical History:  Diagnosis Date  . B12 deficiency 09/17/2019  . CVA (cerebral vascular accident) ([place])   . Diabetes mellitus ([place])   . Hyperlipidemia   . Hypertension      Past Surgical History:  Procedure Laterality Date  . COLONOSCOPY N/A 09/11/2019   Procedure: COLONOSCOPY;  Surgeon: [name], MD;  Location: [facility] ENDOSCOPY;  Service: Endoscopy;  Laterality: N/A;  . COLONOSCOPY WITH PROPOFOL N/A 09/12/2019   Procedure: COLONOSCOPY WITH PROPOFOL;  Surgeon: [name], MD;  Location: [facility]  ENDOSCOPY;  Service: Gastroenterology;  Laterality: N/A;  . COLONOSCOPY WITH PROPOFOL N/A 09/13/2019    Procedure: COLONOSCOPY WITH PROPOFOL;  Surgeon: [name], MD;  Location: [facility] ENDOSCOPY;  Service: Gastroenterology;  Laterality: N/A;  . ESOPHAGOGASTRODUODENOSCOPY N/A 09/11/2019   Procedure: ESOPHAGOGASTRODUODENOSCOPY (EGD);  Surgeon: [name], MD;  Location: [facility] ENDOSCOPY;  Service: Endoscopy;  Laterality: N/A;  . EXPLORE EYE SOCKET    . PORTACATH PLACEMENT N/A 09/15/2019   Procedure: INSERTION PORT-A-CATH;  Surgeon: Jules Husbands, MD;  Location: [facility] ORS;  Service: General;  Laterality: N/A;    Social History   Socioeconomic History  . Marital status: Divorced    Spouse name: Not on file  . Number of children: Not on file  . Years of education: Not on file  . Highest education level: Not on file  Occupational History  . Not on file  Tobacco Use  . Smoking status: Never Smoker  . Smokeless tobacco: Never Used  Vaping Use  . Vaping Use: Never used  Substance and Sexual Activity  . Alcohol use: Never    Alcohol/week: 0.0 standard drinks  . Drug use: Never  . Sexual activity: Not on file  Other Topics Concern  . Not on file  Social History Narrative  . Not on file   Social Determinants of Health   Financial Resource Strain:   . Difficulty of Paying Living Expenses: Not on file  Food Insecurity:   . Worried About Charity fundraiser in the Last Year: Not on file  . Ran Out of Food in the Last Year: Not on file  Transportation Needs: Unmet Transportation Needs  . Lack of Transportation (Medical): Yes  . Lack of Transportation (Non-Medical): Yes  Physical Activity:   . Days of Exercise per Week: Not on file  . Minutes of Exercise per Session: Not on file  Stress:   . Feeling of Stress : Not on file  Social Connections:   . Frequency of Communication with Friends and Family: Not on file  . Frequency of Social Gatherings with Friends and Family: Not on file  . Attends Religious Services: Not on file  . Active Member of Clubs or Organizations: Not on file  .  Attends Archivist Meetings: Not on file  . Marital Status: Not on file  Intimate Partner Violence:   . Fear of Current or Ex-Partner: Not on file  . Emotionally Abused: Not on file  . Physically Abused: Not on file  . Sexually Abused: Not on file    Family History  Problem Relation Age of Onset  . Heart attack Sister   . Leukemia Brother   . Stroke Brother      Current Outpatient Medications:  .  amLODipine (NORVASC) 10 MG tablet, Take 1 tablet (10 mg total) by mouth daily., Disp: 30 tablet, Rfl: 1 .  aspirin EC 81 MG EC tablet, Take 1 tablet (81 mg total) by mouth daily. Swallow whole., Disp: 30 tablet, Rfl: 11 .  clopidogrel (PLAVIX) 75 MG tablet, Take 1 tablet (75 mg total) by mouth daily., Disp: 30 tablet, Rfl: 1 .  ferrous sulfate 325 (65 FE) MG tablet, Take 1 tablet (325 mg total) by mouth daily., Disp: 30 tablet, Rfl: 3 .  finasteride (PROSCAR) 5 MG tablet, Take 5 mg by mouth daily., Disp: , Rfl:  .  lidocaine-prilocaine (EMLA) cream, Apply to affected area once, Disp: 30 g, Rfl: 3 .  multivitamin (ONE-A-DAY MEN'S) TABS tablet, Take 1 tablet by mouth  daily., Disp: , Rfl:  .  nystatin (MYCOSTATIN) 100000 UNIT/ML suspension, Take 5 mLs (500,000 Units total) by mouth 4 (four) times daily. Swish and spit, Disp: 473 mL, Rfl: 0 .  ondansetron (ZOFRAN) 8 MG tablet, Take 1 tablet (8 mg total) by mouth 2 (two) times daily as needed for refractory nausea / vomiting. Start on day 3 after chemotherapy., Disp: 30 tablet, Rfl: 1 .  prochlorperazine (COMPAZINE) 10 MG tablet, Take 1 tablet (10 mg total) by mouth every 6 (six) hours as needed (Nausea or vomiting)., Disp: 30 tablet, Rfl: 1 .  vitamin B-12 (CYANOCOBALAMIN) 1000 MCG tablet, Take 1,000 mcg by mouth daily., Disp: , Rfl:   Physical exam:  Vitals:   12/27/19 0849  BP: (!) 154/82  Pulse: (!) 57  Resp: 18  Temp: (!) 96 F (35.6 C)  Weight: 231 lb 4.8 oz (104.9 kg)   Physical Exam Constitutional:      General: He is  not in acute distress.    Comments: Patient sits in the wheelchair.  HENT:     Head: Normocephalic and atraumatic.  Eyes:     General: No scleral icterus. Cardiovascular:     Rate and Rhythm: Normal rate and regular rhythm.     Heart sounds: Normal heart sounds.  Pulmonary:     Effort: Pulmonary effort is normal. No respiratory distress.     Breath sounds: No wheezing.  Abdominal:     General: Bowel sounds are normal. There is no distension.     Palpations: Abdomen is soft.  Musculoskeletal:        General: No deformity. Normal range of motion.     Cervical back: Normal range of motion and neck supple.  Skin:    General: Skin is warm and dry.     Findings: No erythema or rash.  Neurological:     Mental Status: He is alert. Mental status is at baseline.     Cranial Nerves: No cranial nerve deficit.     Coordination: Coordination normal.     Comments: Orientated x2  Psychiatric:        Mood and Affect: Mood normal.     CMP Latest Ref Rng & Units 12/06/2019  Glucose 70 - 99 mg/dL 155(H)  BUN 8 - 23 mg/dL 18  Creatinine 0.61 - 1.24 mg/dL 1.59(H)  Sodium 135 - 145 mmol/L 142  Potassium 3.5 - 5.1 mmol/L 3.8  Chloride 98 - 111 mmol/L 107  CO2 22 - 32 mmol/L 26  Calcium 8.9 - 10.3 mg/dL 8.9  Total Protein 6.5 - 8.1 g/dL 6.4(L)  Total Bilirubin 0.3 - 1.2 mg/dL 0.7  Alkaline Phos 38 - 126 U/L 71  AST 15 - 41 U/L 20  ALT 0 - 44 U/L 19   CBC Latest Ref Rng & Units 12/06/2019  WBC 4.0 - 10.5 K/uL 8.4  Hemoglobin 13.0 - 17.0 g/dL 11.9(L)  Hematocrit 39 - 52 % 37.2(L)  Platelets 150 - 400 K/uL 205    RADIOGRAPHIC STUDIES: I have personally reviewed the radiological images as listed and agreed with the findings in the report. No results found.   Assessment and plan 1. Rectal cancer ([place])   2. Encounter for antineoplastic chemotherapy   3. B12 deficiency   4. Cognitive impairment   Cancer Staging Rectal cancer [facility]) Staging form: Colon and Rectum, AJCC 8th Edition -  Clinical stage from 09/22/2019: Stage IIIC (cT4b, cN2, cM0) - Signed by [name], MD on 09/22/2019  Stage IIIC rectal cancer  cT4 N2 M0 rectal cancer Patient has been doing very well clinically. Labs reviewed and discussed with patient. Recommend to proceed with cycle 7 FOLFOX. I called patient's power of attorney Christopher Delacruz and updated him with above plan and he agrees. I also reviewed the plan with him about total of 8 cycles of FOLFOX, obtain images and then proceed with concurrent chemotherapy radiation, followed by surgical resection. Christopher Delacruz expresses his interest in patient pursuing surgical resection option.  I discussed with him about the referral options and he is okay with going to [name] or [facility] for further evaluation. I further discussed with nurse navigator [place].  Referred patient to establish care with radiation oncology.  I will also start to look into his coverage of [employer].   Anemia, hemoglobin has normalized. Vitamin B12 deficiency, continue B12 supplementation. 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  Continue monitor.  We spent sufficient time to discuss many aspect of care, questions were answered to patient's satisfaction. Patient will follow up in 2 weeks evaluation prior to next cycle of treatment.   [name], MD, PhD Hematology Oncology [place] at [facility] Pager- [phone] 12/27/2019

## 2019-12-27 NOTE — Progress Notes (Signed)
Pt here for follow up. No new concerns voiced. Med rec done with facility MAR.

## 2019-12-27 NOTE — Progress Notes (Signed)
Per MD RN patient's labs were good and ok to proceed with chemo. Patient tolerated infusion well, no concerns voiced. Patient discharged with chemo pump.

## 2019-12-28 ENCOUNTER — Other Ambulatory Visit: Payer: Self-pay

## 2019-12-28 DIAGNOSIS — C20 Malignant neoplasm of rectum: Secondary | ICD-10-CM

## 2019-12-28 NOTE — Progress Notes (Signed)
Referral sent to [facility] for surgical evaluation.

## 2019-12-29 ENCOUNTER — Inpatient Hospital Stay: Payer: Medicare Other | Attending: Oncology

## 2019-12-29 ENCOUNTER — Other Ambulatory Visit: Payer: Self-pay

## 2019-12-29 ENCOUNTER — Telehealth: Payer: Self-pay

## 2019-12-29 ENCOUNTER — Telehealth: Payer: Self-pay | Admitting: Pharmacist

## 2019-12-29 ENCOUNTER — Telehealth: Payer: Self-pay | Admitting: Pharmacy Technician

## 2019-12-29 DIAGNOSIS — Z823 Family history of stroke: Secondary | ICD-10-CM | POA: Insufficient documentation

## 2019-12-29 DIAGNOSIS — R911 Solitary pulmonary nodule: Secondary | ICD-10-CM | POA: Insufficient documentation

## 2019-12-29 DIAGNOSIS — G3184 Mild cognitive impairment, so stated: Secondary | ICD-10-CM | POA: Insufficient documentation

## 2019-12-29 DIAGNOSIS — C20 Malignant neoplasm of rectum: Secondary | ICD-10-CM

## 2019-12-29 DIAGNOSIS — E538 Deficiency of other specified B group vitamins: Secondary | ICD-10-CM | POA: Diagnosis not present

## 2019-12-29 DIAGNOSIS — Z8673 Personal history of transient ischemic attack (TIA), and cerebral infarction without residual deficits: Secondary | ICD-10-CM | POA: Diagnosis not present

## 2019-12-29 DIAGNOSIS — Z79899 Other long term (current) drug therapy: Secondary | ICD-10-CM | POA: Diagnosis not present

## 2019-12-29 DIAGNOSIS — N1831 Chronic kidney disease, stage 3a: Secondary | ICD-10-CM | POA: Insufficient documentation

## 2019-12-29 DIAGNOSIS — Z8249 Family history of ischemic heart disease and other diseases of the circulatory system: Secondary | ICD-10-CM | POA: Insufficient documentation

## 2019-12-29 DIAGNOSIS — R590 Localized enlarged lymph nodes: Secondary | ICD-10-CM | POA: Insufficient documentation

## 2019-12-29 DIAGNOSIS — Z806 Family history of leukemia: Secondary | ICD-10-CM | POA: Diagnosis not present

## 2019-12-29 DIAGNOSIS — D631 Anemia in chronic kidney disease: Secondary | ICD-10-CM | POA: Diagnosis not present

## 2019-12-29 DIAGNOSIS — Z5111 Encounter for antineoplastic chemotherapy: Secondary | ICD-10-CM | POA: Diagnosis present

## 2019-12-29 MED ORDER — HEPARIN SOD (PORK) LOCK FLUSH 100 UNIT/ML IV SOLN
500.0000 [IU] | Freq: Once | INTRAVENOUS | Status: AC | PRN
  Administered 2019-12-29: 500 [IU]
  Filled 2019-12-29: qty 5

## 2019-12-29 MED ORDER — HEPARIN SOD (PORK) LOCK FLUSH 100 UNIT/ML IV SOLN
INTRAVENOUS | Status: AC
  Filled 2019-12-29: qty 5

## 2019-12-29 MED ORDER — SODIUM CHLORIDE 0.9% FLUSH
10.0000 mL | INTRAVENOUS | Status: DC | PRN
  Administered 2019-12-29: 10 mL
  Filled 2019-12-29: qty 10

## 2019-12-29 NOTE — Telephone Encounter (Signed)
Received courtesy call from [facility]. Christopher Delacruz has been scheduled, and this was coordinated with his nephew, [name]. Appointment is at the [facility] location on 01/03/20 at 1100 with Dr. [name].

## 2019-12-29 NOTE — Telephone Encounter (Signed)
Thanks

## 2019-12-29 NOTE — Telephone Encounter (Signed)
Oral Oncology Pharmacist Encounter  Received new prescription for Xeloda (capecitabine) for the treatment of stage IIIC rectal cancer in conjunction with XRT, planned duration until the end of radiation.  CMP from 12/27/19 assessed, SCr elevated at 1.45mg /dL, CrCl ~69mL/min. Continue to monitor renal function if CrCl <47mL/min patient may require a dose reduction. Prescription dose and frequency assessed.   Current medication list in Epic reviewed, no relevant DDIs with capecitabine identified.   Evaluated chart and no patient barriers to medication adherence identified.   Oral Oncology Clinic will continue to follow for insurance authorization, copayment issues, initial counseling and start date.  Darl Pikes, PharmD, BCPS, BCOP, CPP Hematology/Oncology Clinical Pharmacist Practitioner [facility] [phone]  12/29/2019 9:14 AM

## 2019-12-29 NOTE — Telephone Encounter (Signed)
Oral Oncology Patient Advocate Encounter  After completing a benefits investigation, prior authorization for Xeloda (Capecitabine) is not required at this time through Medicare/Medicaid.  Patient's copay is $0.00.    [place] Patient [place] Phone [phone] Fax [phone] 12/29/2019 9:47 AM

## 2019-12-30 ENCOUNTER — Other Ambulatory Visit: Payer: Self-pay

## 2019-12-30 ENCOUNTER — Non-Acute Institutional Stay: Payer: Medicare Other | Admitting: Adult Health Nurse Practitioner

## 2019-12-30 DIAGNOSIS — C19 Malignant neoplasm of rectosigmoid junction: Secondary | ICD-10-CM

## 2019-12-30 DIAGNOSIS — Z515 Encounter for palliative care: Secondary | ICD-10-CM

## 2019-12-30 DIAGNOSIS — F0150 Vascular dementia without behavioral disturbance: Secondary | ICD-10-CM

## 2019-12-30 NOTE — Progress Notes (Addendum)
[place] Consult Note Telephone: [phone]  Fax: [phone]  PATIENT NAME: Christopher Delacruz DOB: 07/21/1950 MRN: [id number]  PRIMARY CARE PROVIDER:   Thea Gist NP [facility])  REFERRING PROVIDER:  Thea Gist NP [facility])  RESPONSIBLE PARTY:   Greene Diodato, nephew/HCPOA [phone]   Chief complaint: follow up palliative visit/assess for symptoms related to cancer treatment  RECOMMENDATIONS and PLAN:  1.  Advanced care planning.  Patient is full code. Called nephew to update on visit.  Left VM with reason for call and call back info  2. Colorectal cancer.  Tolerating treatment well.  Has follow up in 2 weeks for evaluation for cycle 8 of FOLFOX.  Continue follow up and recommendations by oncology.  3.  Dementia.  Patient is able to ambulate without assistive devices.  He does requiring cueing for ADLs but is fairly independent.  He is continent of B&B.  Able to feed himself with good appetite and no weight loss.  Current weight 231 pounds with BMI of 30.  No reports of anxiety/agitation. Continue supportive care at facility.  Palliative will continue to monitor for symptom management/decline and make recommendations as needed.  Will follow up in 8-10 weeks.     HISTORY OF PRESENT ILLNESS:  Christopher Delacruz is a 70 y.o. year old male with multiple medical problems including colorectal cancer, CKD stage 3, anemia,vascular dementia, h/o CVA with right side weakness. Palliative Care was asked to help address goals of care. Patient undergoing treatment with FOLFOX for colorectal cancer.  Tolerating treatment well.  10 point ROS asked and negative, though may be unreliable secondary to dementia.  No new concerns reported by staff today.  No falls, infection, or hospital visits since last visit.    Had reviewed oncology records and obtained history from facility staff.  CODE STATUS: full code  PPS: 50% HOSPICE ELIGIBILITY/DIAGNOSIS:  TBD  PHYSICAL EXAM:  HR 64  O2 96% on RA General: Patient sitting in chair in NAD Eyes: sclera anicteric and noninjected with no discharge noted Cardiovascular: regular rate and rhythm Pulmonary: lung sounds clear; normal respiratory effort Abdomen: soft, nontender, + bowel sounds Extremities: no edema, no joint deformities Skin: no rashes on exposed skin Neurological: Weakness; A&O to person and place  PAST MEDICAL HISTORY:  Past Medical History:  Diagnosis Date  . B12 deficiency 09/17/2019  . CVA (cerebral vascular accident) ([place])   . Diabetes mellitus ([place])   . Hyperlipidemia   . Hypertension     SOCIAL HX:  Social History   Tobacco Use  . Smoking status: Never Smoker  . Smokeless tobacco: Never Used  Substance Use Topics  . Alcohol use: Never    Alcohol/week: 0.0 standard drinks    ALLERGIES: No Known Allergies   PERTINENT MEDICATIONS:  Outpatient Encounter Medications as of 12/30/2019  Medication Sig  . amLODipine (NORVASC) 10 MG tablet Take 1 tablet (10 mg total) by mouth daily.  Marland Kitchen aspirin EC 81 MG EC tablet Take 1 tablet (81 mg total) by mouth daily. Swallow whole.  . clopidogrel (PLAVIX) 75 MG tablet Take 1 tablet (75 mg total) by mouth daily.  . ferrous sulfate 325 (65 FE) MG tablet Take 1 tablet (325 mg total) by mouth daily.  . finasteride (PROSCAR) 5 MG tablet Take 5 mg by mouth daily.  Marland Kitchen lidocaine-prilocaine (EMLA) cream Apply to affected area once  . multivitamin (ONE-A-DAY MEN'S) TABS tablet Take 1 tablet by mouth daily.  Marland Kitchen nystatin (MYCOSTATIN) 100000 UNIT/ML suspension  Take 5 mLs (500,000 Units total) by mouth 4 (four) times daily. Swish and spit  . ondansetron (ZOFRAN) 8 MG tablet Take 1 tablet (8 mg total) by mouth 2 (two) times daily as needed for refractory nausea / vomiting. Start on day 3 after chemotherapy.  . prochlorperazine (COMPAZINE) 10 MG tablet Take 1 tablet (10 mg total) by mouth every 6 (six) hours as needed (Nausea or vomiting).  .  vitamin B-12 (CYANOCOBALAMIN) 1000 MCG tablet Take 1,000 mcg by mouth daily.   No facility-administered encounter medications on file as of 12/30/2019.     Enis Riecke Jenetta Downer, NP

## 2020-01-03 ENCOUNTER — Ambulatory Visit: Payer: Self-pay | Admitting: Surgery

## 2020-01-03 ENCOUNTER — Encounter: Payer: Self-pay | Admitting: Surgery

## 2020-01-03 DIAGNOSIS — N183 Chronic kidney disease, stage 3 unspecified: Secondary | ICD-10-CM

## 2020-01-03 DIAGNOSIS — Z8673 Personal history of transient ischemic attack (TIA), and cerebral infarction without residual deficits: Secondary | ICD-10-CM

## 2020-01-03 DIAGNOSIS — G8191 Hemiplegia, unspecified affecting right dominant side: Secondary | ICD-10-CM

## 2020-01-05 ENCOUNTER — Ambulatory Visit: Payer: Medicare Other | Admitting: Radiation Oncology

## 2020-01-05 ENCOUNTER — Telehealth: Payer: Self-pay

## 2020-01-05 NOTE — Telephone Encounter (Signed)
NOTES ON FILE FROM DR STEVEN GROSS [phone], SENT REFERRAL TO SCHEDULING 

## 2020-01-10 ENCOUNTER — Inpatient Hospital Stay: Payer: Medicare Other

## 2020-01-10 ENCOUNTER — Inpatient Hospital Stay (HOSPITAL_BASED_OUTPATIENT_CLINIC_OR_DEPARTMENT_OTHER): Payer: Medicare Other | Admitting: Oncology

## 2020-01-10 ENCOUNTER — Encounter: Payer: Self-pay | Admitting: Oncology

## 2020-01-10 ENCOUNTER — Other Ambulatory Visit: Payer: Self-pay

## 2020-01-10 VITALS — BP 124/80 | HR 64 | Temp 97.9°F | Resp 18 | Wt 232.2 lb

## 2020-01-10 DIAGNOSIS — D631 Anemia in chronic kidney disease: Secondary | ICD-10-CM

## 2020-01-10 DIAGNOSIS — R4189 Other symptoms and signs involving cognitive functions and awareness: Secondary | ICD-10-CM

## 2020-01-10 DIAGNOSIS — Z5111 Encounter for antineoplastic chemotherapy: Secondary | ICD-10-CM | POA: Diagnosis not present

## 2020-01-10 DIAGNOSIS — C20 Malignant neoplasm of rectum: Secondary | ICD-10-CM

## 2020-01-10 DIAGNOSIS — E538 Deficiency of other specified B group vitamins: Secondary | ICD-10-CM

## 2020-01-10 DIAGNOSIS — N1832 Chronic kidney disease, stage 3b: Secondary | ICD-10-CM

## 2020-01-10 LAB — CBC WITH DIFFERENTIAL/PLATELET
Abs Immature Granulocytes: 0.02 10*3/uL (ref 0.00–0.07)
Basophils Absolute: 0.1 10*3/uL (ref 0.0–0.1)
Basophils Relative: 1 %
Eosinophils Absolute: 0.1 10*3/uL (ref 0.0–0.5)
Eosinophils Relative: 2 %
HCT: 36.6 % — ABNORMAL LOW (ref 39.0–52.0)
Hemoglobin: 11.8 g/dL — ABNORMAL LOW (ref 13.0–17.0)
Immature Granulocytes: 0 %
Lymphocytes Relative: 21 %
Lymphs Abs: 1.8 10*3/uL (ref 0.7–4.0)
MCH: 28.8 pg (ref 26.0–34.0)
MCHC: 32.2 g/dL (ref 30.0–36.0)
MCV: 89.3 fL (ref 80.0–100.0)
Monocytes Absolute: 0.9 10*3/uL (ref 0.1–1.0)
Monocytes Relative: 10 %
Neutro Abs: 5.7 10*3/uL (ref 1.7–7.7)
Neutrophils Relative %: 66 %
Platelets: 247 10*3/uL (ref 150–400)
RBC: 4.1 MIL/uL — ABNORMAL LOW (ref 4.22–5.81)
RDW: 14.3 % (ref 11.5–15.5)
WBC: 8.6 10*3/uL (ref 4.0–10.5)
nRBC: 0 % (ref 0.0–0.2)

## 2020-01-10 LAB — COMPREHENSIVE METABOLIC PANEL
ALT: 13 U/L (ref 0–44)
AST: 21 U/L (ref 15–41)
Albumin: 3.5 g/dL (ref 3.5–5.0)
Alkaline Phosphatase: 53 U/L (ref 38–126)
Anion gap: 10 (ref 5–15)
BUN: 16 mg/dL (ref 8–23)
CO2: 26 mmol/L (ref 22–32)
Calcium: 8.9 mg/dL (ref 8.9–10.3)
Chloride: 105 mmol/L (ref 98–111)
Creatinine, Ser: 1.74 mg/dL — ABNORMAL HIGH (ref 0.61–1.24)
GFR, Estimated: 42 mL/min — ABNORMAL LOW (ref >60)
Glucose, Bld: 117 mg/dL — ABNORMAL HIGH (ref 70–99)
Potassium: 3.9 mmol/L (ref 3.5–5.1)
Sodium: 141 mmol/L (ref 135–145)
Total Bilirubin: 0.7 mg/dL (ref 0.3–1.2)
Total Protein: 6.6 g/dL (ref 6.5–8.1)

## 2020-01-10 MED ORDER — SODIUM CHLORIDE 0.9 % IV SOLN
Freq: Once | INTRAVENOUS | Status: AC
  Administered 2020-01-10: 10:00:00 1000 mL via INTRAVENOUS
  Filled 2020-01-10: qty 250

## 2020-01-10 MED ORDER — DEXTROSE 5 % IV SOLN
Freq: Once | INTRAVENOUS | Status: AC
  Filled 2020-01-10: qty 250

## 2020-01-10 MED ORDER — LEUCOVORIN CALCIUM INJECTION 350 MG
420.0000 mg/m2 | Freq: Once | INTRAMUSCULAR | Status: AC
  Administered 2020-01-10: 12:00:00 950 mg via INTRAVENOUS
  Filled 2020-01-10: qty 47.5

## 2020-01-10 MED ORDER — SODIUM CHLORIDE 0.9 % IV SOLN
2400.0000 mg/m2 | INTRAVENOUS | Status: AC
  Administered 2020-01-10: 14:00:00 5400 mg via INTRAVENOUS
  Filled 2020-01-10: qty 108

## 2020-01-10 MED ORDER — OXALIPLATIN CHEMO INJECTION 100 MG/20ML
70.0000 mg/m2 | Freq: Once | INTRAVENOUS | Status: AC
  Administered 2020-01-10: 12:00:00 160 mg via INTRAVENOUS
  Filled 2020-01-10: qty 32

## 2020-01-10 MED ORDER — PALONOSETRON HCL INJECTION 0.25 MG/5ML
0.2500 mg | Freq: Once | INTRAVENOUS | Status: AC
  Administered 2020-01-10: 11:00:00 0.25 mg via INTRAVENOUS
  Filled 2020-01-10: qty 5

## 2020-01-10 MED ORDER — SODIUM CHLORIDE 0.9 % IV SOLN
10.0000 mg | Freq: Once | INTRAVENOUS | Status: AC
  Administered 2020-01-10: 11:00:00 10 mg via INTRAVENOUS
  Filled 2020-01-10: qty 10

## 2020-01-10 MED ORDER — SODIUM CHLORIDE 0.9% FLUSH
10.0000 mL | INTRAVENOUS | Status: DC | PRN
  Administered 2020-01-10 (×2): 10 mL
  Filled 2020-01-10: qty 10

## 2020-01-10 NOTE — Progress Notes (Signed)
1408- Patient tolerated treatment well. Patient stable and discharged to home with Fluorouracil Infusion Pump in place.

## 2020-01-10 NOTE — Progress Notes (Signed)
DISCONTINUE ON PATHWAY REGIMEN - Colorectal     A cycle is every 14 days:     Oxaliplatin      Leucovorin      Fluorouracil      Fluorouracil   **Always confirm dose/schedule in your pharmacy ordering system**  REASON: Continuation Of Treatment PRIOR TREATMENT: ROS56: mFOLFOX6 q14 Days x 4 Months TREATMENT RESPONSE: Stable Disease (SD)  START ON PATHWAY REGIMEN - Colorectal     Administer Monday through Friday:     Capecitabine   **Always confirm dose/schedule in your pharmacy ordering system**  Patient Characteristics: Preoperative or Nonsurgical Candidate (Clinical Staging), Rectal, cT3 - cT4, cN0 or Any cT, cN+ Tumor Location: Rectal Therapeutic Status: Preoperative or Nonsurgical Candidate (Clinical Staging) AJCC T Category: cT4b AJCC N Category: cN2 AJCC M Category: cM0 AJCC 8 Stage Grouping: IIIC Intent of Therapy: Curative Intent, Discussed with Patient

## 2020-01-10 NOTE — Progress Notes (Signed)
Hematology/Oncology Follow Up Note [place]  [phone] [phone]  Patient Care Team: [name], MD as PCP - General (Family Medicine) Borders, Kirt Boys, NP as Nurse Practitioner (Hospice and Palliative Medicine) [name], MD as Consulting Physician (Oncology) [name], MD as Referring Physician (Radiation Oncology) [name], NP as Nurse Practitioner [name], RN as Oncology Nurse Navigator [name], MD as Consulting Physician (Neurology) [name], MD as Consulting Physician (Gastroenterology) [name], MD as Consulting Physician (Psychiatry) [name], MD as Consulting Physician (Colon and Rectal Surgery)   Name of the patient: Christopher Delacruz  [id number]  [birth year]   REASON FOR VISIT  follow-up for rectal cancer  PERTINENT ONCOLOGY HISTORY Christopher Delacruz is a 69 y.o.[place] who has above oncology history reviewed by me today presented for follow up visit for management of locally advanced rectal cancer. -Initially presented to emergency room due to profound anemia. 09/11/2019 EGD showed duodenal erosions without bleeding 09/11/2019 colonoscopy preparation was poor.  Likely malignant partially obstructing tumor in the rectosigmoid colon area which was biopsied. 09/13/2019, repeat colonoscopy showed 12 mm polyps were found in the descending colon.  Polyp was sessile.  Polyp was resected and retrieved.  A polypoid ulcerated partially obstructing large mass was found from 10 to 20 cm proximal to the anus.  The mass was circumferential, measured 10 cm in length.  In addition the diameter measures 12 mm.  Oozing was present 09/11/2019, biopsy showed invasive colorectal adenocarcinoma.  Moderately differentiated. 09/13/2019, CT chest abdomen pelvis showed rectal wall mass with evidence of local invasion to the mesorectal fat and potential involvement of the right seminal vesicle.  Multiple  enlarged lymph nodes.  Small lung nodules 2 to 3 mm in the lung bilaterally.  Highly nonspecific.  Cannot rule out metastasis.  09/14/2019 MRI pelvis showed T4b N2 rectal cancer. 09/15/2019 MRI brain with and without contrast showed punctate focus of restricted diffusion most likely reflects acute or early subacute small vessel infarct.  No evidence of intracranial metastasis.  Multiple chronic infarct.  #Iron deficiency anemia, patient received PRBC transfusion and IV Venofer treatments. #Vitamin B12 deficiency, status post parenteral vitamin B12 injections during hospitalization.  Currently on vitamin B12 supplementation. #Patient was found to have very poor insight to his condition.  Was seen by psychiatrist and was deemed incompetent.  Patient's medical power of attorney is his nephew Christopher Delacruz.  #Mediport was placed by Dr. Dahlia Byes  # #H. pylori gastritis, patient finishes course of antibiotics and PPI # 09/26/2019 started on chemotherapy with FOLFOX.  INTERVA L HISTORY 69 y.o. patient presents for follow-up of rectal cancer..  Patient was accompanied by his nephew Christopher Delacruz.  Patient has no new complaints.  He is a poor historian. Christopher Delacruz reports that patient may have had bowel movement accidents during the interval.   Review of Systems  Unable to perform ROS: Other (Cognitive impairment)  Constitutional: Negative for fatigue.  Respiratory: Negative for shortness of breath.   Gastrointestinal: Negative for abdominal pain, blood in stool and constipation.      No Known Allergies   Past Medical History:  Diagnosis Date  . Acute renal failure superimposed on stage 3a chronic kidney disease ([place]) 09/07/2019  . B12 deficiency 09/17/2019  . CVA (cerebral vascular accident) ([place])   . Diabetes mellitus ([place])   . Hyperlipidemia   . Hypertension   . Right hemiparesis ([place]) 2017   Associated with stroke     Past Surgical History:  Procedure  Laterality Date  . COLONOSCOPY N/A 09/11/2019    Procedure: COLONOSCOPY;  Surgeon: [name], MD;  Location[facility] ENDOSCOPY;  Service: Endoscopy;  Laterality: N/A;  . COLONOSCOPY WITH PROPOFOL N/A 09/12/2019   Procedure: COLONOSCOPY WITH PROPOFOL;  Surgeon: [name], MD;  Location: [facility] ENDOSCOPY;  Service: Gastroenterology;  Laterality: N/A;  . COLONOSCOPY WITH PROPOFOL N/A 09/13/2019   Procedure: COLONOSCOPY WITH PROPOFOL;  Surgeon: [name], MD;  Location: [facility] ENDOSCOPY;  Service: Gastroenterology;  Laterality: N/A;  . ESOPHAGOGASTRODUODENOSCOPY N/A 09/11/2019   Procedure: ESOPHAGOGASTRODUODENOSCOPY (EGD);  Surgeon: [name], MD;  Location[facility] ENDOSCOPY;  Service: Endoscopy;  Laterality: N/A;  . EXPLORE EYE SOCKET    . PORTACATH PLACEMENT N/A 09/15/2019   Procedure: INSERTION PORT-A-CATH;  Surgeon: Jules Husbands, MD;  Location: [facility] ORS;  Service: General;  Laterality: N/A;    Social History   Socioeconomic History  . Marital status: Divorced    Spouse name: Not on file  . Number of children: Not on file  . Years of education: Not on file  . Highest education level: Not on file  Occupational History  . 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 Smoking status: Never Smoker  . Smokeless tobacco: Never Used  Vaping Use  . Vaping Use: Never used  Substance and Sexual Activity  . Alcohol use: Never    Alcohol/week: 0.0 standard drinks  . Drug use: Never  . Sexual activity: Not on file  Other Topics Concern  . Not on file  Social History Narrative   Nephew with [place] due to cognitive impairment of patient after stroke 2017      Lives in [place] in [place], [place]   Social Determinants of Health   Financial Resource Strain: Not on file  Food Insecurity: Not on file  Transportation Needs: Unmet Transportation Needs  . Lack of Transportation (Medical): Yes  . Lack of Transportation (Non-Medical): Yes   Physical Activity: Not on file  Stress: Not on file  Social Connections: Not on file  Intimate Partner Violence: Not on file    Family History  Problem Relation Age of Onset  . Heart attack Sister   . Leukemia Brother   . Stroke Brother      Current Outpatient Medications:  .  amLODipine (NORVASC) 10 MG tablet, Take 1 tablet (10 mg total) by mouth daily., Disp: 30 tablet, Rfl: 1 .  aspirin EC 81 MG EC tablet, Take 1 tablet (81 mg total) by mouth daily. Swallow whole., Disp: 30 tablet, Rfl: 11 .  clopidogrel (PLAVIX) 75 MG tablet, Take 1 tablet (75 mg total) by mouth daily., Disp: 30 tablet, Rfl: 1 .  ferrous sulfate 325 (65 FE) MG tablet, Take 1 tablet (325 mg total) by mouth daily., Disp: 30 tablet, Rfl: 3 .  finasteride (PROSCAR) 5 MG tablet, Take 5 mg by mouth daily., Disp: , Rfl:  .  lidocaine-prilocaine (EMLA) cream, Apply to affected area once, Disp: 30 g, Rfl: 3 .  multivitamin (ONE-A-DAY MEN'S) TABS tablet, Take 1 tablet by mouth daily., Disp: , Rfl:  .  nystatin (MYCOSTATIN) 100000 UNIT/ML suspension, Take 5 mLs (500,000 Units total) by mouth 4 (four) times daily. Swish and spit, Disp: 473 mL, Rfl: 0 .  ondansetron (ZOFRAN) 8 MG tablet, Take 1 tablet (8 mg total) by mouth 2 (two) times daily as needed for refractory nausea /  vomiting. Start on day 3 after chemotherapy., Disp: 30 tablet, Rfl: 1 .  prochlorperazine (COMPAZINE) 10 MG tablet, Take 1 tablet (10 mg total) by mouth every 6 (six) hours as needed (Nausea or vomiting)., Disp: 30 tablet, Rfl: 1 .  vitamin B-12 (CYANOCOBALAMIN) 1000 MCG tablet, Take 1,000 mcg by mouth daily., Disp: , Rfl:  No current facility-administered medications for this visit.  Facility-Administered Medications Ordered in Other Visits:  .  fluorouracil (ADRUCIL) 5,400 mg in sodium chloride 0.9 % 142 mL chemo infusion, 2,400 mg/m2 (Treatment Plan Recorded), Intravenous, 1 day or 1 dose, [name], MD .  leucovorin 950 mg in dextrose 5 % 250 mL  infusion, 420 mg/m2 (Treatment Plan Recorded), Intravenous, Once, [name], MD, Last Rate: 149 mL/hr at 01/10/20 1142, 950 mg at 01/10/20 1142 .  oxaliplatin (ELOXATIN) 160 mg in dextrose 5 % 500 mL chemo infusion, 70 mg/m2 (Treatment Plan Recorded), Intravenous, Once, [name], MD, Last Rate: 266 mL/hr at 01/10/20 1141, 160 mg at 01/10/20 1141 .  sodium chloride flush (NS) 0.9 % injection 10 mL, 10 mL, Intracatheter, PRN, [name], MD, 10 mL at 01/10/20 0934  Physical exam:  Vitals:   01/10/20 0848  BP: 124/80  Pulse: 64  Resp: 18  Temp: 97.9 F (36.6 C)  Weight: 232 lb 3.2 oz (105.3 kg)   Physical Exam Constitutional:      General: He is not in acute distress.    Comments: Patient sits in the wheelchair.  HENT:     Head: Normocephalic and atraumatic.  Eyes:     General: No scleral icterus. Cardiovascular:     Rate and Rhythm: Normal rate and regular rhythm.     Heart sounds: Normal heart sounds.  Pulmonary:     Effort: Pulmonary effort is normal. No respiratory distress.     Breath sounds: No wheezing.  Abdominal:     General: Bowel sounds are normal. There is no distension.     Palpations: Abdomen is soft.  Musculoskeletal:        General: No deformity. Normal range of motion.     Cervical back: Normal range of motion and neck supple.  Skin:    General: Skin is warm and dry.     Findings: No erythema or rash.  Neurological:     Mental Status: He is alert. Mental status is at baseline.     Cranial Nerves: No cranial nerve deficit.     Coordination: Coordination normal.     Comments: Orientated x2  Psychiatric:        Mood and Affect: Mood normal.     CMP Latest Ref Rng & Units 01/10/2020  Glucose 70 - 99 mg/dL 117(H)  BUN 8 - 23 mg/dL 16  Creatinine 0.61 - 1.24 mg/dL 1.74(H)  Sodium 135 - 145 mmol/L 141  Potassium 3.5 - 5.1 mmol/L 3.9  Chloride 98 - 111 mmol/L 105  CO2 22 - 32 mmol/L 26  Calcium 8.9 - 10.3 mg/dL 8.9  Total Protein 6.5 - 8.1 g/dL 6.6  Total  Bilirubin 0.3 - 1.2 mg/dL 0.7  Alkaline Phos 38 - 126 U/L 53  AST 15 - 41 U/L 21  ALT 0 - 44 U/L 13   CBC Latest Ref Rng & Units 01/10/2020  WBC 4.0 - 10.5 K/uL 8.6  Hemoglobin 13.0 - 17.0 g/dL 11.8(L)  Hematocrit 39.0 - 52.0 % 36.6(L)  Platelets 150 - 400 K/uL 247    RADIOGRAPHIC STUDIES: I have personally reviewed the radiological images  as listed and agreed with the findings in the report. No results found.   Assessment and plan 1. Rectal cancer ([place])   2. Encounter for antineoplastic chemotherapy   3. B12 deficiency   4. Cognitive impairment   5. Anemia in stage 3b chronic kidney disease ([place])   Cancer Staging Rectal cancer [facility]) Staging form: Colon and Rectum, AJCC 8th Edition - Clinical stage from 09/22/2019: Stage IIIC (cT4b, cN2, cM0) - Signed by [name], MD on 09/22/2019  Stage IIIC rectal cancer cT4 N2 M0 rectal cancer Patient has been doing very well clinically. Labs reviewed and discussed with patient. Recommend patient to proceed with cycle 8 FOLFOX.  Dose reduced oxaliplatin to 70 mg/m due to kidney function and possible diarrhea.   #Chronic kidney disease, creatinine has increased comparing to last visit. Encourage oral hydration.  He will get 1 L of IV fluid today. ?  Due to possible diarrhea.  #Plan to obtain interval CT chest abdomen pelvis for evaluation of treatment response. I discussed with patient and remark about future plans about concurrent chemotherapy with Xeloda (-1535m BID- dose reduce due to CKD) and radiation.  Rationale and potential side effects were discussed with them.  He and MElta Guadeloupeagree with the plan. Patient nursing nursing facility.  Nephew prefers the Dilaudid to be sent to him so that patient will not be started on treatment continue radiation. [facility]I discussed with surgeon with in basket messages.  Anemia, hemoglobin 11.8. Vitamin B12 deficiency,  continue B12 supplementation. 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  Continue monitor.  We spent sufficient time to discuss many aspect of care, questions were answered to patient's satisfaction. Patient has appointment with Dr. CBaruch Goutythis week.  I will see patient on his first day of radiation.   ZEarlie Server MD, PhD Hematology Oncology CM[facility] & Healthcare Centersat AKindred Hospital - Santa AnaPager- [phone]/14/2021

## 2020-01-10 NOTE — Progress Notes (Signed)
Patient denies new problems/concerns today.   °

## 2020-01-11 LAB — CEA: CEA: 7.7 ng/mL — ABNORMAL HIGH (ref 0.0–4.7)

## 2020-01-12 ENCOUNTER — Other Ambulatory Visit: Payer: Self-pay

## 2020-01-12 ENCOUNTER — Encounter: Payer: Self-pay | Admitting: Radiation Oncology

## 2020-01-12 ENCOUNTER — Inpatient Hospital Stay: Payer: Medicare Other

## 2020-01-12 ENCOUNTER — Ambulatory Visit
Admission: RE | Admit: 2020-01-12 | Discharge: 2020-01-12 | Disposition: A | Discharge status: Home / Self Care | Payer: Medicare Other | Source: Ambulatory Visit | Attending: Radiation Oncology | Admitting: Radiation Oncology

## 2020-01-12 VITALS — BP 173/61 | HR 50 | Temp 97.4°F | Resp 16 | Wt 232.9 lb

## 2020-01-12 DIAGNOSIS — R197 Diarrhea, unspecified: Secondary | ICD-10-CM | POA: Insufficient documentation

## 2020-01-12 DIAGNOSIS — C20 Malignant neoplasm of rectum: Secondary | ICD-10-CM

## 2020-01-12 DIAGNOSIS — Z5111 Encounter for antineoplastic chemotherapy: Secondary | ICD-10-CM | POA: Diagnosis not present

## 2020-01-12 MED ORDER — SODIUM CHLORIDE 0.9% FLUSH
10.0000 mL | INTRAVENOUS | Status: DC | PRN
  Administered 2020-01-12: 13:00:00 10 mL
  Filled 2020-01-12: qty 10

## 2020-01-12 MED ORDER — HEPARIN SOD (PORK) LOCK FLUSH 100 UNIT/ML IV SOLN
500.0000 [IU] | Freq: Once | INTRAVENOUS | Status: AC | PRN
  Administered 2020-01-12: 13:00:00 500 [IU]
  Filled 2020-01-12: qty 5

## 2020-01-12 NOTE — Progress Notes (Signed)
Pt stable at discharge.  

## 2020-01-12 NOTE — Progress Notes (Signed)
Radiation Oncology Follow up Note  Name: Christopher Delacruz   Date:   01/12/2020 MRN:  [id number] DOB: [birth year]    This 69 y.o. male presents to the clinic today for reevaluation to start concurrent chemoradiation therapy for stage III (T4b N2 M0) adenocarcinoma the rectum.  REFERRING PROVIDER: [name], MD  HPI: Patient is a 69 year old male former employee of [employer] who originally consulted back in September 2021 when he presented with stage III adenocarcinoma of the rectum.Marland Kitchen  He has been undergoing TNT chemotherapy in the neoadjuvant fashion which she has tolerated fairly well.  He has completed 8 cycles of FOLFOX chemotherapy.  He has had some oxaliplatin reduced secondary to kidney function and diarrhea.  Patient has a CT scan scheduled for the end of this month.  He states he is been having some intermittent diarrhea although it is improving.  No increased lower urinary tract symptoms.  No blood per rectum.  COMPLICATIONS OF TREATMENT: none  FOLLOW UP COMPLIANCE: keeps appointments   PHYSICAL EXAM:  BP (!) 173/61 (BP Location: Left Arm, Patient Position: Sitting)   Pulse (!) 50   Temp (!) 97.4 F (36.3 C) (Tympanic)   Resp 16   Wt 232 lb 14.4 oz (105.6 kg)   BMI 30.21 kg/m  Well-developed well-nourished patient in NAD. HEENT reveals PERLA, EOMI, discs not visualized.  Oral cavity is clear. No oral mucosal lesions are identified. Neck is clear without evidence of cervical or supraclavicular adenopathy. Lungs are clear to A&P. Cardiac examination is essentially unremarkable with regular rate and rhythm without murmur rub or thrill. Abdomen is benign with no organomegaly or masses noted. Motor sensory and DTR levels are equal and symmetric in the upper and lower extremities. Cranial nerves II through XII are grossly intact. Proprioception is intact. No peripheral adenopathy or edema is identified. No motor or sensory levels are noted. Crude visual fields are within normal  range.  RADIOLOGY RESULTS: CT scans reviewed as well as MRI of pelvis.  We will review his CT scan scheduled for 23 December.  PLAN: At this like time would like to go ahead with radiation therapy to his whole pelvis.  We will plan on delivering 45 Gray over 5 weeks boosting the area of tumor involved another 540 cGy.  Risks and benefits of treatment including increased possible diarrhea fatigue alteration of blood count skin reaction all were reviewed with the patient.  I have set him up for CT simulation after Christmas.  Allow him to recover somewhat from his chemotherapy.  Patient and his nephew both comprehend my treatment plan well.  There will be extra effort by both professional staff as well as technical staff to coordinate and manage concurrent chemoradiation and ensuing side effects during his treatments.  Patient will be receiving oral Xeloda during his course of treatment   I would like to take this opportunity to thank you for allowing me to participate in the care of your patient.[name], MD

## 2020-01-13 ENCOUNTER — Telehealth: Payer: Self-pay | Admitting: *Deleted

## 2020-01-13 NOTE — Telephone Encounter (Signed)
   [facility] Pre-operative Risk Assessment    HEARTCARE STAFF: - Please ensure there is not already an duplicate clearance open for this procedure. - Under Visit Info/Reason for Call, type in Other and utilize the format Clearance MM/DD/YY or Clearance TBD. Do not use dashes or single digits. - If request is for dental extraction, please clarify the # of teeth to be extracted.  Request for surgical clearance: PT HAS NEW PT APPT 01/30/20 WITH DR. CHANDRASEKHAR  1. What type of surgery is being performed? ROBOTIC LOW ANTERIOR RECTOSIGMOID RESECTION   2. When is this surgery scheduled? TBD   3. What type of clearance is required (medical clearance vs. Pharmacy clearance to hold med vs. Both)? MEDICAL  4. Are there any medications that need to be held prior to surgery and how long? PLAVIX    5. Practice name and name of physician performing surgery? [facility]; DR. Remo Lipps GROSS   6. What is the office phone number? [phone]   7.   What is the office fax number? [place], CMA  8.   Anesthesia type (None, local, MAC, general) ? GENERAL   Julaine Hua 01/13/2020, 9:36 AM  _________________________________________________________________   (provider comments below)

## 2020-01-16 ENCOUNTER — Other Ambulatory Visit (HOSPITAL_COMMUNITY): Payer: Self-pay | Admitting: Oncology

## 2020-01-16 MED ORDER — CAPECITABINE 500 MG PO TABS: 625.0000 mg/m2 | ORAL_TABLET | Freq: Two times a day (BID) | ORAL | 0 refills | Status: DC

## 2020-01-16 NOTE — Addendum Note (Signed)
Addended by: Darl Pikes on: 01/16/2020 04:30 PM   Modules accepted: Orders

## 2020-01-17 MED FILL — XELODA 500 MG TABLET: 500 | 28 days supply | Qty: 120 | Fill #0

## 2020-01-17 NOTE — Telephone Encounter (Signed)
Oral Oncology Patient Advocate Encounter  I spoke with patient's nephew, [name], this morning to set up delivery of Xeloda.  Address verified for shipment.  Xeloda will be filled through [facility] and mailed 01/17/20 for delivery 01/18/20.    [place] will call 7-10 days before next refill is due to complete adherence call and set up delivery of medication.     [place] Patient [place] Phone [phone] Fax [phone] 01/17/2020 11:05 AM

## 2020-01-19 ENCOUNTER — Other Ambulatory Visit: Payer: Self-pay | Admitting: Oncology

## 2020-01-19 ENCOUNTER — Telehealth: Payer: Self-pay

## 2020-01-19 ENCOUNTER — Ambulatory Visit
Admission: RE | Admit: 2020-01-19 | Discharge: 2020-01-19 | Disposition: A | Discharge status: Home / Self Care | Payer: Medicare Other | Source: Ambulatory Visit | Attending: Oncology | Admitting: Oncology

## 2020-01-19 ENCOUNTER — Other Ambulatory Visit: Payer: Self-pay

## 2020-01-19 DIAGNOSIS — C20 Malignant neoplasm of rectum: Secondary | ICD-10-CM | POA: Diagnosis present

## 2020-01-19 MED ORDER — IOHEXOL 300 MG/ML  SOLN
80.0000 mL | Freq: Once | INTRAMUSCULAR | Status: AC | PRN
  Administered 2020-01-19: 10:00:00 80 mL via INTRAVENOUS

## 2020-01-19 NOTE — Telephone Encounter (Signed)
-----   Message from Darl Pikes, [place] sent at 01/19/2020  1:03 PM EST ----- Bethena Roys spoke with [name] and set-up medication delivery. She reminded him hold on starting until the start of radiation. I will see [name] and Dawsyn on 12/28 following CT sim for Xeloda education.  -Clearnce Sorrel  ----- Message ----- From: [name], MD Sent: 01/19/2020  12:43 PM EST To: Evelina Dun, RN, Vanice Sarah, CMA, #  CT result showed stable disease.  Plan is to proceed with concurrent xeloda with RT. Looks like he is going to start on 1/3. Please send Rx to his nephew Mark/POA.

## 2020-01-19 NOTE — Telephone Encounter (Signed)
Please schedule patient for lab/MD on 1/3 (cbc,cmp. 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 Please inform nephew [name] and [employer] [facility] [phone]) of appt details.

## 2020-01-23 NOTE — Telephone Encounter (Signed)
Please see scheduling request on this pt.

## 2020-01-23 NOTE — Telephone Encounter (Addendum)
Patient tentatively to start XRT on 02/02/19 with dry run on 02/01/19.  Patient has been scheduled to see MD on 1/6 (as MD request).

## 2020-01-24 ENCOUNTER — Ambulatory Visit: Payer: Medicare Other | Attending: Radiation Oncology

## 2020-01-24 ENCOUNTER — Inpatient Hospital Stay: Payer: Medicare Other | Admitting: Pharmacist

## 2020-01-24 ENCOUNTER — Other Ambulatory Visit: Payer: Self-pay

## 2020-01-24 DIAGNOSIS — C20 Malignant neoplasm of rectum: Secondary | ICD-10-CM | POA: Insufficient documentation

## 2020-01-24 NOTE — Progress Notes (Signed)
Christopher Delacruz  [phone] Fax:([phone]) [phone]  Patient Care Team: [name], MD as PCP - General (Family Medicine) Borders, Kirt Boys, NP as Nurse Practitioner (Hospice and Palliative Medicine) [name], MD as Consulting Physician (Oncology) [name], MD as Referring Physician (Radiation Oncology) [name], NP as Nurse Practitioner [name], RN as Oncology Nurse Navigator [name], MD as Consulting Physician (Neurology) [name], MD as Consulting Physician (Gastroenterology) [name], MD as Consulting Physician (Psychiatry) [name], MD as Consulting Physician (Colon and Rectal Surgery)   Name of the patient: Christopher Delacruz  [id number]  01-Dec-1950   Date of visit: 01/24/20  HPI: Patient is a 69 y.o. male with stage IIIC rectal cancer. Patient preparing for treatment with Xeloda (capecitabine) and radiation  Reason for Consult: Xeloda (capecitabine) oral chemotherapy education with patient's caregiver Christopher Delacruz (nephew).   PAST MEDICAL HISTORY: Past Medical History:  Diagnosis Date  . Acute renal failure superimposed on stage 3a chronic kidney disease ([place]) 09/07/2019  . B12 deficiency 09/17/2019  . Cancer ([place])   . CVA (cerebral vascular accident) ([place])   . Diabetes mellitus ([place])   . Hyperlipidemia   . Hypertension   . Right hemiparesis ([place]) 2017   Associated with stroke    PAST SURGICAL HISTORY:  Past Surgical History:  Procedure Laterality Date  . COLONOSCOPY N/A 09/11/2019   Procedure: COLONOSCOPY;  Surgeon: [name], MD;  Location: [facility] ENDOSCOPY;  Service: Endoscopy;  Laterality: N/A;  . COLONOSCOPY WITH PROPOFOL N/A 09/12/2019   Procedure: COLONOSCOPY WITH PROPOFOL;  Surgeon: [name], MD;  Location: [facility] ENDOSCOPY;  Service: Gastroenterology;  Laterality: N/A;  . COLONOSCOPY WITH PROPOFOL N/A 09/13/2019   Procedure: COLONOSCOPY WITH PROPOFOL;   Surgeon: [name], MD;  Location[facility] ENDOSCOPY;  Service: Gastroenterology;  Laterality: N/A;  . ESOPHAGOGASTRODUODENOSCOPY N/A 09/11/2019   Procedure: ESOPHAGOGASTRODUODENOSCOPY (EGD);  Surgeon: [name], MD;  Location: [facility] ENDOSCOPY;  Service: Endoscopy;  Laterality: N/A;  . EXPLORE EYE SOCKET    . PORTACATH PLACEMENT N/A 09/15/2019   Procedure: INSERTION PORT-A-CATH;  Surgeon: Jules Husbands, MD;  Location: [facility] ORS;  Service: General;  Laterality: N/A;    HEMATOLOGY/ONCOLOGY HISTORY:  Oncology History  Rectal cancer ([place])  09/14/2019 Initial Diagnosis   Rectal cancer ([place])   09/22/2019 Cancer Staging   Staging form: Colon and Rectum, AJCC 8th Edition - Clinical stage from 09/22/2019: Stage IIIC (cT4b, cN2, cM0) - Signed by [name], MD on 09/22/2019   09/27/2019 - 01/12/2020 Chemotherapy   The patient had palonosetron (ALOXI) injection 0.25 mg, 0.25 mg, Intravenous,  Once, 8 of 8 cycles Administration: 0.25 mg (09/27/2019), 0.25 mg (10/11/2019), 0.25 mg (10/25/2019), 0.25 mg (11/08/2019), 0.25 mg (11/22/2019), 0.25 mg (12/06/2019), 0.25 mg (12/27/2019), 0.25 mg (01/10/2020) leucovorin 900 mg in dextrose 5 % 250 mL infusion, 398 mg/m2 = 904 mg, Intravenous,  Once, 8 of 8 cycles Administration: 900 mg (09/27/2019), 900 mg (10/11/2019), 900 mg (10/25/2019), 900 mg (11/08/2019), 900 mg (11/22/2019), 950 mg (01/10/2020) oxaliplatin (ELOXATIN) 160 mg in dextrose 5 % 500 mL chemo infusion, 70 mg/m2 = 160 mg (100 % of original dose 70 mg/m2), Intravenous,  Once, 8 of 8 cycles Dose modification: 70 mg/m2 (original dose 70 mg/m2, Cycle 1, Reason: Provider Judgment), 75 mg/m2 (original dose 70 mg/m2, Cycle 2, Reason: Provider Judgment), 80 mg/m2 (original dose 70 mg/m2, Cycle 2, Reason: Provider Judgment), 70 mg/m2 (original dose 70 mg/m2, Cycle 8, Reason: Other (see comments), Comment: possible  diarrhea) Administration: 160 mg (09/27/2019), 180 mg (10/11/2019), 180 mg (10/25/2019), 180 mg (11/08/2019),  180 mg (11/22/2019), 180 mg (12/06/2019), 180 mg (12/27/2019), 160 mg (01/10/2020) fluorouracil (ADRUCIL) 5,400 mg in sodium chloride 0.9 % 142 mL chemo infusion, 2,400 mg/m2 = 5,400 mg, Intravenous, 1 Day/Dose, 8 of 8 cycles Administration: 5,400 mg (09/27/2019), 5,400 mg (10/11/2019), 5,400 mg (10/25/2019), 5,400 mg (11/08/2019), 5,400 mg (11/22/2019), 5,400 mg (12/06/2019), 5,400 mg (12/27/2019), 5,400 mg (01/10/2020)  for chemotherapy treatment.    01/16/2020 -  Chemotherapy   The patient had capecitabine (XELODA) 500 MG tablet, 625 mg/m2 = 1,500 mg (100 % of original dose 625 mg/m2), Oral, 2 times daily after meals, 0 of 1 cycle, Start date: 01/16/2020, End date: -- Dose modification: 625 mg/m2 (original dose 625 mg/m2, Cycle 1)  for chemotherapy treatment.      ALLERGIES:  has No Known Allergies.  MEDICATIONS:  Current Outpatient Medications  Medication Sig Dispense Refill  . amLODipine (NORVASC) 10 MG tablet Take 1 tablet (10 mg total) by mouth daily. 30 tablet 1  . aspirin EC 81 MG EC tablet Take 1 tablet (81 mg total) by mouth daily. Swallow whole. 30 tablet 11  . capecitabine (XELODA) 500 MG tablet Take 3 tablets (1,500 mg total) by mouth 2 (two) times daily after a meal. Take Monday through Friday during radiation. 150 tablet 0  . clopidogrel (PLAVIX) 75 MG tablet Take 1 tablet (75 mg total) by mouth daily. 30 tablet 1  . ferrous sulfate 325 (65 FE) MG tablet Take 1 tablet (325 mg total) by mouth daily. 30 tablet 3  . finasteride (PROSCAR) 5 MG tablet Take 5 mg by mouth daily.    Marland Kitchen lidocaine-prilocaine (EMLA) cream Apply to affected area once 30 g 3  . multivitamin (ONE-A-DAY MEN'S) TABS tablet Take 1 tablet by mouth daily.    Marland Kitchen nystatin (MYCOSTATIN) 100000 UNIT/ML suspension Take 5 mLs (500,000 Units total) by mouth 4 (four) times daily. Swish and spit 473 mL 0  . ondansetron (ZOFRAN) 8 MG tablet Take 1 tablet (8 mg total) by mouth 2 (two) times daily as needed for refractory nausea  / vomiting. Start on day 3 after chemotherapy. 30 tablet 1  . prochlorperazine (COMPAZINE) 10 MG tablet Take 1 tablet (10 mg total) by mouth every 6 (six) hours as needed (Nausea or vomiting). 30 tablet 1  . vitamin B-12 (CYANOCOBALAMIN) 1000 MCG tablet Take 1,000 mcg by mouth daily.     No current facility-administered medications for this visit.    VITAL SIGNS: There were no vitals taken for this visit. There were no vitals filed for this visit.  Estimated body mass index is 30.21 kg/m as calculated from the following:   Height as of 09/22/19: 6' 1.62" (1.87 m).   Weight as of 01/12/20: 105.6 kg (232 lb 14.4 oz).  LABS: CBC:    Component Value Date/Time   WBC 8.6 01/10/2020 0825   HGB 11.8 (L) 01/10/2020 0825   HCT 36.6 (L) 01/10/2020 0825   PLT 247 01/10/2020 0825   MCV 89.3 01/10/2020 0825   NEUTROABS 5.7 01/10/2020 0825   LYMPHSABS 1.8 01/10/2020 0825   MONOABS 0.9 01/10/2020 0825   EOSABS 0.1 01/10/2020 0825   BASOSABS 0.1 01/10/2020 0825   Comprehensive Metabolic Panel:    Component Value Date/Time   NA 141 01/10/2020 0825   K 3.9 01/10/2020 0825   CL 105 01/10/2020 0825   CO2 26 01/10/2020 0825   BUN 16 01/10/2020 0825  CREATININE 1.74 (H) 01/10/2020 0825   GLUCOSE 117 (H) 01/10/2020 0825   CALCIUM 8.9 01/10/2020 0825   AST 21 01/10/2020 0825   ALT 13 01/10/2020 0825   ALKPHOS 53 01/10/2020 0825   BILITOT 0.7 01/10/2020 0825   PROT 6.6 01/10/2020 0825   ALBUMIN 3.5 01/10/2020 0825    RADIOGRAPHIC STUDIES: CT CHEST ABDOMEN PELVIS W CONTRAST  Result Date: 01/19/2020 CLINICAL DATA:  Colorectal carcinoma.  Restaging. EXAM: CT CHEST, ABDOMEN, AND PELVIS WITH CONTRAST TECHNIQUE: Multidetector CT imaging of the chest, abdomen and pelvis was performed following the standard protocol during bolus administration of intravenous contrast. CONTRAST:  64mL OMNIPAQUE IOHEXOL 300 MG/ML  SOLN COMPARISON:  09/13/2019 FINDINGS: CT CHEST FINDINGS Cardiovascular: The heart  size appears within normal limits. Aortic atherosclerosis. No pericardial effusion. Coronary artery calcifications. Right chest wall port a catheter is identified. Clot versus fibrin sheath surrounds the intravascular component of the right porta catheter at the level of the right internal jugular vein, image 1/2. Mediastinum/Nodes: Normal appearance of the thyroid gland. The trachea appears patent and is midline. Normal appearance of the esophagus. The no enlarged axillary, supraclavicular, mediastinal, or hilar lymph nodes. Lungs/Pleura: No pleural effusion. No airspace consolidation, atelectasis or pneumothorax. A few scattered punctate lung nodules are again noted. These appear unchanged from previous exam. No new suspicious lung nodules. Musculoskeletal: No acute or suspicious osseous findings. CT ABDOMEN PELVIS FINDINGS Hepatobiliary: No focal liver abnormality is seen. No gallstones, gallbladder wall thickening, or biliary dilatation. Pancreas: Unremarkable. No pancreatic ductal dilatation or surrounding inflammatory changes. Spleen: Normal in size without focal abnormality. Adrenals/Urinary Tract: Adrenal glands are unremarkable. Kidneys are normal, without renal calculi, focal lesion, or hydronephrosis. Bladder is unremarkable. Stomach/Bowel: Stomach appears normal. The appendix is visualized and is normal. No small bowel wall thickening, inflammation or distension. Circumferential mass involving the rectum measures 5.0 x 3.2 cm, image 106/6. This is compared with 5.2 x 4.1 cm previously. No signs of obstruction. Vascular/Lymphatic: Aortic atherosclerosis. No aneurysm. Porta hepatic lymph node has a short axis of 1.3 cm, image 59/2. Unchanged low-density aortocaval lymph node at the level of the left renal vein measures 1 cm, image 69/2. Also unchanged. No pelvic or inguinal adenopathy. Reproductive: Prostate is unremarkable. Other: No free fluid or fluid collections. Musculoskeletal: Degenerative disc  disease identified within the lumbar spine. No acute or suspicious osseous findings. IMPRESSION: 1. Mild interval decrease in size of circumferential mass involving the rectum. 2. Stable appearance of borderline enlarged porta hepatic and aortocaval lymph nodes. 3. No new or progressive disease identified. 4. Unchanged appearance of scattered, punctate lung nodules which are nonspecific and favored to represent a benign process. 5. Clot versus fibrin sheath surrounds the intravascular component of the right porta catheter at the level of the right internal jugular vein. 6. Aortic atherosclerosis and coronary artery calcifications. Aortic Atherosclerosis (ICD10-I70.0). Electronically Signed   By: Kerby Moors M.D.   On: 01/19/2020 10:37    Assessment and Plan-  Started capecitabine on 02/02/20 along with first day of radiation treatment.   Patient Education I spoke with [name] for overview of new oral chemotherapy medication: Xeloda (capecitabine) for the treatment of stage IIIC rectal cancer in conjunction with XRT, planned duration until the end of radiation.   Counseled Mark on administration, dosing, side effects, monitoring, drug-food interactions, safe handling, storage, and disposal. Patient will take 3 tablets (1,500 mg total) by mouth 2 (two) times daily after a meal. Take Monday through Friday during radiation.  Patient's lives  in an assisted living facility and they will be giving the medication to him.   Side effects include but not limited to: diarrhea, hand/foot syndrome, N/V, fatigue, decreased wbc. edema.    Reviewed with [name] importance of keeping a medication schedule and plan for any missed doses.  After discussion with [name], no patient barriers to medication adherence identified. He plan on checking in with the facility while they are administering the capecitabine. He has already been in contact with them about the importance of the medication.  Mark voiced understanding and  appreciation. All questions answered. Medication handout provided.  Provided [facility] with Oral Chemotherapy Navigation Clinic phone number. [name] knows to call the office with questions or concerns. Oral Chemotherapy Navigation Clinic will continue to follow.  Medication Access Issues: [employer] has delivered the Xeloda to [facility] and he has it in hand today. He will bring it to the care facility closer to the start of the capecitabine.   Patient expressed understanding and was in agreement with this plan. He also understands that He can call clinic at any time with any questions, concerns, or complaints.   Thank you for allowing me to participate in the care of this very pleasant patient.   Time Total: 15 mins  Visit consisted of counseling and education on dealing with issues of symptom management in the setting of serious and potentially life-threatening illness.Greater than 50%  of this time was spent counseling and coordinating care related to the above assessment and plan.  Signed by: Darl Pikes, PharmD, BCPS, Salley Slaughter, CPP Hematology/Oncology Clinical Pharmacist Practitioner [facility]/HP/AP [facility] [phone]  01/24/2020 3:18 PM

## 2020-01-26 DIAGNOSIS — C20 Malignant neoplasm of rectum: Secondary | ICD-10-CM | POA: Diagnosis not present

## 2020-01-30 ENCOUNTER — Ambulatory Visit (INDEPENDENT_AMBULATORY_CARE_PROVIDER_SITE_OTHER): Payer: Medicare Other | Admitting: Internal Medicine

## 2020-01-30 ENCOUNTER — Encounter: Payer: Self-pay | Admitting: Internal Medicine

## 2020-01-30 ENCOUNTER — Other Ambulatory Visit: Payer: Self-pay

## 2020-01-30 VITALS — BP 148/82 | HR 66 | Ht 75.0 in | Wt 237.6 lb

## 2020-01-30 DIAGNOSIS — Z01810 Encounter for preprocedural cardiovascular examination: Secondary | ICD-10-CM | POA: Diagnosis not present

## 2020-01-30 DIAGNOSIS — C20 Malignant neoplasm of rectum: Secondary | ICD-10-CM | POA: Diagnosis not present

## 2020-01-30 DIAGNOSIS — Z01818 Encounter for other preprocedural examination: Secondary | ICD-10-CM

## 2020-01-30 DIAGNOSIS — R600 Localized edema: Secondary | ICD-10-CM | POA: Insufficient documentation

## 2020-01-30 DIAGNOSIS — I351 Nonrheumatic aortic (valve) insufficiency: Secondary | ICD-10-CM | POA: Insufficient documentation

## 2020-01-30 DIAGNOSIS — I639 Cerebral infarction, unspecified: Secondary | ICD-10-CM

## 2020-01-30 DIAGNOSIS — I10 Essential (primary) hypertension: Secondary | ICD-10-CM

## 2020-01-30 DIAGNOSIS — E119 Type 2 diabetes mellitus without complications: Secondary | ICD-10-CM

## 2020-01-30 DIAGNOSIS — E1159 Type 2 diabetes mellitus with other circulatory complications: Secondary | ICD-10-CM | POA: Insufficient documentation

## 2020-01-30 NOTE — Progress Notes (Signed)
Cardiology Office Note:    Date:  01/30/2020   ID:  Christopher Delacruz, DOB 1950/09/27, MRN [id number]  PCP:  [name], MD  [facility] Cardiologist:  No primary care provider on file.  [facility] Electrophysiologist:  None   CC: Eval for Colon Surgery Consulted for the evaluation of Pre-surgical Evaluation at the behest of [name], MD  ROBOTIC LOW ANTERIOR RECTOSIGMOID RESECTION- General Anesthesia  History of Present Illness:    Christopher Delacruz is a 70 y.o. male with a hx of DM with HTN, Aortic Atherosclerosis, Coronary Artery Calcification, HLD, Prior CVA with prior hemiparesis, and CKD Stage IIIa, Mild Aortic Insufficiency who presents for evaluation.  Oncological History notable for: Malignancies: Colorectal Adenocarcinoma, S3MH9 Surgery: 09/13/19 Polyp Resection; pending Surgery Chemotherapy: FOLFOX, capecitabine, palonosetron 2021 Cessations for Toxicity: Oxaliplatin reduced for kidney function Radiation: none in the field of view of the heart Oncology care spearheaded by: Dr. Tasia Catchings  Patient notes that he is feeling good.  Has had no chest pain, chest pressure, chest tightness, chest stinging.    Patient exertion notable for physical therapy two months prior and felt no symptoms at that time.  No shortness of breath, DOE.  No PND or orthopnea.  No bendopnea, weight gain, leg swelling , or abdominal swelling.  No syncope or near syncope.  Has had significant improvement in strength in last 2-3 months.  No longer needing a wheelchair or walker.  Able to walk around the mall and by shoes recently (1 week before Christmas).  Notes  no palpitations or funny heart beats.     Patient reports prior cardiac testing including Echo, no stress test, no heart catheterizations,   Ambulatory BP 124/74.   Past Medical History:  Diagnosis Date  . Acute renal failure superimposed on stage 3a chronic kidney disease ([place]) 09/07/2019  . B12 deficiency 09/17/2019  . Cancer ([place])   .  CVA (cerebral vascular accident) ([place])   . Diabetes mellitus ([place])   . Hyperlipidemia   . Hypertension   . Right hemiparesis ([place]) 2017   Associated with stroke    Past Surgical History:  Procedure Laterality Date  . COLONOSCOPY N/A 09/11/2019   Procedure: COLONOSCOPY;  Surgeon: [name], MD;  Location[facility] ENDOSCOPY;  Service: Endoscopy;  Laterality: N/A;  . COLONOSCOPY WITH PROPOFOL N/A 09/12/2019   Procedure: COLONOSCOPY WITH PROPOFOL;  Surgeon: [name], MD;  Location: [facility] ENDOSCOPY;  Service: Gastroenterology;  Laterality: N/A;  . COLONOSCOPY WITH PROPOFOL N/A 09/13/2019   Procedure: COLONOSCOPY WITH PROPOFOL;  Surgeon: [name], MD;  Location: [facility] ENDOSCOPY;  Service: Gastroenterology;  Laterality: N/A;  . ESOPHAGOGASTRODUODENOSCOPY N/A 09/11/2019   Procedure: ESOPHAGOGASTRODUODENOSCOPY (EGD);  Surgeon: [name], MD;  Location: [facility] ENDOSCOPY;  Service: Endoscopy;  Laterality: N/A;  . EXPLORE EYE SOCKET    . PORTACATH PLACEMENT N/A 09/15/2019   Procedure: INSERTION PORT-A-CATH;  Surgeon: Jules Husbands, MD;  Location: [facility] ORS;  Service: General;  Laterality: N/A;    Current Medications: Current Meds  Medication Sig  . amLODipine (NORVASC) 10 MG tablet Take 1 tablet (10 mg total) by mouth daily.  Marland Kitchen aspirin EC 81 MG EC tablet Take 1 tablet (81 mg total) by mouth daily. Swallow whole.  . clopidogrel (PLAVIX) 75 MG tablet Take 1 tablet (75 mg total) by mouth daily.  . ferrous sulfate 325 (65 FE) MG tablet Take 1 tablet (325 mg total) by mouth daily.  . finasteride (PROSCAR) 5 MG tablet Take 5 mg by mouth daily.  Marland Kitchen  multivitamin (ONE-A-DAY MEN'S) TABS tablet Take 1 tablet by mouth daily.  . ondansetron (ZOFRAN) 8 MG tablet Take 1 tablet (8 mg total) by mouth 2 (two) times daily as needed for refractory nausea / vomiting. Start on day 3 after chemotherapy.  . prochlorperazine (COMPAZINE) 10 MG tablet Take 1 tablet (10 mg total) by mouth every 6 (six) hours as needed  (Nausea or vomiting).  . vitamin B-12 (CYANOCOBALAMIN) 1000 MCG tablet Take 1,000 mcg by mouth daily.     Allergies:   Patient has no known allergies.   Social History   Socioeconomic History  . Marital status: Divorced    Spouse name: Not on file  . Number of children: Not on file  . Years of education: Not on file  . Highest education level: Not on file  Occupational History  . 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 Smoking status: Never Smoker  . Smokeless tobacco: Never Used  Vaping Use  . Vaping Use: Never used  Substance and Sexual Activity  . Alcohol use: Never    Alcohol/week: 0.0 standard drinks  . Drug use: Never  . Sexual activity: Not on file  Other Topics Concern  . Not on file  Social History Narrative   Nephew with [facility] due to cognitive impairment of patient after stroke 2017      Lives in [place] in [place], [place]   Social Determinants of Health   Financial Resource Strain: Not on file  Food Insecurity: Not on file  Transportation Needs: Unmet Transportation Needs  . Lack of Transportation (Medical): Yes  . Lack of Transportation (Non-Medical): Yes  Physical Activity: Not on file  Stress: Not on file  Social Connections: Not on file     Family History: The patient's family history includes Heart attack in his sister; Leukemia in his brother; Stroke in his brother. History of coronary artery disease notable for sister. History of heart failure notable for no members. History of arrhythmia notable for ICD in brother.  ROS:   Please see the history of present illness.     All other systems reviewed and are negative.  EKGs/Labs/Other Studies Reviewed:    The following studies were reviewed today:  EKG:   01/30/20: Sinus Rhythm with Frequent PACs; nonspecifci TWI 09/07/19: SR 1st HB rate 61, RBBB (BiFB), occasional  PVCs  Transthoracic Echocardiogram: Date: 03/22/2018 Results: Mild AI 1. The left ventricle has normal systolic function, with an ejection  fraction of 55-60%. The cavity size was normal. There is mildly increased  left ventricular wall thickness. Left ventricular diastolic Doppler  parameters are consistent with impaired  relaxation.  2. The right ventricle has normal systolic function. The cavity was  normal. There is no increase in right ventricular wall thickness.  3. The mitral valve is normal in structure. Mild thickening of the mitral  valve leaflet.  4. The tricuspid valve is normal in structure.  5. The aortic valve is normal in structure. Moderate thickening of the  aortic valve Aortic valve regurgitation is mild by color flow Doppler.   NonCardiac CT: Date: 09/13/2019 Results:Aortic Arch Atherosclerosis with RI Coronary Artery Calcium  Recent Labs: 09/13/2019: Magnesium 2.1 01/10/2020: ALT 13; BUN 16; Creatinine, Ser 1.74; Hemoglobin 11.8; Platelets 247; Potassium 3.9; Sodium 141  Recent Lipid Panel    Component Value Date/Time   CHOL 110 09/16/2019 0905  TRIG 73 09/16/2019 0905   HDL 48 09/16/2019 0905   CHOLHDL 2.3 09/16/2019 0905   VLDL 15 09/16/2019 0905   LDLCALC 47 09/16/2019 0905    Risk Assessment/Calculations:     N/A  Physical Exam:    VS:  BP (!) 148/82   Pulse 66   Ht 6\' 3"  (1.905 m)   Wt 237 lb 9.6 oz (107.8 kg)   SpO2 93%   BMI 29.70 kg/m     Wt Readings from Last 3 Encounters:  01/30/20 237 lb 9.6 oz (107.8 kg)  01/12/20 232 lb 14.4 oz (105.6 kg)  01/10/20 232 lb 3.2 oz (105.3 kg)     GEN:  Well nourished, well developed in no acute distress HEENT: Normal NECK: No JVD; No carotid bruits LYMPHATICS: No lymphadenopathy CARDIAC: RRR, II/VI diastolic murmur, no rubs, gallops;  RESPIRATORY:  Clear to auscultation without rales, wheezing or rhonchi  ABDOMEN: Soft, non-tender, non-distended MUSCULOSKELETAL:  +2 R leg edema, +1 leg  leg edema; No deformity  SKIN: Warm and dry NEUROLOGIC:  Alert and oriented x 3 PSYCHIATRIC:  Normal affect   ASSESSMENT:    1. Pre-op evaluation   2. Aortic valve insufficiency, etiology of cardiac valve disease unspecified   3. Cerebrovascular accident (CVA), unspecified mechanism ([place])   4. Rectal cancer ([place])   5. Diabetes mellitus with coincident hypertension ([place])   6. Lower extremity edema    PLAN:    In order of problems listed above:  Preoperative Risk Assessment Mild Aortic Regurgitation Hx of CVA - The Revised Cardiac Risk Index = 1  For CVA 1=0.9% estimated low risk of perioperative myocardial infarction, pulmonary edema, ventricular fibrillation, cardiac arrest, or complete heart block.  - DASI score of 12.75 associated with 4.31 functional mets - No further cardiac testing is recommended prior to surgery.  - The patient may proceed to surgery at acceptable risk- discussed risks with patient and nephew - will get echocardiogram because of prominence of the heart murmur; this need not necessarily keep him from surgery - can temporarily hold DAPT for surgery  Prior Malignancy with Cardiotoxic Chemotherapy - with Age ?61 y - with other risk factors, including smoking, HTN, DM, HLD, CKD, Obesity, previous prior stroke - LV Function including GLS or other Strain, 3D Eval, MAPSE, or CMR: 55-60% -  (5-FU) and capecitabine without evidence of angina sx or evidence of coronary vasospasm - Encouraged exercise on a regular basis and healthy dietary habits  Diabetes with hypertension LE Edema - ambulatory blood pressure controlled, will continue ambulatory BP monitoring; gave education on how to perform ambulatory blood pressure monitoring including the frequency and technique; goal ambulatory blood pressure < 135/85 on average - continue home medications - discussed diet (DASH/low sodium), and exercise/weight loss interventions - at out next visit, will discuss LE edema in  the setting of norvasc   3 months follow up unless new symptoms or abnormal test results warranting change in plan  Would be reasonable for  APP Follow up     Medication Adjustments/Labs and Tests Ordered: Current medicines are reviewed at length with the patient today.  Concerns regarding medicines are outlined above.  Orders Placed This Encounter  Procedures  . EKG 12-Lead  . ECHOCARDIOGRAM COMPLETE   No orders of the defined types were placed in this encounter.   Patient Instructions  Medication Instructions:  Your physician recommends that you continue on your current medications as directed. Please refer to the Current Medication list given to you  today. *If you need a refill on your cardiac medications before your next appointment, please call your pharmacy*  Lab Work: None ordered. If you have labs (blood work) drawn today and your tests are completely normal, you will receive your results only by: Marland Kitchen MyChart Message (if you have MyChart) OR . A paper copy in the mail If you have any lab test that is abnormal or we need to change your treatment, we will call you to review the results.  Testing/Procedures: Your physician has requested that you have an echocardiogram. Echocardiography is a painless test that uses sound waves to create images of your heart. It provides your doctor with information about the size and shape of your heart and how well your heart's chambers and valves are working. This procedure takes approximately one hour. There are no restrictions for this procedure.  Please schedule for echo  Follow-Up: At [facility], you and your health needs are our priority.  As part of our continuing mission to provide you with exceptional heart care, we have created designated Provider Care Teams.  These Care Teams include your primary Cardiologist (physician) and Advanced Practice Providers (APPs -  Physician Assistants and Nurse Practitioners) who all work together  to provide you with the care you need, when you need it.  Your next appointment:   3 month(s)  The format for your next appointment:   In Person  Provider:   Rudean Haskell, MD       Signed, [name], MD  01/30/2020 10:19 AM    [place]

## 2020-01-30 NOTE — Patient Instructions (Signed)
Medication Instructions:  Your physician recommends that you continue on your current medications as directed. Please refer to the Current Medication list given to you today. *If you need a refill on your cardiac medications before your next appointment, please call your pharmacy*  Lab Work: None ordered. If you have labs (blood work) drawn today and your tests are completely normal, you will receive your results only by: Marland Kitchen MyChart Message (if you have MyChart) OR . A paper copy in the mail If you have any lab test that is abnormal or we need to change your treatment, we will call you to review the results.  Testing/Procedures: Your physician has requested that you have an echocardiogram. Echocardiography is a painless test that uses sound waves to create images of your heart. It provides your doctor with information about the size and shape of your heart and how well your heart's chambers and valves are working. This procedure takes approximately one hour. There are no restrictions for this procedure.  Please schedule for echo  Follow-Up: At [facility], you and your health needs are our priority.  As part of our continuing mission to provide you with exceptional heart care, we have created designated Provider Care Teams.  These Care Teams include your primary Cardiologist (physician) and Advanced Practice Providers (APPs -  Physician Assistants and Nurse Practitioners) who all work together to provide you with the care you need, when you need it.  Your next appointment:   3 month(s)  The format for your next appointment:   In Person  Provider:   Rudean Haskell, MD

## 2020-02-01 ENCOUNTER — Ambulatory Visit: Admission: RE | Admit: 2020-02-01 | Payer: Medicare Other | Source: Ambulatory Visit

## 2020-02-01 ENCOUNTER — Other Ambulatory Visit: Payer: Self-pay

## 2020-02-01 ENCOUNTER — Inpatient Hospital Stay: Payer: Medicare Other | Attending: Oncology

## 2020-02-01 DIAGNOSIS — T451X5A Adverse effect of antineoplastic and immunosuppressive drugs, initial encounter: Secondary | ICD-10-CM | POA: Insufficient documentation

## 2020-02-01 DIAGNOSIS — R112 Nausea with vomiting, unspecified: Secondary | ICD-10-CM | POA: Insufficient documentation

## 2020-02-01 DIAGNOSIS — R599 Enlarged lymph nodes, unspecified: Secondary | ICD-10-CM | POA: Insufficient documentation

## 2020-02-01 DIAGNOSIS — Z823 Family history of stroke: Secondary | ICD-10-CM | POA: Insufficient documentation

## 2020-02-01 DIAGNOSIS — F0150 Vascular dementia without behavioral disturbance: Secondary | ICD-10-CM | POA: Insufficient documentation

## 2020-02-01 DIAGNOSIS — C20 Malignant neoplasm of rectum: Secondary | ICD-10-CM | POA: Insufficient documentation

## 2020-02-01 DIAGNOSIS — N1831 Chronic kidney disease, stage 3a: Secondary | ICD-10-CM | POA: Insufficient documentation

## 2020-02-01 DIAGNOSIS — E538 Deficiency of other specified B group vitamins: Secondary | ICD-10-CM | POA: Insufficient documentation

## 2020-02-01 DIAGNOSIS — Z8249 Family history of ischemic heart disease and other diseases of the circulatory system: Secondary | ICD-10-CM | POA: Insufficient documentation

## 2020-02-01 DIAGNOSIS — R160 Hepatomegaly, not elsewhere classified: Secondary | ICD-10-CM | POA: Insufficient documentation

## 2020-02-01 DIAGNOSIS — Z806 Family history of leukemia: Secondary | ICD-10-CM | POA: Insufficient documentation

## 2020-02-01 DIAGNOSIS — R918 Other nonspecific abnormal finding of lung field: Secondary | ICD-10-CM | POA: Insufficient documentation

## 2020-02-01 DIAGNOSIS — I2510 Atherosclerotic heart disease of native coronary artery without angina pectoris: Secondary | ICD-10-CM | POA: Insufficient documentation

## 2020-02-01 DIAGNOSIS — M5136 Other intervertebral disc degeneration, lumbar region: Secondary | ICD-10-CM | POA: Insufficient documentation

## 2020-02-01 DIAGNOSIS — E860 Dehydration: Secondary | ICD-10-CM | POA: Insufficient documentation

## 2020-02-01 DIAGNOSIS — D509 Iron deficiency anemia, unspecified: Secondary | ICD-10-CM | POA: Insufficient documentation

## 2020-02-01 DIAGNOSIS — I700 Atherosclerosis of aorta: Secondary | ICD-10-CM | POA: Insufficient documentation

## 2020-02-01 DIAGNOSIS — R197 Diarrhea, unspecified: Secondary | ICD-10-CM | POA: Insufficient documentation

## 2020-02-01 DIAGNOSIS — Z79899 Other long term (current) drug therapy: Secondary | ICD-10-CM | POA: Insufficient documentation

## 2020-02-01 DIAGNOSIS — R4189 Other symptoms and signs involving cognitive functions and awareness: Secondary | ICD-10-CM | POA: Insufficient documentation

## 2020-02-02 ENCOUNTER — Other Ambulatory Visit: Payer: Self-pay

## 2020-02-02 ENCOUNTER — Inpatient Hospital Stay: Payer: Medicare Other

## 2020-02-02 ENCOUNTER — Encounter: Payer: Self-pay | Admitting: Oncology

## 2020-02-02 ENCOUNTER — Ambulatory Visit
Admission: RE | Admit: 2020-02-02 | Discharge: 2020-02-02 | Disposition: A | Discharge status: Home / Self Care | Payer: Medicare Other | Source: Ambulatory Visit | Attending: Radiation Oncology | Admitting: Radiation Oncology

## 2020-02-02 ENCOUNTER — Inpatient Hospital Stay (HOSPITAL_BASED_OUTPATIENT_CLINIC_OR_DEPARTMENT_OTHER): Payer: Medicare Other | Admitting: Oncology

## 2020-02-02 VITALS — BP 178/84 | HR 66 | Temp 97.6°F | Resp 18 | Wt 233.9 lb

## 2020-02-02 DIAGNOSIS — N1831 Chronic kidney disease, stage 3a: Secondary | ICD-10-CM | POA: Diagnosis not present

## 2020-02-02 DIAGNOSIS — Z5111 Encounter for antineoplastic chemotherapy: Secondary | ICD-10-CM | POA: Diagnosis not present

## 2020-02-02 DIAGNOSIS — Z806 Family history of leukemia: Secondary | ICD-10-CM | POA: Diagnosis not present

## 2020-02-02 DIAGNOSIS — Z79899 Other long term (current) drug therapy: Secondary | ICD-10-CM | POA: Diagnosis not present

## 2020-02-02 DIAGNOSIS — C20 Malignant neoplasm of rectum: Secondary | ICD-10-CM

## 2020-02-02 DIAGNOSIS — D631 Anemia in chronic kidney disease: Secondary | ICD-10-CM

## 2020-02-02 DIAGNOSIS — F0150 Vascular dementia without behavioral disturbance: Secondary | ICD-10-CM | POA: Diagnosis not present

## 2020-02-02 DIAGNOSIS — T451X5A Adverse effect of antineoplastic and immunosuppressive drugs, initial encounter: Secondary | ICD-10-CM | POA: Diagnosis not present

## 2020-02-02 DIAGNOSIS — D509 Iron deficiency anemia, unspecified: Secondary | ICD-10-CM | POA: Diagnosis not present

## 2020-02-02 DIAGNOSIS — R160 Hepatomegaly, not elsewhere classified: Secondary | ICD-10-CM | POA: Diagnosis not present

## 2020-02-02 DIAGNOSIS — M5136 Other intervertebral disc degeneration, lumbar region: Secondary | ICD-10-CM | POA: Diagnosis not present

## 2020-02-02 DIAGNOSIS — R4189 Other symptoms and signs involving cognitive functions and awareness: Secondary | ICD-10-CM

## 2020-02-02 DIAGNOSIS — I2510 Atherosclerotic heart disease of native coronary artery without angina pectoris: Secondary | ICD-10-CM | POA: Diagnosis not present

## 2020-02-02 DIAGNOSIS — R918 Other nonspecific abnormal finding of lung field: Secondary | ICD-10-CM | POA: Diagnosis not present

## 2020-02-02 DIAGNOSIS — E538 Deficiency of other specified B group vitamins: Secondary | ICD-10-CM

## 2020-02-02 DIAGNOSIS — R112 Nausea with vomiting, unspecified: Secondary | ICD-10-CM | POA: Diagnosis not present

## 2020-02-02 DIAGNOSIS — Z8249 Family history of ischemic heart disease and other diseases of the circulatory system: Secondary | ICD-10-CM | POA: Diagnosis not present

## 2020-02-02 DIAGNOSIS — N1832 Chronic kidney disease, stage 3b: Secondary | ICD-10-CM

## 2020-02-02 DIAGNOSIS — Z823 Family history of stroke: Secondary | ICD-10-CM | POA: Diagnosis not present

## 2020-02-02 DIAGNOSIS — E860 Dehydration: Secondary | ICD-10-CM | POA: Diagnosis not present

## 2020-02-02 DIAGNOSIS — R197 Diarrhea, unspecified: Secondary | ICD-10-CM | POA: Diagnosis not present

## 2020-02-02 DIAGNOSIS — I700 Atherosclerosis of aorta: Secondary | ICD-10-CM | POA: Diagnosis not present

## 2020-02-02 DIAGNOSIS — R599 Enlarged lymph nodes, unspecified: Secondary | ICD-10-CM | POA: Diagnosis not present

## 2020-02-02 LAB — CBC WITH DIFFERENTIAL/PLATELET
Abs Immature Granulocytes: 0.01 10*3/uL (ref 0.00–0.07)
Basophils Absolute: 0.1 10*3/uL (ref 0.0–0.1)
Basophils Relative: 1 %
Eosinophils Absolute: 0.1 10*3/uL (ref 0.0–0.5)
Eosinophils Relative: 2 %
HCT: 36.6 % — ABNORMAL LOW (ref 39.0–52.0)
Hemoglobin: 12.2 g/dL — ABNORMAL LOW (ref 13.0–17.0)
Immature Granulocytes: 0 %
Lymphocytes Relative: 28 %
Lymphs Abs: 1.9 10*3/uL (ref 0.7–4.0)
MCH: 29.8 pg (ref 26.0–34.0)
MCHC: 33.3 g/dL (ref 30.0–36.0)
MCV: 89.5 fL (ref 80.0–100.0)
Monocytes Absolute: 0.9 10*3/uL (ref 0.1–1.0)
Monocytes Relative: 13 %
Neutro Abs: 3.7 10*3/uL (ref 1.7–7.7)
Neutrophils Relative %: 56 %
Platelets: 226 10*3/uL (ref 150–400)
RBC: 4.09 MIL/uL — ABNORMAL LOW (ref 4.22–5.81)
RDW: 14.1 % (ref 11.5–15.5)
WBC: 6.7 10*3/uL (ref 4.0–10.5)
nRBC: 0 % (ref 0.0–0.2)

## 2020-02-02 LAB — COMPREHENSIVE METABOLIC PANEL
ALT: 14 U/L (ref 0–44)
AST: 18 U/L (ref 15–41)
Albumin: 3.6 g/dL (ref 3.5–5.0)
Alkaline Phosphatase: 63 U/L (ref 38–126)
Anion gap: 9 (ref 5–15)
BUN: 17 mg/dL (ref 8–23)
CO2: 28 mmol/L (ref 22–32)
Calcium: 9.2 mg/dL (ref 8.9–10.3)
Chloride: 106 mmol/L (ref 98–111)
Creatinine, Ser: 1.76 mg/dL — ABNORMAL HIGH (ref 0.61–1.24)
GFR, Estimated: 41 mL/min — ABNORMAL LOW (ref >60)
Glucose, Bld: 97 mg/dL (ref 70–99)
Potassium: 4.1 mmol/L (ref 3.5–5.1)
Sodium: 143 mmol/L (ref 135–145)
Total Bilirubin: 0.8 mg/dL (ref 0.3–1.2)
Total Protein: 6.7 g/dL (ref 6.5–8.1)

## 2020-02-02 MED ORDER — SODIUM CHLORIDE 0.9 % IV SOLN
INTRAVENOUS | Status: DC
  Filled 2020-02-02 (×2): qty 250

## 2020-02-02 MED ORDER — HEPARIN SOD (PORK) LOCK FLUSH 100 UNIT/ML IV SOLN
500.0000 [IU] | Freq: Once | INTRAVENOUS | Status: AC
  Administered 2020-02-02: 500 [IU] via INTRAVENOUS
  Filled 2020-02-02: qty 5

## 2020-02-02 MED ORDER — SODIUM CHLORIDE 0.9% FLUSH
10.0000 mL | INTRAVENOUS | Status: DC | PRN
  Administered 2020-02-02: 10 mL via INTRAVENOUS
  Filled 2020-02-02: qty 10

## 2020-02-02 NOTE — Progress Notes (Signed)
Hematology/Oncology Follow Up Note [facility]  [phone] [phone]  Patient Care Team: [name], MD as PCP - General (Family Medicine) Borders, Kirt Boys, NP as Nurse Practitioner (Hospice and Palliative Medicine) [name], MD as Consulting Physician (Oncology) [name], MD as Referring Physician (Radiation Oncology) [name], NP as Nurse Practitioner [name], RN as Oncology Nurse Navigator [name], MD as Consulting Physician (Neurology) [name], MD as Consulting Physician (Gastroenterology) [name], MD as Consulting Physician (Psychiatry) [name], MD as Consulting Physician (Colon and Rectal Surgery)   Name of the patient: Christopher Delacruz  [id number]  04-Mar-1950   REASON FOR VISIT  follow-up for rectal cancer  PERTINENT ONCOLOGY HISTORY Christopher Delacruz is a 70 y.o.[place] who has above oncology history reviewed by me today presented for follow up visit for management of locally advanced rectal cancer. -Initially presented to emergency room due to profound anemia. 09/11/2019 EGD showed duodenal erosions without bleeding 09/11/2019 colonoscopy preparation was poor.  Likely malignant partially obstructing tumor in the rectosigmoid colon area which was biopsied. 09/13/2019, repeat colonoscopy showed 12 mm polyps were found in the descending colon.  Polyp was sessile.  Polyp was resected and retrieved.  A polypoid ulcerated partially obstructing large mass was found from 10 to 20 cm proximal to the anus.  The mass was circumferential, measured 10 cm in length.  In addition the diameter measures 12 mm.  Oozing was present 09/11/2019, biopsy showed invasive colorectal adenocarcinoma.  Moderately differentiated. 09/13/2019, CT chest abdomen pelvis showed rectal wall mass with evidence of local invasion to the mesorectal fat and potential involvement of the right seminal vesicle.  Multiple  enlarged lymph nodes.  Small lung nodules 2 to 3 mm in the lung bilaterally.  Highly nonspecific.  Cannot rule out metastasis.  09/14/2019 MRI pelvis showed T4b N2 rectal cancer. 09/15/2019 MRI brain with and without contrast showed punctate focus of restricted diffusion most likely reflects acute or early subacute small vessel infarct.  No evidence of intracranial metastasis.  Multiple chronic infarct.  #Iron deficiency anemia, patient received PRBC transfusion and IV Venofer treatments. #Vitamin B12 deficiency, status post parenteral vitamin B12 injections during hospitalization.  Currently on vitamin B12 supplementation. #Patient was found to have very poor insight to his condition.  Was seen by psychiatrist and was deemed incompetent.  Patient's medical power of attorney is his nephew [name].  #Mediport was placed by Dr. Dahlia Byes  # #H. pylori gastritis, patient finishes course of antibiotics and PPI # 09/26/2019 started on chemotherapy with FOLFOX.  INTERVA L HISTORY 70 y.o. patient presents for follow-up of rectal cancer..  Patient is here by himself patient has no new complaints.  He is a poor historian. Patient reports intermittent loose bowel movements, details unknown.   Review of Systems  Unable to perform ROS: Other (Cognitive impairment)  Constitutional: Negative for fatigue.  Respiratory: Negative for shortness of breath.   Gastrointestinal: Negative for abdominal pain, blood in stool and constipation.      No Known Allergies   Past Medical History:  Diagnosis Date  . Acute renal failure superimposed on stage 3a chronic kidney disease ([place]) 09/07/2019  . B12 deficiency 09/17/2019  . Cancer ([place])   . CVA (cerebral vascular accident) ([place])   . Diabetes mellitus ([place])   . Hyperlipidemia   . Hypertension   . Right hemiparesis ([place]) 2017   Associated with stroke     Past Surgical History:  Procedure Laterality Date  .  COLONOSCOPY N/A 09/11/2019   Procedure: COLONOSCOPY;   Surgeon: [name], MD;  Location: [facility] ENDOSCOPY;  Service: Endoscopy;  Laterality: N/A;  . COLONOSCOPY WITH PROPOFOL N/A 09/12/2019   Procedure: COLONOSCOPY WITH PROPOFOL;  Surgeon: [name], MD;  Location[facility] ENDOSCOPY;  Service: Gastroenterology;  Laterality: N/A;  . COLONOSCOPY WITH PROPOFOL N/A 09/13/2019   Procedure: COLONOSCOPY WITH PROPOFOL;  Surgeon: [name], MD;  Location: [facility] ENDOSCOPY;  Service: Gastroenterology;  Laterality: N/A;  . ESOPHAGOGASTRODUODENOSCOPY N/A 09/11/2019   Procedure: ESOPHAGOGASTRODUODENOSCOPY (EGD);  Surgeon: [name], MD;  Location: [facility] ENDOSCOPY;  Service: Endoscopy;  Laterality: N/A;  . EXPLORE EYE SOCKET    . PORTACATH PLACEMENT N/A 09/15/2019   Procedure: INSERTION PORT-A-CATH;  Surgeon: Jules Husbands, MD;  Location: [facility] ORS;  Service: General;  Laterality: N/A;    Social History   Socioeconomic History  . Marital status: Divorced    Spouse name: Not on file  . Number of children: Not on file  . Years of education: Not on file  . Highest education level: Not on file  Occupational History  . 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 Smoking status: Never Smoker  . Smokeless tobacco: Never Used  Vaping Use  . Vaping Use: Never used  Substance and Sexual Activity  . Alcohol use: Never    Alcohol/week: 0.0 standard drinks  . Drug use: Never  . Sexual activity: Not on file  Other Topics Concern  . Not on file  Social History Narrative   Nephew with [place] due to cognitive impairment of patient after stroke 2017      Lives in [place] in [place], [place]   Social Determinants of Health   Financial Resource Strain: Not on file  Food Insecurity: Not on file  Transportation Needs: Unmet Transportation Needs  . Lack of Transportation (Medical): Yes  . Lack of Transportation (Non-Medical): Yes  Physical Activity: Not  on file  Stress: Not on file  Social Connections: Not on file  Intimate Partner Violence: Not on file    Family History  Problem Relation Age of Onset  . Heart attack Sister   . Leukemia Brother   . Stroke Brother      Current Outpatient Medications:  .  amLODipine (NORVASC) 10 MG tablet, Take 1 tablet (10 mg total) by mouth daily., Disp: 30 tablet, Rfl: 1 .  aspirin EC 81 MG EC tablet, Take 1 tablet (81 mg total) by mouth daily. Swallow whole., Disp: 30 tablet, Rfl: 11 .  capecitabine (XELODA) 500 MG tablet, Take 3 tablets (1,500 mg total) by mouth 2 (two) times daily after a meal. Take Monday through Friday during radiation., Disp: 150 tablet, Rfl: 0 .  clopidogrel (PLAVIX) 75 MG tablet, Take 1 tablet (75 mg total) by mouth daily., Disp: 30 tablet, Rfl: 1 .  ferrous sulfate 325 (65 FE) MG tablet, Take 1 tablet (325 mg total) by mouth daily., Disp: 30 tablet, Rfl: 3 .  finasteride (PROSCAR) 5 MG tablet, Take 5 mg by mouth daily., Disp: , Rfl:  .  multivitamin (ONE-A-DAY MEN'S) TABS tablet, Take 1 tablet by mouth daily., Disp: , Rfl:  .  nystatin (MYCOSTATIN) 100000 UNIT/ML suspension, Take 5 mLs by mouth 4 (four) times daily. Swish and spit  Reported by [facility] [place], Disp: , Rfl:  .  ondansetron (ZOFRAN) 8 MG tablet, Take 1  tablet (8 mg total) by mouth 2 (two) times daily as needed for refractory nausea / vomiting. Start on day 3 after chemotherapy., Disp: 30 tablet, Rfl: 1 .  vitamin B-12 (CYANOCOBALAMIN) 1000 MCG tablet, Take 1,000 mcg by mouth daily., Disp: , Rfl:  .  prochlorperazine (COMPAZINE) 10 MG tablet, Take 1 tablet (10 mg total) by mouth every 6 (six) hours as needed (Nausea or vomiting). (Patient not taking: Reported on 02/02/2020), Disp: 30 tablet, Rfl: 1 No current facility-administered medications for this visit.  Facility-Administered Medications Ordered in Other Visits:  .  0.9 %  sodium chloride infusion, , Intravenous, Continuous, [name], MD, Stopped at 02/02/20  1300 .  sodium chloride flush (NS) 0.9 % injection 10 mL, 10 mL, Intravenous, PRN, [name], MD, 10 mL at 02/02/20 1153  Physical exam:  Vitals:   02/02/20 1106  BP: (!) 178/84  Pulse: 66  Resp: 18  Temp: 97.6 F (36.4 C)  Weight: 233 lb 14.4 oz (106.1 kg)   Physical Exam Constitutional:      General: He is not in acute distress.    Comments: Patient walks independently  HENT:     Head: Normocephalic and atraumatic.  Eyes:     General: No scleral icterus. Cardiovascular:     Rate and Rhythm: Normal rate and regular rhythm.     Heart sounds: Normal heart sounds.  Pulmonary:     Effort: Pulmonary effort is normal. No respiratory distress.     Breath sounds: No wheezing.  Abdominal:     General: Bowel sounds are normal. There is no distension.     Palpations: Abdomen is soft.  Musculoskeletal:        General: No deformity. Normal range of motion.     Cervical back: Normal range of motion and neck supple.  Skin:    General: Skin is warm and dry.     Findings: No erythema or rash.  Neurological:     Mental Status: He is alert. Mental status is at baseline.     Cranial Nerves: No cranial nerve deficit.     Coordination: Coordination normal.     Comments: Orientated x2  Psychiatric:        Mood and Affect: Mood normal.     CMP Latest Ref Rng & Units 02/02/2020  Glucose 70 - 99 mg/dL 97  BUN 8 - 23 mg/dL 17  Creatinine 0.61 - 1.24 mg/dL 1.76(H)  Sodium 135 - 145 mmol/L 143  Potassium 3.5 - 5.1 mmol/L 4.1  Chloride 98 - 111 mmol/L 106  CO2 22 - 32 mmol/L 28  Calcium 8.9 - 10.3 mg/dL 9.2  Total Protein 6.5 - 8.1 g/dL 6.7  Total Bilirubin 0.3 - 1.2 mg/dL 0.8  Alkaline Phos 38 - 126 U/L 63  AST 15 - 41 U/L 18  ALT 0 - 44 U/L 14   CBC Latest Ref Rng & Units 02/02/2020  WBC 4.0 - 10.5 K/uL 6.7  Hemoglobin 13.0 - 17.0 g/dL 12.2(L)  Hematocrit 39.0 - 52.0 % 36.6(L)  Platelets 150 - 400 K/uL 226    RADIOGRAPHIC STUDIES: I have personally reviewed the radiological  images as listed and agreed with the findings in the report. CT CHEST ABDOMEN PELVIS W CONTRAST  Result Date: 01/19/2020 CLINICAL DATA:  Colorectal carcinoma.  Restaging. EXAM: CT CHEST, ABDOMEN, AND PELVIS WITH CONTRAST TECHNIQUE: Multidetector CT imaging of the chest, abdomen and pelvis was performed following the standard protocol during bolus administration of intravenous contrast. CONTRAST:  2mL  OMNIPAQUE IOHEXOL 300 MG/ML  SOLN COMPARISON:  09/13/2019 FINDINGS: CT CHEST FINDINGS Cardiovascular: The heart size appears within normal limits. Aortic atherosclerosis. No pericardial effusion. Coronary artery calcifications. Right chest wall port a catheter is identified. Clot versus fibrin sheath surrounds the intravascular component of the right porta catheter at the level of the right internal jugular vein, image 1/2. Mediastinum/Nodes: Normal appearance of the thyroid gland. The trachea appears patent and is midline. Normal appearance of the esophagus. The no enlarged axillary, supraclavicular, mediastinal, or hilar lymph nodes. Lungs/Pleura: No pleural effusion. No airspace consolidation, atelectasis or pneumothorax. A few scattered punctate lung nodules are again noted. These appear unchanged from previous exam. No new suspicious lung nodules. Musculoskeletal: No acute or suspicious osseous findings. CT ABDOMEN PELVIS FINDINGS Hepatobiliary: No focal liver abnormality is seen. No gallstones, gallbladder wall thickening, or biliary dilatation. Pancreas: Unremarkable. No pancreatic ductal dilatation or surrounding inflammatory changes. Spleen: Normal in size without focal abnormality. Adrenals/Urinary Tract: Adrenal glands are unremarkable. Kidneys are normal, without renal calculi, focal lesion, or hydronephrosis. Bladder is unremarkable. Stomach/Bowel: Stomach appears normal. The appendix is visualized and is normal. No small bowel wall thickening, inflammation or distension. Circumferential mass involving  the rectum measures 5.0 x 3.2 cm, image 106/6. This is compared with 5.2 x 4.1 cm previously. No signs of obstruction. Vascular/Lymphatic: Aortic atherosclerosis. No aneurysm. Porta hepatic lymph node has a short axis of 1.3 cm, image 59/2. Unchanged low-density aortocaval lymph node at the level of the left renal vein measures 1 cm, image 69/2. Also unchanged. No pelvic or inguinal adenopathy. Reproductive: Prostate is unremarkable. Other: No free fluid or fluid collections. Musculoskeletal: Degenerative disc disease identified within the lumbar spine. No acute or suspicious osseous findings. IMPRESSION: 1. Mild interval decrease in size of circumferential mass involving the rectum. 2. Stable appearance of borderline enlarged porta hepatic and aortocaval lymph nodes. 3. No new or progressive disease identified. 4. Unchanged appearance of scattered, punctate lung nodules which are nonspecific and favored to represent a benign process. 5. Clot versus fibrin sheath surrounds the intravascular component of the right porta catheter at the level of the right internal jugular vein. 6. Aortic atherosclerosis and coronary artery calcifications. Aortic Atherosclerosis (ICD10-I70.0). Electronically Signed   By: Kerby Moors M.D.   On: 01/19/2020 10:37     Assessment and plan 1. Rectal cancer ([place])   2. Encounter for antineoplastic chemotherapy   3. Cognitive impairment   4. B12 deficiency   5. Anemia in stage 3b chronic kidney disease ([place])   Cancer Staging Rectal cancer [facility]) Staging form: Colon and Rectum, AJCC 8th Edition - Clinical stage from 09/22/2019: Stage IIIC (cT4b, cN2, cM0) - Signed by [name], MD on 09/22/2019  Stage IIIC rectal cancer cT4 N2 M0 rectal cancer Labs reviewed and discussed with patient.  Patient starts radiation today. Recommend patient to start Xeloda 1500 mg twice daily on radiation days. 01/19/2020, CT chest abdomen pelvis with contrast showed mild interval decrease in size of  the circumferential mass involving the rectum.  Stable appearance borderline enlarged porta hepatic and aorto caval lymph nodes.  No new or progressive disease. Scattered punctated lung nodules, unchanged.  Clot versus fibrin sheath surrounding the intravascular component of the right portacatheter.  No report of Mediport malfunctioning and he is asymptomatic.  Monitor.  #Chronic kidney disease, creatinine is stable at 1.76.  Encourage oral hydration. Patient get 1 L of IV fluid today.  #Intermittent loose stool bowel movements.  I called patient's power of  attorney [name] who informs me that nursing home staff told [name] that patient has had " accidents daily" ever since patient was sent there.-Before chemotherapy treatments. [name] will contact nursing home and obtain additional information.  Anemia, hemoglobin is 12.2.  Stable. Vitamin B12 deficiency, continue B12 supplementation. 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  Continue monitor.  We spent sufficient time to discuss many aspect of care, questions were answered to patient's satisfaction. Patient has appointment with Dr. Baruch Gouty this week.  I will see patient on his first day of radiation.   [name], MD, PhD Hematology Oncology [facility] at [facility] Pager- [phone] 02/02/2020

## 2020-02-02 NOTE — Progress Notes (Signed)
Patient reports occasional diarrhea with last episode last night.    [place] for medication reconciliation.

## 2020-02-03 ENCOUNTER — Ambulatory Visit
Admission: RE | Admit: 2020-02-03 | Discharge: 2020-02-03 | Disposition: A | Discharge status: Home / Self Care | Payer: Medicare Other | Source: Ambulatory Visit | Attending: Radiation Oncology | Admitting: Radiation Oncology

## 2020-02-03 ENCOUNTER — Inpatient Hospital Stay: Payer: Medicare Other

## 2020-02-03 DIAGNOSIS — C20 Malignant neoplasm of rectum: Secondary | ICD-10-CM | POA: Diagnosis not present

## 2020-02-06 ENCOUNTER — Inpatient Hospital Stay: Payer: Medicare Other

## 2020-02-06 ENCOUNTER — Ambulatory Visit
Admission: RE | Admit: 2020-02-06 | Discharge: 2020-02-06 | Disposition: A | Discharge status: Home / Self Care | Payer: Medicare Other | Source: Ambulatory Visit | Attending: Radiation Oncology | Admitting: Radiation Oncology

## 2020-02-06 DIAGNOSIS — C20 Malignant neoplasm of rectum: Secondary | ICD-10-CM | POA: Diagnosis not present

## 2020-02-07 ENCOUNTER — Ambulatory Visit
Admission: RE | Admit: 2020-02-07 | Discharge: 2020-02-07 | Disposition: A | Discharge status: Home / Self Care | Payer: Medicare Other | Source: Ambulatory Visit | Attending: Radiation Oncology | Admitting: Radiation Oncology

## 2020-02-07 ENCOUNTER — Inpatient Hospital Stay: Payer: Medicare Other

## 2020-02-07 DIAGNOSIS — C20 Malignant neoplasm of rectum: Secondary | ICD-10-CM | POA: Diagnosis not present

## 2020-02-08 ENCOUNTER — Ambulatory Visit
Admission: RE | Admit: 2020-02-08 | Discharge: 2020-02-08 | Disposition: A | Discharge status: Home / Self Care | Payer: Medicare Other | Source: Ambulatory Visit | Attending: Radiation Oncology | Admitting: Radiation Oncology

## 2020-02-08 ENCOUNTER — Other Ambulatory Visit: Payer: Self-pay

## 2020-02-08 ENCOUNTER — Inpatient Hospital Stay: Payer: Medicare Other

## 2020-02-08 DIAGNOSIS — C20 Malignant neoplasm of rectum: Secondary | ICD-10-CM | POA: Diagnosis not present

## 2020-02-09 ENCOUNTER — Telehealth: Payer: Self-pay | Admitting: Oncology

## 2020-02-09 ENCOUNTER — Encounter: Payer: Self-pay | Admitting: Oncology

## 2020-02-09 ENCOUNTER — Inpatient Hospital Stay: Payer: Medicare Other

## 2020-02-09 ENCOUNTER — Ambulatory Visit
Admission: RE | Admit: 2020-02-09 | Discharge: 2020-02-09 | Disposition: A | Discharge status: Home / Self Care | Payer: Medicare Other | Source: Ambulatory Visit | Attending: Radiation Oncology | Admitting: Radiation Oncology

## 2020-02-09 DIAGNOSIS — C20 Malignant neoplasm of rectum: Secondary | ICD-10-CM | POA: Diagnosis not present

## 2020-02-09 NOTE — Telephone Encounter (Signed)
02/09/2020 Returned pts call from 02/08/20. Wanted to confirm appts for 02/10/20. Left VM with appt info and call back number in case there are any other questions SRW

## 2020-02-10 ENCOUNTER — Inpatient Hospital Stay: Payer: Medicare Other

## 2020-02-10 ENCOUNTER — Ambulatory Visit: Payer: Medicare Other

## 2020-02-10 ENCOUNTER — Ambulatory Visit: Admission: RE | Admit: 2020-02-10 | Payer: Medicare Other | Source: Ambulatory Visit

## 2020-02-10 ENCOUNTER — Encounter: Payer: Self-pay | Admitting: Oncology

## 2020-02-10 ENCOUNTER — Telehealth: Payer: Self-pay | Admitting: *Deleted

## 2020-02-10 ENCOUNTER — Inpatient Hospital Stay (HOSPITAL_BASED_OUTPATIENT_CLINIC_OR_DEPARTMENT_OTHER): Payer: Medicare Other | Admitting: Oncology

## 2020-02-10 VITALS — BP 171/71 | HR 73 | Temp 96.9°F | Resp 16

## 2020-02-10 VITALS — BP 197/88 | Temp 96.0°F | Wt 226.9 lb

## 2020-02-10 DIAGNOSIS — R112 Nausea with vomiting, unspecified: Secondary | ICD-10-CM

## 2020-02-10 DIAGNOSIS — C20 Malignant neoplasm of rectum: Secondary | ICD-10-CM

## 2020-02-10 DIAGNOSIS — Z5111 Encounter for antineoplastic chemotherapy: Secondary | ICD-10-CM | POA: Diagnosis not present

## 2020-02-10 DIAGNOSIS — R4189 Other symptoms and signs involving cognitive functions and awareness: Secondary | ICD-10-CM | POA: Diagnosis not present

## 2020-02-10 DIAGNOSIS — T451X5A Adverse effect of antineoplastic and immunosuppressive drugs, initial encounter: Secondary | ICD-10-CM

## 2020-02-10 LAB — CBC WITH DIFFERENTIAL/PLATELET
Abs Immature Granulocytes: 0.05 10*3/uL (ref 0.00–0.07)
Basophils Absolute: 0 10*3/uL (ref 0.0–0.1)
Basophils Relative: 0 %
Eosinophils Absolute: 0 10*3/uL (ref 0.0–0.5)
Eosinophils Relative: 0 %
HCT: 38.2 % — ABNORMAL LOW (ref 39.0–52.0)
Hemoglobin: 12.5 g/dL — ABNORMAL LOW (ref 13.0–17.0)
Immature Granulocytes: 1 %
Lymphocytes Relative: 8 %
Lymphs Abs: 0.7 10*3/uL (ref 0.7–4.0)
MCH: 29.3 pg (ref 26.0–34.0)
MCHC: 32.7 g/dL (ref 30.0–36.0)
MCV: 89.7 fL (ref 80.0–100.0)
Monocytes Absolute: 0.4 10*3/uL (ref 0.1–1.0)
Monocytes Relative: 5 %
Neutro Abs: 7.2 10*3/uL (ref 1.7–7.7)
Neutrophils Relative %: 86 %
Platelets: 193 10*3/uL (ref 150–400)
RBC: 4.26 MIL/uL (ref 4.22–5.81)
RDW: 13.4 % (ref 11.5–15.5)
WBC: 8.4 10*3/uL (ref 4.0–10.5)
nRBC: 0 % (ref 0.0–0.2)

## 2020-02-10 LAB — COMPREHENSIVE METABOLIC PANEL
ALT: 13 U/L (ref 0–44)
AST: 23 U/L (ref 15–41)
Albumin: 3.8 g/dL (ref 3.5–5.0)
Alkaline Phosphatase: 61 U/L (ref 38–126)
Anion gap: 10 (ref 5–15)
BUN: 26 mg/dL — ABNORMAL HIGH (ref 8–23)
CO2: 27 mmol/L (ref 22–32)
Calcium: 9.3 mg/dL (ref 8.9–10.3)
Chloride: 105 mmol/L (ref 98–111)
Creatinine, Ser: 1.71 mg/dL — ABNORMAL HIGH (ref 0.61–1.24)
GFR, Estimated: 43 mL/min — ABNORMAL LOW (ref >60)
Glucose, Bld: 143 mg/dL — ABNORMAL HIGH (ref 70–99)
Potassium: 3.7 mmol/L (ref 3.5–5.1)
Sodium: 142 mmol/L (ref 135–145)
Total Bilirubin: 0.7 mg/dL (ref 0.3–1.2)
Total Protein: 7.2 g/dL (ref 6.5–8.1)

## 2020-02-10 MED ORDER — SODIUM CHLORIDE 0.9% FLUSH
10.0000 mL | INTRAVENOUS | Status: DC | PRN
  Administered 2020-02-10: 10 mL via INTRAVENOUS
  Filled 2020-02-10: qty 10

## 2020-02-10 MED ORDER — SODIUM CHLORIDE 0.9 % IV SOLN
Freq: Once | INTRAVENOUS | Status: AC
  Filled 2020-02-10: qty 250

## 2020-02-10 MED ORDER — ONDANSETRON HCL 4 MG/2ML IJ SOLN
8.0000 mg | Freq: Once | INTRAMUSCULAR | Status: AC
  Administered 2020-02-10: 8 mg via INTRAVENOUS

## 2020-02-10 MED ORDER — DEXAMETHASONE SODIUM PHOSPHATE 10 MG/ML IJ SOLN
10.0000 mg | Freq: Once | INTRAMUSCULAR | Status: AC
  Administered 2020-02-10: 10 mg via INTRAVENOUS

## 2020-02-10 MED ORDER — HEPARIN SOD (PORK) LOCK FLUSH 100 UNIT/ML IV SOLN
500.0000 [IU] | Freq: Once | INTRAVENOUS | Status: AC
  Administered 2020-02-10: 500 [IU] via INTRAVENOUS
  Filled 2020-02-10: qty 5

## 2020-02-10 NOTE — Progress Notes (Signed)
Supportive meds given for vomiting.IVF hydration given. Attempting to discharge via [facility] through [place] transportation. Call placed to [facility] health Transportation  for pick up at 4:15. Driver finally arrived at 6pm to take pt to the [place] after many phonecalls.

## 2020-02-10 NOTE — Telephone Encounter (Signed)
Call to update [facility] on plan for patients care this afternoon. Pt received meds for N/V which did lower his BP. Vomiting has stopped. MD has ordered 1 liter of NS over 1 hour with plans for pt to be discharged to home. [name] told me to call Lucianne Lei when pt ready for pick up.

## 2020-02-10 NOTE — Progress Notes (Signed)
Hematology/Oncology Follow Up Note [place]  [phone] [phone]  Patient Care Team: [name], MD as PCP - General (Family Medicine) Borders, Kirt Boys, NP as Nurse Practitioner (Hospice and Palliative Medicine) [name], MD as Consulting Physician (Oncology) [name], MD as Referring Physician (Radiation Oncology) [name], NP as Nurse Practitioner [name], RN as Oncology Nurse Navigator [name], MD as Consulting Physician (Neurology) [name], MD as Consulting Physician (Gastroenterology) [name], MD as Consulting Physician (Psychiatry) [name], MD as Consulting Physician (Colon and Rectal Surgery)   Name of the patient: Christopher Delacruz  [id number]  August 15, 1950   REASON FOR VISIT  follow-up for rectal cancer  PERTINENT ONCOLOGY HISTORY Christopher Delacruz is a 70 y.o.[place] who has above oncology history reviewed by me today presented for follow up visit for management of locally advanced rectal cancer. -Initially presented to emergency room due to profound anemia. 09/11/2019 EGD showed duodenal erosions without bleeding 09/11/2019 colonoscopy preparation was poor.  Likely malignant partially obstructing tumor in the rectosigmoid colon area which was biopsied. 09/13/2019, repeat colonoscopy showed 12 mm polyps were found in the descending colon.  Polyp was sessile.  Polyp was resected and retrieved.  A polypoid ulcerated partially obstructing large mass was found from 10 to 20 cm proximal to the anus.  The mass was circumferential, measured 10 cm in length.  In addition the diameter measures 12 mm.  Oozing was present 09/11/2019, biopsy showed invasive colorectal adenocarcinoma.  Moderately differentiated. 09/13/2019, CT chest abdomen pelvis showed rectal wall mass with evidence of local invasion to the mesorectal fat and potential involvement of the right seminal vesicle.  Multiple  enlarged lymph nodes.  Small lung nodules 2 to 3 mm in the lung bilaterally.  Highly nonspecific.  Cannot rule out metastasis.  09/14/2019 MRI pelvis showed T4b N2 rectal cancer. 09/15/2019 MRI brain with and without contrast showed punctate focus of restricted diffusion most likely reflects acute or early subacute small vessel infarct.  No evidence of intracranial metastasis.  Multiple chronic infarct.  #Iron deficiency anemia, patient received PRBC transfusion and IV Venofer treatments. #Vitamin B12 deficiency, status post parenteral vitamin B12 injections during hospitalization.  Currently on vitamin B12 supplementation. #Patient was found to have very poor insight to his condition.  Was seen by psychiatrist and was deemed incompetent.  Patient's medical power of attorney is his nephew Christopher Delacruz.  #Mediport was placed by Dr. Dahlia Byes  # #H. pylori gastritis, patient finishes course of antibiotics and PPI # 09/26/2019- 01/10/20  FOLFOX x 8  01/19/2020, CT chest abdomen pelvis with contrast showed mild interval decrease in size of the circumferential mass involving the rectum.  Stable appearance borderline enlarged porta hepatic and aorto caval lymph nodes.  No new or progressive disease.Scattered punctated lung nodules, unchanged.  Clot versus fibrin sheath surrounding the intravascular component of the right portacatheter.  No report of Mediport malfunctioning and he is asymptomatic.  Monitor.  INTERVA L HISTORY 70 y.o. patient presents for follow-up of rectal cancer..  Accompanied by Christopher Delacruz. Patient has had a few episodes of vomiting, 4-5 times. Also reports diarrhea. Unclear if diarrhea is worse than his chronic level. No fever chills.    Review of Systems  Unable to perform ROS: Other (Cognitive impairment)  Constitutional: Negative for fatigue.  Respiratory: Negative for shortness of breath.   Gastrointestinal: Negative for abdominal pain, blood in stool and constipation.      No Known  Allergies  Past Medical History:  Diagnosis Date  . Acute renal failure superimposed on stage 3a chronic kidney disease ([place]) 09/07/2019  . B12 deficiency 09/17/2019  . Cancer ([place])   . CVA (cerebral vascular accident) ([place])   . Diabetes mellitus ([place])   . Hyperlipidemia   . Hypertension   . Right hemiparesis ([place]) 2017   Associated with stroke     Past Surgical History:  Procedure Laterality Date  . COLONOSCOPY N/A 09/11/2019   Procedure: COLONOSCOPY;  Surgeon: [name], MD;  Location[facility] ENDOSCOPY;  Service: Endoscopy;  Laterality: N/A;  . COLONOSCOPY WITH PROPOFOL N/A 09/12/2019   Procedure: COLONOSCOPY WITH PROPOFOL;  Surgeon: [name], MD;  Location: [facility] ENDOSCOPY;  Service: Gastroenterology;  Laterality: N/A;  . COLONOSCOPY WITH PROPOFOL N/A 09/13/2019   Procedure: COLONOSCOPY WITH PROPOFOL;  Surgeon: [name], MD;  Location: [facility] ENDOSCOPY;  Service: Gastroenterology;  Laterality: N/A;  . ESOPHAGOGASTRODUODENOSCOPY N/A 09/11/2019   Procedure: ESOPHAGOGASTRODUODENOSCOPY (EGD);  Surgeon: [name], MD;  Location: [facility] ENDOSCOPY;  Service: Endoscopy;  Laterality: N/A;  . EXPLORE EYE SOCKET    . PORTACATH PLACEMENT N/A 09/15/2019   Procedure: INSERTION PORT-A-CATH;  Surgeon: Jules Husbands, MD;  Location: [facility] ORS;  Service: General;  Laterality: N/A;    Social History   Socioeconomic History  . Marital status: Divorced    Spouse name: Not on file  . Number of children: Not on file  . Years of education: Not on file  . Highest education level: Not on file  Occupational History  . 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 Smoking status: Never Smoker  . Smokeless tobacco: Never Used  Vaping Use  . Vaping Use: Never used  Substance and Sexual Activity  . Alcohol use: Never    Alcohol/week: 0.0 standard drinks  . Drug use: Never  . Sexual activity: Not on file  Other Topics Concern   . Not on file  Social History Narrative   Nephew with [place] due to cognitive impairment of patient after stroke 2017      Lives in [place] in [place], [place]   Social Determinants of Health   Financial Resource Strain: Not on file  Food Insecurity: Not on file  Transportation Needs: Unmet Transportation Needs  . Lack of Transportation (Medical): Yes  . Lack of Transportation (Non-Medical): Yes  Physical Activity: Not on file  Stress: Not on file  Social Connections: Not on file  Intimate Partner Violence: Not on file    Family History  Problem Relation Age of Onset  . Heart attack Sister   . Leukemia Brother   . Stroke Brother      Current Outpatient Medications:  .  amLODipine (NORVASC) 10 MG tablet, Take 1 tablet (10 mg total) by mouth daily., Disp: 30 tablet, Rfl: 1 .  aspirin EC 81 MG EC tablet, Take 1 tablet (81 mg total) by mouth daily. Swallow whole., Disp: 30 tablet, Rfl: 11 .  capecitabine (XELODA) 500 MG tablet, Take 3 tablets (1,500 mg total) by mouth 2 (two) times daily after a meal. Take Monday through Friday during radiation., Disp: 150 tablet, Rfl: 0 .  clopidogrel (PLAVIX) 75 MG tablet, Take 1 tablet (75 mg total) by mouth daily., Disp: 30 tablet, Rfl: 1 .  ferrous sulfate 325 (65 FE) MG tablet, Take 1 tablet (325 mg total) by mouth daily., Disp: 30  tablet, Rfl: 3 .  finasteride (PROSCAR) 5 MG tablet, Take 5 mg by mouth daily., Disp: , Rfl:  .  multivitamin (ONE-A-DAY MEN'S) TABS tablet, Take 1 tablet by mouth daily., Disp: , Rfl:  .  ondansetron (ZOFRAN) 8 MG tablet, Take 1 tablet (8 mg total) by mouth 2 (two) times daily as needed for refractory nausea / vomiting. Start on day 3 after chemotherapy., Disp: 30 tablet, Rfl: 1 .  vitamin B-12 (CYANOCOBALAMIN) 1000 MCG tablet, Take 1,000 mcg by mouth daily., Disp: , Rfl:  .  nystatin (MYCOSTATIN) 100000 UNIT/ML suspension, Take 5 mLs by mouth 4 (four) times daily. Swish and  spit  Reported by [place] (Patient not taking: Reported on 02/10/2020), Disp: , Rfl:  .  prochlorperazine (COMPAZINE) 10 MG tablet, Take 1 tablet (10 mg total) by mouth every 6 (six) hours as needed (Nausea or vomiting). (Patient not taking: No sig reported), Disp: 30 tablet, Rfl: 1 No current facility-administered medications for this visit.  Facility-Administered Medications Ordered in Other Visits:  .  sodium chloride flush (NS) 0.9 % injection 10 mL, 10 mL, Intravenous, PRN, [name], MD, 10 mL at 02/10/20 1430  Physical exam:  Vitals:   02/10/20 1356 02/10/20 1358  BP: (!) 226/99 (!) 197/88  Temp: (!) 96 F (35.6 C)   TempSrc: Tympanic   Weight: 226 lb 14.4 oz (102.9 kg)    Physical Exam Constitutional:      General: He is not in acute distress. HENT:     Head: Normocephalic and atraumatic.  Eyes:     General: No scleral icterus. Cardiovascular:     Rate and Rhythm: Normal rate and regular rhythm.     Heart sounds: Normal heart sounds.  Pulmonary:     Effort: Pulmonary effort is normal. No respiratory distress.     Breath sounds: No wheezing.  Abdominal:     General: Bowel sounds are normal. There is no distension.     Palpations: Abdomen is soft.  Musculoskeletal:        General: No deformity. Normal range of motion.     Cervical back: Normal range of motion and neck supple.  Skin:    General: Skin is warm and dry.     Findings: No erythema or rash.  Neurological:     Mental Status: He is alert. Mental status is at baseline.     Cranial Nerves: No cranial nerve deficit.     Coordination: Coordination normal.     Comments: Orientated x2  Psychiatric:        Mood and Affect: Mood normal.     CMP Latest Ref Rng & Units 02/10/2020  Glucose 70 - 99 mg/dL 143(H)  BUN 8 - 23 mg/dL 26(H)  Creatinine 0.61 - 1.24 mg/dL 1.71(H)  Sodium 135 - 145 mmol/L 142  Potassium 3.5 - 5.1 mmol/L 3.7  Chloride 98 - 111 mmol/L 105  CO2 22 - 32 mmol/L 27  Calcium 8.9 - 10.3  mg/dL 9.3  Total Protein 6.5 - 8.1 g/dL 7.2  Total Bilirubin 0.3 - 1.2 mg/dL 0.7  Alkaline Phos 38 - 126 U/L 61  AST 15 - 41 U/L 23  ALT 0 - 44 U/L 13   CBC Latest Ref Rng & Units 02/10/2020  WBC 4.0 - 10.5 K/uL 8.4  Hemoglobin 13.0 - 17.0 g/dL 12.5(L)  Hematocrit 39.0 - 52.0 % 38.2(L)  Platelets 150 - 400 K/uL 193    RADIOGRAPHIC STUDIES: I have personally reviewed the radiological images  as listed and agreed with the findings in the report. CT CHEST ABDOMEN PELVIS W CONTRAST  Result Date: 01/19/2020 CLINICAL DATA:  Colorectal carcinoma.  Restaging. EXAM: CT CHEST, ABDOMEN, AND PELVIS WITH CONTRAST TECHNIQUE: Multidetector CT imaging of the chest, abdomen and pelvis was performed following the standard protocol during bolus administration of intravenous contrast. CONTRAST:  67mL OMNIPAQUE IOHEXOL 300 MG/ML  SOLN COMPARISON:  09/13/2019 FINDINGS: CT CHEST FINDINGS Cardiovascular: The heart size appears within normal limits. Aortic atherosclerosis. No pericardial effusion. Coronary artery calcifications. Right chest wall port a catheter is identified. Clot versus fibrin sheath surrounds the intravascular component of the right porta catheter at the level of the right internal jugular vein, image 1/2. Mediastinum/Nodes: Normal appearance of the thyroid gland. The trachea appears patent and is midline. Normal appearance of the esophagus. The no enlarged axillary, supraclavicular, mediastinal, or hilar lymph nodes. Lungs/Pleura: No pleural effusion. No airspace consolidation, atelectasis or pneumothorax. A few scattered punctate lung nodules are again noted. These appear unchanged from previous exam. No new suspicious lung nodules. Musculoskeletal: No acute or suspicious osseous findings. CT ABDOMEN PELVIS FINDINGS Hepatobiliary: No focal liver abnormality is seen. No gallstones, gallbladder wall thickening, or biliary dilatation. Pancreas: Unremarkable. No pancreatic ductal dilatation or surrounding  inflammatory changes. Spleen: Normal in size without focal abnormality. Adrenals/Urinary Tract: Adrenal glands are unremarkable. Kidneys are normal, without renal calculi, focal lesion, or hydronephrosis. Bladder is unremarkable. Stomach/Bowel: Stomach appears normal. The appendix is visualized and is normal. No small bowel wall thickening, inflammation or distension. Circumferential mass involving the rectum measures 5.0 x 3.2 cm, image 106/6. This is compared with 5.2 x 4.1 cm previously. No signs of obstruction. Vascular/Lymphatic: Aortic atherosclerosis. No aneurysm. Porta hepatic lymph node has a short axis of 1.3 cm, image 59/2. Unchanged low-density aortocaval lymph node at the level of the left renal vein measures 1 cm, image 69/2. Also unchanged. No pelvic or inguinal adenopathy. Reproductive: Prostate is unremarkable. Other: No free fluid or fluid collections. Musculoskeletal: Degenerative disc disease identified within the lumbar spine. No acute or suspicious osseous findings. IMPRESSION: 1. Mild interval decrease in size of circumferential mass involving the rectum. 2. Stable appearance of borderline enlarged porta hepatic and aortocaval lymph nodes. 3. No new or progressive disease identified. 4. Unchanged appearance of scattered, punctate lung nodules which are nonspecific and favored to represent a benign process. 5. Clot versus fibrin sheath surrounds the intravascular component of the right porta catheter at the level of the right internal jugular vein. 6. Aortic atherosclerosis and coronary artery calcifications. Aortic Atherosclerosis (ICD10-I70.0). Electronically Signed   By: Kerby Moors M.D.   On: 01/19/2020 10:37     Assessment and plan 1. Rectal cancer ([place])   2. Encounter for antineoplastic chemotherapy   3. Cognitive impairment   4. Chemotherapy induced nausea and vomiting   Cancer Staging Rectal cancer [facility]) Staging form: Colon and Rectum, AJCC 8th Edition - Clinical stage  from 09/22/2019: Stage IIIC (cT4b, cN2, cM0) - Signed by [name], MD on 09/22/2019  Stage IIIC rectal cancer cT4 N2 M0 rectal cancer Labs are reviewed and discussed with patient. Hold Xeloda today and on 02/13/20.   # Chemotherapy induced nausea and vomiting Hold xeloda.  IV Zofran 8mg  x 1, IV dexamethasone 10mg  x 1 IVF 1L NS x 1.    #Chronic kidney disease, creatinine is stable at 1.76  #Intermittent loose stool bowel movements. Hold xeloda today.   Anemia, hemoglobin is 12.5 .  Stable. Vitamin B12 deficiency, continue  B12 supplementation. 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  Continue monitor.  We spent sufficient time to discuss many aspect of care, questions were answered to patient's satisfaction. Follow up in 1 week   [name], MD, PhD Hematology Oncology [facility] at [facility] Pager- [phone] 02/10/2020

## 2020-02-13 ENCOUNTER — Ambulatory Visit: Payer: Medicare Other

## 2020-02-13 ENCOUNTER — Inpatient Hospital Stay: Payer: Medicare Other

## 2020-02-14 ENCOUNTER — Inpatient Hospital Stay: Payer: Medicare Other

## 2020-02-14 ENCOUNTER — Ambulatory Visit: Payer: Medicare Other

## 2020-02-15 ENCOUNTER — Other Ambulatory Visit: Payer: Self-pay

## 2020-02-15 ENCOUNTER — Non-Acute Institutional Stay: Payer: Medicare Other | Admitting: Adult Health Nurse Practitioner

## 2020-02-15 ENCOUNTER — Inpatient Hospital Stay: Payer: Medicare Other

## 2020-02-15 ENCOUNTER — Ambulatory Visit
Admission: RE | Admit: 2020-02-15 | Discharge: 2020-02-15 | Disposition: A | Discharge status: Home / Self Care | Payer: Medicare Other | Source: Ambulatory Visit | Attending: Radiation Oncology | Admitting: Radiation Oncology

## 2020-02-15 DIAGNOSIS — Z515 Encounter for palliative care: Secondary | ICD-10-CM

## 2020-02-15 DIAGNOSIS — C20 Malignant neoplasm of rectum: Secondary | ICD-10-CM | POA: Diagnosis not present

## 2020-02-15 DIAGNOSIS — C19 Malignant neoplasm of rectosigmoid junction: Secondary | ICD-10-CM

## 2020-02-15 DIAGNOSIS — F0150 Vascular dementia without behavioral disturbance: Secondary | ICD-10-CM

## 2020-02-15 NOTE — Progress Notes (Signed)
[place] Consult Note Telephone: [phone]  Fax: [phone]  PATIENT NAME: Christopher Delacruz DOB: August 28, 1950 MRN: [id number]  PRIMARY CARE PROVIDER:  Thea Gist NP [facility])  REFERRING PROVIDER:  Thea Gist NP [facility])  [place], nephew/HCPOA [phone]  Chief complaint: follow up palliative visit/assess for symptoms related to cancer treatment   RECOMMENDATIONS and PLAN:  1.  Advanced care planning.  Patient is full code.  Spoke with nephew via phone to update on visit.  2.  Colorectal cancer.  Patient did have some N/V associated with chemo but this has resolved.  Undergoing radiation and have started discussions about colostomy.  Continue follow up and recommendations by oncology  3.  Dementia.  Patient ambulates without assistance.  Does require cueing for ADLs.  Is continent of B&B but wears briefs for occasional accidents.  Has had 5 pound weight loss most likely due to N/V from chemo.  States that his appetite is better.  Continue supportive care at the facility.   Palliative will continue to monitor for symptom management/decline and make recommendations as needed.  Next visit in 3 weeks to further discuss surgery for colostomy.  Nephew encouraged to call with any questions or concerns.   HISTORY OF PRESENT ILLNESS:  Christopher Delacruz is a 70 y.o. year old male with multiple medical problems including colorectal cancer, CKD stage 3, anemia,vascular dementia, h/o CVA with right side weakness. Palliative Care was asked to help address goals of care. Have reviewed medical records and latest labs and imaging. Spoke with nephew who states that chemo is done and his uncle is now undergoing radiation that is to end about mid February. States that oncologist has started discussion about surgery for colostomy.  States that his uncle has commented that he does not want to do surgery and that he does not want to have  a colostomy bag.  Will have continued conversations about this.  Patient has had chemo induced N/V but does not complain of any today.  Patient has not had any falls, infections, or hospital visits since last visit.  ROS may be inaccurate due to dementia.  Nephew able to help provide more info on HPI/ROS.    CODE STATUS: full code  PPS: 50% HOSPICE ELIGIBILITY/DIAGNOSIS: TBD  PHYSICAL EXAM:  HR 72  O2 98% on RA General: patient sitting on bed in NAD Eyes: sclera anicteric and noninjected with no discharge noted Cardiovascular: regular rate and rhythm Pulmonary:lung sounds clear; normal respiratory effort Abdomen: soft, nontender, + bowel sounds Extremities: no edema, no joint deformities Skin: no rasheson exposed skin Neurological: Weakness; A&O to person and place  PAST MEDICAL HISTORY:  Past Medical History:  Diagnosis Date  . Acute renal failure superimposed on stage 3a chronic kidney disease ([place]) 09/07/2019  . B12 deficiency 09/17/2019  . Cancer ([place])   . CVA (cerebral vascular accident) ([place])   . Diabetes mellitus ([place])   . Hyperlipidemia   . Hypertension   . Right hemiparesis ([place]) 2017   Associated with stroke    SOCIAL HX:  Social History   Tobacco Use  . Smoking status: Never Smoker  . Smokeless tobacco: Never Used  Substance Use Topics  . Alcohol use: Never    Alcohol/week: 0.0 standard drinks    ALLERGIES: No Known Allergies   PERTINENT MEDICATIONS:  Outpatient Encounter Medications as of 02/15/2020  Medication Sig  . amLODipine (NORVASC) 10 MG tablet Take 1 tablet (10 mg total) by mouth daily.  Marland Kitchen  aspirin EC 81 MG EC tablet Take 1 tablet (81 mg total) by mouth daily. Swallow whole.  . capecitabine (XELODA) 500 MG tablet Take 3 tablets (1,500 mg total) by mouth 2 (two) times daily after a meal. Take Monday through Friday during radiation.  . clopidogrel (PLAVIX) 75 MG tablet Take 1 tablet (75 mg total) by mouth daily.  . ferrous sulfate 325 (65 FE) MG  tablet Take 1 tablet (325 mg total) by mouth daily.  . finasteride (PROSCAR) 5 MG tablet Take 5 mg by mouth daily.  . multivitamin (ONE-A-DAY MEN'S) TABS tablet Take 1 tablet by mouth daily.  Marland Kitchen nystatin (MYCOSTATIN) 100000 UNIT/ML suspension Take 5 mLs by mouth 4 (four) times daily. Swish and spit  Reported by [place] (Patient not taking: Reported on 02/10/2020)  . ondansetron (ZOFRAN) 8 MG tablet Take 1 tablet (8 mg total) by mouth 2 (two) times daily as needed for refractory nausea / vomiting. Start on day 3 after chemotherapy.  . prochlorperazine (COMPAZINE) 10 MG tablet Take 1 tablet (10 mg total) by mouth every 6 (six) hours as needed (Nausea or vomiting). (Patient not taking: No sig reported)  . vitamin B-12 (CYANOCOBALAMIN) 1000 MCG tablet Take 1,000 mcg by mouth daily.   No facility-administered encounter medications on file as of 02/15/2020.     Mehki Klumpp Jenetta Downer, NP

## 2020-02-16 ENCOUNTER — Ambulatory Visit
Admission: RE | Admit: 2020-02-16 | Discharge: 2020-02-16 | Disposition: A | Discharge status: Home / Self Care | Payer: Medicare Other | Source: Ambulatory Visit | Attending: Radiation Oncology | Admitting: Radiation Oncology

## 2020-02-16 ENCOUNTER — Inpatient Hospital Stay: Payer: Medicare Other

## 2020-02-16 DIAGNOSIS — C20 Malignant neoplasm of rectum: Secondary | ICD-10-CM | POA: Diagnosis not present

## 2020-02-17 ENCOUNTER — Ambulatory Visit
Admission: RE | Admit: 2020-02-17 | Discharge: 2020-02-17 | Disposition: A | Discharge status: Home / Self Care | Payer: Medicare Other | Source: Ambulatory Visit | Attending: Radiation Oncology | Admitting: Radiation Oncology

## 2020-02-17 ENCOUNTER — Inpatient Hospital Stay: Payer: Medicare Other

## 2020-02-17 ENCOUNTER — Inpatient Hospital Stay (HOSPITAL_BASED_OUTPATIENT_CLINIC_OR_DEPARTMENT_OTHER): Payer: Medicare Other | Admitting: Oncology

## 2020-02-17 ENCOUNTER — Encounter: Payer: Self-pay | Admitting: Oncology

## 2020-02-17 VITALS — BP 140/81 | HR 50 | Temp 96.7°F | Resp 18 | Wt 235.9 lb

## 2020-02-17 VITALS — BP 130/75 | HR 65 | Temp 96.1°F | Resp 18

## 2020-02-17 DIAGNOSIS — E860 Dehydration: Secondary | ICD-10-CM

## 2020-02-17 DIAGNOSIS — C20 Malignant neoplasm of rectum: Secondary | ICD-10-CM | POA: Diagnosis not present

## 2020-02-17 DIAGNOSIS — Z5111 Encounter for antineoplastic chemotherapy: Secondary | ICD-10-CM

## 2020-02-17 LAB — CBC WITH DIFFERENTIAL/PLATELET
Abs Immature Granulocytes: 0.02 10*3/uL (ref 0.00–0.07)
Basophils Absolute: 0 10*3/uL (ref 0.0–0.1)
Basophils Relative: 1 %
Eosinophils Absolute: 0.1 10*3/uL (ref 0.0–0.5)
Eosinophils Relative: 2 %
HCT: 35.4 % — ABNORMAL LOW (ref 39.0–52.0)
Hemoglobin: 11.8 g/dL — ABNORMAL LOW (ref 13.0–17.0)
Immature Granulocytes: 0 %
Lymphocytes Relative: 14 %
Lymphs Abs: 0.9 10*3/uL (ref 0.7–4.0)
MCH: 29.9 pg (ref 26.0–34.0)
MCHC: 33.3 g/dL (ref 30.0–36.0)
MCV: 89.6 fL (ref 80.0–100.0)
Monocytes Absolute: 0.7 10*3/uL (ref 0.1–1.0)
Monocytes Relative: 11 %
Neutro Abs: 4.4 10*3/uL (ref 1.7–7.7)
Neutrophils Relative %: 72 %
Platelets: 167 10*3/uL (ref 150–400)
RBC: 3.95 MIL/uL — ABNORMAL LOW (ref 4.22–5.81)
RDW: 13.5 % (ref 11.5–15.5)
WBC: 6.1 10*3/uL (ref 4.0–10.5)
nRBC: 0 % (ref 0.0–0.2)

## 2020-02-17 LAB — COMPREHENSIVE METABOLIC PANEL
ALT: 17 U/L (ref 0–44)
AST: 37 U/L (ref 15–41)
Albumin: 3.6 g/dL (ref 3.5–5.0)
Alkaline Phosphatase: 58 U/L (ref 38–126)
Anion gap: 5 (ref 5–15)
BUN: 18 mg/dL (ref 8–23)
CO2: 30 mmol/L (ref 22–32)
Calcium: 9.1 mg/dL (ref 8.9–10.3)
Chloride: 106 mmol/L (ref 98–111)
Creatinine, Ser: 1.64 mg/dL — ABNORMAL HIGH (ref 0.61–1.24)
GFR, Estimated: 45 mL/min — ABNORMAL LOW (ref >60)
Glucose, Bld: 98 mg/dL (ref 70–99)
Potassium: 4 mmol/L (ref 3.5–5.1)
Sodium: 141 mmol/L (ref 135–145)
Total Bilirubin: 0.8 mg/dL (ref 0.3–1.2)
Total Protein: 7 g/dL (ref 6.5–8.1)

## 2020-02-17 MED ORDER — SODIUM CHLORIDE 0.9 % IV SOLN
Freq: Once | INTRAVENOUS | Status: AC
  Filled 2020-02-17: qty 250

## 2020-02-17 MED ORDER — HEPARIN SOD (PORK) LOCK FLUSH 100 UNIT/ML IV SOLN
500.0000 [IU] | Freq: Once | INTRAVENOUS | Status: AC
  Administered 2020-02-17: 500 [IU] via INTRAVENOUS
  Filled 2020-02-17: qty 5

## 2020-02-17 MED ORDER — SODIUM CHLORIDE 0.9% FLUSH
10.0000 mL | INTRAVENOUS | Status: DC | PRN
  Administered 2020-02-17: 10 mL via INTRAVENOUS
  Filled 2020-02-17: qty 10

## 2020-02-17 NOTE — Progress Notes (Signed)
Hematology/Oncology Follow Up Note [place]  [phone] [phone]  Patient Care Team: [name], MD as PCP - General (Family Medicine) Borders, Kirt Boys, NP as Nurse Practitioner (Hospice and Palliative Medicine) [name], MD as Consulting Physician (Oncology) [name], MD as Referring Physician (Radiation Oncology) [name], NP as Nurse Practitioner [name], RN as Oncology Nurse Navigator [name], MD as Consulting Physician (Neurology) [name], MD as Consulting Physician (Gastroenterology) [name], MD as Consulting Physician (Psychiatry) [name], MD as Consulting Physician (Colon and Rectal Surgery)   Name of the patient: Christopher Delacruz  [id number]  08/26/1950   REASON FOR VISIT  follow-up for rectal cancer  PERTINENT ONCOLOGY HISTORY Christopher Delacruz is a 70 y.o.[place] who has above oncology history reviewed by me today presented for follow up visit for management of locally advanced rectal cancer. -Initially presented to emergency room due to profound anemia. 09/11/2019 EGD showed duodenal erosions without bleeding 09/11/2019 colonoscopy preparation was poor.  Likely malignant partially obstructing tumor in the rectosigmoid colon area which was biopsied. 09/13/2019, repeat colonoscopy showed 12 mm polyps were found in the descending colon.  Polyp was sessile.  Polyp was resected and retrieved.  A polypoid ulcerated partially obstructing large mass was found from 10 to 20 cm proximal to the anus.  The mass was circumferential, measured 10 cm in length.  In addition the diameter measures 12 mm.  Oozing was present 09/11/2019, biopsy showed invasive colorectal adenocarcinoma.  Moderately differentiated. 09/13/2019, CT chest abdomen pelvis showed rectal wall mass with evidence of local invasion to the mesorectal fat and potential involvement of the right seminal vesicle.  Multiple  enlarged lymph nodes.  Small lung nodules 2 to 3 mm in the lung bilaterally.  Highly nonspecific.  Cannot rule out metastasis.  09/14/2019 MRI pelvis showed T4b N2 rectal cancer. 09/15/2019 MRI brain with and without contrast showed punctate focus of restricted diffusion most likely reflects acute or early subacute small vessel infarct.  No evidence of intracranial metastasis.  Multiple chronic infarct.  #Iron deficiency anemia, patient received PRBC transfusion and IV Venofer treatments. #Vitamin B12 deficiency, status post parenteral vitamin B12 injections during hospitalization.  Currently on vitamin B12 supplementation. #Patient was found to have very poor insight to his condition.  Was seen by psychiatrist and was deemed incompetent.  Patient's medical power of attorney is his nephew Christopher Delacruz.  #Mediport was placed by Dr. Dahlia Delacruz  # #H. pylori gastritis, patient finishes course of antibiotics and PPI # 09/26/2019- 01/10/20  FOLFOX x 8  01/19/2020, CT chest abdomen pelvis with contrast showed mild interval decrease in size of the circumferential mass involving the rectum.  Stable appearance borderline enlarged porta hepatic and aorto caval lymph nodes.  No new or progressive disease.Scattered punctated lung nodules, unchanged.  Clot versus fibrin sheath surrounding the intravascular component of the right portacatheter.  No report of Mediport malfunctioning and he is asymptomatic.  Monitor.  INTERVA L HISTORY 70 y.o. patient presents for follow-up of rectal cancer.Marland Kitchen  He reports feeling well. No nausea vomiting. Chronic intermittent diarrhea. He is here by himself today.    Review of Systems  Unable to perform ROS: Other (Cognitive impairment)  Constitutional: Negative for fatigue.  Respiratory: Negative for shortness of breath.   Gastrointestinal: Negative for abdominal pain, blood in stool and constipation.      No Known Allergies   Past Medical History:  Diagnosis Date  . Acute renal  failure superimposed on  stage 3a chronic kidney disease ([place]) 09/07/2019  . B12 deficiency 09/17/2019  . Cancer ([place])   . CVA (cerebral vascular accident) ([place])   . Diabetes mellitus ([place])   . Hyperlipidemia   . Hypertension   . Right hemiparesis ([place]) 2017   Associated with stroke     Past Surgical History:  Procedure Laterality Date  . COLONOSCOPY N/A 09/11/2019   Procedure: COLONOSCOPY;  Surgeon: [name], MD;  Location[facility] ENDOSCOPY;  Service: Endoscopy;  Laterality: N/A;  . COLONOSCOPY WITH PROPOFOL N/A 09/12/2019   Procedure: COLONOSCOPY WITH PROPOFOL;  Surgeon: [name], MD;  Location: [facility] ENDOSCOPY;  Service: Gastroenterology;  Laterality: N/A;  . COLONOSCOPY WITH PROPOFOL N/A 09/13/2019   Procedure: COLONOSCOPY WITH PROPOFOL;  Surgeon: [name], MD;  Location: [facility] ENDOSCOPY;  Service: Gastroenterology;  Laterality: N/A;  . ESOPHAGOGASTRODUODENOSCOPY N/A 09/11/2019   Procedure: ESOPHAGOGASTRODUODENOSCOPY (EGD);  Surgeon: [name], MD;  Location: [facility] ENDOSCOPY;  Service: Endoscopy;  Laterality: N/A;  . EXPLORE EYE SOCKET    . PORTACATH PLACEMENT N/A 09/15/2019   Procedure: INSERTION PORT-A-CATH;  Surgeon: Jules Husbands, MD;  Location: [facility] ORS;  Service: General;  Laterality: N/A;    Social History   Socioeconomic History  . Marital status: Divorced    Spouse name: Not on file  . Number of children: Not on file  . Years of education: Not on file  . Highest education level: Not on file  Occupational History  . 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 Smoking status: Never Smoker  . Smokeless tobacco: Never Used  Vaping Use  . Vaping Use: Never used  Substance and Sexual Activity  . Alcohol use: Never    Alcohol/week: 0.0 standard drinks  . Drug use: Never  . Sexual activity: Not on file  Other Topics Concern  . Not on file  Social History Narrative   Nephew with [place] due to cognitive impairment of patient after stroke 2017      Lives in [place] in [place], [place]   Social Determinants of Health   Financial Resource Strain: Not on file  Food Insecurity: Not on file  Transportation Needs: Unmet Transportation Needs  . Lack of Transportation (Medical): Yes  . Lack of Transportation (Non-Medical): Yes  Physical Activity: Not on file  Stress: Not on file  Social Connections: Not on file  Intimate Partner Violence: Not on file    Family History  Problem Relation Age of Onset  . Heart attack Sister   . Leukemia Brother   . Stroke Brother      Current Outpatient Medications:  .  amLODipine (NORVASC) 10 MG tablet, Take 1 tablet (10 mg total) by mouth daily., Disp: 30 tablet, Rfl: 1 .  aspirin EC 81 MG EC tablet, Take 1 tablet (81 mg total) by mouth daily. Swallow whole., Disp: 30 tablet, Rfl: 11 .  capecitabine (XELODA) 500 MG tablet, Take 3 tablets (1,500 mg total) by mouth 2 (two) times daily after a meal. Take Monday through Friday during radiation., Disp: 150 tablet, Rfl: 0 .  clopidogrel (PLAVIX) 75 MG tablet, Take 1 tablet (75 mg total) by mouth daily., Disp: 30 tablet, Rfl: 1 .  ferrous sulfate 325 (65 FE) MG tablet, Take 1 tablet (325 mg total) by mouth daily., Disp: 30 tablet, Rfl: 3 .  finasteride (PROSCAR) 5 MG tablet, Take 5 mg  by mouth daily., Disp: , Rfl:  .  multivitamin (ONE-A-DAY MEN'S) TABS tablet, Take 1 tablet by mouth daily., Disp: , Rfl:  .  nystatin (MYCOSTATIN) 100000 UNIT/ML suspension, Take 5 mLs by mouth 4 (four) times daily. Swish and spit  Reported by [place] (Patient not taking: No sig reported), Disp: , Rfl:  .  ondansetron (ZOFRAN) 8 MG tablet, Take 1 tablet (8 mg total) by mouth 2 (two) times daily as needed for refractory nausea / vomiting. Start on day 3 after chemotherapy., Disp: 30 tablet, Rfl: 1 .  prochlorperazine (COMPAZINE) 10 MG tablet, Take 1 tablet (10 mg total) by  mouth every 6 (six) hours as needed (Nausea or vomiting). (Patient not taking: No sig reported), Disp: 30 tablet, Rfl: 1 .  vitamin B-12 (CYANOCOBALAMIN) 1000 MCG tablet, Take 1,000 mcg by mouth daily., Disp: , Rfl:   Physical exam:  Vitals:   02/17/20 1232  BP: 140/81  Pulse: (!) 50  Resp: 18  Temp: (!) 96.7 F (35.9 C)  Weight: 235 lb 14.4 oz (107 kg)   Physical Exam Constitutional:      General: He is not in acute distress. HENT:     Head: Normocephalic and atraumatic.  Eyes:     General: No scleral icterus. Cardiovascular:     Rate and Rhythm: Normal rate and regular rhythm.     Heart sounds: Normal heart sounds.  Pulmonary:     Effort: Pulmonary effort is normal. No respiratory distress.     Breath sounds: No wheezing.  Abdominal:     General: Bowel sounds are normal. There is no distension.     Palpations: Abdomen is soft.  Musculoskeletal:        General: No deformity. Normal range of motion.     Cervical back: Normal range of motion and neck supple.  Skin:    General: Skin is warm and dry.     Findings: No erythema or rash.  Neurological:     Mental Status: He is alert. Mental status is at baseline.     Cranial Nerves: No cranial nerve deficit.     Coordination: Coordination normal.     Comments: Orientated x2  Psychiatric:        Mood and Affect: Mood normal.     CMP Latest Ref Rng & Units 02/17/2020  Glucose 70 - 99 mg/dL 98  BUN 8 - 23 mg/dL 18  Creatinine 0.61 - 1.24 mg/dL 1.64(H)  Sodium 135 - 145 mmol/L 141  Potassium 3.5 - 5.1 mmol/L 4.0  Chloride 98 - 111 mmol/L 106  CO2 22 - 32 mmol/L 30  Calcium 8.9 - 10.3 mg/dL 9.1  Total Protein 6.5 - 8.1 g/dL 7.0  Total Bilirubin 0.3 - 1.2 mg/dL 0.8  Alkaline Phos 38 - 126 U/L 58  AST 15 - 41 U/L 37  ALT 0 - 44 U/L 17   CBC Latest Ref Rng & Units 02/17/2020  WBC 4.0 - 10.5 K/uL 6.1  Hemoglobin 13.0 - 17.0 g/dL 11.8(L)  Hematocrit 39.0 - 52.0 % 35.4(L)  Platelets 150 - 400 K/uL 167    RADIOGRAPHIC  STUDIES: I have personally reviewed the radiological images as listed and agreed with the findings in the report. CT CHEST ABDOMEN PELVIS W CONTRAST  Result Date: 01/19/2020 CLINICAL DATA:  Colorectal carcinoma.  Restaging. EXAM: CT CHEST, ABDOMEN, AND PELVIS WITH CONTRAST TECHNIQUE: Multidetector CT imaging of the chest, abdomen and pelvis was performed following the standard protocol during bolus administration of  intravenous contrast. CONTRAST:  67mL OMNIPAQUE IOHEXOL 300 MG/ML  SOLN COMPARISON:  09/13/2019 FINDINGS: CT CHEST FINDINGS Cardiovascular: The heart size appears within normal limits. Aortic atherosclerosis. No pericardial effusion. Coronary artery calcifications. Right chest wall port a catheter is identified. Clot versus fibrin sheath surrounds the intravascular component of the right porta catheter at the level of the right internal jugular vein, image 1/2. Mediastinum/Nodes: Normal appearance of the thyroid gland. The trachea appears patent and is midline. Normal appearance of the esophagus. The no enlarged axillary, supraclavicular, mediastinal, or hilar lymph nodes. Lungs/Pleura: No pleural effusion. No airspace consolidation, atelectasis or pneumothorax. A few scattered punctate lung nodules are again noted. These appear unchanged from previous exam. No new suspicious lung nodules. Musculoskeletal: No acute or suspicious osseous findings. CT ABDOMEN PELVIS FINDINGS Hepatobiliary: No focal liver abnormality is seen. No gallstones, gallbladder wall thickening, or biliary dilatation. Pancreas: Unremarkable. No pancreatic ductal dilatation or surrounding inflammatory changes. Spleen: Normal in size without focal abnormality. Adrenals/Urinary Tract: Adrenal glands are unremarkable. Kidneys are normal, without renal calculi, focal lesion, or hydronephrosis. Bladder is unremarkable. Stomach/Bowel: Stomach appears normal. The appendix is visualized and is normal. No small bowel wall thickening,  inflammation or distension. Circumferential mass involving the rectum measures 5.0 x 3.2 cm, image 106/6. This is compared with 5.2 x 4.1 cm previously. No signs of obstruction. Vascular/Lymphatic: Aortic atherosclerosis. No aneurysm. Porta hepatic lymph node has a short axis of 1.3 cm, image 59/2. Unchanged low-density aortocaval lymph node at the level of the left renal vein measures 1 cm, image 69/2. Also unchanged. No pelvic or inguinal adenopathy. Reproductive: Prostate is unremarkable. Other: No free fluid or fluid collections. Musculoskeletal: Degenerative disc disease identified within the lumbar spine. No acute or suspicious osseous findings. IMPRESSION: 1. Mild interval decrease in size of circumferential mass involving the rectum. 2. Stable appearance of borderline enlarged porta hepatic and aortocaval lymph nodes. 3. No new or progressive disease identified. 4. Unchanged appearance of scattered, punctate lung nodules which are nonspecific and favored to represent a benign process. 5. Clot versus fibrin sheath surrounds the intravascular component of the right porta catheter at the level of the right internal jugular vein. 6. Aortic atherosclerosis and coronary artery calcifications. Aortic Atherosclerosis (ICD10-I70.0). Electronically Signed   By: Kerby Moors M.D.   On: 01/19/2020 10:37     Assessment and plan 1. Encounter for antineoplastic chemotherapy   2. Rectal cancer ([place])   3. Dehydration   Cancer Staging Rectal cancer [facility]) Staging form: Colon and Rectum, AJCC 8th Edition - Clinical stage from 09/22/2019: Stage IIIC (cT4b, cN2, cM0) - Signed by [name], MD on 09/22/2019  Stage IIIC rectal cancer cT4 N2 M0 rectal cancer Labs are reviewed and discussed with patient. Continue Xeloda 1500mg  BID.   #  Chemotherapy induced nausea and vomiting Resolved. IVF 1L NS x 1.    #Chronic kidney disease, creatinine is stable at 1.64  Anemia, hemoglobin is 11.8.  Stable. Vitamin B12  deficiency, continue B12 supplementation. 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  Continue monitor.  We spent sufficient time to discuss many aspect of care, questions were answered to patient's satisfaction. Follow up in 1 week   [name], MD, PhD Hematology Oncology [facility] Pager- [phone] 02/17/2020

## 2020-02-17 NOTE — Progress Notes (Signed)
Pt here for follow up. Denies pain or N/V. He states "I feel good today." Pt did not bring facility [facility] and he does not know what medications he is taking, Unable to do medication reconciliation.

## 2020-02-17 NOTE — Progress Notes (Signed)
Pt feeling better this week. Received IV hydration. Eating snacks and drinking while in clinic. Denies any pain. Discharged from clinic in wheelchair. Christopher Delacruz has been called through [facility] transportation.

## 2020-02-20 ENCOUNTER — Encounter (HOSPITAL_COMMUNITY): Payer: Self-pay | Admitting: Internal Medicine

## 2020-02-20 ENCOUNTER — Inpatient Hospital Stay: Payer: Medicare Other

## 2020-02-20 ENCOUNTER — Ambulatory Visit
Admission: RE | Admit: 2020-02-20 | Discharge: 2020-02-20 | Disposition: A | Discharge status: Home / Self Care | Payer: Medicare Other | Source: Ambulatory Visit | Attending: Radiation Oncology | Admitting: Radiation Oncology

## 2020-02-20 ENCOUNTER — Other Ambulatory Visit (HOSPITAL_COMMUNITY): Payer: Medicare Other

## 2020-02-20 DIAGNOSIS — C20 Malignant neoplasm of rectum: Secondary | ICD-10-CM | POA: Diagnosis not present

## 2020-02-21 ENCOUNTER — Ambulatory Visit
Admission: RE | Admit: 2020-02-21 | Discharge: 2020-02-21 | Disposition: A | Discharge status: Home / Self Care | Payer: Medicare Other | Source: Ambulatory Visit | Attending: Radiation Oncology | Admitting: Radiation Oncology

## 2020-02-21 ENCOUNTER — Inpatient Hospital Stay: Payer: Medicare Other

## 2020-02-21 DIAGNOSIS — C20 Malignant neoplasm of rectum: Secondary | ICD-10-CM | POA: Diagnosis not present

## 2020-02-22 ENCOUNTER — Inpatient Hospital Stay: Payer: Medicare Other

## 2020-02-22 ENCOUNTER — Ambulatory Visit
Admission: RE | Admit: 2020-02-22 | Discharge: 2020-02-22 | Disposition: A | Discharge status: Home / Self Care | Payer: Medicare Other | Source: Ambulatory Visit | Attending: Radiation Oncology | Admitting: Radiation Oncology

## 2020-02-22 DIAGNOSIS — C20 Malignant neoplasm of rectum: Secondary | ICD-10-CM | POA: Diagnosis not present

## 2020-02-23 ENCOUNTER — Inpatient Hospital Stay: Payer: Medicare Other

## 2020-02-23 ENCOUNTER — Ambulatory Visit
Admission: RE | Admit: 2020-02-23 | Discharge: 2020-02-23 | Disposition: A | Discharge status: Home / Self Care | Payer: Medicare Other | Source: Ambulatory Visit | Attending: Radiation Oncology | Admitting: Radiation Oncology

## 2020-02-23 DIAGNOSIS — C20 Malignant neoplasm of rectum: Secondary | ICD-10-CM | POA: Diagnosis not present

## 2020-02-24 ENCOUNTER — Encounter: Payer: Self-pay | Admitting: Oncology

## 2020-02-24 ENCOUNTER — Inpatient Hospital Stay: Payer: Medicare Other

## 2020-02-24 ENCOUNTER — Inpatient Hospital Stay (HOSPITAL_BASED_OUTPATIENT_CLINIC_OR_DEPARTMENT_OTHER): Payer: Medicare Other | Admitting: Oncology

## 2020-02-24 ENCOUNTER — Other Ambulatory Visit: Payer: Self-pay

## 2020-02-24 ENCOUNTER — Ambulatory Visit
Admission: RE | Admit: 2020-02-24 | Discharge: 2020-02-24 | Disposition: A | Discharge status: Home / Self Care | Payer: Medicare Other | Source: Ambulatory Visit | Attending: Radiation Oncology | Admitting: Radiation Oncology

## 2020-02-24 VITALS — BP 153/75 | HR 63 | Temp 97.4°F | Resp 18 | Wt 238.9 lb

## 2020-02-24 DIAGNOSIS — Z5111 Encounter for antineoplastic chemotherapy: Secondary | ICD-10-CM

## 2020-02-24 DIAGNOSIS — C20 Malignant neoplasm of rectum: Secondary | ICD-10-CM | POA: Diagnosis not present

## 2020-02-24 DIAGNOSIS — T451X5A Adverse effect of antineoplastic and immunosuppressive drugs, initial encounter: Secondary | ICD-10-CM

## 2020-02-24 DIAGNOSIS — R112 Nausea with vomiting, unspecified: Secondary | ICD-10-CM

## 2020-02-24 DIAGNOSIS — E538 Deficiency of other specified B group vitamins: Secondary | ICD-10-CM

## 2020-02-24 DIAGNOSIS — E860 Dehydration: Secondary | ICD-10-CM

## 2020-02-24 DIAGNOSIS — R4189 Other symptoms and signs involving cognitive functions and awareness: Secondary | ICD-10-CM | POA: Diagnosis not present

## 2020-02-24 LAB — COMPREHENSIVE METABOLIC PANEL
ALT: 13 U/L (ref 0–44)
AST: 19 U/L (ref 15–41)
Albumin: 3.4 g/dL — ABNORMAL LOW (ref 3.5–5.0)
Alkaline Phosphatase: 52 U/L (ref 38–126)
Anion gap: 8 (ref 5–15)
BUN: 24 mg/dL — ABNORMAL HIGH (ref 8–23)
CO2: 27 mmol/L (ref 22–32)
Calcium: 9 mg/dL (ref 8.9–10.3)
Chloride: 105 mmol/L (ref 98–111)
Creatinine, Ser: 1.74 mg/dL — ABNORMAL HIGH (ref 0.61–1.24)
GFR, Estimated: 42 mL/min — ABNORMAL LOW (ref >60)
Glucose, Bld: 111 mg/dL — ABNORMAL HIGH (ref 70–99)
Potassium: 4 mmol/L (ref 3.5–5.1)
Sodium: 140 mmol/L (ref 135–145)
Total Bilirubin: 0.8 mg/dL (ref 0.3–1.2)
Total Protein: 6.5 g/dL (ref 6.5–8.1)

## 2020-02-24 LAB — CBC WITH DIFFERENTIAL/PLATELET
Abs Immature Granulocytes: 0.03 10*3/uL (ref 0.00–0.07)
Basophils Absolute: 0 10*3/uL (ref 0.0–0.1)
Basophils Relative: 1 %
Eosinophils Absolute: 0.2 10*3/uL (ref 0.0–0.5)
Eosinophils Relative: 3 %
HCT: 32.6 % — ABNORMAL LOW (ref 39.0–52.0)
Hemoglobin: 10.8 g/dL — ABNORMAL LOW (ref 13.0–17.0)
Immature Granulocytes: 1 %
Lymphocytes Relative: 11 %
Lymphs Abs: 0.7 10*3/uL (ref 0.7–4.0)
MCH: 29.8 pg (ref 26.0–34.0)
MCHC: 33.1 g/dL (ref 30.0–36.0)
MCV: 89.8 fL (ref 80.0–100.0)
Monocytes Absolute: 0.6 10*3/uL (ref 0.1–1.0)
Monocytes Relative: 9 %
Neutro Abs: 5 10*3/uL (ref 1.7–7.7)
Neutrophils Relative %: 75 %
Platelets: 180 10*3/uL (ref 150–400)
RBC: 3.63 MIL/uL — ABNORMAL LOW (ref 4.22–5.81)
RDW: 13.9 % (ref 11.5–15.5)
WBC: 6.5 10*3/uL (ref 4.0–10.5)
nRBC: 0 % (ref 0.0–0.2)

## 2020-02-24 MED ORDER — CYANOCOBALAMIN 1000 MCG/ML IJ SOLN: 1000.0000 ug | Freq: Once | INTRAMUSCULAR | Status: DC

## 2020-02-24 MED ORDER — SODIUM CHLORIDE 0.9 % IV SOLN
Freq: Once | INTRAVENOUS | Status: AC
  Filled 2020-02-24: qty 250

## 2020-02-24 MED ORDER — SODIUM CHLORIDE 0.9% FLUSH
10.0000 mL | Freq: Once | INTRAVENOUS | Status: AC | PRN
  Administered 2020-02-24: 10 mL
  Filled 2020-02-24: qty 10

## 2020-02-24 MED ORDER — HEPARIN SOD (PORK) LOCK FLUSH 100 UNIT/ML IV SOLN
500.0000 [IU] | Freq: Once | INTRAVENOUS | Status: AC | PRN
  Administered 2020-02-24: 500 [IU]
  Filled 2020-02-24: qty 5

## 2020-02-24 NOTE — Progress Notes (Signed)
Patient received fluids in the fluid clinic today, tolerated well. Transported downstairs to radiation.

## 2020-02-24 NOTE — Progress Notes (Signed)
Patient here for oncology follow-up appointment, expresses concerns of occassional dizziness.

## 2020-02-24 NOTE — Progress Notes (Signed)
Hematology/Oncology Follow Up Note [place]  Telephone:(336) [phone] Fax:([phone]  Patient Care Team: [name], MD as PCP - General (Family Medicine) Borders, Kirt Boys, NP as Nurse Practitioner (Hospice and Palliative Medicine) [name], MD as Consulting Physician (Oncology) [name], MD as Referring Physician (Radiation Oncology) [name], NP as Nurse Practitioner [name], RN as Oncology Nurse Navigator [name], MD as Consulting Physician (Neurology) [name], MD as Consulting Physician (Gastroenterology) [name], MD as Consulting Physician (Psychiatry) [name], MD as Consulting Physician (Colon and Rectal Surgery)   Name of the patient: Christopher Delacruz  [id number]  May 29, 1950   REASON FOR VISIT  follow-up for rectal cancer  PERTINENT ONCOLOGY HISTORY Christopher Delacruz is a 70 y.o.[place] who has above oncology history reviewed by me today presented for follow up visit for management of locally advanced rectal cancer. -Initially presented to emergency room due to profound anemia. 09/11/2019 EGD showed duodenal erosions without bleeding 09/11/2019 colonoscopy preparation was poor.  Likely malignant partially obstructing tumor in the rectosigmoid colon area which was biopsied. 09/13/2019, repeat colonoscopy showed 12 mm polyps were found in the descending colon.  Polyp was sessile.  Polyp was resected and retrieved.  A polypoid ulcerated partially obstructing large mass was found from 10 to 20 cm proximal to the anus.  The mass was circumferential, measured 10 cm in length.  In addition the diameter measures 12 mm.  Oozing was present 09/11/2019, biopsy showed invasive colorectal adenocarcinoma.  Moderately differentiated. 09/13/2019, CT chest abdomen pelvis showed rectal wall mass with evidence of local invasion to the mesorectal fat and potential involvement of the right seminal vesicle.  Multiple  enlarged lymph nodes.  Small lung nodules 2 to 3 mm in the lung bilaterally.  Highly nonspecific.  Cannot rule out metastasis.  09/14/2019 MRI pelvis showed T4b N2 rectal cancer. 09/15/2019 MRI brain with and without contrast showed punctate focus of restricted diffusion most likely reflects acute or early subacute small vessel infarct.  No evidence of intracranial metastasis.  Multiple chronic infarct.  #Iron deficiency anemia, patient received PRBC transfusion and IV Venofer treatments. #Vitamin B12 deficiency, status post parenteral vitamin B12 injections during hospitalization.  Currently on vitamin B12 supplementation. #Patient was found to have very poor insight to his condition.  Was seen by psychiatrist and was deemed incompetent.  Patient's medical power of attorney is his nephew Christopher Delacruz.  #Mediport was placed by Dr. Dahlia Byes  # #H. pylori gastritis, patient finishes course of antibiotics and PPI # 09/26/2019- 01/10/20  FOLFOX x 8  01/19/2020, CT chest abdomen pelvis with contrast showed mild interval decrease in size of the circumferential mass involving the rectum.  Stable appearance borderline enlarged porta hepatic and aorto caval lymph nodes.  No new or progressive disease.Scattered punctated lung nodules, unchanged.  Clot versus fibrin sheath surrounding the intravascular component of the right portacatheter.  No report of Mediport malfunctioning and he is asymptomatic.  Monitor.  INTERVA L HISTORY 70 y.o. patient presents for follow-up of rectal cancer.Marland Kitchen  He reports feeling well. No nausea, vomiting.   Review of Systems  Unable to perform ROS: Other (Cognitive impairment)  Constitutional: Negative for fatigue.  Respiratory: Negative for shortness of breath.   Gastrointestinal: Negative for abdominal pain, blood in stool and constipation.      No Known Allergies   Past Medical History:  Diagnosis Date  . Acute renal failure superimposed on stage 3a chronic kidney disease ([place])  09/07/2019  . B12  deficiency 09/17/2019  . Cancer ([place])   . CVA (cerebral vascular accident) ([place])   . Diabetes mellitus ([place])   . Hyperlipidemia   . Hypertension   . Right hemiparesis ([place]) 2017   Associated with stroke     Past Surgical History:  Procedure Laterality Date  . COLONOSCOPY N/A 09/11/2019   Procedure: COLONOSCOPY;  Surgeon: [name], MD;  Location: [facility] ENDOSCOPY;  Service: Endoscopy;  Laterality: N/A;  . COLONOSCOPY WITH PROPOFOL N/A 09/12/2019   Procedure: COLONOSCOPY WITH PROPOFOL;  Surgeon: [name], MD;  Location: [facility] ENDOSCOPY;  Service: Gastroenterology;  Laterality: N/A;  . COLONOSCOPY WITH PROPOFOL N/A 09/13/2019   Procedure: COLONOSCOPY WITH PROPOFOL;  Surgeon: [name], MD;  Location: [facility] ENDOSCOPY;  Service: Gastroenterology;  Laterality: N/A;  . ESOPHAGOGASTRODUODENOSCOPY N/A 09/11/2019   Procedure: ESOPHAGOGASTRODUODENOSCOPY (EGD);  Surgeon: [name], MD;  Location: [facility] ENDOSCOPY;  Service: Endoscopy;  Laterality: N/A;  . EXPLORE EYE SOCKET    . PORTACATH PLACEMENT N/A 09/15/2019   Procedure: INSERTION PORT-A-CATH;  Surgeon: Jules Husbands, MD;  Location: [facility] ORS;  Service: General;  Laterality: N/A;    Social History   Socioeconomic History  . Marital status: Divorced    Spouse name: Not on file  . Number of children: Not on file  . Years of education: Not on file  . Highest education level: Not on file  Occupational History  . 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 Smoking status: Never Smoker  . Smokeless tobacco: Never Used  Vaping Use  . Vaping Use: Never used  Substance and Sexual Activity  . Alcohol use: Never    Alcohol/week: 0.0 standard drinks  . Drug use: Never  . Sexual activity: Not on file  Other Topics Concern  . Not on file  Social History Narrative   Nephew with [place] due to cognitive impairment of patient after  stroke 2017      Lives in [place] in [place], [place]   Social Determinants of Health   Financial Resource Strain: Not on file  Food Insecurity: Not on file  Transportation Needs: Unmet Transportation Needs  . Lack of Transportation (Medical): Yes  . Lack of Transportation (Non-Medical): Yes  Physical Activity: Not on file  Stress: Not on file  Social Connections: Not on file  Intimate Partner Violence: Not on file    Family History  Problem Relation Age of Onset  . Heart attack Sister   . Leukemia Brother   . Stroke Brother      Current Outpatient Medications:  .  amLODipine (NORVASC) 10 MG tablet, Take 1 tablet (10 mg total) by mouth daily., Disp: 30 tablet, Rfl: 1 .  aspirin EC 81 MG EC tablet, Take 1 tablet (81 mg total) by mouth daily. Swallow whole., Disp: 30 tablet, Rfl: 11 .  capecitabine (XELODA) 500 MG tablet, Take 3 tablets (1,500 mg total) by mouth 2 (two) times daily after a meal. Take Monday through Friday during radiation., Disp: 150 tablet, Rfl: 0 .  clopidogrel (PLAVIX) 75 MG tablet, Take 1 tablet (75 mg total) by mouth daily., Disp: 30 tablet, Rfl: 1 .  ferrous sulfate 325 (65 FE) MG tablet, Take 1 tablet (325 mg total) by mouth daily., Disp: 30 tablet, Rfl: 3 .  finasteride (PROSCAR) 5 MG tablet, Take 5 mg by mouth daily., Disp: , Rfl:  .  multivitamin (  ONE-A-DAY MEN'S) TABS tablet, Take 1 tablet by mouth daily., Disp: , Rfl:  .  nystatin (MYCOSTATIN) 100000 UNIT/ML suspension, Take 5 mLs by mouth 4 (four) times daily. Swish and spit  Reported by [place] (Patient not taking: Reported on 02/24/2020), Disp: , Rfl:  .  ondansetron (ZOFRAN) 8 MG tablet, Take 1 tablet (8 mg total) by mouth 2 (two) times daily as needed for refractory nausea / vomiting. Start on day 3 after chemotherapy., Disp: 30 tablet, Rfl: 1 .  prochlorperazine (COMPAZINE) 10 MG tablet, Take 1 tablet (10 mg total) by mouth every 6 (six) hours as needed (Nausea or vomiting).,  Disp: 30 tablet, Rfl: 1 .  vitamin B-12 (CYANOCOBALAMIN) 1000 MCG tablet, Take 1,000 mcg by mouth daily., Disp: , Rfl:   Physical exam:  Vitals:   02/24/20 0928  BP: (!) 153/75  Pulse: 63  Resp: 18  Temp: (!) 97.4 F (36.3 C)  TempSrc: Tympanic  SpO2: 97%  Weight: 238 lb 14.4 oz (108.4 kg)   Physical Exam Constitutional:      General: He is not in acute distress. HENT:     Head: Normocephalic and atraumatic.  Eyes:     General: No scleral icterus. Cardiovascular:     Rate and Rhythm: Normal rate and regular rhythm.     Heart sounds: Normal heart sounds.  Pulmonary:     Effort: Pulmonary effort is normal. No respiratory distress.     Breath sounds: No wheezing.  Abdominal:     General: Bowel sounds are normal. There is no distension.     Palpations: Abdomen is soft.  Musculoskeletal:        General: No deformity. Normal range of motion.     Cervical back: Normal range of motion and neck supple.  Skin:    General: Skin is warm and dry.     Findings: No erythema or rash.  Neurological:     Mental Status: He is alert. Mental status is at baseline.     Cranial Nerves: No cranial nerve deficit.     Coordination: Coordination normal.     Comments: Orientated x2  Psychiatric:        Mood and Affect: Mood normal.     CMP Latest Ref Rng & Units 02/24/2020  Glucose 70 - 99 mg/dL 111(H)  BUN 8 - 23 mg/dL 24(H)  Creatinine 0.61 - 1.24 mg/dL 1.74(H)  Sodium 135 - 145 mmol/L 140  Potassium 3.5 - 5.1 mmol/L 4.0  Chloride 98 - 111 mmol/L 105  CO2 22 - 32 mmol/L 27  Calcium 8.9 - 10.3 mg/dL 9.0  Total Protein 6.5 - 8.1 g/dL 6.5  Total Bilirubin 0.3 - 1.2 mg/dL 0.8  Alkaline Phos 38 - 126 U/L 52  AST 15 - 41 U/L 19  ALT 0 - 44 U/L 13   CBC Latest Ref Rng & Units 02/24/2020  WBC 4.0 - 10.5 K/uL 6.5  Hemoglobin 13.0 - 17.0 g/dL 10.8(L)  Hematocrit 39.0 - 52.0 % 32.6(L)  Platelets 150 - 400 K/uL 180    RADIOGRAPHIC STUDIES: I have personally reviewed the radiological  images as listed and agreed with the findings in the report. No results found.   Assessment and plan 1. Rectal cancer ([place])   2. Encounter for antineoplastic chemotherapy   3. Cognitive impairment   4. Chemotherapy induced nausea and vomiting   Cancer Staging Rectal cancer [facility]) Staging form: Colon and Rectum, AJCC 8th Edition - Clinical stage from 09/22/2019: Stage IIIC (cT4b,  cN2, cM0) - Signed by [name], MD on 09/22/2019  Stage IIIC rectal cancer cT4 N2 M0 rectal cancer Labs are reviewed and discussed with patient. Continue Xeloda 1500mg  BID.   #  Chemotherapy induced nausea and vomiting Resolved. IV NS 1L over one hour.   #Chronic kidney disease, creatinine is slightly increased at 1.74.   Anemia, hemoglobin trends down, 10.8. 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  Continue monitor.  We spent sufficient time to discuss many aspect of care, questions were answered to patient's satisfaction. Follow up in 1 week   [name], MD, PhD Hematology Oncology [facility] Pager- [phone] 02/24/2020

## 2020-02-27 ENCOUNTER — Ambulatory Visit
Admission: RE | Admit: 2020-02-27 | Discharge: 2020-02-27 | Disposition: A | Discharge status: Home / Self Care | Payer: Medicare Other | Source: Ambulatory Visit | Attending: Radiation Oncology | Admitting: Radiation Oncology

## 2020-02-27 ENCOUNTER — Other Ambulatory Visit: Payer: Self-pay

## 2020-02-27 ENCOUNTER — Inpatient Hospital Stay: Payer: Medicare Other

## 2020-02-27 DIAGNOSIS — C20 Malignant neoplasm of rectum: Secondary | ICD-10-CM | POA: Diagnosis not present

## 2020-02-28 ENCOUNTER — Inpatient Hospital Stay: Payer: Medicare Other | Attending: Oncology

## 2020-02-28 ENCOUNTER — Ambulatory Visit
Admission: RE | Admit: 2020-02-28 | Discharge: 2020-02-28 | Disposition: A | Discharge status: Home / Self Care | Payer: Medicare Other | Source: Ambulatory Visit | Attending: Radiation Oncology | Admitting: Radiation Oncology

## 2020-02-28 DIAGNOSIS — I639 Cerebral infarction, unspecified: Secondary | ICD-10-CM | POA: Insufficient documentation

## 2020-02-28 DIAGNOSIS — Z806 Family history of leukemia: Secondary | ICD-10-CM | POA: Insufficient documentation

## 2020-02-28 DIAGNOSIS — R4189 Other symptoms and signs involving cognitive functions and awareness: Secondary | ICD-10-CM | POA: Insufficient documentation

## 2020-02-28 DIAGNOSIS — E611 Iron deficiency: Secondary | ICD-10-CM | POA: Insufficient documentation

## 2020-02-28 DIAGNOSIS — C20 Malignant neoplasm of rectum: Secondary | ICD-10-CM | POA: Insufficient documentation

## 2020-02-28 DIAGNOSIS — N1832 Chronic kidney disease, stage 3b: Secondary | ICD-10-CM | POA: Insufficient documentation

## 2020-02-28 DIAGNOSIS — Z823 Family history of stroke: Secondary | ICD-10-CM | POA: Insufficient documentation

## 2020-02-28 DIAGNOSIS — N189 Chronic kidney disease, unspecified: Secondary | ICD-10-CM | POA: Insufficient documentation

## 2020-02-28 DIAGNOSIS — E538 Deficiency of other specified B group vitamins: Secondary | ICD-10-CM | POA: Insufficient documentation

## 2020-02-28 DIAGNOSIS — Z8249 Family history of ischemic heart disease and other diseases of the circulatory system: Secondary | ICD-10-CM | POA: Insufficient documentation

## 2020-02-28 DIAGNOSIS — K521 Toxic gastroenteritis and colitis: Secondary | ICD-10-CM | POA: Insufficient documentation

## 2020-02-28 DIAGNOSIS — F0150 Vascular dementia without behavioral disturbance: Secondary | ICD-10-CM | POA: Insufficient documentation

## 2020-02-28 DIAGNOSIS — Z79899 Other long term (current) drug therapy: Secondary | ICD-10-CM | POA: Insufficient documentation

## 2020-02-28 DIAGNOSIS — R911 Solitary pulmonary nodule: Secondary | ICD-10-CM | POA: Insufficient documentation

## 2020-02-28 DIAGNOSIS — R599 Enlarged lymph nodes, unspecified: Secondary | ICD-10-CM | POA: Insufficient documentation

## 2020-02-28 DIAGNOSIS — D6481 Anemia due to antineoplastic chemotherapy: Secondary | ICD-10-CM | POA: Insufficient documentation

## 2020-02-28 DIAGNOSIS — Z8673 Personal history of transient ischemic attack (TIA), and cerebral infarction without residual deficits: Secondary | ICD-10-CM | POA: Insufficient documentation

## 2020-02-28 DIAGNOSIS — T451X5A Adverse effect of antineoplastic and immunosuppressive drugs, initial encounter: Secondary | ICD-10-CM | POA: Insufficient documentation

## 2020-02-29 ENCOUNTER — Inpatient Hospital Stay: Payer: Medicare Other

## 2020-02-29 ENCOUNTER — Ambulatory Visit
Admission: RE | Admit: 2020-02-29 | Discharge: 2020-02-29 | Disposition: A | Discharge status: Home / Self Care | Payer: Medicare Other | Source: Ambulatory Visit | Attending: Radiation Oncology | Admitting: Radiation Oncology

## 2020-02-29 ENCOUNTER — Other Ambulatory Visit: Payer: Self-pay

## 2020-02-29 DIAGNOSIS — C20 Malignant neoplasm of rectum: Secondary | ICD-10-CM | POA: Diagnosis not present

## 2020-03-01 ENCOUNTER — Ambulatory Visit
Admission: RE | Admit: 2020-03-01 | Discharge: 2020-03-01 | Disposition: A | Discharge status: Home / Self Care | Payer: Medicare Other | Source: Ambulatory Visit | Attending: Radiation Oncology | Admitting: Radiation Oncology

## 2020-03-01 ENCOUNTER — Inpatient Hospital Stay: Payer: Medicare Other

## 2020-03-01 DIAGNOSIS — C20 Malignant neoplasm of rectum: Secondary | ICD-10-CM | POA: Diagnosis not present

## 2020-03-02 ENCOUNTER — Encounter: Payer: Self-pay | Admitting: Oncology

## 2020-03-02 ENCOUNTER — Inpatient Hospital Stay: Payer: Medicare Other

## 2020-03-02 ENCOUNTER — Other Ambulatory Visit: Payer: Self-pay

## 2020-03-02 ENCOUNTER — Ambulatory Visit
Admission: RE | Admit: 2020-03-02 | Discharge: 2020-03-02 | Disposition: A | Discharge status: Home / Self Care | Payer: Medicare Other | Source: Ambulatory Visit | Attending: Radiation Oncology | Admitting: Radiation Oncology

## 2020-03-02 ENCOUNTER — Inpatient Hospital Stay (HOSPITAL_BASED_OUTPATIENT_CLINIC_OR_DEPARTMENT_OTHER): Payer: Medicare Other | Admitting: Oncology

## 2020-03-02 VITALS — BP 157/77 | HR 59 | Temp 98.5°F | Wt 239.7 lb

## 2020-03-02 DIAGNOSIS — Z5111 Encounter for antineoplastic chemotherapy: Secondary | ICD-10-CM | POA: Diagnosis not present

## 2020-03-02 DIAGNOSIS — R112 Nausea with vomiting, unspecified: Secondary | ICD-10-CM

## 2020-03-02 DIAGNOSIS — C20 Malignant neoplasm of rectum: Secondary | ICD-10-CM

## 2020-03-02 DIAGNOSIS — R4189 Other symptoms and signs involving cognitive functions and awareness: Secondary | ICD-10-CM | POA: Diagnosis not present

## 2020-03-02 DIAGNOSIS — E860 Dehydration: Secondary | ICD-10-CM

## 2020-03-02 DIAGNOSIS — T451X5A Adverse effect of antineoplastic and immunosuppressive drugs, initial encounter: Secondary | ICD-10-CM

## 2020-03-02 LAB — CBC WITH DIFFERENTIAL/PLATELET
Abs Immature Granulocytes: 0.01 K/uL (ref 0.00–0.07)
Basophils Absolute: 0 K/uL (ref 0.0–0.1)
Basophils Relative: 0 %
Eosinophils Absolute: 0.2 K/uL (ref 0.0–0.5)
Eosinophils Relative: 5 %
HCT: 31.6 % — ABNORMAL LOW (ref 39.0–52.0)
Hemoglobin: 10.5 g/dL — ABNORMAL LOW (ref 13.0–17.0)
Immature Granulocytes: 0 %
Lymphocytes Relative: 11 %
Lymphs Abs: 0.5 K/uL — ABNORMAL LOW (ref 0.7–4.0)
MCH: 30.1 pg (ref 26.0–34.0)
MCHC: 33.2 g/dL (ref 30.0–36.0)
MCV: 90.5 fL (ref 80.0–100.0)
Monocytes Absolute: 0.6 K/uL (ref 0.1–1.0)
Monocytes Relative: 14 %
Neutro Abs: 3.3 K/uL (ref 1.7–7.7)
Neutrophils Relative %: 70 %
Platelets: 203 K/uL (ref 150–400)
RBC: 3.49 MIL/uL — ABNORMAL LOW (ref 4.22–5.81)
RDW: 14.4 % (ref 11.5–15.5)
WBC: 4.7 K/uL (ref 4.0–10.5)
nRBC: 0 % (ref 0.0–0.2)

## 2020-03-02 LAB — COMPREHENSIVE METABOLIC PANEL
ALT: 12 U/L (ref 0–44)
AST: 17 U/L (ref 15–41)
Albumin: 3.3 g/dL — ABNORMAL LOW (ref 3.5–5.0)
Alkaline Phosphatase: 57 U/L (ref 38–126)
Anion gap: 10 (ref 5–15)
BUN: 18 mg/dL (ref 8–23)
CO2: 25 mmol/L (ref 22–32)
Calcium: 8.9 mg/dL (ref 8.9–10.3)
Chloride: 108 mmol/L (ref 98–111)
Creatinine, Ser: 1.78 mg/dL — ABNORMAL HIGH (ref 0.61–1.24)
GFR, Estimated: 41 mL/min — ABNORMAL LOW (ref >60)
Glucose, Bld: 109 mg/dL — ABNORMAL HIGH (ref 70–99)
Potassium: 3.8 mmol/L (ref 3.5–5.1)
Sodium: 143 mmol/L (ref 135–145)
Total Bilirubin: 0.8 mg/dL (ref 0.3–1.2)
Total Protein: 6.6 g/dL (ref 6.5–8.1)

## 2020-03-02 MED ORDER — HEPARIN SOD (PORK) LOCK FLUSH 100 UNIT/ML IV SOLN
500.0000 [IU] | Freq: Once | INTRAVENOUS | Status: AC
  Administered 2020-03-02: 500 [IU] via INTRAVENOUS
  Filled 2020-03-02: qty 5

## 2020-03-02 MED ORDER — SODIUM CHLORIDE 0.9 % IV SOLN
Freq: Once | INTRAVENOUS | Status: AC
  Filled 2020-03-02: qty 250

## 2020-03-02 MED ORDER — SODIUM CHLORIDE 0.9% FLUSH
10.0000 mL | Freq: Once | INTRAVENOUS | Status: AC
  Administered 2020-03-02: 10 mL via INTRAVENOUS
  Filled 2020-03-02: qty 10

## 2020-03-02 NOTE — Progress Notes (Signed)
Hematology/Oncology Follow Up Note [place]  Telephone:(336) [phone] Fax:(336) [phone]  Patient Care Team: [name], MD as PCP - General (Family Medicine) Borders, Kirt Boys, NP as Nurse Practitioner (Hospice and Palliative Medicine) [name], MD as Consulting Physician (Oncology) [name], MD as Referring Physician (Radiation Oncology) [name], NP as Nurse Practitioner [name], RN as Oncology Nurse Navigator [name], MD as Consulting Physician (Neurology) [name], MD as Consulting Physician (Gastroenterology) [name], MD as Consulting Physician (Psychiatry) [name], MD as Consulting Physician (Colon and Rectal Surgery)   Name of the patient: Christopher Delacruz  [id number]  08-30-1950   REASON FOR VISIT  follow-up for rectal cancer  PERTINENT ONCOLOGY HISTORY Christopher Delacruz is a 70 y.o.[place] who has above oncology history reviewed by me today presented for follow up visit for management of locally advanced rectal cancer. -Initially presented to emergency room due to profound anemia. 09/11/2019 EGD showed duodenal erosions without bleeding 09/11/2019 colonoscopy preparation was poor.  Likely malignant partially obstructing tumor in the rectosigmoid colon area which was biopsied. 09/13/2019, repeat colonoscopy showed 12 mm polyps were found in the descending colon.  Polyp was sessile.  Polyp was resected and retrieved.  A polypoid ulcerated partially obstructing large mass was found from 10 to 20 cm proximal to the anus.  The mass was circumferential, measured 10 cm in length.  In addition the diameter measures 12 mm.  Oozing was present 09/11/2019, biopsy showed invasive colorectal adenocarcinoma.  Moderately differentiated. 09/13/2019, CT chest abdomen pelvis showed rectal wall mass with evidence of local invasion to the mesorectal fat and potential involvement of the right seminal vesicle.  Multiple  enlarged lymph nodes.  Small lung nodules 2 to 3 mm in the lung bilaterally.  Highly nonspecific.  Cannot rule out metastasis.  09/14/2019 MRI pelvis showed T4b N2 rectal cancer. 09/15/2019 MRI brain with and without contrast showed punctate focus of restricted diffusion most likely reflects acute or early subacute small vessel infarct.  No evidence of intracranial metastasis.  Multiple chronic infarct.  #Iron deficiency anemia, patient received PRBC transfusion and IV Venofer treatments. #Vitamin B12 deficiency, status post parenteral vitamin B12 injections during hospitalization.  Currently on vitamin B12 supplementation. #Patient was found to have very poor insight to his condition.  Was seen by psychiatrist and was deemed incompetent.  Patient's medical power of attorney is his nephew [name].  #Mediport was placed by Dr. Dahlia Byes  # #H. pylori gastritis, patient finishes course of antibiotics and PPI # 09/26/2019- 01/10/20  FOLFOX x 8  01/19/2020, CT chest abdomen pelvis with contrast showed mild interval decrease in size of the circumferential mass involving the rectum.  Stable appearance borderline enlarged porta hepatic and aorto caval lymph nodes.  No new or progressive disease.Scattered punctated lung nodules, unchanged.  Clot versus fibrin sheath surrounding the intravascular component of the right portacatheter.  No report of Mediport malfunctioning and he is asymptomatic.  Monitor.  INTERVA L HISTORY 70 y.o. patient presents for follow-up of rectal cancer.Marland Kitchen  He reports feeling well.  No nausea vomiting.  Occasional diarrhea.  Patient is a poor historian due to dementia. Patient is on Xarelto as listed on his medication list from the facility.  Review of Systems  Unable to perform ROS: Other (Cognitive impairment)  Constitutional: Negative for fatigue.  Respiratory: Negative for shortness of breath.   Gastrointestinal: Negative for abdominal pain, blood in stool and constipation.       No Known Allergies  Past Medical History:  Diagnosis Date  . Acute renal failure superimposed on stage 3a chronic kidney disease ([place]) 09/07/2019  . B12 deficiency 09/17/2019  . Cancer ([place])   . CVA (cerebral vascular accident) ([place])   . Diabetes mellitus ([place])   . Hyperlipidemia   . Hypertension   . Right hemiparesis ([place]) 2017   Associated with stroke     Past Surgical History:  Procedure Laterality Date  . COLONOSCOPY N/A 09/11/2019   Procedure: COLONOSCOPY;  Surgeon: [name], MD;  Location: [facility] ENDOSCOPY;  Service: Endoscopy;  Laterality: N/A;  . COLONOSCOPY WITH PROPOFOL N/A 09/12/2019   Procedure: COLONOSCOPY WITH PROPOFOL;  Surgeon: [name], MD;  Location: [facility] ENDOSCOPY;  Service: Gastroenterology;  Laterality: N/A;  . COLONOSCOPY WITH PROPOFOL N/A 09/13/2019   Procedure: COLONOSCOPY WITH PROPOFOL;  Surgeon: [name], MD;  Location: [facility] ENDOSCOPY;  Service: Gastroenterology;  Laterality: N/A;  . ESOPHAGOGASTRODUODENOSCOPY N/A 09/11/2019   Procedure: ESOPHAGOGASTRODUODENOSCOPY (EGD);  Surgeon: [name], MD;  Location: [facility] ENDOSCOPY;  Service: Endoscopy;  Laterality: N/A;  . EXPLORE EYE SOCKET    . PORTACATH PLACEMENT N/A 09/15/2019   Procedure: INSERTION PORT-A-CATH;  Surgeon: Jules Husbands, MD;  Location: [facility] ORS;  Service: General;  Laterality: N/A;    Social History   Socioeconomic History  . Marital status: Divorced    Spouse name: Not on file  . Number of children: Not on file  . Years of education: Not on file  . Highest education level: Not on file  Occupational History  . 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 Smoking status: Never Smoker  . Smokeless tobacco: Never Used  Vaping Use  . Vaping Use: Never used  Substance and Sexual Activity  . Alcohol use: Never    Alcohol/week: 0.0 standard drinks  . Drug use: Never  . Sexual activity: Not on file  Other  Topics Concern  . Not on file  Social History Narrative   Nephew with [place] due to cognitive impairment of patient after stroke 2017      Lives in [place] in [place], [place]   Social Determinants of Health   Financial Resource Strain: Not on file  Food Insecurity: Not on file  Transportation Needs: Unmet Transportation Needs  . Lack of Transportation (Medical): Yes  . Lack of Transportation (Non-Medical): Yes  Physical Activity: Not on file  Stress: Not on file  Social Connections: Not on file  Intimate Partner Violence: Not on file    Family History  Problem Relation Age of Onset  . Heart attack Sister   . Leukemia Brother   . Stroke Brother      Current Outpatient Medications:  .  amLODipine (NORVASC) 10 MG tablet, Take 1 tablet (10 mg total) by mouth daily., Disp: 30 tablet, Rfl: 1 .  aspirin EC 81 MG EC tablet, Take 1 tablet (81 mg total) by mouth daily. Swallow whole., Disp: 30 tablet, Rfl: 11 .  capecitabine (XELODA) 500 MG tablet, Take 3 tablets (1,500 mg total) by mouth 2 (two) times daily after a meal. Take Monday through Friday during radiation., Disp: 150 tablet, Rfl: 0 .  clopidogrel (PLAVIX) 75 MG tablet, Take 1 tablet (75 mg total) by mouth daily., Disp: 30 tablet, Rfl: 1 .  ferrous sulfate 325 (65 FE) MG tablet, Take 1 tablet (325 mg total) by mouth daily., Disp: 30  tablet, Rfl: 3 .  finasteride (PROSCAR) 5 MG tablet, Take 5 mg by mouth daily., Disp: , Rfl:  .  multivitamin (ONE-A-DAY MEN'S) TABS tablet, Take 1 tablet by mouth daily., Disp: , Rfl:  .  nystatin (MYCOSTATIN) 100000 UNIT/ML suspension, Take 5 mLs by mouth 4 (four) times daily. Swish and spit  Reported by [place] (Patient not taking: Reported on 02/24/2020), Disp: , Rfl:  .  ondansetron (ZOFRAN) 8 MG tablet, Take 1 tablet (8 mg total) by mouth 2 (two) times daily as needed for refractory nausea / vomiting. Start on day 3 after chemotherapy., Disp: 30  tablet, Rfl: 1 .  prochlorperazine (COMPAZINE) 10 MG tablet, Take 1 tablet (10 mg total) by mouth every 6 (six) hours as needed (Nausea or vomiting)., Disp: 30 tablet, Rfl: 1 .  vitamin B-12 (CYANOCOBALAMIN) 1000 MCG tablet, Take 1,000 mcg by mouth daily., Disp: , Rfl:   Physical exam:  Vitals:   03/02/20 0851  BP: (!) 157/77  Pulse: (!) 59  Temp: 98.5 F (36.9 C)  TempSrc: Tympanic  SpO2: 95%  Weight: 239 lb 11.2 oz (108.7 kg)   Physical Exam Constitutional:      General: He is not in acute distress. HENT:     Head: Normocephalic and atraumatic.  Eyes:     General: No scleral icterus. Cardiovascular:     Rate and Rhythm: Normal rate and regular rhythm.     Heart sounds: Normal heart sounds.  Pulmonary:     Effort: Pulmonary effort is normal. No respiratory distress.     Breath sounds: No wheezing.  Abdominal:     General: Bowel sounds are normal. There is no distension.     Palpations: Abdomen is soft.  Musculoskeletal:        General: No deformity. Normal range of motion.     Cervical back: Normal range of motion and neck supple.  Skin:    General: Skin is warm and dry.     Findings: No erythema or rash.  Neurological:     Mental Status: He is alert. Mental status is at baseline.     Cranial Nerves: No cranial nerve deficit.     Coordination: Coordination normal.     Comments: Orientated x2  Psychiatric:        Mood and Affect: Mood normal.     CMP Latest Ref Rng & Units 02/24/2020  Glucose 70 - 99 mg/dL 111(H)  BUN 8 - 23 mg/dL 24(H)  Creatinine 0.61 - 1.24 mg/dL 1.74(H)  Sodium 135 - 145 mmol/L 140  Potassium 3.5 - 5.1 mmol/L 4.0  Chloride 98 - 111 mmol/L 105  CO2 22 - 32 mmol/L 27  Calcium 8.9 - 10.3 mg/dL 9.0  Total Protein 6.5 - 8.1 g/dL 6.5  Total Bilirubin 0.3 - 1.2 mg/dL 0.8  Alkaline Phos 38 - 126 U/L 52  AST 15 - 41 U/L 19  ALT 0 - 44 U/L 13   CBC Latest Ref Rng & Units 02/24/2020  WBC 4.0 - 10.5 K/uL 6.5  Hemoglobin 13.0 - 17.0 g/dL 10.8(L)   Hematocrit 39.0 - 52.0 % 32.6(L)  Platelets 150 - 400 K/uL 180    RADIOGRAPHIC STUDIES: I have personally reviewed the radiological images as listed and agreed with the findings in the report. No results found.   Assessment and plan 1. Rectal cancer ([place])   2. Encounter for antineoplastic chemotherapy   3. Cognitive impairment   4. Chemotherapy induced nausea and vomiting   Cancer  Staging Rectal cancer [facility]) Staging form: Colon and Rectum, AJCC 8th Edition - Clinical stage from 09/22/2019: Stage IIIC (cT4b, cN2, cM0) - Signed by [name], MD on 09/22/2019  Stage IIIC rectal cancer cT4 N2 M0 rectal cancer Labs reviewed. Clinically he tolerates well Continue Xeloda 1500 mg twice daily on radiation days.  #  Chemotherapy induced nausea and vomiting, resolved. Patient will get his weekly IV normal saline hydration today.  #Chronic kidney disease, creatinine is stable.  Anemia, hemoglobin trends down, 10.5.  This is due to chemotherapy and radiation.  Continue monitor. Called his POA [name] and updated him above. 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  Continue monitor.  We spent sufficient time to discuss many aspect of care, questions were answered to patient's satisfaction. Follow up in 1 week   [name], MD, PhD Hematology Oncology [facility] at [facility] Pager- [phone] 03/02/2020

## 2020-03-02 NOTE — Progress Notes (Signed)
Pt eating snacks and drinking juice this am. Denies any nausea or pain. Pt will receive IV hydration. Discharged. No complaints offered. To lobby via W/C for [place] ride home.

## 2020-03-02 NOTE — Progress Notes (Signed)
Medications reviewed with list provided by facility.  Patient reports diarrhea with average of 2 episodes per week.

## 2020-03-05 ENCOUNTER — Inpatient Hospital Stay: Payer: Medicare Other

## 2020-03-05 ENCOUNTER — Telehealth (HOSPITAL_COMMUNITY): Payer: Self-pay | Admitting: Internal Medicine

## 2020-03-05 ENCOUNTER — Ambulatory Visit
Admission: RE | Admit: 2020-03-05 | Discharge: 2020-03-05 | Disposition: A | Discharge status: Home / Self Care | Payer: Medicare Other | Source: Ambulatory Visit | Attending: Radiation Oncology | Admitting: Radiation Oncology

## 2020-03-05 DIAGNOSIS — C20 Malignant neoplasm of rectum: Secondary | ICD-10-CM | POA: Diagnosis not present

## 2020-03-05 NOTE — Telephone Encounter (Signed)
Just an FYI. We have made several attempts to contact this patient including sending a letter to schedule or reschedule their echocardiogram. We will be removing the patient from the echo [place].    02/20/20 NO SHOWED- MAILED LETTER [facility]    Thank you

## 2020-03-06 ENCOUNTER — Ambulatory Visit
Admission: RE | Admit: 2020-03-06 | Discharge: 2020-03-06 | Disposition: A | Discharge status: Home / Self Care | Payer: Medicare Other | Source: Ambulatory Visit | Attending: Radiation Oncology | Admitting: Radiation Oncology

## 2020-03-06 ENCOUNTER — Inpatient Hospital Stay: Payer: Medicare Other

## 2020-03-06 ENCOUNTER — Ambulatory Visit: Payer: Medicare Other

## 2020-03-06 DIAGNOSIS — C20 Malignant neoplasm of rectum: Secondary | ICD-10-CM | POA: Diagnosis not present

## 2020-03-07 ENCOUNTER — Ambulatory Visit: Payer: Medicare Other

## 2020-03-07 ENCOUNTER — Telehealth: Payer: Self-pay | Admitting: Pharmacy Technician

## 2020-03-07 ENCOUNTER — Other Ambulatory Visit: Payer: Self-pay

## 2020-03-07 ENCOUNTER — Ambulatory Visit
Admission: RE | Admit: 2020-03-07 | Discharge: 2020-03-07 | Disposition: A | Discharge status: Home / Self Care | Payer: Medicare Other | Source: Ambulatory Visit | Attending: Radiation Oncology | Admitting: Radiation Oncology

## 2020-03-07 ENCOUNTER — Non-Acute Institutional Stay: Payer: Medicare Other | Admitting: Adult Health Nurse Practitioner

## 2020-03-07 ENCOUNTER — Inpatient Hospital Stay: Payer: Medicare Other

## 2020-03-07 DIAGNOSIS — C20 Malignant neoplasm of rectum: Secondary | ICD-10-CM | POA: Diagnosis not present

## 2020-03-07 DIAGNOSIS — Z515 Encounter for palliative care: Secondary | ICD-10-CM

## 2020-03-07 DIAGNOSIS — C19 Malignant neoplasm of rectosigmoid junction: Secondary | ICD-10-CM

## 2020-03-07 NOTE — Progress Notes (Signed)
[facility] Consult Note Telephone: [phone]  Fax: [phone]  PATIENT NAME: Christopher Delacruz DOB: 1950-08-21 MRN: [id number]  PRIMARY CARE PROVIDER:   [name], MD  REFERRING PROVIDER:  [name], MD [address] [place],  [place] [address]  RESPONSIBLE PARTY:   Richerd Grime, nephew/HCPOA [phone]  Chief complaint: follow up palliative visit/discuss cancer treatment  RECOMMENDATIONS and PLAN:  1.  Advanced care planning.  Patient is full code.  Called nephew to update on visit.  Left VM with reason for call and contact info  2.  Colorectal cancer.  Today had discussion with patient per nephew's request.  Patient has been undergoing chemo and radiation and is due to finish radiation about the middle of this month.  Has been discussion about surgery for colostomy formation.  Patient has stated that he is not wanting to have the surgery and does not want to have a colostomy bag.  Discussed this with him today.  He is stating that he does not want to have the surgery.  We discussed that without the surgery this will decrease his life expectancy.  Patient expresses understanding.  Though with his dementia his understanding may be limited.  He does state that though he does not want it he probably will end up having the procedure done as he knows it will extend his life.  Did talk briefly about the changes he would have with the colostomy bag and that with this provider and other providers he would get help with the transition.  This will be ongoing discussion and support will be provided for patient as he continues on this journey.  Palliative will continue to monitor for symptom management/decline and make recommendations as needed.  Will follow up in 6-8 weeks.  I spent 25 minutes providing this consultation, including time spent with patient/family, provider coordination, chart review, documentation. More than 50% of the time in this  consultation was spent coordinating communication.   HISTORY OF PRESENT ILLNESS:  Christopher Delacruz is a 70 y.o. year old male with multiple medical problems including colorectal cancer, CKD stage 3, anemia,vascular dementia, h/o CVA with right side weakness. Palliative Care was asked to help address goals of care.   CODE STATUS: full code  PPS: 50% HOSPICE ELIGIBILITY/DIAGNOSIS: TBD  PHYSICAL EXAM:   General: patient sitting in chair in NAD Eyes: sclera anicteric and noninjected with no discharge noted Pulmonary: normal respiratory effort Extremities: no edema, no joint deformities Skin: no rasheson exposed skin Neurological: Weakness; A&O to person and place  PAST MEDICAL HISTORY:  Past Medical History:  Diagnosis Date  . Acute renal failure superimposed on stage 3a chronic kidney disease ([place]) 09/07/2019  . B12 deficiency 09/17/2019  . Cancer ([place])   . CVA (cerebral vascular accident) ([place])   . Diabetes mellitus ([place])   . Hyperlipidemia   . Hypertension   . Right hemiparesis ([place]) 2017   Associated with stroke    SOCIAL HX:  Social History   Tobacco Use  . Smoking status: Never Smoker  . Smokeless tobacco: Never Used  Substance Use Topics  . Alcohol use: Never    Alcohol/week: 0.0 standard drinks    ALLERGIES: No Known Allergies   PERTINENT MEDICATIONS:  Outpatient Encounter Medications as of 03/07/2020  Medication Sig  . amLODipine (NORVASC) 10 MG tablet Take 1 tablet (10 mg total) by mouth daily.  Marland Kitchen aspirin EC 81 MG EC tablet Take 1 tablet (81 mg total) by mouth daily. Swallow  whole.  . capecitabine (XELODA) 500 MG tablet Take 3 tablets (1,500 mg total) by mouth 2 (two) times daily after a meal. Take Monday through Friday during radiation.  . clopidogrel (PLAVIX) 75 MG tablet Take 1 tablet (75 mg total) by mouth daily.  . ferrous sulfate 325 (65 FE) MG tablet Take 1 tablet (325 mg total) by mouth daily.  . finasteride (PROSCAR) 5 MG tablet Take 5 mg by mouth daily.  .  multivitamin (ONE-A-DAY MEN'S) TABS tablet Take 1 tablet by mouth daily.  Marland Kitchen nystatin (MYCOSTATIN) 100000 UNIT/ML suspension Take 5 mLs by mouth 4 (four) times daily. Swish and spit  Reported by [place] (Patient not taking: No sig reported)  . ondansetron (ZOFRAN) 8 MG tablet Take 1 tablet (8 mg total) by mouth 2 (two) times daily as needed for refractory nausea / vomiting. Start on day 3 after chemotherapy.  . prochlorperazine (COMPAZINE) 10 MG tablet Take 1 tablet (10 mg total) by mouth every 6 (six) hours as needed (Nausea or vomiting).  . vitamin B-12 (CYANOCOBALAMIN) 1000 MCG tablet Take 1,000 mcg by mouth daily.   No facility-administered encounter medications on file as of 03/07/2020.    PHYSICAL EXAM:   General: NAD, frail appearing, thin Cardiovascular: regular rate and rhythm Pulmonary: clear ant fields Abdomen: soft, nontender, + bowel sounds GU: no suprapubic tenderness Extremities: no edema, no joint deformities Skin: no rashes Neurological: Weakness but otherwise nonfocal  Cari Burgo Jenetta Downer, NP

## 2020-03-07 NOTE — Telephone Encounter (Signed)
Oral Oncology Patient Advocate Encounter  Reached out to patients nephew, [name], after [facility] was unable to reach him after 3 attempts for a refill call.  [name] stated that the facility Mr Lex is at has been able to fill Xeloda at their pharmacy.    Since patient is able to receive Xeloda from the facility, I unenrolled Mr Maule in our pharmacy services.    [facility] Phone [phone] Fax [phone] 03/07/2020 1:17 PM

## 2020-03-08 ENCOUNTER — Ambulatory Visit
Admission: RE | Admit: 2020-03-08 | Discharge: 2020-03-08 | Disposition: A | Discharge status: Home / Self Care | Payer: Medicare Other | Source: Ambulatory Visit | Attending: Radiation Oncology | Admitting: Radiation Oncology

## 2020-03-08 ENCOUNTER — Inpatient Hospital Stay: Payer: Medicare Other

## 2020-03-08 ENCOUNTER — Ambulatory Visit: Payer: Medicare Other

## 2020-03-08 DIAGNOSIS — C20 Malignant neoplasm of rectum: Secondary | ICD-10-CM | POA: Diagnosis not present

## 2020-03-09 ENCOUNTER — Inpatient Hospital Stay: Payer: Medicare Other

## 2020-03-09 ENCOUNTER — Other Ambulatory Visit: Payer: Self-pay

## 2020-03-09 ENCOUNTER — Inpatient Hospital Stay (HOSPITAL_BASED_OUTPATIENT_CLINIC_OR_DEPARTMENT_OTHER): Payer: Medicare Other | Admitting: Oncology

## 2020-03-09 ENCOUNTER — Ambulatory Visit
Admission: RE | Admit: 2020-03-09 | Discharge: 2020-03-09 | Disposition: A | Discharge status: Home / Self Care | Payer: Medicare Other | Source: Ambulatory Visit | Attending: Radiation Oncology | Admitting: Radiation Oncology

## 2020-03-09 ENCOUNTER — Encounter: Payer: Self-pay | Admitting: Oncology

## 2020-03-09 VITALS — BP 151/77 | HR 65 | Temp 96.5°F | Resp 18 | Wt 241.0 lb

## 2020-03-09 DIAGNOSIS — K521 Toxic gastroenteritis and colitis: Secondary | ICD-10-CM | POA: Diagnosis not present

## 2020-03-09 DIAGNOSIS — Z5111 Encounter for antineoplastic chemotherapy: Secondary | ICD-10-CM

## 2020-03-09 DIAGNOSIS — C20 Malignant neoplasm of rectum: Secondary | ICD-10-CM

## 2020-03-09 DIAGNOSIS — Z95828 Presence of other vascular implants and grafts: Secondary | ICD-10-CM

## 2020-03-09 DIAGNOSIS — T451X5A Adverse effect of antineoplastic and immunosuppressive drugs, initial encounter: Secondary | ICD-10-CM

## 2020-03-09 DIAGNOSIS — N1832 Chronic kidney disease, stage 3b: Secondary | ICD-10-CM

## 2020-03-09 DIAGNOSIS — D631 Anemia in chronic kidney disease: Secondary | ICD-10-CM

## 2020-03-09 LAB — CBC WITH DIFFERENTIAL/PLATELET
Abs Immature Granulocytes: 0.02 10*3/uL (ref 0.00–0.07)
Basophils Absolute: 0 10*3/uL (ref 0.0–0.1)
Basophils Relative: 1 %
Eosinophils Absolute: 0.2 10*3/uL (ref 0.0–0.5)
Eosinophils Relative: 4 %
HCT: 30.7 % — ABNORMAL LOW (ref 39.0–52.0)
Hemoglobin: 10.4 g/dL — ABNORMAL LOW (ref 13.0–17.0)
Immature Granulocytes: 0 %
Lymphocytes Relative: 8 %
Lymphs Abs: 0.5 10*3/uL — ABNORMAL LOW (ref 0.7–4.0)
MCH: 30.6 pg (ref 26.0–34.0)
MCHC: 33.9 g/dL (ref 30.0–36.0)
MCV: 90.3 fL (ref 80.0–100.0)
Monocytes Absolute: 0.8 10*3/uL (ref 0.1–1.0)
Monocytes Relative: 14 %
Neutro Abs: 3.9 10*3/uL (ref 1.7–7.7)
Neutrophils Relative %: 73 %
Platelets: 217 10*3/uL (ref 150–400)
RBC: 3.4 MIL/uL — ABNORMAL LOW (ref 4.22–5.81)
RDW: 15.1 % (ref 11.5–15.5)
WBC: 5.5 10*3/uL (ref 4.0–10.5)
nRBC: 0 % (ref 0.0–0.2)

## 2020-03-09 LAB — COMPREHENSIVE METABOLIC PANEL
ALT: 12 U/L (ref 0–44)
AST: 18 U/L (ref 15–41)
Albumin: 3.3 g/dL — ABNORMAL LOW (ref 3.5–5.0)
Alkaline Phosphatase: 67 U/L (ref 38–126)
Anion gap: 10 (ref 5–15)
BUN: 23 mg/dL (ref 8–23)
CO2: 27 mmol/L (ref 22–32)
Calcium: 8.9 mg/dL (ref 8.9–10.3)
Chloride: 107 mmol/L (ref 98–111)
Creatinine, Ser: 1.76 mg/dL — ABNORMAL HIGH (ref 0.61–1.24)
GFR, Estimated: 41 mL/min — ABNORMAL LOW (ref >60)
Glucose, Bld: 110 mg/dL — ABNORMAL HIGH (ref 70–99)
Potassium: 3.5 mmol/L (ref 3.5–5.1)
Sodium: 144 mmol/L (ref 135–145)
Total Bilirubin: 0.8 mg/dL (ref 0.3–1.2)
Total Protein: 6.6 g/dL (ref 6.5–8.1)

## 2020-03-09 MED ORDER — SODIUM CHLORIDE 0.9 % IV SOLN
Freq: Once | INTRAVENOUS | Status: AC
  Filled 2020-03-09: qty 250

## 2020-03-09 MED ORDER — HEPARIN SOD (PORK) LOCK FLUSH 100 UNIT/ML IV SOLN
500.0000 [IU] | Freq: Once | INTRAVENOUS | Status: AC
  Administered 2020-03-09: 500 [IU] via INTRAVENOUS
  Filled 2020-03-09: qty 5

## 2020-03-09 MED ORDER — SODIUM CHLORIDE 0.9% FLUSH
10.0000 mL | Freq: Once | INTRAVENOUS | Status: AC
  Administered 2020-03-09: 10 mL via INTRAVENOUS
  Filled 2020-03-09: qty 10

## 2020-03-09 NOTE — Progress Notes (Signed)
Patient here for oncology follow-up appointment, expresses no complaints or concerns at this time.    

## 2020-03-09 NOTE — Progress Notes (Signed)
Hematology/Oncology Follow Up Note [facility]  [phone] [phone]  Patient Care Team: Christopher Pitch, MD as PCP - General (Family Medicine) Borders, Christopher Boys, NP as Nurse Practitioner (Hospice and Palliative Medicine) Christopher Server, MD as Consulting Physician (Oncology) Christopher Filbert, MD as Referring Physician (Radiation Oncology) Christopher Coop, NP as Nurse Practitioner Christopher Jacks, RN as Oncology Nurse Navigator Christopher Pain, MD as Consulting Physician (Neurology) Christopher Bellows, MD as Consulting Physician (Gastroenterology) Christopher Post, MD as Consulting Physician (Psychiatry) Christopher Boston, MD as Consulting Physician (Colon and Rectal Surgery)   Name of the patient: Christopher Delacruz  [id number]  04/18/1950   REASON FOR VISIT  follow-up for rectal cancer  PERTINENT ONCOLOGY HISTORY Christopher Delacruz is a 70 y.o.[place] who has above oncology history reviewed by me today presented for follow up visit for management of locally advanced rectal cancer. -Initially presented to emergency room due to profound anemia. 09/11/2019 EGD showed duodenal erosions without bleeding 09/11/2019 colonoscopy preparation was poor.  Likely malignant partially obstructing tumor in the rectosigmoid colon area which was biopsied. 09/13/2019, repeat colonoscopy showed 12 mm polyps were found in the descending colon.  Polyp was sessile.  Polyp was resected and retrieved.  A polypoid ulcerated partially obstructing large mass was found from 10 to 20 cm proximal to the anus.  The mass was circumferential, measured 10 cm in length.  In addition the diameter measures 12 mm.  Oozing was present 09/11/2019, biopsy showed invasive colorectal adenocarcinoma.  Moderately differentiated. 09/13/2019, CT chest abdomen pelvis showed rectal wall mass with evidence of local invasion to the mesorectal fat and potential involvement of the right seminal vesicle.  Multiple  enlarged lymph nodes.  Small lung nodules 2 to 3 mm in the lung bilaterally.  Highly nonspecific.  Cannot rule out metastasis.  09/14/2019 MRI pelvis showed T4b N2 rectal cancer. 09/15/2019 MRI brain with and without contrast showed punctate focus of restricted diffusion most likely reflects acute or early subacute small vessel infarct.  No evidence of intracranial metastasis.  Multiple chronic infarct.  #Iron deficiency anemia, patient received PRBC transfusion and IV Venofer treatments. #Vitamin B12 deficiency, status Delacruz parenteral vitamin B12 injections during hospitalization.  Currently on vitamin B12 supplementation. #Patient was found to have very poor insight to his condition.  Was seen by psychiatrist and was deemed incompetent.  Patient's medical power of attorney is his nephew Christopher Delacruz.  #Mediport was placed by Dr. Dahlia Delacruz  # #H. pylori gastritis, patient finishes course of antibiotics and PPI # 09/26/2019- 01/10/20  FOLFOX x 8  01/19/2020, CT chest abdomen pelvis with contrast showed mild interval decrease in size of the circumferential mass involving the rectum.  Stable appearance borderline enlarged porta hepatic and aorto caval lymph nodes.  No new or progressive disease.Scattered punctated lung nodules, unchanged.  Clot versus fibrin sheath surrounding the intravascular component of the right portacatheter.  No report of Mediport malfunctioning and he is asymptomatic.  Monitor.  INTERVA L HISTORY 70 y.o. patient presents for follow-up of rectal cancer.Marland Kitchen  He reports feeling well.  No nausea vomiting.  Occasional diarrhea.  Patient is a poor historian due to dementia. No medication list was sent from his facility.   Review of Systems  Unable to perform ROS: Other (Cognitive impairment)  Constitutional: Negative for fatigue.  Respiratory: Negative for shortness of breath.   Gastrointestinal: Negative for abdominal Delacruz, blood in stool and constipation.      No Known  Allergies   Past Medical  History:  Diagnosis Date  . Acute renal failure superimposed on stage 3a chronic kidney disease ([place]) 09/07/2019  . B12 deficiency 09/17/2019  . Cancer ([place])   . CVA (cerebral vascular accident) ([place])   . Diabetes mellitus ([place])   . Hyperlipidemia   . Hypertension   . Right hemiparesis ([place]) 2017   Associated with stroke     Past Surgical History:  Procedure Laterality Date  . COLONOSCOPY N/A 09/11/2019   Procedure: COLONOSCOPY;  Surgeon: Christopher Lame, MD;  Location: [facility] ENDOSCOPY;  Service: Endoscopy;  Laterality: N/A;  . COLONOSCOPY WITH PROPOFOL N/A 09/12/2019   Procedure: COLONOSCOPY WITH PROPOFOL;  Surgeon: Christopher Bellows, MD;  Location: [facility] ENDOSCOPY;  Service: Gastroenterology;  Laterality: N/A;  . COLONOSCOPY WITH PROPOFOL N/A 09/13/2019   Procedure: COLONOSCOPY WITH PROPOFOL;  Surgeon: Christopher Bellows, MD;  Location[facility] ENDOSCOPY;  Service: Gastroenterology;  Laterality: N/A;  . ESOPHAGOGASTRODUODENOSCOPY N/A 09/11/2019   Procedure: ESOPHAGOGASTRODUODENOSCOPY (EGD);  Surgeon: Christopher Lame, MD;  Location: [facility] ENDOSCOPY;  Service: Endoscopy;  Laterality: N/A;  . EXPLORE EYE SOCKET    . PORTACATH PLACEMENT N/A 09/15/2019   Procedure: INSERTION PORT-A-CATH;  Surgeon: Jules Husbands, MD;  Location: [facility] ORS;  Service: General;  Laterality: N/A;    Social History   Socioeconomic History  . Marital status: Divorced    Spouse name: Not on file  . Number of children: Not on file  . Years of education: Not on file  . Highest education level: Not on file  Occupational History  . 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 Smoking status: Never Smoker  . Smokeless tobacco: Never Used  Vaping Use  . Vaping Use: Never used  Substance and Sexual Activity  . Alcohol use: Never    Alcohol/week: 0.0 standard drinks  . Drug use: Never  . Sexual activity: Not on file  Other Topics Concern   . Not on file  Social History Narrative   Nephew with Christopher Delacruz due to cognitive impairment of patient after stroke 2017      Lives in [place] in [place], [place]   Social Determinants of Health   Financial Resource Strain: Not on file  Food Insecurity: Not on file  Transportation Needs: Unmet Transportation Needs  . Lack of Transportation (Medical): Yes  . Lack of Transportation (Non-Medical): Yes  Physical Activity: Not on file  Stress: Not on file  Social Connections: Not on file  Intimate Partner Violence: Not on file    Family History  Problem Relation Age of Onset  . Heart attack Sister   . Leukemia Brother   . Stroke Brother      Current Outpatient Medications:  .  amLODipine (NORVASC) 10 MG tablet, Take 1 tablet (10 mg total) by mouth daily., Disp: 30 tablet, Rfl: 1 .  aspirin EC 81 MG EC tablet, Take 1 tablet (81 mg total) by mouth daily. Swallow whole., Disp: 30 tablet, Rfl: 11 .  capecitabine (XELODA) 500 MG tablet, Take 3 tablets (1,500 mg total) by mouth 2 (two) times daily after a meal. Take Monday through Friday during radiation., Disp: 150 tablet, Rfl: 0 .  clopidogrel (PLAVIX) 75 MG tablet, Take 1 tablet (75 mg total) by mouth daily., Disp: 30 tablet, Rfl: 1 .  ferrous sulfate 325 (65 FE) MG tablet, Take 1 tablet (325 mg total) by mouth daily., Disp: 30 tablet, Rfl:  3 .  finasteride (PROSCAR) 5 MG tablet, Take 5 mg by mouth daily., Disp: , Rfl:  .  multivitamin (ONE-A-DAY MEN'S) TABS tablet, Take 1 tablet by mouth daily., Disp: , Rfl:  .  nystatin (MYCOSTATIN) 100000 UNIT/ML suspension, Take 5 mLs by mouth 4 (four) times daily. Swish and spit  Reported by [place] (Patient not taking: No sig reported), Disp: , Rfl:  .  ondansetron (ZOFRAN) 8 MG tablet, Take 1 tablet (8 mg total) by mouth 2 (two) times daily as needed for refractory nausea / vomiting. Start on day 3 after chemotherapy., Disp: 30 tablet, Rfl: 1 .   prochlorperazine (COMPAZINE) 10 MG tablet, Take 1 tablet (10 mg total) by mouth every 6 (six) hours as needed (Nausea or vomiting)., Disp: 30 tablet, Rfl: 1 .  vitamin B-12 (CYANOCOBALAMIN) 1000 MCG tablet, Take 1,000 mcg by mouth daily., Disp: , Rfl:   Physical exam:  There were no vitals filed for this visit. Physical Exam Constitutional:      General: He is not in acute distress. HENT:     Head: Normocephalic and atraumatic.  Eyes:     General: No scleral icterus. Cardiovascular:     Rate and Rhythm: Normal rate and regular rhythm.     Heart sounds: Normal heart sounds.  Pulmonary:     Effort: Pulmonary effort is normal. No respiratory distress.     Breath sounds: No wheezing.  Abdominal:     General: Bowel sounds are normal. There is no distension.     Palpations: Abdomen is soft.  Musculoskeletal:        General: No deformity. Normal range of motion.     Cervical back: Normal range of motion and neck supple.  Skin:    General: Skin is warm and dry.     Findings: No erythema or rash.  Neurological:     Mental Status: He is alert. Mental status is at baseline.     Cranial Nerves: No cranial nerve deficit.     Coordination: Coordination normal.     Comments: Orientated x2  Psychiatric:        Mood and Affect: Mood normal.     CMP Latest Ref Rng & Units 03/02/2020  Glucose 70 - 99 mg/dL 109(H)  BUN 8 - 23 mg/dL 18  Creatinine 0.61 - 1.24 mg/dL 1.78(H)  Sodium 135 - 145 mmol/L 143  Potassium 3.5 - 5.1 mmol/L 3.8  Chloride 98 - 111 mmol/L 108  CO2 22 - 32 mmol/L 25  Calcium 8.9 - 10.3 mg/dL 8.9  Total Protein 6.5 - 8.1 g/dL 6.6  Total Bilirubin 0.3 - 1.2 mg/dL 0.8  Alkaline Phos 38 - 126 U/L 57  AST 15 - 41 U/L 17  ALT 0 - 44 U/L 12   CBC Latest Ref Rng & Units 03/02/2020  WBC 4.0 - 10.5 K/uL 4.7  Hemoglobin 13.0 - 17.0 g/dL 10.5(L)  Hematocrit 39.0 - 52.0 % 31.6(L)  Platelets 150 - 400 K/uL 203    RADIOGRAPHIC STUDIES: I have personally reviewed the  radiological images as listed and agreed with the findings in the report. No results found.   Assessment and plan 1. Rectal cancer ([place])   2. Encounter for antineoplastic chemotherapy   3. Anemia in stage 3b chronic kidney disease ([place])   4. Chemotherapy induced diarrhea   Cancer Staging Rectal cancer [facility]) Staging form: Colon and Rectum, AJCC 8th Edition - Clinical stage from 09/22/2019: Stage IIIC (cT4b, cN2, cM0) - Signed by  Christopher Server, MD on 09/22/2019  Stage IIIC rectal cancer cT4 N2 M0 rectal cancer Labs are reviewed and discussed with patient Clinically he tolerates well. Continue Xeloda 1500 mg twice daily on radiation days.  Not able to reach power of attorney Christopher Delacruz to update.  #  Chemotherapy induced nausea and vomiting, resolved.  Diarrhea, likely secondary to chemotherapy.  Electrolytes are stable. Weekly IV hydration session with 1 L of normal saline x1.  #Chronic kidney disease, creatinine stable.  Anemia, hemoglobin trends down, 10.4.  This is due to chemotherapy and radiation.  Continue monitor. 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  Continue monitor.  We spent sufficient time to discuss many aspect of care, questions were answered to patient's satisfaction. Follow up in 1 week   Christopher Server, MD, PhD Hematology Oncology [facility] at [facility] Pager- [phone] 03/09/2020

## 2020-03-09 NOTE — Progress Notes (Signed)
Patient tolerated infusion well today in fluid clinic, no concerns voiced. Patient discharged. Stable.

## 2020-03-12 ENCOUNTER — Other Ambulatory Visit: Payer: Self-pay | Admitting: Urology

## 2020-03-12 ENCOUNTER — Ambulatory Visit: Payer: Medicare Other

## 2020-03-12 ENCOUNTER — Encounter: Payer: Self-pay | Admitting: Oncology

## 2020-03-12 ENCOUNTER — Inpatient Hospital Stay: Payer: Medicare Other

## 2020-03-12 ENCOUNTER — Ambulatory Visit: Admission: RE | Admit: 2020-03-12 | Payer: Medicare Other | Source: Ambulatory Visit

## 2020-03-12 ENCOUNTER — Ambulatory Visit
Admission: RE | Admit: 2020-03-12 | Discharge: 2020-03-12 | Disposition: A | Discharge status: Home / Self Care | Payer: Medicare Other | Source: Ambulatory Visit | Attending: Radiation Oncology | Admitting: Radiation Oncology

## 2020-03-12 DIAGNOSIS — C20 Malignant neoplasm of rectum: Secondary | ICD-10-CM | POA: Diagnosis not present

## 2020-03-13 ENCOUNTER — Inpatient Hospital Stay: Payer: Medicare Other

## 2020-03-13 ENCOUNTER — Ambulatory Visit
Admission: RE | Admit: 2020-03-13 | Discharge: 2020-03-13 | Disposition: A | Discharge status: Home / Self Care | Payer: Medicare Other | Source: Ambulatory Visit | Attending: Radiation Oncology | Admitting: Radiation Oncology

## 2020-03-13 ENCOUNTER — Ambulatory Visit: Payer: Medicare Other

## 2020-03-13 DIAGNOSIS — C20 Malignant neoplasm of rectum: Secondary | ICD-10-CM | POA: Diagnosis not present

## 2020-03-14 ENCOUNTER — Ambulatory Visit: Payer: Medicare Other

## 2020-03-14 ENCOUNTER — Ambulatory Visit
Admission: RE | Admit: 2020-03-14 | Discharge: 2020-03-14 | Disposition: A | Discharge status: Home / Self Care | Payer: Medicare Other | Source: Ambulatory Visit | Attending: Radiation Oncology | Admitting: Radiation Oncology

## 2020-03-14 ENCOUNTER — Inpatient Hospital Stay: Payer: Medicare Other

## 2020-03-14 ENCOUNTER — Other Ambulatory Visit: Payer: Self-pay

## 2020-03-14 DIAGNOSIS — C20 Malignant neoplasm of rectum: Secondary | ICD-10-CM | POA: Diagnosis not present

## 2020-03-15 ENCOUNTER — Inpatient Hospital Stay: Payer: Medicare Other

## 2020-03-15 ENCOUNTER — Ambulatory Visit
Admission: RE | Admit: 2020-03-15 | Discharge: 2020-03-15 | Disposition: A | Discharge status: Home / Self Care | Payer: Medicare Other | Source: Ambulatory Visit | Attending: Radiation Oncology | Admitting: Radiation Oncology

## 2020-03-15 DIAGNOSIS — C20 Malignant neoplasm of rectum: Secondary | ICD-10-CM | POA: Diagnosis not present

## 2020-03-16 ENCOUNTER — Ambulatory Visit: Payer: Medicare Other

## 2020-03-16 ENCOUNTER — Ambulatory Visit: Payer: Medicare Other | Admitting: Oncology

## 2020-03-16 ENCOUNTER — Other Ambulatory Visit: Payer: Medicare Other

## 2020-03-19 ENCOUNTER — Other Ambulatory Visit: Payer: Self-pay | Admitting: *Deleted

## 2020-03-19 ENCOUNTER — Inpatient Hospital Stay: Payer: Medicare Other

## 2020-03-19 ENCOUNTER — Encounter: Payer: Self-pay | Admitting: Oncology

## 2020-03-19 ENCOUNTER — Inpatient Hospital Stay (HOSPITAL_BASED_OUTPATIENT_CLINIC_OR_DEPARTMENT_OTHER): Payer: Medicare Other | Admitting: Oncology

## 2020-03-19 VITALS — BP 154/82 | HR 58 | Temp 96.9°F | Resp 18 | Wt 247.6 lb

## 2020-03-19 DIAGNOSIS — Z8249 Family history of ischemic heart disease and other diseases of the circulatory system: Secondary | ICD-10-CM | POA: Insufficient documentation

## 2020-03-19 DIAGNOSIS — K521 Toxic gastroenteritis and colitis: Secondary | ICD-10-CM | POA: Diagnosis not present

## 2020-03-19 DIAGNOSIS — R599 Enlarged lymph nodes, unspecified: Secondary | ICD-10-CM | POA: Diagnosis not present

## 2020-03-19 DIAGNOSIS — R911 Solitary pulmonary nodule: Secondary | ICD-10-CM | POA: Diagnosis not present

## 2020-03-19 DIAGNOSIS — D631 Anemia in chronic kidney disease: Secondary | ICD-10-CM | POA: Insufficient documentation

## 2020-03-19 DIAGNOSIS — E860 Dehydration: Secondary | ICD-10-CM

## 2020-03-19 DIAGNOSIS — Z79899 Other long term (current) drug therapy: Secondary | ICD-10-CM | POA: Insufficient documentation

## 2020-03-19 DIAGNOSIS — C20 Malignant neoplasm of rectum: Secondary | ICD-10-CM | POA: Insufficient documentation

## 2020-03-19 DIAGNOSIS — Z823 Family history of stroke: Secondary | ICD-10-CM | POA: Insufficient documentation

## 2020-03-19 DIAGNOSIS — Z95828 Presence of other vascular implants and grafts: Secondary | ICD-10-CM | POA: Insufficient documentation

## 2020-03-19 DIAGNOSIS — Z8673 Personal history of transient ischemic attack (TIA), and cerebral infarction without residual deficits: Secondary | ICD-10-CM | POA: Insufficient documentation

## 2020-03-19 DIAGNOSIS — E538 Deficiency of other specified B group vitamins: Secondary | ICD-10-CM | POA: Diagnosis not present

## 2020-03-19 DIAGNOSIS — R4189 Other symptoms and signs involving cognitive functions and awareness: Secondary | ICD-10-CM | POA: Diagnosis not present

## 2020-03-19 DIAGNOSIS — D6481 Anemia due to antineoplastic chemotherapy: Secondary | ICD-10-CM | POA: Insufficient documentation

## 2020-03-19 DIAGNOSIS — Z806 Family history of leukemia: Secondary | ICD-10-CM | POA: Insufficient documentation

## 2020-03-19 DIAGNOSIS — N189 Chronic kidney disease, unspecified: Secondary | ICD-10-CM | POA: Insufficient documentation

## 2020-03-19 DIAGNOSIS — N1832 Chronic kidney disease, stage 3b: Secondary | ICD-10-CM

## 2020-03-19 DIAGNOSIS — I639 Cerebral infarction, unspecified: Secondary | ICD-10-CM | POA: Diagnosis not present

## 2020-03-19 DIAGNOSIS — F0150 Vascular dementia without behavioral disturbance: Secondary | ICD-10-CM | POA: Diagnosis not present

## 2020-03-19 DIAGNOSIS — T451X5A Adverse effect of antineoplastic and immunosuppressive drugs, initial encounter: Secondary | ICD-10-CM | POA: Diagnosis not present

## 2020-03-19 DIAGNOSIS — E611 Iron deficiency: Secondary | ICD-10-CM | POA: Diagnosis not present

## 2020-03-19 DIAGNOSIS — Z7189 Other specified counseling: Secondary | ICD-10-CM

## 2020-03-19 LAB — COMPREHENSIVE METABOLIC PANEL
ALT: 17 U/L (ref 0–44)
AST: 20 U/L (ref 15–41)
Albumin: 3.4 g/dL — ABNORMAL LOW (ref 3.5–5.0)
Alkaline Phosphatase: 73 U/L (ref 38–126)
Anion gap: 8 (ref 5–15)
BUN: 19 mg/dL (ref 8–23)
CO2: 28 mmol/L (ref 22–32)
Calcium: 9 mg/dL (ref 8.9–10.3)
Chloride: 107 mmol/L (ref 98–111)
Creatinine, Ser: 1.77 mg/dL — ABNORMAL HIGH (ref 0.61–1.24)
GFR, Estimated: 41 mL/min — ABNORMAL LOW (ref >60)
Glucose, Bld: 101 mg/dL — ABNORMAL HIGH (ref 70–99)
Potassium: 3.9 mmol/L (ref 3.5–5.1)
Sodium: 143 mmol/L (ref 135–145)
Total Bilirubin: 0.7 mg/dL (ref 0.3–1.2)
Total Protein: 6.8 g/dL (ref 6.5–8.1)

## 2020-03-19 LAB — CBC WITH DIFFERENTIAL/PLATELET
Abs Immature Granulocytes: 0.04 10*3/uL (ref 0.00–0.07)
Basophils Absolute: 0.1 10*3/uL (ref 0.0–0.1)
Basophils Relative: 1 %
Eosinophils Absolute: 0.2 10*3/uL (ref 0.0–0.5)
Eosinophils Relative: 3 %
HCT: 32.1 % — ABNORMAL LOW (ref 39.0–52.0)
Hemoglobin: 10.5 g/dL — ABNORMAL LOW (ref 13.0–17.0)
Immature Granulocytes: 1 %
Lymphocytes Relative: 8 %
Lymphs Abs: 0.5 10*3/uL — ABNORMAL LOW (ref 0.7–4.0)
MCH: 30.4 pg (ref 26.0–34.0)
MCHC: 32.7 g/dL (ref 30.0–36.0)
MCV: 93 fL (ref 80.0–100.0)
Monocytes Absolute: 0.8 10*3/uL (ref 0.1–1.0)
Monocytes Relative: 12 %
Neutro Abs: 4.9 10*3/uL (ref 1.7–7.7)
Neutrophils Relative %: 75 %
Platelets: 203 10*3/uL (ref 150–400)
RBC: 3.45 MIL/uL — ABNORMAL LOW (ref 4.22–5.81)
RDW: 15.8 % — ABNORMAL HIGH (ref 11.5–15.5)
WBC: 6.5 10*3/uL (ref 4.0–10.5)
nRBC: 0 % (ref 0.0–0.2)

## 2020-03-19 MED ORDER — HEPARIN SOD (PORK) LOCK FLUSH 100 UNIT/ML IV SOLN
500.0000 [IU] | Freq: Once | INTRAVENOUS | Status: AC
  Administered 2020-03-19: 500 [IU] via INTRAVENOUS
  Filled 2020-03-19: qty 5

## 2020-03-19 MED ORDER — SODIUM CHLORIDE 0.9 % IV SOLN
Freq: Once | INTRAVENOUS | Status: AC
  Filled 2020-03-19: qty 250

## 2020-03-19 MED ORDER — SODIUM CHLORIDE 0.9% FLUSH
10.0000 mL | INTRAVENOUS | Status: AC | PRN
  Administered 2020-03-19: 10 mL via INTRAVENOUS
  Filled 2020-03-19: qty 10

## 2020-03-19 NOTE — Progress Notes (Signed)
Pt here for follow up. No new concerns voiced. Pt did not bring MAR with him today and does not know what medication he is taking.

## 2020-03-19 NOTE — Progress Notes (Signed)
Hematology/Oncology Follow Up Note [place]  [phone] Fax:([phone]) [phone]  Patient Care Team: [name], MD as PCP - General (Family Medicine) Borders, Kirt Boys, NP as Nurse Practitioner (Hospice and Palliative Medicine) [name], MD as Consulting Physician (Oncology) [name], MD as Referring Physician (Radiation Oncology) [name], NP as Nurse Practitioner [name], RN as Oncology Nurse Navigator [name], MD as Consulting Physician (Neurology) [name], MD as Consulting Physician (Gastroenterology) [name], MD as Consulting Physician (Psychiatry) [name], MD as Consulting Physician (Colon and Rectal Surgery)   Name of the patient: Christopher Delacruz  [id number]  November 09, 1950   REASON FOR VISIT  follow-up for rectal cancer  PERTINENT ONCOLOGY HISTORY Jahlen Bollman is a 70 y.o.[place] who has above oncology history reviewed by me today presented for follow up visit for management of locally advanced rectal cancer. -Initially presented to emergency room due to profound anemia. 09/11/2019 EGD showed duodenal erosions without bleeding 09/11/2019 colonoscopy preparation was poor.  Likely malignant partially obstructing tumor in the rectosigmoid colon area which was biopsied. 09/13/2019, repeat colonoscopy showed 12 mm polyps were found in the descending colon.  Polyp was sessile.  Polyp was resected and retrieved.  A polypoid ulcerated partially obstructing large mass was found from 10 to 20 cm proximal to the anus.  The mass was circumferential, measured 10 cm in length.  In addition the diameter measures 12 mm.  Oozing was present 09/11/2019, biopsy showed invasive colorectal adenocarcinoma.  Moderately differentiated. 09/13/2019, CT chest abdomen pelvis showed rectal wall mass with evidence of local invasion to the mesorectal fat and potential involvement of the right seminal vesicle.  Multiple  enlarged lymph nodes.  Small lung nodules 2 to 3 mm in the lung bilaterally.  Highly nonspecific.  Cannot rule out metastasis.  09/14/2019 MRI pelvis showed T4b N2 rectal cancer. 09/15/2019 MRI brain with and without contrast showed punctate focus of restricted diffusion most likely reflects acute or early subacute small vessel infarct.  No evidence of intracranial metastasis.  Multiple chronic infarct.  #Iron deficiency anemia, patient received PRBC transfusion and IV Venofer treatments. #Vitamin B12 deficiency, status post parenteral vitamin B12 injections during hospitalization.  Currently on vitamin B12 supplementation. #Patient was found to have very poor insight to his condition.  Was seen by psychiatrist and was deemed incompetent.  Patient's medical power of attorney is his nephew [name].  #Mediport was placed by Dr. Dahlia Byes  # #H. pylori gastritis, patient finishes course of antibiotics and PPI # 09/26/2019- 01/10/20  FOLFOX x 8  01/19/2020, CT chest abdomen pelvis with contrast showed mild interval decrease in size of the circumferential mass involving the rectum.  Stable appearance borderline enlarged porta hepatic and aorto caval lymph nodes.  No new or progressive disease.Scattered punctated lung nodules, unchanged.  Clot versus fibrin sheath surrounding the intravascular component of the right portacatheter.  No report of Mediport malfunctioning and he is asymptomatic.  Monitor.  03/16/2020.  Finished concurrent chemotherapy-Xeloda 1500 mg twice daily with radiation INTERVA L HISTORY 70 y.o. patient presents for follow-up of rectal cancer..  Patient was accompanied by his power of attorney Mark. Patient reports feeling well.  No nausea vomiting diarrhea.   Review of Systems  Unable to perform ROS: Other (Cognitive impairment)  Constitutional: Negative for fatigue.  Respiratory: Negative for shortness of breath.   Gastrointestinal: Negative for abdominal pain, blood in stool and  constipation.      No Known Allergies   Past  Medical History:  Diagnosis Date  . Acute renal failure superimposed on stage 3a chronic kidney disease ([place]) 09/07/2019  . B12 deficiency 09/17/2019  . Cancer ([place])   . CVA (cerebral vascular accident) ([place])   . Diabetes mellitus ([place])   . Hyperlipidemia   . Hypertension   . Right hemiparesis ([place]) 2017   Associated with stroke     Past Surgical History:  Procedure Laterality Date  . COLONOSCOPY N/A 09/11/2019   Procedure: COLONOSCOPY;  Surgeon: [name], MD;  Location[facility] ENDOSCOPY;  Service: Endoscopy;  Laterality: N/A;  . COLONOSCOPY WITH PROPOFOL N/A 09/12/2019   Procedure: COLONOSCOPY WITH PROPOFOL;  Surgeon: [name], MD;  Location[facility] ENDOSCOPY;  Service: Gastroenterology;  Laterality: N/A;  . COLONOSCOPY WITH PROPOFOL N/A 09/13/2019   Procedure: COLONOSCOPY WITH PROPOFOL;  Surgeon: [name], MD;  Location: [facility] ENDOSCOPY;  Service: Gastroenterology;  Laterality: N/A;  . ESOPHAGOGASTRODUODENOSCOPY N/A 09/11/2019   Procedure: ESOPHAGOGASTRODUODENOSCOPY (EGD);  Surgeon: [name], MD;  Location: [facility] ENDOSCOPY;  Service: Endoscopy;  Laterality: N/A;  . EXPLORE EYE SOCKET    . PORTACATH PLACEMENT N/A 09/15/2019   Procedure: INSERTION PORT-A-CATH;  Surgeon: Jules Husbands, MD;  Location: [facility] ORS;  Service: General;  Laterality: N/A;    Social History   Socioeconomic History  . Marital status: Divorced    Spouse name: Not on file  . Number of children: Not on file  . Years of education: Not on file  . Highest education level: Not on file  Occupational History  . 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 Smoking status: Never Smoker  . Smokeless tobacco: Never Used  Vaping Use  . Vaping Use: Never used  Substance and Sexual Activity  . Alcohol use: Never    Alcohol/week: 0.0 standard drinks  . Drug use: Never  . Sexual activity: Not  on file  Other Topics Concern  . Not on file  Social History Narrative   Nephew with [place] due to cognitive impairment of patient after stroke 2017      Lives in [place] in [place], [place]   Social Determinants of Health   Financial Resource Strain: Not on file  Food Insecurity: Not on file  Transportation Needs: Unmet Transportation Needs  . Lack of Transportation (Medical): Yes  . Lack of Transportation (Non-Medical): Yes  Physical Activity: Not on file  Stress: Not on file  Social Connections: Not on file  Intimate Partner Violence: Not on file    Family History  Problem Relation Age of Onset  . Heart attack Sister   . Leukemia Brother   . Stroke Brother      Current Outpatient Medications:  .  amLODipine (NORVASC) 10 MG tablet, Take 1 tablet (10 mg total) by mouth daily., Disp: 30 tablet, Rfl: 1 .  aspirin EC 81 MG EC tablet, Take 1 tablet (81 mg total) by mouth daily. Swallow whole., Disp: 30 tablet, Rfl: 11 .  capecitabine (XELODA) 500 MG tablet, Take 3 tablets (1,500 mg total) by mouth 2 (two) times daily after a meal. Take Monday through Friday during radiation., Disp: 150 tablet, Rfl: 0 .  clopidogrel (PLAVIX) 75 MG tablet, Take 1 tablet (75 mg total) by mouth daily., Disp: 30 tablet, Rfl: 1 .  ferrous sulfate 325 (65 FE) MG tablet, Take 1 tablet (325 mg total) by mouth daily., Disp: 30 tablet,  Rfl: 3 .  finasteride (PROSCAR) 5 MG tablet, Take 5 mg by mouth daily., Disp: , Rfl:  .  multivitamin (ONE-A-DAY MEN'S) TABS tablet, Take 1 tablet by mouth daily., Disp: , Rfl:  .  nystatin (MYCOSTATIN) 100000 UNIT/ML suspension, Take 5 mLs by mouth 4 (four) times daily. Swish and spit  Reported by [place] (Patient not taking: No sig reported), Disp: , Rfl:  .  ondansetron (ZOFRAN) 8 MG tablet, Take 1 tablet (8 mg total) by mouth 2 (two) times daily as needed for refractory nausea / vomiting. Start on day 3 after chemotherapy.,  Disp: 30 tablet, Rfl: 1 .  prochlorperazine (COMPAZINE) 10 MG tablet, Take 1 tablet (10 mg total) by mouth every 6 (six) hours as needed (Nausea or vomiting)., Disp: 30 tablet, Rfl: 1 .  vitamin B-12 (CYANOCOBALAMIN) 1000 MCG tablet, Take 1,000 mcg by mouth daily., Disp: , Rfl:  No current facility-administered medications for this visit.  Facility-Administered Medications Ordered in Other Visits:  .  sodium chloride flush (NS) 0.9 % injection 10 mL, 10 mL, Intravenous, PRN, [name], MD, 10 mL at 03/19/20 1015  Physical exam:  Vitals:   03/19/20 1021  BP: (!) 154/82  Pulse: (!) 58  Resp: 18  Temp: (!) 96.9 F (36.1 C)  SpO2: 99%  Weight: 247 lb 9.6 oz (112.3 kg)   Physical Exam Constitutional:      General: He is not in acute distress. HENT:     Head: Normocephalic and atraumatic.  Eyes:     General: No scleral icterus. Cardiovascular:     Rate and Rhythm: Normal rate and regular rhythm.     Heart sounds: Normal heart sounds.  Pulmonary:     Effort: Pulmonary effort is normal. No respiratory distress.     Breath sounds: No wheezing.  Abdominal:     General: Bowel sounds are normal. There is no distension.     Palpations: Abdomen is soft.  Musculoskeletal:        General: No deformity. Normal range of motion.     Cervical back: Normal range of motion and neck supple.  Skin:    General: Skin is warm and dry.     Findings: No erythema or rash.  Neurological:     Mental Status: He is alert. Mental status is at baseline.     Cranial Nerves: No cranial nerve deficit.     Coordination: Coordination normal.     Comments: Orientated x2  Psychiatric:        Mood and Affect: Mood normal.     CMP Latest Ref Rng & Units 03/19/2020  Glucose 70 - 99 mg/dL 101(H)  BUN 8 - 23 mg/dL 19  Creatinine 0.61 - 1.24 mg/dL 1.77(H)  Sodium 135 - 145 mmol/L 143  Potassium 3.5 - 5.1 mmol/L 3.9  Chloride 98 - 111 mmol/L 107  CO2 22 - 32 mmol/L 28  Calcium 8.9 - 10.3 mg/dL 9.0  Total  Protein 6.5 - 8.1 g/dL 6.8  Total Bilirubin 0.3 - 1.2 mg/dL 0.7  Alkaline Phos 38 - 126 U/L 73  AST 15 - 41 U/L 20  ALT 0 - 44 U/L 17   CBC Latest Ref Rng & Units 03/19/2020  WBC 4.0 - 10.5 K/uL 6.5  Hemoglobin 13.0 - 17.0 g/dL 10.5(L)  Hematocrit 39.0 - 52.0 % 32.1(L)  Platelets 150 - 400 K/uL 203    RADIOGRAPHIC STUDIES: I have personally reviewed the radiological images as listed and agreed with the findings  in the report. No results found.   Assessment and plan 1. Rectal cancer ([place])   2. Port-A-Cath in place   3. Anemia in stage 3b chronic kidney disease ([place])   4. Chemotherapy induced diarrhea   5. Cognitive impairment   6. Goals of care, counseling/discussion   Cancer Staging Rectal cancer [facility]) Staging form: Colon and Rectum, AJCC 8th Edition - Clinical stage from 09/22/2019: Stage IIIC (cT4b, cN2, cM0) - Signed by [name], MD on 09/22/2019  Stage IIIC rectal cancer cT4 N2 M0 rectal cancer Finished concurrent chemoradiation. Labs are reviewed and discussed with patient and nephew [name]. Counts are stable.  Chemotherapy-induced diarrhea, difficult to quantify diarrhea severity due to patient's mental status and the fact that nursing home only document diarrhea when patient is laundry being picked up twice weekly. I recommend patient to proceed with IV fluid 1 L of normal saline today for hydration and repeat next week. #Chronic kidney disease, creatinine stable.  Anemia, hemoglobin trends down, 10.5  This is due to chemotherapy and radiation.  Continue monitor. 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  Continue monitor.  We spent sufficient time to discuss many aspect of care, questions were answered to patient's satisfaction.  Patient has an follow-up appointment with Dr. Johney Maine for presurgery evaluation.  I will defer to Dr. Johney Maine for images prior to the surgery.  Plan was discussed with RN Navigator Kristi for coordination.  Patient will return visit  in 2 weeks for repeat blood work and IV fluid.  [name], MD, PhD Hematology Oncology [facility] Pager- [phone] 03/19/2020

## 2020-03-19 NOTE — Progress Notes (Signed)
Received IV hydration. Tolerated well. Discharged from clinic to [facility] transport. No complaints voiced.

## 2020-03-27 ENCOUNTER — Inpatient Hospital Stay: Payer: Medicare Other | Attending: Oncology

## 2020-03-27 ENCOUNTER — Inpatient Hospital Stay: Payer: Medicare Other

## 2020-03-27 VITALS — BP 137/62 | HR 60 | Temp 97.1°F | Resp 18

## 2020-03-27 DIAGNOSIS — C20 Malignant neoplasm of rectum: Secondary | ICD-10-CM | POA: Insufficient documentation

## 2020-03-27 DIAGNOSIS — E860 Dehydration: Secondary | ICD-10-CM

## 2020-03-27 DIAGNOSIS — R160 Hepatomegaly, not elsewhere classified: Secondary | ICD-10-CM | POA: Insufficient documentation

## 2020-03-27 DIAGNOSIS — R599 Enlarged lymph nodes, unspecified: Secondary | ICD-10-CM | POA: Insufficient documentation

## 2020-03-27 DIAGNOSIS — Z79899 Other long term (current) drug therapy: Secondary | ICD-10-CM | POA: Diagnosis not present

## 2020-03-27 DIAGNOSIS — R918 Other nonspecific abnormal finding of lung field: Secondary | ICD-10-CM | POA: Insufficient documentation

## 2020-03-27 DIAGNOSIS — Z8249 Family history of ischemic heart disease and other diseases of the circulatory system: Secondary | ICD-10-CM | POA: Insufficient documentation

## 2020-03-27 DIAGNOSIS — F0150 Vascular dementia without behavioral disturbance: Secondary | ICD-10-CM | POA: Diagnosis not present

## 2020-03-27 DIAGNOSIS — K521 Toxic gastroenteritis and colitis: Secondary | ICD-10-CM | POA: Insufficient documentation

## 2020-03-27 DIAGNOSIS — E538 Deficiency of other specified B group vitamins: Secondary | ICD-10-CM | POA: Insufficient documentation

## 2020-03-27 DIAGNOSIS — N1831 Chronic kidney disease, stage 3a: Secondary | ICD-10-CM | POA: Diagnosis not present

## 2020-03-27 DIAGNOSIS — Z823 Family history of stroke: Secondary | ICD-10-CM | POA: Insufficient documentation

## 2020-03-27 DIAGNOSIS — Z806 Family history of leukemia: Secondary | ICD-10-CM | POA: Insufficient documentation

## 2020-03-27 DIAGNOSIS — D509 Iron deficiency anemia, unspecified: Secondary | ICD-10-CM | POA: Diagnosis not present

## 2020-03-27 LAB — CBC WITH DIFFERENTIAL/PLATELET
Abs Immature Granulocytes: 0.03 10*3/uL (ref 0.00–0.07)
Basophils Absolute: 0.1 10*3/uL (ref 0.0–0.1)
Basophils Relative: 1 %
Eosinophils Absolute: 0.2 10*3/uL (ref 0.0–0.5)
Eosinophils Relative: 4 %
HCT: 33.8 % — ABNORMAL LOW (ref 39.0–52.0)
Hemoglobin: 11 g/dL — ABNORMAL LOW (ref 13.0–17.0)
Immature Granulocytes: 1 %
Lymphocytes Relative: 10 %
Lymphs Abs: 0.6 10*3/uL — ABNORMAL LOW (ref 0.7–4.0)
MCH: 30.5 pg (ref 26.0–34.0)
MCHC: 32.5 g/dL (ref 30.0–36.0)
MCV: 93.6 fL (ref 80.0–100.0)
Monocytes Absolute: 0.9 10*3/uL (ref 0.1–1.0)
Monocytes Relative: 15 %
Neutro Abs: 4.3 10*3/uL (ref 1.7–7.7)
Neutrophils Relative %: 69 %
Platelets: 234 10*3/uL (ref 150–400)
RBC: 3.61 MIL/uL — ABNORMAL LOW (ref 4.22–5.81)
RDW: 14.7 % (ref 11.5–15.5)
WBC: 6.1 10*3/uL (ref 4.0–10.5)
nRBC: 0 % (ref 0.0–0.2)

## 2020-03-27 LAB — COMPREHENSIVE METABOLIC PANEL
ALT: 14 U/L (ref 0–44)
AST: 21 U/L (ref 15–41)
Albumin: 3.6 g/dL (ref 3.5–5.0)
Alkaline Phosphatase: 74 U/L (ref 38–126)
Anion gap: 11 (ref 5–15)
BUN: 27 mg/dL — ABNORMAL HIGH (ref 8–23)
CO2: 28 mmol/L (ref 22–32)
Calcium: 9.2 mg/dL (ref 8.9–10.3)
Chloride: 104 mmol/L (ref 98–111)
Creatinine, Ser: 1.92 mg/dL — ABNORMAL HIGH (ref 0.61–1.24)
GFR, Estimated: 37 mL/min — ABNORMAL LOW (ref >60)
Glucose, Bld: 139 mg/dL — ABNORMAL HIGH (ref 70–99)
Potassium: 3.8 mmol/L (ref 3.5–5.1)
Sodium: 143 mmol/L (ref 135–145)
Total Bilirubin: 0.7 mg/dL (ref 0.3–1.2)
Total Protein: 7 g/dL (ref 6.5–8.1)

## 2020-03-27 MED ORDER — SODIUM CHLORIDE 0.9 % IV SOLN
Freq: Once | INTRAVENOUS | Status: AC
  Filled 2020-03-27: qty 250

## 2020-03-27 MED ORDER — SODIUM CHLORIDE 0.9% FLUSH
10.0000 mL | INTRAVENOUS | Status: DC | PRN
  Administered 2020-03-27: 10 mL via INTRAVENOUS
  Filled 2020-03-27: qty 10

## 2020-03-27 MED ORDER — HEPARIN SOD (PORK) LOCK FLUSH 100 UNIT/ML IV SOLN
500.0000 [IU] | Freq: Once | INTRAVENOUS | Status: AC
  Administered 2020-03-27: 500 [IU] via INTRAVENOUS
  Filled 2020-03-27: qty 5

## 2020-03-27 NOTE — Progress Notes (Signed)
Received IV hydration today. Denies N/V. Tiny bit of diarrhea at times. No complaints at discharge. Call placed for Christopher Delacruz pick up to facility.

## 2020-04-02 ENCOUNTER — Inpatient Hospital Stay: Payer: Medicare Other

## 2020-04-02 ENCOUNTER — Inpatient Hospital Stay (HOSPITAL_BASED_OUTPATIENT_CLINIC_OR_DEPARTMENT_OTHER): Payer: Medicare Other | Admitting: Oncology

## 2020-04-02 ENCOUNTER — Encounter: Payer: Self-pay | Admitting: Oncology

## 2020-04-02 ENCOUNTER — Other Ambulatory Visit: Payer: Self-pay

## 2020-04-02 VITALS — BP 154/63 | HR 64 | Temp 96.6°F | Resp 18 | Wt 237.0 lb

## 2020-04-02 DIAGNOSIS — R4189 Other symptoms and signs involving cognitive functions and awareness: Secondary | ICD-10-CM

## 2020-04-02 DIAGNOSIS — Z95828 Presence of other vascular implants and grafts: Secondary | ICD-10-CM

## 2020-04-02 DIAGNOSIS — C20 Malignant neoplasm of rectum: Secondary | ICD-10-CM

## 2020-04-02 DIAGNOSIS — K521 Toxic gastroenteritis and colitis: Secondary | ICD-10-CM | POA: Diagnosis not present

## 2020-04-02 DIAGNOSIS — T451X5A Adverse effect of antineoplastic and immunosuppressive drugs, initial encounter: Secondary | ICD-10-CM

## 2020-04-02 LAB — CBC WITH DIFFERENTIAL/PLATELET
Abs Immature Granulocytes: 0.02 10*3/uL (ref 0.00–0.07)
Basophils Absolute: 0.1 10*3/uL (ref 0.0–0.1)
Basophils Relative: 1 %
Eosinophils Absolute: 0.2 10*3/uL (ref 0.0–0.5)
Eosinophils Relative: 4 %
HCT: 33.1 % — ABNORMAL LOW (ref 39.0–52.0)
Hemoglobin: 10.7 g/dL — ABNORMAL LOW (ref 13.0–17.0)
Immature Granulocytes: 0 %
Lymphocytes Relative: 10 %
Lymphs Abs: 0.6 10*3/uL — ABNORMAL LOW (ref 0.7–4.0)
MCH: 30.6 pg (ref 26.0–34.0)
MCHC: 32.3 g/dL (ref 30.0–36.0)
MCV: 94.6 fL (ref 80.0–100.0)
Monocytes Absolute: 0.8 10*3/uL (ref 0.1–1.0)
Monocytes Relative: 12 %
Neutro Abs: 4.6 10*3/uL (ref 1.7–7.7)
Neutrophils Relative %: 73 %
Platelets: 205 10*3/uL (ref 150–400)
RBC: 3.5 MIL/uL — ABNORMAL LOW (ref 4.22–5.81)
RDW: 14.3 % (ref 11.5–15.5)
WBC: 6.4 10*3/uL (ref 4.0–10.5)
nRBC: 0 % (ref 0.0–0.2)

## 2020-04-02 LAB — COMPREHENSIVE METABOLIC PANEL
ALT: 13 U/L (ref 0–44)
AST: 18 U/L (ref 15–41)
Albumin: 3.5 g/dL (ref 3.5–5.0)
Alkaline Phosphatase: 68 U/L (ref 38–126)
Anion gap: 10 (ref 5–15)
BUN: 29 mg/dL — ABNORMAL HIGH (ref 8–23)
CO2: 27 mmol/L (ref 22–32)
Calcium: 8.9 mg/dL (ref 8.9–10.3)
Chloride: 105 mmol/L (ref 98–111)
Creatinine, Ser: 1.85 mg/dL — ABNORMAL HIGH (ref 0.61–1.24)
GFR, Estimated: 39 mL/min — ABNORMAL LOW (ref >60)
Glucose, Bld: 108 mg/dL — ABNORMAL HIGH (ref 70–99)
Potassium: 4 mmol/L (ref 3.5–5.1)
Sodium: 142 mmol/L (ref 135–145)
Total Bilirubin: 0.7 mg/dL (ref 0.3–1.2)
Total Protein: 7 g/dL (ref 6.5–8.1)

## 2020-04-02 MED ORDER — HEPARIN SOD (PORK) LOCK FLUSH 100 UNIT/ML IV SOLN
500.0000 [IU] | Freq: Once | INTRAVENOUS | Status: AC
  Administered 2020-04-02: 500 [IU] via INTRAVENOUS
  Filled 2020-04-02: qty 5

## 2020-04-02 MED ORDER — SODIUM CHLORIDE 0.9 % IV SOLN
Freq: Once | INTRAVENOUS | Status: AC
  Filled 2020-04-02: qty 250

## 2020-04-02 MED ORDER — SODIUM CHLORIDE 0.9% FLUSH
10.0000 mL | INTRAVENOUS | Status: DC | PRN
  Administered 2020-04-02: 10 mL via INTRAVENOUS
  Filled 2020-04-02: qty 10

## 2020-04-02 NOTE — Progress Notes (Signed)
Patient here for oncology follow-up appointment, expresses concerns of light ocasional diarhhea

## 2020-04-02 NOTE — Progress Notes (Signed)
Patient tolerated IV fluids infusion well today, states he feels better, no concerns voiced. Seen MD. Patient discharged. Stable.

## 2020-04-02 NOTE — Progress Notes (Signed)
Hematology/Oncology Follow Up Note [place]  Telephone:([phone]) [phone] [phone]  Patient Care Team: [name], MD as PCP - General (Family Medicine) Borders, Kirt Boys, NP as Nurse Practitioner (Hospice and Palliative Medicine) [name], MD as Consulting Physician (Oncology) [name], MD as Referring Physician (Radiation Oncology) [name], NP as Nurse Practitioner [name], RN as Oncology Nurse Navigator [name], MD as Consulting Physician (Neurology) [name], MD as Consulting Physician (Gastroenterology) [name], MD as Consulting Physician (Psychiatry) [name], MD as Consulting Physician (Colon and Rectal Surgery)   Name of the patient: Christopher Delacruz  [id number]  1950/12/01   REASON FOR VISIT  follow-up for rectal cancer  PERTINENT ONCOLOGY HISTORY Christopher Delacruz is a 70 y.o[place] who has above oncology history reviewed by me today presented for follow up visit for management of locally advanced rectal cancer. -Initially presented to emergency room due to profound anemia. 09/11/2019 EGD showed duodenal erosions without bleeding 09/11/2019 colonoscopy preparation was poor.  Likely malignant partially obstructing tumor in the rectosigmoid colon area which was biopsied. 09/13/2019, repeat colonoscopy showed 12 mm polyps were found in the descending colon.  Polyp was sessile.  Polyp was resected and retrieved.  A polypoid ulcerated partially obstructing large mass was found from 10 to 20 cm proximal to the anus.  The mass was circumferential, measured 10 cm in length.  In addition the diameter measures 12 mm.  Oozing was present 09/11/2019, biopsy showed invasive colorectal adenocarcinoma.  Moderately differentiated. 09/13/2019, CT chest abdomen pelvis showed rectal wall mass with evidence of local invasion to the mesorectal fat and potential involvement of the right seminal vesicle.  Multiple  enlarged lymph nodes.  Small lung nodules 2 to 3 mm in the lung bilaterally.  Highly nonspecific.  Cannot rule out metastasis.  09/14/2019 MRI pelvis showed T4b N2 rectal cancer. 09/15/2019 MRI brain with and without contrast showed punctate focus of restricted diffusion most likely reflects acute or early subacute small vessel infarct.  No evidence of intracranial metastasis.  Multiple chronic infarct.  #Iron deficiency anemia, patient received PRBC transfusion and IV Venofer treatments. #Vitamin B12 deficiency, status post parenteral vitamin B12 injections during hospitalization.  Currently on vitamin B12 supplementation. #Patient was found to have very poor insight to his condition.  Was seen by psychiatrist and was deemed incompetent.  Patient's medical power of attorney is his nephew Christopher Delacruz.  #Mediport was placed by Dr. Dahlia Byes  # #H. pylori gastritis, patient finishes course of antibiotics and PPI # 09/26/2019- 01/10/20  FOLFOX x 8  01/19/2020, CT chest abdomen pelvis with contrast showed mild interval decrease in size of the circumferential mass involving the rectum.  Stable appearance borderline enlarged porta hepatic and aorto caval lymph nodes.  No new or progressive disease.Scattered punctated lung nodules, unchanged.  Clot versus fibrin sheath surrounding the intravascular component of the right portacatheter.  No report of Mediport malfunctioning and he is asymptomatic.  Monitor.  03/16/2020.  Finished concurrent chemotherapy-Xeloda 1500 mg twice daily with radiation INTERVA L HISTORY 70 y.o. patient presents for follow-up of rectal cancer.Mar[place] Kitchen  He reports no new complaints.  No nausea vomiting, chronic diarrhea is minimal.  Appetite is good    Review of Systems  Unable to perform ROS: Other (Cognitive impairment)  Constitutional: Negative for fatigue.  Respiratory: Negative for shortness of breath.   Gastrointestinal: Negative for abdominal pain, blood in stool and constipation.       No Known Allergies   Past  Medical History:  Diagnosis Date  . Acute renal failure superimposed on stage 3a chronic kidney disease ([place]) 09/07/2019  . B12 deficiency 09/17/2019  . Cancer ([place])   . CVA (cerebral vascular accident) ([place])   . Diabetes mellitus ([place])   . Hyperlipidemia   . Hypertension   . Right hemiparesis ([place]) 2017   Associated with stroke     Past Surgical History:  Procedure Laterality Date  . COLONOSCOPY N/A 09/11/2019   Procedure: COLONOSCOPY;  Surgeon: [name], MD;  Location[facility] ENDOSCOPY;  Service: Endoscopy;  Laterality: N/A;  . COLONOSCOPY WITH PROPOFOL N/A 09/12/2019   Procedure: COLONOSCOPY WITH PROPOFOL;  Surgeon: [name], MD;  Location: [facility] ENDOSCOPY;  Service: Gastroenterology;  Laterality: N/A;  . COLONOSCOPY WITH PROPOFOL N/A 09/13/2019   Procedure: COLONOSCOPY WITH PROPOFOL;  Surgeon: [name], MD;  Location: [facility] ENDOSCOPY;  Service: Gastroenterology;  Laterality: N/A;  . ESOPHAGOGASTRODUODENOSCOPY N/A 09/11/2019   Procedure: ESOPHAGOGASTRODUODENOSCOPY (EGD);  Surgeon: [name], MD;  Location: [facility] ENDOSCOPY;  Service: Endoscopy;  Laterality: N/A;  . EXPLORE EYE SOCKET    . PORTACATH PLACEMENT N/A 09/15/2019   Procedure: INSERTION PORT-A-CATH;  Surgeon: Jules Husbands, MD;  Location: [facility] ORS;  Service: General;  Laterality: N/A;    Social History   Socioeconomic History  . Marital status: Divorced    Spouse name: Not on file  . Number of children: Not on file  . Years of education: Not on file  . Highest education level: Not on file  Occupational History  . 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 Smoking status: Never Smoker  . Smokeless tobacco: Never Used  Vaping Use  . Vaping Use: Never used  Substance and Sexual Activity  . Alcohol use: Never    Alcohol/week: 0.0 standard drinks  . Drug use: Never  . Sexual activity: Not on file  Other  Topics Concern  . Not on file  Social History Narrative   Nephew with [place] due to cognitive impairment of patient after stroke 2017      Lives in [place] in [place], [place]   Social Determinants of Health   Financial Resource Strain: Not on file  Food Insecurity: Not on file  Transportation Needs: Unmet Transportation Needs  . Lack of Transportation (Medical): Yes  . Lack of Transportation (Non-Medical): Yes  Physical Activity: Not on file  Stress: Not on file  Social Connections: Not on file  Intimate Partner Violence: Not on file    Family History  Problem Relation Age of Onset  . Heart attack Sister   . Leukemia Brother   . Stroke Brother      Current Outpatient Medications:  .  amLODipine (NORVASC) 10 MG tablet, Take 1 tablet (10 mg total) by mouth daily., Disp: 30 tablet, Rfl: 1 .  aspirin EC 81 MG EC tablet, Take 1 tablet (81 mg total) by mouth daily. Swallow whole., Disp: 30 tablet, Rfl: 11 .  capecitabine (XELODA) 500 MG tablet, Take 3 tablets (1,500 mg total) by mouth 2 (two) times daily after a meal. Take Monday through Friday during radiation., Disp: 150 tablet, Rfl: 0 .  clopidogrel (PLAVIX) 75 MG tablet, Take 1 tablet (75 mg total) by mouth daily., Disp: 30 tablet, Rfl: 1 .  ferrous sulfate 325 (65 FE) MG tablet, Take 1 tablet (325 mg total) by mouth daily., Disp: 30 tablet,  Rfl: 3 .  finasteride (PROSCAR) 5 MG tablet, Take 5 mg by mouth daily., Disp: , Rfl:  .  multivitamin (ONE-A-DAY MEN'S) TABS tablet, Take 1 tablet by mouth daily., Disp: , Rfl:  .  ondansetron (ZOFRAN) 8 MG tablet, Take 1 tablet (8 mg total) by mouth 2 (two) times daily as needed for refractory nausea / vomiting. Start on day 3 after chemotherapy., Disp: 30 tablet, Rfl: 1 .  prochlorperazine (COMPAZINE) 10 MG tablet, Take 1 tablet (10 mg total) by mouth every 6 (six) hours as needed (Nausea or vomiting)., Disp: 30 tablet, Rfl: 1 .  vitamin B-12  (CYANOCOBALAMIN) 1000 MCG tablet, Take 1,000 mcg by mouth daily., Disp: , Rfl:  .  nystatin (MYCOSTATIN) 100000 UNIT/ML suspension, Take 5 mLs by mouth 4 (four) times daily. Swish and spit  Reported by [place] (Patient not taking: No sig reported), Disp: , Rfl:  No current facility-administered medications for this visit.  Facility-Administered Medications Ordered in Other Visits:  .  sodium chloride flush (NS) 0.9 % injection 10 mL, 10 mL, Intravenous, PRN, [name], MD, 10 mL at 03/19/20 1015  Physical exam:  Vitals:   04/02/20 0941  BP: (!) 154/63  Pulse: 64  Resp: 18  Temp: (!) 96.6 F (35.9 C)  TempSrc: Tympanic  SpO2: 97%  Weight: 237 lb (107.5 kg)   Physical Exam Constitutional:      General: He is not in acute distress. HENT:     Head: Normocephalic and atraumatic.  Eyes:     General: No scleral icterus. Cardiovascular:     Rate and Rhythm: Normal rate and regular rhythm.     Heart sounds: Normal heart sounds.  Pulmonary:     Effort: Pulmonary effort is normal. No respiratory distress.     Breath sounds: No wheezing.  Abdominal:     General: Bowel sounds are normal. There is no distension.     Palpations: Abdomen is soft.  Musculoskeletal:        General: No deformity. Normal range of motion.     Cervical back: Normal range of motion and neck supple.  Skin:    General: Skin is warm and dry.     Findings: No erythema or rash.  Neurological:     Mental Status: He is alert. Mental status is at baseline.     Cranial Nerves: No cranial nerve deficit.     Coordination: Coordination normal.     Comments: Orientated x2  Psychiatric:        Mood and Affect: Mood normal.     CMP Latest Ref Rng & Units 04/02/2020  Glucose 70 - 99 mg/dL 108(H)  BUN 8 - 23 mg/dL 29(H)  Creatinine 0.61 - 1.24 mg/dL 1.85(H)  Sodium 135 - 145 mmol/L 142  Potassium 3.5 - 5.1 mmol/L 4.0  Chloride 98 - 111 mmol/L 105  CO2 22 - 32 mmol/L 27  Calcium 8.9 - 10.3 mg/dL 8.9  Total  Protein 6.5 - 8.1 g/dL 7.0  Total Bilirubin 0.3 - 1.2 mg/dL 0.7  Alkaline Phos 38 - 126 U/L 68  AST 15 - 41 U/L 18  ALT 0 - 44 U/L 13   CBC Latest Ref Rng & Units 04/02/2020  WBC 4.0 - 10.5 K/uL 6.4  Hemoglobin 13.0 - 17.0 g/dL 10.7(L)  Hematocrit 39.0 - 52.0 % 33.1(L)  Platelets 150 - 400 K/uL 205    RADIOGRAPHIC STUDIES: I have personally reviewed the radiological images as listed and agreed with the findings  in the report. No results found.   Assessment and plan 1. Rectal cancer ([place])   2. Port-A-Cath in place   3. Cognitive impairment   4. Chemotherapy induced diarrhea   Cancer Staging Rectal cancer [facility]) Staging form: Colon and Rectum, AJCC 8th Edition - Clinical stage from 09/22/2019: Stage IIIC (cT4b, cN2, cM0) - Signed by [name], MD on 09/22/2019  Stage IIIC rectal cancer cT4 N2 M0 rectal cancer Finished concurrent chemoradiation. Labs are reviewed and are discussed with patient. Counts are stable.   Chemotherapy-induced diarrhea, difficult to quantify diarrhea severity due to patient's mental status and the fact that nursing home only document diarrhea when patient is laundry being picked up twice weekly. Patient gets 1 L of IV normal saline x1 today prophylactically. He seems to have good appetite and tolerated p.o. very well.  And I encourage him to increase oral hydration . #Chronic kidney disease, creatinine improved from last week. #Port-A-Cath in place, Mediport need to be flushed every 8 weeks Anemia,  hemoglobin is stable.  Slightly improved .  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  Continue monitor.  We spent sufficient time to discuss many aspect of care, questions were answered to patient's satisfaction. Patient has an follow-up appointment with Dr. Johney Maine for presurgery evaluation.  I will defer to Dr. Johney Maine for images prior to the surgery.  Plan was discussed with RN Navigator Kristi for coordination.  Patient will return visit after  surgery, or earlier if needed. Also called patient's power of attorney Christopher Delacruz and updated him.  [name], MD, PhD Hematology Oncology [facility] Pager- [phone] 04/02/2020

## 2020-04-18 ENCOUNTER — Encounter: Payer: Self-pay | Admitting: Radiation Oncology

## 2020-04-18 ENCOUNTER — Other Ambulatory Visit: Payer: Self-pay

## 2020-04-18 ENCOUNTER — Ambulatory Visit
Admission: RE | Admit: 2020-04-18 | Discharge: 2020-04-18 | Disposition: A | Discharge status: Home / Self Care | Payer: Medicare Other | Source: Ambulatory Visit | Attending: Radiation Oncology | Admitting: Radiation Oncology

## 2020-04-18 ENCOUNTER — Inpatient Hospital Stay: Payer: Medicare Other

## 2020-04-18 VITALS — BP 153/84 | HR 58 | Temp 95.3°F | Resp 16 | Wt 248.5 lb

## 2020-04-18 DIAGNOSIS — R197 Diarrhea, unspecified: Secondary | ICD-10-CM | POA: Diagnosis not present

## 2020-04-18 DIAGNOSIS — C20 Malignant neoplasm of rectum: Secondary | ICD-10-CM | POA: Diagnosis not present

## 2020-04-18 DIAGNOSIS — Z923 Personal history of irradiation: Secondary | ICD-10-CM | POA: Insufficient documentation

## 2020-04-18 DIAGNOSIS — Z9221 Personal history of antineoplastic chemotherapy: Secondary | ICD-10-CM | POA: Insufficient documentation

## 2020-04-18 NOTE — Progress Notes (Signed)
Radiation Oncology Follow up Note  Name: Christopher Delacruz   Date:   04/18/2020 MRN:  [id number] DOB: September 30, 1950    This 70 y.o. male presents to the clinic today for 1 month follow-up status post concurrent chemoradiation therapy for stage III (T4b N2 M0) adenocarcinoma the rectum.  REFERRING PROVIDER: [name], MD  HPI: Patient is a 70 year old former worker at [employer] who is now 1 month out from chemoradiation for stage III adenocarcinoma the rectum.  Seen today in routine follow-up he still having some problems with diarrhea although it is much improved he is having no lower urinary tract symptoms.  He tentatively is pending surgical resection and that has been scheduled..  His p.o. intake is good weight is stable.  COMPLICATIONS OF TREATMENT: none  FOLLOW UP COMPLIANCE: keeps appointments   PHYSICAL EXAM:  BP (!) 153/84   Pulse (!) 58   Temp (!) 95.3 F (35.2 C) (Tympanic)   Resp 16   Wt 248 lb 8 oz (112.7 kg)   BMI 31.06 kg/m  Well-developed well-nourished patient in NAD. HEENT reveals PERLA, EOMI, discs not visualized.  Oral cavity is clear. No oral mucosal lesions are identified. Neck is clear without evidence of cervical or supraclavicular adenopathy. Lungs are clear to A&P. Cardiac examination is essentially unremarkable with regular rate and rhythm without murmur rub or thrill. Abdomen is benign with no organomegaly or masses noted. Motor sensory and DTR levels are equal and symmetric in the upper and lower extremities. Cranial nerves II through XII are grossly intact. Proprioception is intact. No peripheral adenopathy or edema is identified. No motor or sensory levels are noted. Crude visual fields are within normal range.  RADIOLOGY RESULTS: No current films for review  PLAN: Present time patient is improving still some slight intermittent diarrhea.  He is scheduled to meet with surgical team for consideration of resection of his rectal tumor.  I have asked to see him back in  2 months for follow-up at which time I can review his pathology.  Patient knows to call with any concerns.  I would like to take this opportunity to thank you for allowing me to participate in the care of your patient.[name], MD

## 2020-04-23 ENCOUNTER — Ambulatory Visit: Payer: Self-pay | Admitting: Surgery

## 2020-04-23 NOTE — H&P (Signed)
Christopher Delacruz Appointment: 04/23/2020 3:45 PM Location: [facility] Patient #: [id number] DOB: [birth year] Divorced / Language: Cleophus Molt / Race: Black or African American Male  History of Present Illness [name] MD; 04/23/2020 5:24 PM) The patient is a 70 year old male who presents with colorectal cancer. Note for "Colorectal cancer": ` ` ` Patient sent for surgical consultation at the request of [name], [place]  Chief Complaint: rectal cancer   03/16/2020. Finished concurrent chemotherapy-Xeloda 1500 mg twice daily with radiation ` ` Patient comes today with his nephew that helps take care of him. He is feeling better. He did have some diarrhea with some urge incontinence around the time making radiation therapy. He hasn't had any accidents in a while. Still wearing diapers just in case but in the bathroom just fine. Going about twice a day. No other fiber supplement. Eating well. Denies any fevers chills. No prone eating. He was cleared by cardiology. Okay to hold his Plavix. Plan to do surgery 10 weeks from completion of chemoradiation therapy = late April. Patient denies pain. Appetite good. In good spirits. Patient's nephew feels like the patient is doing rather well. Patient still with some memory issues but no hemiparesis. In good spirits. Staying rather active. Not needing a wheelchair or walker at this point  PRIOR NOTE [place] 2021: The patient is a patient with numerous comorbidities. History of stroke with evidence of infarcts of left basal ganglion 2017. Had some right-sided hemiparesis seems to have mostly resolved. However he has chronic cognitive impairment. Patient used to live by himself but now he is at assisted living at the [facility] in [place]. Nephew is involved & has healthcare power of attorney. Nephew here with patient. sounds like the patient had some issues with fall or other concerns and was fully anticoagulated on  Plavix. Then admitted August 2021 for significant iron deficiency anemia. Also some abdominal discomfort and diarrhea causing some occasional overflow incontinence. Refer to gastroenterology. Patient underwent colonoscopy and found to have bulky mass 1020 centimeters from anal verge. 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 Port has been placed and patient started on FOLFOX chemotherapy. I believe believe the plan was for 8 cycles. Last note last week talks about getting cycle #7 going. patient denies any incontinence now. Does not smoke. He can walk 20 minutes without difficulty. Used to work in [employer] in environmental services. Claims to move his bowels a couple times a day now. Does have diabetes that is off all medications at this time. No sleep apnea. He's been on a blood thinner intermittently. Not certain if he is currently taking given the history of symptomatic anemia. Nephew pretty certain patient is back on Plavix. no prior abdominal surgery. Some history of BPH but no major urinary hesitancy or nocturia  (Review of systems as stated in this history (HPI) or in the review of systems. Otherwise all other 12 point ROS are negative) ` ` ###########################################`  This patient encounter took 65 minutes today to perform the following: obtain history, perform exam, review outside records, interpret tests & imaging, counsel the patient on their diagnosis; and, document this encounter, including findings & plan in the electronic health record (EHR).   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 (TAKE TWO TABLETS AT 2 PM, 3 PM, AND 10 PM THE DAY PRIOR TO SURGERY) amLODIPine Besylate (10MG  Tablet, Oral) Active. Clopidogrel Bisulfate (75MG  Tablet, Oral) Active. Baby Aspirin (81MG  Tablet Chewable, Oral) Active. Finasteride (5MG  Tablet, Oral) Active. FeroSul (325 (65 Fe)MG Tablet, Oral) Active. Nystatin (100000 UNIT/ML Suspension, Mouth/Throat) Active. One A Day Mens VitaCraves (Oral) Active. Vitamin B12 (1000MCG Tablet ER, Oral) Active. Ondansetron (4MG  Tablet Disint, Oral) Active. Prochlorperazine Maleate (10MG  Tablet, Oral) Active. Medications Reconciled  Social History [name], MD; 04/23/2020 4:11 PM) Alcohol use Occasional alcohol use. Caffeine use Tea. No drug use Tobacco use Former smoker. now in assisted living [facility]  now in assisted living [facility] [address], [place] i [phone]  Family History [name], MD; 04/23/2020 4:11 PM) Cancer Brother. Diabetes Mellitus Brother, Sister. Heart Disease Brother. Hypertension Brother.  Other Problems [name], MD; 04/23/2020 4:11 PM) Cerebrovascular Accident Diabetes Mellitus High blood pressure Hypercholesterolemia Rectal Cancer     Review of Systems [name], MD; 04/23/2020 4:12 PM) General Not Present- Appetite Loss, Chills, Fatigue, Fever, Night Sweats, Weight  Gain and Weight Loss. Skin Not Present- Change in Wart/Mole, Dryness, Hives, Jaundice, New Lesions, Non-Healing Wounds, Rash and Ulcer. HEENT Not Present- Earache, Hearing Loss, Hoarseness, Nose Bleed, Oral Ulcers, Ringing in the Ears, Seasonal Allergies, Sinus Pain, Sore Throat, Visual Disturbances, Wears glasses/contact lenses and Yellow Eyes. Breast Not Present- Breast Mass, Breast Pain, Nipple Discharge and Skin Changes. Cardiovascular Not Present- Chest Pain, Difficulty Breathing Lying Down, Leg Cramps, Palpitations, Rapid Heart Rate, Shortness of Breath and Swelling of Extremities. Gastrointestinal Not Present- Abdominal Pain, Bloating, Bloody Stool, Change in Bowel Habits, Chronic diarrhea, Constipation, Difficulty Swallowing, Excessive gas, Gets full quickly at meals, Hemorrhoids, Indigestion, Nausea, Rectal Pain and Vomiting. Male Genitourinary Not Present- Blood in Urine, Change in Urinary Stream, Frequency, Impotence, Nocturia, Painful Urination, Urgency and Urine Leakage. Musculoskeletal Not Present- Back Pain, Joint Pain, Joint Stiffness, Muscle Pain, Muscle Weakness and Swelling of Extremities. Neurological Present- Decreased Memory. Not Present- Fainting, Headaches, Numbness, Seizures, Tingling, Tremor, Trouble walking and Weakness. Psychiatric Present- Anxiety. Not Present- Bipolar, Change in Sleep Pattern, Depression, Fearful and Frequent crying. Endocrine Not Present- Cold Intolerance, Excessive Hunger, Hair Changes, Heat Intolerance and New Diabetes. Hematology Present- Blood Thinners. Not Present- Easy Bruising, Excessive bleeding, Gland problems, HIV and Persistent Infections.   Physical Exam [name] MD; 04/23/2020 4:31 PM)  General Mental Status-Alert. General Appearance-Not in acute distress, Not Sickly. Orientation-Oriented X3. Hydration-Well hydrated. Voice-Normal. Note: calm and relaxed. Sitting up smiling. No malnutrition or  cachexia  Integumentary Global Assessment Upon inspection and palpation of skin surfaces of the - Axillae: non-tender, no inflammation or ulceration, no drainage. and Distribution of scalp and body hair is normal. General Characteristics Temperature - normal warmth is noted.  Head and Neck Head-normocephalic, atraumatic with no lesions or palpable masses. Face Global Assessment - atraumatic, no absence of expression. Neck Global Assessment - no abnormal movements, no bruit  auscultated on the right, no bruit auscultated on the left, no decreased range of motion, non-tender. Trachea-midline. Thyroid Gland Characteristics - non-tender.  Eye Eyeball - Left-Extraocular movements intact, No Nystagmus - Left. Eyeball - Right-Extraocular movements intact, No Nystagmus - Right. Cornea - Left-No Hazy - Left. Cornea - Right-No Hazy - Right. Sclera/Conjunctiva - Left-No scleral icterus, No Discharge - Left. Sclera/Conjunctiva - Right-No scleral icterus, No Discharge - Right. Pupil - Left-Direct reaction to light normal. Pupil - Right-Direct reaction to light normal.  ENMT Ears Pinna - Left - no drainage observed, no generalized tenderness observed. Pinna - Right - no drainage observed, no generalized tenderness observed. Nose and Sinuses External Inspection of the Nose - no destructive lesion observed. Inspection of the nares - Left - quiet respiration. Inspection of the nares - Right - quiet respiration. Mouth and Throat Lips - Upper Lip - no fissures observed, no pallor noted. Lower Lip - no fissures observed, no pallor noted. Nasopharynx - no discharge present. Oral Cavity/Oropharynx - Tongue - no dryness observed. Oral Mucosa - no cyanosis observed. Hypopharynx - no evidence of airway distress observed.  Chest and Lung Exam Inspection Movements - Normal and Symmetrical. Accessory muscles - No use of accessory muscles in breathing. Palpation Palpation of the chest  reveals - Non-tender. Auscultation Breath sounds - Normal and Clear.  Cardiovascular Auscultation Rhythm - Regular. Murmurs & Other Heart Sounds - Auscultation of the heart reveals - No Murmurs and No Systolic Clicks.  Abdomen Inspection Inspection of the abdomen reveals - No Visible peristalsis and No Abnormal pulsations. Umbilicus - No Bleeding, No Urine drainage. Palpation/Percussion Palpation and Percussion of the abdomen reveal - Soft, Non Tender, No Rebound tenderness, No Rigidity (guarding) and No Cutaneous hyperesthesia. Note: Abdomen soft. Nontender. Not distended. No umbilical or incisional hernias. No guarding.  Male Genitourinary Sexual Maturity Tanner 5 - Adult hair pattern and Adult penile size and shape. Note: No inguinal hernias. Normal external genitalia. Epididymi, testes, and spermatic cords normal without any masses.  Rectal Note: Held off on digital exam since had normal sphincter tone and has improvement in his continence  Peripheral Vascular Upper Extremity Inspection - Left - No Cyanotic nailbeds - Left, Not Ischemic. Inspection - Right - No Cyanotic nailbeds - Right, Not Ischemic.  Neurologic Neurologic evaluation reveals -normal attention span and ability to concentrate, able to name objects and repeat phrases. Appropriate fund of knowledge , normal sensation and normal coordination. Mental Status Affect - not angry, not paranoid. Cranial Nerves-Normal Bilaterally. Gait-Normal.  Neuropsychiatric Mental status exam performed with findings of-able to articulate well with normal speech/language, rate, volume and coherence, thought content normal with ability to perform basic computations and apply abstract reasoning and no evidence of hallucinations, delusions, obsessions or homicidal/suicidal ideation. Note: poor memory recall. follows most commands been can get easily confused  Musculoskeletal Global Assessment Spine, Ribs and Pelvis  - no instability, subluxation or laxity. Right Upper Extremity - no instability, subluxation or laxity.  Lymphatic Head & Neck General Head & Neck Lymphatics: Bilateral - Description - No Localized lymphadenopathy. Axillary General Axillary Region: Bilateral - Description - No Localized lymphadenopathy. Femoral & Inguinal Generalized Femoral & Inguinal Lymphatics: Left - Description - No Localized lymphadenopathy. Right - Description - No Localized lymphadenopathy.    Assessment & Plan [name] MD; 04/23/2020 5:24 PM)  RECTAL ADENOCARCINOMA (C20) Story: bulky tumor involving at least proximal possible mid rectum  03/16/2020. Finished concurrent chemotherapy-Xeloda 1500 mg twice daily with radiation Impression: Bulking proximal  rectal cancer with improved symptoms after chemoradiation therapy.  Plan robotic rectosigmoid on anterior resection 10 weeks after completion and chemotherapy. Date sent May 25, 2020.  Preoperative cystoscopy and firefly marking for the ureters.  Preoperative ostomy marking in the case diverting loop ileostomy versus permanent ostomy needed. Hopefully this is proximal enough that we can avoid any ostomy since he did not get total adjuvant. We will see. Patient would really like to avoid a permanent ostomy if possible.  Patient may require skilled facility postoperatively depending. Risk of dehydration higher and a demented patient with an ileostomy, so need to follow closely with a good bowel regimen. Hopefully ostomy clinic will be working well so we can send the patient.  Cardiac clearance done. Okay to hold Plavix 5d preoperatively.  Current Plans Pt Education - [facility] Colorectal Cancer (AT): discussed with patient and provided information. The anatomy & physiology of the digestive tract was discussed. The pathophysiology of the rectal pathology was discussed. Natural history risks without surgery was discussed. I worked to give an overview of the  disease and the frequent need to have multispecialty involvement. I feel the risks of no intervention will lead to serious problems that outweigh the operative risks; therefore, I recommended a partial proctocolectomy to remove the pathology. Minimally Invasive (Robotic/Laparoscopic) & open techniques were discussed. We will work to preserve anal & pelvic floor function without sacrificing cure.  Risks such as bleeding, infection, abscess, leak, reoperation, possible temporary or permanent ostomy, hernia, heart attack, death, and other risks were discussed. I noted a good likelihood this will help address the problem. Goals of post-operative recovery were discussed as well. We will work to minimize complications. Educational information was available as well. Questions were answered. The patient expresses understanding & wishes to proceed with surgery.   PREOP COLON - ENCOUNTER FOR PREOPERATIVE EXAMINATION FOR GENERAL SURGICAL PROCEDURE (Z01.818)  Current Plans You are being scheduled for surgery- Our schedulers will call you.  You should hear from our office's scheduling department within 5 working days about the location, date, and time of surgery. We try to make accommodations for patient's preferences in scheduling surgery, but sometimes the OR schedule or the surgeon's schedule prevents [place] from making those accommodations.  If you have not heard from our office [phone]) in 5 working days, call the office and ask for your surgeon's nurse.  If you have other questions about your diagnosis, plan, or surgery, call the office and ask for your surgeon's nurse.  Written instructions provided Pt Education - [facility] Colon Bowel Prep 2018 ERAS/Miralax/Antibiotics Continued Neomycin Sulfate 500 MG Oral Tablet, 2 (two) Tablet SEE NOTE, #6, 04/23/2020, No Refill. Local Order: Pharmacist Notes: TAKE TWO TABLETS AT 2 PM, 3 PM, AND 10 PM THE DAY PRIOR TO SURGERY Restarted metroNIDAZOLE 500  MG Oral Tablet, 2 (two) Tablet three times daily, #6, 1 day starting 04/23/2020, No Refill. Local Order: Pharmacist Notes: Pharmacy Instructions: Take 2 tablets at 2pm, 3pm, and 10pm the day prior to your colon operation.  ANTICOAGULATED (Z79.01) Impression: anesthesia coagulated on Plavix skin history of stroke infarct 2017. Was recently held due to symptomatically anemia from his rectal cancer.  The patient seems to have pretty good performance status but I think it would be wise to get cardiac clearance to assess operative risks  Current Plans Pt Education - [facility] Hold anticoagulation preoperatively  HISTORY OF STROKE (Z86.73)   COGNITIVE IMPAIRMENT (R41.89) Impression: history of significant cognitive impairment and poor memory recall most likely related to  his stroke.  now in assisted living [facility] [place], [place] i [phone]   Alanson Puls has become healthcare power of attorney.RESPONSIBLE PARTY:Mark Cadieux, nephew/HCPOA [phone]   FUNCTIONAL FECAL INCONTINENCE (R15.9) Impression: episodes of fecal incontinence that seem to be related to his urgency diarrhea from his partially obstructing rectosigmoid cancer   STAGE 3A CHRONIC KIDNEY DISEASE (N18.31)   DM (DIABETES MELLITUS), TYPE 2 WITH COMPLICATIONS (Z02.5)    [name], MD, FACS, MASCRS  Gastrointestinal and Minimally Invasive Surgery  [facility] [address] [place], [place] [address] [phone] Fax [phone] Main/Paging  CONTACT INFORMATION: Weekday (9AM-5PM) concerns: Call [facility] main office at [phone] Weeknight (5PM-9AM) or Weekend/Holiday concerns: Check [email or web address] for General Surgery [facility] coverage (Please, do not use SecureChat as it is not reliable communication to operating surgeons for immediate patient care)

## 2020-04-23 NOTE — H&P (Signed)
Zoe Lan Appointment: 04/23/2020 3:45 PM Location: [facility] Patient #: [id number] DOB: 1950-10-14 Divorced / Language: Cleophus Molt / Race: Black or African American Male  History of Present Illness [name] MD; 04/23/2020 4:35 PM) The patient is a 70 year old male who presents with colorectal cancer. Note for "Colorectal cancer": ` ` ` Patient sent for surgical consultation at the request of [name], [place]  Chief Complaint: rectal cancer   03/16/2020. Finished concurrent chemotherapy-Xeloda 1500 mg twice daily with radiation ` ` Patient comes today with his nephew helps take care of him. He is feeling better. He did have some diarrhea with some urge incontinence around the time making radiation therapy. He hasn't had any accidents in a while. Still wearing diapers just in case but in the bathroom just fine. Going about twice a day. No other fiber supplement. Eating well. Denies any fevers chills. No prone eating. He was cleared by cardiology. Okay to hold his Plavix. Plan to do surgery 10 weeks from completion of chemoradiation therapy = late April. Patient denies pain. Appetite good. In good spirits. Patient's nephew feels like the patient is doing rather well. Patient still with some memory issues but no hemiparesis. In good spirits. Staying rather active. Not needing a wheelchair or walker at this point  PRIOR NOTE [place] 2021: The patient is a patient with numerous comorbidities. History of stroke with evidence of infarcts of left basal ganglion 2017. Had some right-sided hemiparesis seems to have mostly resolved. However he has chronic cognitive impairment. Patient used to live by himself but now he is at assisted living at the [facility] in [place]. Nephew is involved & has healthcare power of attorney. Nephew here with patient. sounds like the patient had some issues with fall or other concerns and was fully anticoagulated on  Plavix. Then admitted August 2021 for significant iron deficiency anemia. Also some abdominal discomfort and diarrhea causing some occasional overflow incontinence. Refer to gastroenterology. Patient underwent colonoscopy and found to have bulky mass 1020 centimeters from anal verge. 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 Port has been placed and patient started on FOLFOX chemotherapy. I believe believe the plan was for 8 cycles. Last note last week talks about getting cycle #7 going. patient denies any incontinence now. Does not smoke. He can walk 20 minutes without difficulty. Used to work in [employer] in environmental services. Claims to move his bowels a couple times a day now. Does have diabetes that is off all medications at this time. No sleep apnea. He's been on a blood thinner intermittently. Not certain if he is currently taking given the history of symptomatic anemia. Nephew pretty certain patient is back on Plavix. no prior abdominal surgery. Some history of BPH but no major urinary hesitancy or nocturia  (Review of systems as stated in this history (HPI) or in the review of systems. Otherwise all other 12 point ROS are negative) ` ` ###########################################`  This patient encounter took 65 minutes today to perform the following: obtain history, perform exam, review outside records, interpret tests & imaging, counsel the patient on their diagnosis; and, document this encounter, including findings & plan in the electronic health record (EHR).   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 (TAKE TWO TABLETS AT 2 PM, 3 PM, AND 10 PM THE DAY PRIOR TO SURGERY) amLODIPine Besylate (10MG  Tablet, Oral) Active. Clopidogrel Bisulfate (75MG  Tablet, Oral) Active. Baby Aspirin (81MG  Tablet Chewable, Oral) Active. Finasteride (5MG  Tablet, Oral) Active. FeroSul (325 (65 Fe)MG Tablet, Oral) Active. Nystatin (100000 UNIT/ML Suspension, Mouth/Throat) Active. One A Day Mens VitaCraves (Oral) Active. Vitamin B12 (1000MCG Tablet ER, Oral) Active. Ondansetron (4MG  Tablet Disint, Oral) Active. Prochlorperazine Maleate (10MG  Tablet, Oral) Active. Medications Reconciled  Social History [name], MD; 04/23/2020 4:11 PM) Alcohol use Occasional alcohol use. Caffeine use Tea. No drug use Tobacco use Former smoker. now in assisted living [facility]  now in assisted living [facility] [address] [place] [phone]  Family History [name], MD; 04/23/2020 4:11 PM) Cancer Brother. Diabetes Mellitus Brother, Sister. Heart Disease Brother. Hypertension Brother.  Other Problems [name], MD; 04/23/2020 4:11 PM) Cerebrovascular Accident Diabetes Mellitus High blood pressure Hypercholesterolemia Rectal Cancer     Review of Systems [name], MD; 04/23/2020 4:12 PM) General Not Present- Appetite Loss, Chills, Fatigue, Fever, Night Sweats, Weight  Gain and Weight Loss. Skin Not Present- Change in Wart/Mole, Dryness, Hives, Jaundice, New Lesions, Non-Healing Wounds, Rash and Ulcer. HEENT Not Present- Earache, Hearing Loss, Hoarseness, Nose Bleed, Oral Ulcers, Ringing in the Ears, Seasonal Allergies, Sinus Pain, Sore Throat, Visual Disturbances, Wears glasses/contact lenses and Yellow Eyes. Breast Not Present- Breast Mass, Breast Pain, Nipple Discharge and Skin Changes. Cardiovascular Not Present- Chest Pain, Difficulty Breathing Lying Down, Leg Cramps, Palpitations, Rapid Heart Rate, Shortness of Breath and Swelling of Extremities. Gastrointestinal Not Present- Abdominal Pain, Bloating, Bloody Stool, Change in Bowel Habits, Chronic diarrhea, Constipation, Difficulty Swallowing, Excessive gas, Gets full quickly at meals, Hemorrhoids, Indigestion, Nausea, Rectal Pain and Vomiting. Male Genitourinary Not Present- Blood in Urine, Change in Urinary Stream, Frequency, Impotence, Nocturia, Painful Urination, Urgency and Urine Leakage. Musculoskeletal Not Present- Back Pain, Joint Pain, Joint Stiffness, Muscle Pain, Muscle Weakness and Swelling of Extremities. Neurological Present- Decreased Memory. Not Present- Fainting, Headaches, Numbness, Seizures, Tingling, Tremor, Trouble walking and Weakness. Psychiatric Present- Anxiety. Not Present- Bipolar, Change in Sleep Pattern, Depression, Fearful and Frequent crying. Endocrine Not Present- Cold Intolerance, Excessive Hunger, Hair Changes, Heat Intolerance and New Diabetes. Hematology Present- Blood Thinners. Not Present- Easy Bruising, Excessive bleeding, Gland problems, HIV and Persistent Infections.   Physical Exam [name] MD; 04/23/2020 4:31 PM)  General Mental Status-Alert. General Appearance-Not in acute distress, Not Sickly. Orientation-Oriented X3. Hydration-Well hydrated. Voice-Normal. Note: calm and relaxed. Sitting up smiling. No malnutrition or  cachexia  Integumentary Global Assessment Upon inspection and palpation of skin surfaces of the - Axillae: non-tender, no inflammation or ulceration, no drainage. and Distribution of scalp and body hair is normal. General Characteristics Temperature - normal warmth is noted.  Head and Neck Head-normocephalic, atraumatic with no lesions or palpable masses. Face Global Assessment - atraumatic, no absence of expression. Neck Global Assessment - no abnormal movements, no bruit auscultated  on the right, no bruit auscultated on the left, no decreased range of motion, non-tender. Trachea-midline. Thyroid Gland Characteristics - non-tender.  Eye Eyeball - Left-Extraocular movements intact, No Nystagmus - Left. Eyeball - Right-Extraocular movements intact, No Nystagmus - Right. Cornea - Left-No Hazy - Left. Cornea - Right-No Hazy - Right. Sclera/Conjunctiva - Left-No scleral icterus, No Discharge - Left. Sclera/Conjunctiva - Right-No scleral icterus, No Discharge - Right. Pupil - Left-Direct reaction to light normal. Pupil - Right-Direct reaction to light normal.  ENMT Ears Pinna - Left - no drainage observed, no generalized tenderness observed. Pinna - Right - no drainage observed, no generalized tenderness observed. Nose and Sinuses External Inspection of the Nose - no destructive lesion observed. Inspection of the nares - Left - quiet respiration. Inspection of the nares - Right - quiet respiration. Mouth and Throat Lips - Upper Lip - no fissures observed, no pallor noted. Lower Lip - no fissures observed, no pallor noted. Nasopharynx - no discharge present. Oral Cavity/Oropharynx - Tongue - no dryness observed. Oral Mucosa - no cyanosis observed. Hypopharynx - no evidence of airway distress observed.  Chest and Lung Exam Inspection Movements - Normal and Symmetrical. Accessory muscles - No use of accessory muscles in breathing. Palpation Palpation of the chest  reveals - Non-tender. Auscultation Breath sounds - Normal and Clear.  Cardiovascular Auscultation Rhythm - Regular. Murmurs & Other Heart Sounds - Auscultation of the heart reveals - No Murmurs and No Systolic Clicks.  Abdomen Inspection Inspection of the abdomen reveals - No Visible peristalsis and No Abnormal pulsations. Umbilicus - No Bleeding, No Urine drainage. Palpation/Percussion Palpation and Percussion of the abdomen reveal - Soft, Non Tender, No Rebound tenderness, No Rigidity (guarding) and No Cutaneous hyperesthesia. Note: Abdomen soft. Nontender. Not distended. No umbilical or incisional hernias. No guarding.  Male Genitourinary Sexual Maturity Tanner 5 - Adult hair pattern and Adult penile size and shape. Note: No inguinal hernias. Normal external genitalia. Epididymi, testes, and spermatic cords normal without any masses.  Rectal Note: Held off on digital exam since had normal sphincter tone and has improvement in his continence  Peripheral Vascular Upper Extremity Inspection - Left - No Cyanotic nailbeds - Left, Not Ischemic. Inspection - Right - No Cyanotic nailbeds - Right, Not Ischemic.  Neurologic Neurologic evaluation reveals -normal attention span and ability to concentrate, able to name objects and repeat phrases. Appropriate fund of knowledge , normal sensation and normal coordination. Mental Status Affect - not angry, not paranoid. Cranial Nerves-Normal Bilaterally. Gait-Normal.  Neuropsychiatric Mental status exam performed with findings of-able to articulate well with normal speech/language, rate, volume and coherence, thought content normal with ability to perform basic computations and apply abstract reasoning and no evidence of hallucinations, delusions, obsessions or homicidal/suicidal ideation. Note: poor memory recall. follows most commands been can get easily confused  Musculoskeletal Global Assessment Spine, Ribs and Pelvis  - no instability, subluxation or laxity. Right Upper Extremity - no instability, subluxation or laxity.  Lymphatic Head & Neck General Head & Neck Lymphatics: Bilateral - Description - No Localized lymphadenopathy. Axillary General Axillary Region: Bilateral - Description - No Localized lymphadenopathy. Femoral & Inguinal Generalized Femoral & Inguinal Lymphatics: Left - Description - No Localized lymphadenopathy. Right - Description - No Localized lymphadenopathy.    Assessment & Plan [name] MD; 04/23/2020 4:31 PM)  RECTAL ADENOCARCINOMA (C20) Story: bulky tumor involving at least proximal possible mid rectum  03/16/2020. Finished concurrent chemotherapy-Xeloda 1500 mg twice daily with radiation Impression: Bulking proximal rectal  cancer with improved symptoms after chemoradiation therapy.  Plan robotic rectosigmoid on anterior resection 10 weeks after completion and chemotherapy. Date sent May 25, 2020.  Preoperative cystoscopy and firefly marking for the ureters.  Preoperative ostomy marking in the case diverting loop ileostomy versus permanent ostomy needed. Hopefully this is proximal enough that we can avoid any ostomy since he did not get total adjuvant. We will see.  Patient may require skilled facility postoperatively depending. Risk of dehydration higher and a demented patient with an ileostomy, so need to follow closely with a good bowel regimen. Hopefully ostomy clinic will be working well so we can send the patient.  Cardiac clearance done. Okay to hold Plavix 5d preoperatively.  Current Plans Pt Education - [facility] Colorectal Cancer (AT): discussed with patient and provided information. The anatomy & physiology of the digestive tract was discussed. The pathophysiology of the rectal pathology was discussed. Natural history risks without surgery was discussed. I worked to give an overview of the disease and the frequent need to have multispecialty involvement. I  feel the risks of no intervention will lead to serious problems that outweigh the operative risks; therefore, I recommended a partial proctocolectomy to remove the pathology. Minimally Invasive (Robotic/Laparoscopic) & open techniques were discussed. We will work to preserve anal & pelvic floor function without sacrificing cure.  Risks such as bleeding, infection, abscess, leak, reoperation, possible temporary or permanent ostomy, hernia, heart attack, death, and other risks were discussed. I noted a good likelihood this will help address the problem. Goals of post-operative recovery were discussed as well. We will work to minimize complications. Educational information was available as well. Questions were answered. The patient expresses understanding & wishes to proceed with surgery.   PREOP COLON - ENCOUNTER FOR PREOPERATIVE EXAMINATION FOR GENERAL SURGICAL PROCEDURE (Z01.818)  Current Plans You are being scheduled for surgery- Our schedulers will call you.  You should hear from our office's scheduling department within 5 working days about the location, date, and time of surgery. We try to make accommodations for patient's preferences in scheduling surgery, but sometimes the OR schedule or the surgeon's schedule prevents [place] from making those accommodations.  If you have not heard from our office [phone]) in 5 working days, call the office and ask for your surgeon's nurse.  If you have other questions about your diagnosis, plan, or surgery, call the office and ask for your surgeon's nurse.  Written instructions provided Pt Education - [facility] Colon Bowel Prep 2018 ERAS/Miralax/Antibiotics Continued Neomycin Sulfate 500 MG Oral Tablet, 2 (two) Tablet SEE NOTE, #6, 04/23/2020, No Refill. Local Order: Pharmacist Notes: TAKE TWO TABLETS AT 2 PM, 3 PM, AND 10 PM THE DAY PRIOR TO SURGERY Restarted metroNIDAZOLE 500 MG Oral Tablet, 2 (two) Tablet three times daily, #6, 1 day starting  04/23/2020, No Refill. Local Order: Pharmacist Notes: Pharmacy Instructions: Take 2 tablets at 2pm, 3pm, and 10pm the day prior to your colon operation.  ANTICOAGULATED (Z79.01) Impression: anesthesia coagulated on Plavix skin history of stroke infarct 2017. Was recently held due to symptomatically anemia from his rectal cancer.  The patient seems to have pretty good performance status but I think it would be wise to get cardiac clearance to assess operative risks  Current Plans Pt Education - [facility] Hold anticoagulation preoperatively  HISTORY OF STROKE (Z86.73)   COGNITIVE IMPAIRMENT (R41.89) Impression: history of significant cognitive impairment and poor memory recall most likely related to his stroke.  now in assisted living [facility]  Barbara Cower, [place] [phone]   Alanson Puls has become healthcare power of attorney.RESPONSIBLE PARTY:Mark Kretschmer, nephew/HCPOA [phone]   FUNCTIONAL FECAL INCONTINENCE (R15.9) Impression: episodes of fecal incontinence that seem to be related to his urgency diarrhea from his partially obstructing rectosigmoid cancer   STAGE 3A CHRONIC KIDNEY DISEASE (N18.31)   DM (DIABETES MELLITUS), TYPE 2 WITH COMPLICATIONS (Y70.6)   IRON DEFICIENCY ANEMIA DUE TO CHRONIC BLOOD LOSS (D50.0)  [name], MD, FACS, MASCRS  Gastrointestinal and Minimally Invasive Surgery  [facility] [address], [place], [place] [address] [phone] Fax [phone] Main/Paging  CONTACT INFORMATION: Weekday (9AM-5PM) concerns: Call [facility] main office at [phone] Weeknight (5PM-9AM) or Weekend/Holiday concerns: Check [email or web address] for General Surgery [facility] coverage (Please, do not use SecureChat as it is not reliable communication to operating surgeons for immediate patient care)

## 2020-04-27 ENCOUNTER — Other Ambulatory Visit (HOSPITAL_COMMUNITY): Payer: Self-pay

## 2020-04-27 ENCOUNTER — Other Ambulatory Visit: Payer: Self-pay

## 2020-04-27 ENCOUNTER — Non-Acute Institutional Stay: Payer: Medicare Other | Admitting: Adult Health Nurse Practitioner

## 2020-04-27 DIAGNOSIS — Z515 Encounter for palliative care: Secondary | ICD-10-CM

## 2020-04-27 DIAGNOSIS — F0150 Vascular dementia without behavioral disturbance: Secondary | ICD-10-CM

## 2020-04-27 DIAGNOSIS — C19 Malignant neoplasm of rectosigmoid junction: Secondary | ICD-10-CM

## 2020-04-27 NOTE — Progress Notes (Signed)
[facility] Consult Note Telephone: [phone]  Fax: [phone]  PATIENT NAME: Christopher Delacruz DOB: March 27, 1950 MRN: [id number]  PRIMARY CARE PROVIDER:   [name], MD  REFERRING PROVIDER:  [name], MD [address] [place],  [place] [address]  RESPONSIBLE PARTY:   Dawit Tankard, nephew/HCPOA [phone]  Chief complaint: follow up palliative visit/colorectal cancer   RECOMMENDATIONS and PLAN:  1.  Advanced care planning. Patient is full code.  Spoke with nephew via phone to update on today's visit  2.  Colorectal cancer.  Patient has finished chemo and radiation and is scheduled to have surgery for colostomy formation on 05/25/2020.  Son did have questions about colostomy being managed at ALF.  Confirmed with social workers that this is possible and if needed we could bring in home health services.  Continue follow-up and recommendations by oncology  3.  Dementia.  This is stable with no changes in functional status.  Patient ambulates independently without assistive devices.  Is able to perform ADLs independently though does require cueing.  Is continent of bowel and bladder but has occasional accidents.  Appetite is good with current weight at 237 pounds with BMI of 29.62.  Continue supportive care at the facility.  Palliative will continue to monitor for symptom management/decline and make recommendations as needed.  Follow-up in 8 to 10 weeks.  Nephew encouraged to call with any questions or concerns  HISTORY OF PRESENT ILLNESS:  Christopher Delacruz is a 70 y.o. year old male with multiple medical problems including colorectal cancer, CKD stage 3, anemia,vascular dementia, h/o CVA with right side weakness. Palliative Care was asked to help address goals of care.  Patient has no new concerns today and keeps stating that he is fine.  HPI/ROS unreliable secondary to dementia.  Staff and son have no new concerns.  Patient has not had any falls,  infections, hospital visits since last visit.  Son does state that patient keeps asking when he can get a place of his own.  He keeps reminding him that this is his home.  Provider keeps reminding him of this as well.  CODE STATUS: DNR  PPS: 50% HOSPICE ELIGIBILITY/DIAGNOSIS: TBD  PHYSICAL EXAM:  HR 61  O2 98% on RA General: patient sitting in chair in NAD Eyes: sclera anicteric and noninjected with no discharge noted ENMT: Moist mucous membranes Cardiovascular: regular rate and rhythm Pulmonary:lung sounds clear; normal respiratory effort Abdomen: soft, nontender, + bowel sounds Extremities: no edema, no joint deformities Skin: no rasheson exposed skin Neurological: Weakness; A&O to person and place   PAST MEDICAL HISTORY:  Past Medical History:  Diagnosis Date  . Acute renal failure superimposed on stage 3a chronic kidney disease ([place]) 09/07/2019  . B12 deficiency 09/17/2019  . Cancer ([place])   . CVA (cerebral vascular accident) ([place])   . Diabetes mellitus ([place])   . Hyperlipidemia   . Hypertension   . Right hemiparesis ([place]) 2017   Associated with stroke    SOCIAL HX:  Social History   Tobacco Use  . Smoking status: Never Smoker  . Smokeless tobacco: Never Used  Substance Use Topics  . Alcohol use: Never    Alcohol/week: 0.0 standard drinks    ALLERGIES: No Known Allergies   PERTINENT MEDICATIONS:  Outpatient Encounter Medications as of 04/27/2020  Medication Sig  . amLODipine (NORVASC) 10 MG tablet Take 1 tablet (10 mg total) by mouth daily.  Marland Kitchen aspirin EC 81 MG EC tablet Take 1 tablet (  81 mg total) by mouth daily. Swallow whole.  . capecitabine (XELODA) 500 MG tablet Take 3 tablets (1,500 mg total) by mouth 2 (two) times daily after a meal. Take Monday through Friday during radiation.  . clopidogrel (PLAVIX) 75 MG tablet Take 1 tablet (75 mg total) by mouth daily.  . ferrous sulfate 325 (65 FE) MG tablet Take 1 tablet (325 mg total) by mouth daily.  . finasteride  (PROSCAR) 5 MG tablet Take 5 mg by mouth daily.  . multivitamin (ONE-A-DAY MEN'S) TABS tablet Take 1 tablet by mouth daily.  Marland Kitchen nystatin (MYCOSTATIN) 100000 UNIT/ML suspension Take 5 mLs by mouth 4 (four) times daily. Swish and spit  Reported by [place] (Patient not taking: No sig reported)  . ondansetron (ZOFRAN) 8 MG tablet Take 1 tablet (8 mg total) by mouth 2 (two) times daily as needed for refractory nausea / vomiting. Start on day 3 after chemotherapy.  . prochlorperazine (COMPAZINE) 10 MG tablet Take 1 tablet (10 mg total) by mouth every 6 (six) hours as needed (Nausea or vomiting).  . vitamin B-12 (CYANOCOBALAMIN) 1000 MCG tablet Take 1,000 mcg by mouth daily.   Facility-Administered Encounter Medications as of 04/27/2020  Medication  . sodium chloride flush (NS) 0.9 % injection 10 mL      Ivie Maese Jenetta Downer, NP

## 2020-05-03 ENCOUNTER — Encounter: Payer: Self-pay | Admitting: Internal Medicine

## 2020-05-03 ENCOUNTER — Other Ambulatory Visit: Payer: Self-pay

## 2020-05-03 ENCOUNTER — Ambulatory Visit (INDEPENDENT_AMBULATORY_CARE_PROVIDER_SITE_OTHER): Payer: Medicare Other | Admitting: Internal Medicine

## 2020-05-03 VITALS — BP 140/60 | HR 60 | Ht 75.0 in | Wt 246.0 lb

## 2020-05-03 DIAGNOSIS — I152 Hypertension secondary to endocrine disorders: Secondary | ICD-10-CM

## 2020-05-03 DIAGNOSIS — Z8673 Personal history of transient ischemic attack (TIA), and cerebral infarction without residual deficits: Secondary | ICD-10-CM | POA: Diagnosis not present

## 2020-05-03 DIAGNOSIS — I700 Atherosclerosis of aorta: Secondary | ICD-10-CM | POA: Insufficient documentation

## 2020-05-03 DIAGNOSIS — E1159 Type 2 diabetes mellitus with other circulatory complications: Secondary | ICD-10-CM

## 2020-05-03 DIAGNOSIS — I351 Nonrheumatic aortic (valve) insufficiency: Secondary | ICD-10-CM | POA: Diagnosis not present

## 2020-05-03 NOTE — Progress Notes (Signed)
Cardiology Office Note:    Date:  05/03/2020   ID:  Christopher Delacruz, DOB 09-May-1950, MRN [id number]  PCP:  [name], MD  [facility] Cardiologist: Rudean Haskell MD [place] Electrophysiologist:  None   CC: Post surgery follow up  History of Present Illness:    Christopher Delacruz is a 70 y.o. male with a hx of DM with HTN, Aortic Atherosclerosis, Coronary Artery Calcification, HLD, Prior CVA with prior hemiparesis, and CKD Stage IIIa, Mild Aortic Insufficiency who presented for evaluation 01/30/20.  In interim of this visit, patient has surgery scheduled.  Completed radiation and chemotherapy.  Oncological History notable for: Malignancies: Colorectal Adenocarcinoma, [id number] Surgery: 09/13/19 Polyp Resection; pending Surgery (pushed back to 05/26/19) Chemotherapy: FOLFOX, capecitabine, palonosetron 2021 (completed) Cessations for Toxicity: Oxaliplatin reduced for kidney function Radiation: none in the field of view of the heart (radiation completed) Oncology care spearheaded by: Dr. Tasia Catchings  Patient notes that he is doing well.  Since last visit notes that he feels good.   There are no interval hospital/ED visit.    No chest pain or pressure .  No SOB, but [place] with long distance walkes and no PND/Orthopnea.  No weight gain and improved leg swelling.  No palpitations or syncope.  Ambulatory blood pressure 140/60 (Sees the [place] [facility] [place]).    Past Medical History:  Diagnosis Date  . Acute renal failure superimposed on stage 3a chronic kidney disease ([place]) 09/07/2019  . B12 deficiency 09/17/2019  . Cancer ([place])   . CVA (cerebral vascular accident) ([place])   . Diabetes mellitus ([place])   . Hyperlipidemia   . Hypertension   . Right hemiparesis ([place]) 2017   Associated with stroke    Past Surgical History:  Procedure Laterality Date  . COLONOSCOPY N/A 09/11/2019   Procedure: COLONOSCOPY;  Surgeon: [name], MD;  [facility] ENDOSCOPY;  Service: Endoscopy;  Laterality: N/A;   . COLONOSCOPY WITH PROPOFOL N/A 09/12/2019   Procedure: COLONOSCOPY WITH PROPOFOL;  Surgeon: [name], MD;  Location: [facility] ENDOSCOPY;  Service: Gastroenterology;  Laterality: N/A;  . COLONOSCOPY WITH PROPOFOL N/A 09/13/2019   Procedure: COLONOSCOPY WITH PROPOFOL;  Surgeon: [name], MD;  Location: [facility] ENDOSCOPY;  Service: Gastroenterology;  Laterality: N/A;  . ESOPHAGOGASTRODUODENOSCOPY N/A 09/11/2019   Procedure: ESOPHAGOGASTRODUODENOSCOPY (EGD);  Surgeon: [name], MD;  Location: [facility] ENDOSCOPY;  Service: Endoscopy;  Laterality: N/A;  . EXPLORE EYE SOCKET    . PORTACATH PLACEMENT N/A 09/15/2019   Procedure: INSERTION PORT-A-CATH;  Surgeon: Jules Husbands, MD;  Location: [facility] ORS;  Service: General;  Laterality: N/A;    Current Medications: Current Meds  Medication Sig  . amLODipine (NORVASC) 10 MG tablet Take 1 tablet (10 mg total) by mouth daily.  Marland Kitchen aspirin EC 81 MG EC tablet Take 1 tablet (81 mg total) by mouth daily. Swallow whole.  . ferrous sulfate 325 (65 FE) MG tablet Take 1 tablet (325 mg total) by mouth daily.  . finasteride (PROSCAR) 5 MG tablet Take 5 mg by mouth daily.  . multivitamin (ONE-A-DAY MEN'S) TABS tablet Take 1 tablet by mouth daily.  . ondansetron (ZOFRAN) 8 MG tablet Take 1 tablet (8 mg total) by mouth 2 (two) times daily as needed for refractory nausea / vomiting. Start on day 3 after chemotherapy.  . vitamin B-12 (CYANOCOBALAMIN) 1000 MCG tablet Take 1,000 mcg by mouth daily.  . [DISCONTINUED] capecitabine (XELODA) 500 MG tablet Take 3 tablets (1,500 mg total) by mouth 2 (two) times daily after a meal. Take Monday  through Friday during radiation.  . [DISCONTINUED] clopidogrel (PLAVIX) 75 MG tablet Take 1 tablet (75 mg total) by mouth daily.  . [DISCONTINUED] nystatin (MYCOSTATIN) 100000 UNIT/ML suspension Take 5 mLs by mouth 4 (four) times daily. Swish and spit  Reported by [employer]  . [DISCONTINUED] prochlorperazine (COMPAZINE) 10 MG tablet Take  1 tablet (10 mg total) by mouth every 6 (six) hours as needed (Nausea or vomiting).     Allergies:   Patient has no known allergies.   Social History   Socioeconomic History  . Marital status: Divorced    Spouse name: Not on file  . Number of children: Not on file  . Years of education: Not on file  . Highest education level: Not on file  Occupational History  . 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 Smoking status: Never Smoker  . Smokeless tobacco: Never Used  Vaping Use  . Vaping Use: Never used  Substance and Sexual Activity  . Alcohol use: Never    Alcohol/week: 0.0 standard drinks  . Drug use: Never  . Sexual activity: Not on file  Other Topics Concern  . Not on file  Social History Narrative   Nephew with [place] due to cognitive impairment of patient after stroke 2017      Lives in [place] in [place], [place]   Social Determinants of Health   Financial Resource Strain: Not on file  Food Insecurity: Not on file  Transportation Needs: Unmet Transportation Needs  . Lack of Transportation (Medical): Yes  . Lack of Transportation (Non-Medical): Yes  Physical Activity: Not on file  Stress: Not on file  Social Connections: Not on file     Family History: The patient's family history includes Heart attack in his sister; Leukemia in his brother; Stroke in his brother. History of coronary artery disease notable for sister. History of heart failure notable for no members. History of arrhythmia notable for ICD in brother.  ROS:   Please see the history of present illness.     All other systems reviewed and are negative.  EKGs/Labs/Other Studies Reviewed:    The following studies were reviewed today:  EKG:   01/30/20: Sinus Rhythm with Frequent PACs; nonspecifci TWI 09/07/19: SR 1st HB rate 61, RBBB (BiFB), occasional PVCs  Transthoracic  Echocardiogram: Date: 03/22/2018 Results: Mild AI 1. The left ventricle has normal systolic function, with an ejection  fraction of 55-60%. The cavity size was normal. There is mildly increased  left ventricular wall thickness. Left ventricular diastolic Doppler  parameters are consistent with impaired  relaxation.  2. The right ventricle has normal systolic function. The cavity was  normal. There is no increase in right ventricular wall thickness.  3. The mitral valve is normal in structure. Mild thickening of the mitral  valve leaflet.  4. The tricuspid valve is normal in structure.  5. The aortic valve is normal in structure. Moderate thickening of the  aortic valve Aortic valve regurgitation is mild by color flow Doppler.   NonCardiac CT: Date: 09/13/2019 Results:Aortic Arch Atherosclerosis with RI Coronary Artery Calcium  Recent Labs: 09/13/2019: Magnesium 2.1 04/02/2020: ALT 13; BUN 29; Creatinine, Ser 1.85; Hemoglobin 10.7; Platelets 205; Potassium 4.0; Sodium 142  Recent Lipid Panel    Component Value Date/Time   CHOL 110 09/16/2019 0905   TRIG 73 09/16/2019 0905   HDL 48  09/16/2019 0905   CHOLHDL 2.3 09/16/2019 0905   VLDL 15 09/16/2019 0905   LDLCALC 47 09/16/2019 0905    Risk Assessment/Calculations:     N/A  Physical Exam:    VS:  BP 140/60   Pulse 60   Ht 6\' 3"  (1.905 m)   Wt 246 lb (111.6 kg)   SpO2 94%   BMI 30.75 kg/m     Wt Readings from Last 3 Encounters:  05/03/20 246 lb (111.6 kg)  04/18/20 248 lb 8 oz (112.7 kg)  04/02/20 237 lb (107.5 kg)     GEN:  Well nourished, well developed in no acute distress HEENT: Normal NECK: No JVD; No carotid bruits LYMPHATICS: No lymphadenopathy CARDIAC: RRR, II/VI diastolic murmur, no rubs, gallops; +III Carotid Pulse, JVP to clavicle RESPIRATORY:  Clear to auscultation without rales, wheezing or rhonchi  ABDOMEN: Soft, non-tender, non-distended MUSCULOSKELETAL:  No pitting edema; No deformity  SKIN:  Warm and dry NEUROLOGIC:  Alert and oriented x 3 PSYCHIATRIC:  Normal affect   ASSESSMENT:    1. Nonrheumatic aortic valve insufficiency   2. History of CVA (cerebrovascular accident)   3. Aortic atherosclerosis ([place])   4. Hypertension associated with diabetes ([place])    PLAN:    In order of problems listed above:  Preoperative Risk Assessment Mild Aortic Regurgitation Hx of CVA Aortic Atherosclerosis - No further cardiac testing is recommended prior to surgery.  -No change from prior - will get echocardiogram because of prominence of the heart murmur; this need not necessarily keep him from surgery (orded at last visit- not done and will need it in mid May or Later) - Drop Plavix from out list; ASA can be held prior to surgery - LDL < 70  HTN with DM - ambulatory blood pressure slightly above goal, will continue ambulatory BP monitoring; gave education on how to perform ambulatory blood pressure monitoring including the frequency and technique; goal ambulatory blood pressure < 130/80 on average - continue home medications:  After echo will add a diuretic if BP is still high - discussed diet (DASH/low sodium), and exercise/weight loss interventions  Prior Malignancy with Cardiotoxic Chemotherapy - with Age ?34 y - with other risk factors, including smoking, HTN, DM, HLD, CKD, Obesity, previous prior stroke - LV Function including GLS or other Strain, 3D Eval, MAPSE, or CMR: 55-60% -  (5-FU) and capecitabine without evidence of angina sx or evidence of coronary vasospasm (completed chemotherapy) - Encouraged exercise on a regular basis and healthy dietary habits  Fall follow up unless new symptoms or abnormal test results warranting change in plan Would be reasonable for  APP Follow up    Medication Adjustments/Labs and Tests Ordered: Current medicines are reviewed at length with the patient today.  Concerns regarding medicines are outlined above.  Orders Placed This  Encounter  Procedures  . ECHOCARDIOGRAM COMPLETE   No orders of the defined types were placed in this encounter.   Patient Instructions  Medication Instructions:  Your physician recommends that you continue on your current medications as directed. Please refer to the Current Medication list given to you today. Plavix was removed from your medication list today. *If you need a refill on your cardiac medications before your next appointment, please call your pharmacy*   Lab Work: NONE If you have labs (blood work) drawn today and your tests are completely normal, you will receive your results only by: Marland Kitchen MyChart Message (if you have MyChart) OR . A paper copy in the  mail If you have any lab test that is abnormal or we need to change your treatment, we will call you to review the results.   Testing/Procedures: Your physician has requested that you have an echocardiogram in [facility] May. Echocardiography is a painless test that uses sound waves to create images of your heart. It provides your doctor with information about the size and shape of your heart and how well your heart's chambers and valves are working. This procedure takes approximately one hour. There are no restrictions for this procedure.    Follow-Up: At [facility], you and your health needs are our priority.  As part of our continuing mission to provide you with exceptional heart care, we have created designated Provider Care Teams.  These Care Teams include your primary Cardiologist (physician) and Advanced Practice Providers (APPs -  Physician Assistants and Nurse Practitioners) who all work together to provide you with the care you need, when you need it.  We recommend signing up for the patient portal called "MyChart".  Sign up information is provided on this After Visit Summary.  MyChart is used to connect with patients for Virtual Visits (Telemedicine).  Patients are able to view lab/test results, encounter notes,  upcoming appointments, etc.  Non-urgent messages can be sent to your provider as well.   To learn more about what you can do with MyChart, go to [email or web address].    Your next appointment:   5-6 month(s)  The format for your next appointment:   In Person  Provider:   You may see Rudean Haskell, MD or one of the following Advanced Practice Providers on your designated Care Team:    Melina Copa, PA-C  Ermalinda Barrios, PA-C          Signed, [name], MD  05/03/2020 10:03 AM    [place]

## 2020-05-03 NOTE — Patient Instructions (Signed)
Medication Instructions:  Your physician recommends that you continue on your current medications as directed. Please refer to the Current Medication list given to you today. Plavix was removed from your medication list today. *If you need a refill on your cardiac medications before your next appointment, please call your pharmacy*   Lab Work: NONE If you have labs (blood work) drawn today and your tests are completely normal, you will receive your results only by: Marland Kitchen MyChart Message (if you have MyChart) OR . A paper copy in the mail If you have any lab test that is abnormal or we need to change your treatment, we will call you to review the results.   Testing/Procedures: Your physician has requested that you have an echocardiogram in [facility] May. Echocardiography is a painless test that uses sound waves to create images of your heart. It provides your doctor with information about the size and shape of your heart and how well your heart's chambers and valves are working. This procedure takes approximately one hour. There are no restrictions for this procedure.    Follow-Up: At [facility], you and your health needs are our priority.  As part of our continuing mission to provide you with exceptional heart care, we have created designated Provider Care Teams.  These Care Teams include your primary Cardiologist (physician) and Advanced Practice Providers (APPs -  Physician Assistants and Nurse Practitioners) who all work together to provide you with the care you need, when you need it.  We recommend signing up for the patient portal called "MyChart".  Sign up information is provided on this After Visit Summary.  MyChart is used to connect with patients for Virtual Visits (Telemedicine).  Patients are able to view lab/test results, encounter notes, upcoming appointments, etc.  Non-urgent messages can be sent to your provider as well.   To learn more about what you can do with MyChart, go to  [email or web address].    Your next appointment:   5-6 month(s)  The format for your next appointment:   In Person  Provider:   You may see Rudean Haskell, MD or one of the following Advanced Practice Providers on your designated Care Team:    Melina Copa, PA-C  Ermalinda Barrios, PA-C

## 2020-05-08 NOTE — Progress Notes (Signed)
DUE TO COVID-19 ONLY ONE VISITOR IS ALLOWED TO COME WITH YOU AND STAY IN THE WAITING ROOM ONLY DURING PRE OP AND PROCEDURE DAY OF SURGERY. THE 1 VISITOR  MAY VISIT WITH YOU AFTER SURGERY IN YOUR PRIVATE ROOM DURING VISITING HOURS ONLY!  YOU NEED TO HAVE A COVID 19 TEST ON__4/26/22_____ @_______ , THIS TEST MUST BE DONE BEFORE SURGERY,  COVID TESTING SITE [address] [place] [address], IT IS ON THE RIGHT GOING OUT [address] APPROXIMATELY  2 MINUTES PAST [employer] ON THE RIGHT. ONCE YOUR COVID TEST IS COMPLETED,  PLEASE BEGIN THE QUARANTINE INSTRUCTIONS AS OUTLINED IN YOUR HANDOUT.                Christopher Delacruz  05/08/2020   Your procedure is scheduled on:      05/25/20  Report to [facility] Main  Entrance   Report to admitting at   [place] AM     Call this number if you have problems the morning of surgery [phone]           REMEMBER: FOLLOW ALL [place]. DRINK 2 PRESURGERY ENSURE DRINKS THE NIGHT BEFORE SURGERY AT 1000 PM AND 1 PRESURGERY DRINK THE DAY OF THE PROCEDURE 3 HOURS PRIOR TO SCHEDULED SURGERY.  NOTHING BY MOUTH EXCEPT CLEAR LIQUIDS UNTIL THREE HOURS PRIOR TO SCHEDULED SURGERY. PLEASE FINISH PRESURGERY 3RD  ENSURE  DRINK PER SURGEON ORDER 3 HOURS PRIOR TO SCHEDULED SURGERY TIME WHICH NEEDS TO BE COMPLETED AT _____0415 am ____.  Clear liquid diet on day of Bowel Prep.     CLEAR LIQUID DIET   Foods Allowed                                                                      Coffee and tea, regular and decaf                           Plain Jell-O any favor except red or purple                                         Fruit ices (not with fruit pulp)                                      Iced Popsicles                                     Carbonated beverages, regular and diet                                    Cranberry, grape and apple juices Sports drinks like  Gatorade Lightly seasoned clear broth or consume(fat free) Sugar, honey syrup  _____________________________________________________________________      BRUSH YOUR TEETH MORNING OF SURGERY AND RINSE YOUR MOUTH OUT, NO CHEWING  GUM CANDY OR MINTS.     Take these medicines the morning of surgery with A SIP OF WATER: proscar, amlodipine   DO NOT TAKE ANY DIABETIC MEDICATIONS DAY OF YOUR SURGERY                               You may not have any metal on your body including hair pins and              piercings  Do not wear jewelry, make-up, lotions, powders or perfumes, deodorant             Do not wear nail polish on your fingernails.  Do not shave  48 hours prior to surgery.              Men may shave face and neck.   Do not bring valuables to the hospital. [place].  Contacts, dentures or bridgework may not be worn into surgery.  Leave suitcase in the car. After surgery it may be brought to your room.                 Please read over the following fact sheets you were given: _____________________________________________________________________  [facility] - Preparing for Surgery Before surgery, you can play an important role.  Because skin is not sterile, your skin needs to be as free of germs as possible.  You can reduce the number of germs on your skin by washing with CHG (chlorahexidine gluconate) soap before surgery.  CHG is an antiseptic cleaner which kills germs and bonds with the skin to continue killing germs even after washing. Please DO NOT use if you have an allergy to CHG or antibacterial soaps.  If your skin becomes reddened/irritated stop using the CHG and inform your nurse when you arrive at Short Stay. Do not shave (including legs and underarms) for at least 48 hours prior to the first CHG shower.  You may shave your face/neck. Please follow these instructions carefully:  1.  Shower with CHG Soap the night before  surgery and the  morning of Surgery.  2.  If you choose to wash your hair, wash your hair first as usual with your  normal  shampoo.  3.  After you shampoo, rinse your hair and body thoroughly to remove the  shampoo.                           4.  Use CHG as you would any other liquid soap.  You can apply chg directly  to the skin and wash                       Gently with a scrungie or clean washcloth.  5.  Apply the CHG Soap to your body ONLY FROM THE NECK DOWN.   Do not use on face/ open                           Wound or open sores. Avoid contact with eyes, ears mouth and genitals (private parts).                       Wash face,  Genitals (private parts) with your normal soap.  6.  Wash thoroughly, paying special attention to the area where your surgery  will be performed.  7.  Thoroughly rinse your body with warm water from the neck down.  8.  DO NOT shower/wash with your normal soap after using and rinsing off  the CHG Soap.                9.  Pat yourself dry with a clean towel.            10.  Wear clean pajamas.            11.  Place clean sheets on your bed the night of your first shower and do not  sleep with pets. Day of Surgery : Do not apply any lotions/deodorants the morning of surgery.  Please wear clean clothes to the hospital/surgery center.  FAILURE TO FOLLOW THESE INSTRUCTIONS MAY RESULT IN THE CANCELLATION OF YOUR SURGERY PATIENT SIGNATURE_________________________________  NURSE SIGNATURE__________________________________  ________________________________________________________________________

## 2020-05-15 ENCOUNTER — Encounter (HOSPITAL_COMMUNITY): Payer: Self-pay

## 2020-05-15 ENCOUNTER — Other Ambulatory Visit: Payer: Self-pay

## 2020-05-15 ENCOUNTER — Encounter (HOSPITAL_COMMUNITY)
Admission: RE | Admit: 2020-05-15 | Discharge: 2020-05-15 | Disposition: A | Discharge status: Home / Self Care | Payer: Medicare Other | Source: Ambulatory Visit | Attending: Surgery | Admitting: Surgery

## 2020-05-15 DIAGNOSIS — Z01812 Encounter for preprocedural laboratory examination: Secondary | ICD-10-CM | POA: Diagnosis present

## 2020-05-15 HISTORY — DX: Cardiac murmur, unspecified: R01.1

## 2020-05-15 LAB — BASIC METABOLIC PANEL
Anion gap: 11 (ref 5–15)
BUN: 29 mg/dL — ABNORMAL HIGH (ref 8–23)
CO2: 27 mmol/L (ref 22–32)
Calcium: 9.4 mg/dL (ref 8.9–10.3)
Chloride: 107 mmol/L (ref 98–111)
Creatinine, Ser: 2.13 mg/dL — ABNORMAL HIGH (ref 0.61–1.24)
GFR, Estimated: 32 mL/min — ABNORMAL LOW (ref >60)
Glucose, Bld: 162 mg/dL — ABNORMAL HIGH (ref 70–99)
Potassium: 3.9 mmol/L (ref 3.5–5.1)
Sodium: 145 mmol/L (ref 135–145)

## 2020-05-15 LAB — CBC
HCT: 38.1 % — ABNORMAL LOW (ref 39.0–52.0)
Hemoglobin: 12 g/dL — ABNORMAL LOW (ref 13.0–17.0)
MCH: 29.5 pg (ref 26.0–34.0)
MCHC: 31.5 g/dL (ref 30.0–36.0)
MCV: 93.6 fL (ref 80.0–100.0)
Platelets: 206 10*3/uL (ref 150–400)
RBC: 4.07 MIL/uL — ABNORMAL LOW (ref 4.22–5.81)
RDW: 12.4 % (ref 11.5–15.5)
WBC: 6.7 10*3/uL (ref 4.0–10.5)
nRBC: 0 % (ref 0.0–0.2)

## 2020-05-15 LAB — GLUCOSE, CAPILLARY: Glucose-Capillary: 165 mg/dL — ABNORMAL HIGH (ref 70–99)

## 2020-05-15 LAB — HEMOGLOBIN A1C
Hgb A1c MFr Bld: 5.1 % (ref 4.8–5.6)
Mean Plasma Glucose: 99.67 mg/dL

## 2020-05-15 NOTE — Consult Note (Signed)
[place] Nurse requested for preoperative stoma site marking  Discussed surgical procedure and stoma creation with patient and family.  Explained role of the [place] nurse team.  Provided the patient with educational booklet and provided samples of pouching options.  Answered patient and family questions.   Examined patient sitting, and standing in order to place the marking in the patient's visual field, away from any creases or abdominal contour issues and within the rectus muscle.   Marked for colostomy in the LLQ  _4__ cm to the left of the umbilicus and __4__TM below the umbilicus.  Marked for ileostomy in the RLQ  __3.5__cm to the right of the umbilicus and  _5___ cm below the umbilicus.   Patient's abdomen cleansed with CHG wipes at site markings, allowed to air dry prior to marking.Covered mark with thin film transparent dressing to preserve mark until date of surgery.   [place] Nurse team will follow up with patient after surgery for continue ostomy care and teaching.  [place] MSN, [place], [place], [place]

## 2020-05-15 NOTE — Progress Notes (Addendum)
Anesthesia Review:  PCP: Cardiologist : DR Gasper Sells - 05/03/20 LOV  Chest x-ray : 01/19/20- Ct Chest  09/15/19- One view CXR  EKG :01/30/20  Echo :03/22/2018  Stress test: Cardiac Cath :  Activity level: cannoto do a fight of stairs without difficulty  Sleep Study/ CPAP :none  Fasting Blood Sugar :      / Checks Blood Sugar -- times a day:   Blood Thinner/ Instructions /Last Dose: ASA / Instructions/ Last Dose :  Plavix - per [facility] - they have preop instructions regarding Plavix.   81 mg Aspirin  DM- type 2 diet controlled  Nephew present with pt at preop due to coginitive impairment.  PT alert and oriented.  Able to answer some questions .  Lives at [facility] in [place] .  Nephew has with him copy of preop instructions to take back to [place] in [place].  Nephew, Colston Pyle is POA.   Pt has portacath in place.  BMP done 05/15/20 routed to Dr Johney Maine and to Dr Jeffie Pollock.   [facility]!C- 05/15/20- 5.1  Candida Peeling , nephew stated that [facility] has bowel prep instructions.   Candida Peeling, nephew aware that pt will need to quarantine after covid test done on 05/22/20.  Stated he will make sure [facility] is aware.  [facility] POA is located under Medial Tab dated 08/2019.

## 2020-05-18 ENCOUNTER — Telehealth: Payer: Self-pay | Admitting: *Deleted

## 2020-05-18 NOTE — Progress Notes (Signed)
Anesthesia Chart Review:   Case: [id number] Date/Time: 05/25/20 0715   Procedures:      XI ROBOTIC ASSISTED LOWER ANTERIOR RECTOSIGMOID RESECTION (N/A )     POSSIBLE OSTOMY (N/A )     RIGID PROCTOSCOPY (N/A )     CYSTOSCOPY WITH FIREFLY INJECTIONS (N/A )   Anesthesia type: General   Pre-op diagnosis: RECTAL CANCER   Location: [facility]OM 02 / [facility]   Surgeons: [name], MD; Alexis Frock, MD      DISCUSSION: Pt is 70 years old with hx HTN, DM, heart murmur (mild AR by 2020 echo), stroke (R hemiparesis), B12 deficiency, CKD  Pt to hold plavix 5 days before surgery.    Creatinine at pre-surgical testing is 2.13, up from his usual baseline of 1.7. Discussed with anesthesiologist Dr. Sabra Heck.  -  I left a message with triage at the [facility] in [place] for Dr. Tasia Catchings.  - I also spoke with Merrily Brittle, NP with [facility] (pt's PCP). Ms. Levada Dy will f/u on elevated Cr.    VS: BP (!) 186/72   Pulse 65   Temp 36.9 C (Oral)   Resp 16   Ht 6\' 3"  (1.905 m)   Wt 112.9 kg   SpO2 100%   BMI 31.12 kg/m    PROVIDERS: - PCP is Merrily Brittle, NP with [facility] - Cardiologist is Gregary Signs, MD who cleared pt for surgery at last office visit 05/03/20 - Oncologist is [name], MD   LABS:  - Cr 2.13, BUN 29. Pt's baseline Cr appears to be ~1.7; recent results are Cr 1.85 on 04/02/20 and 1.9 on 03/27/20  (all labs ordered are listed, but only abnormal results are displayed)  Labs Reviewed  CBC - Abnormal; Notable for the following components:      Result Value   RBC 4.07 (*)    Hemoglobin 12.0 (*)    HCT 38.1 (*)    All other components within normal limits  BASIC METABOLIC PANEL - Abnormal; Notable for the following components:   Glucose, Bld 162 (*)    BUN 29 (*)    Creatinine, Ser 2.13 (*)    GFR, Estimated 32 (*)    All other components within normal limits  GLUCOSE, CAPILLARY - Abnormal; Notable for the following components:   Glucose-Capillary 165  (*)    All other components within normal limits  HEMOGLOBIN A1C  TYPE AND SCREEN     IMAGES: CT chest, abd, pelvis 01/19/20:  1. Mild interval decrease in size of circumferential mass involving the rectum. 2. Stable appearance of borderline enlarged porta hepatic and aortocaval lymph nodes. 3. No new or progressive disease identified. 4. Unchanged appearance of scattered, punctate lung nodules which are nonspecific and favored to represent a benign process. 5. Clot versus fibrin sheath surrounds the intravascular component of the right porta catheter at the level of the right internal jugular vein. 6. Aortic atherosclerosis and coronary artery calcifications.  1 view CXR 09/15/19: Port-A-Cath with tip at the cavoatrial junction    EKG 01/30/20: SR with PACs. Non-specific T-wave abnormality   CV: Echo 03/22/18:  1. The left ventricle has normal systolic function, with an ejection fraction of 55-60%. The cavity size was normal. There is mildly increased left ventricular wall thickness. Left ventricular diastolic Doppler parameters are consistent with impaired relaxation.  2. The right ventricle has normal systolic function. The cavity was normal. There is no increase in right ventricular wall thickness.  3. The mitral valve is normal in structure. Mild thickening of the mitral valve leaflet.  4. The tricuspid valve is normal in structure.  5. The aortic valve is normal in structure. Moderate thickening of the aortic valve Aortic valve regurgitation is mild by color flow Doppler.    Carotid duplex 03/22/18:  1. Mild bilateral carotid bifurcation plaque resulting in less than 50% diameter stenosis. 2.  Antegrade bilateral vertebral arterial flow.    Past Medical History:  Diagnosis Date  . Acute renal failure superimposed on stage 3a chronic kidney disease ([place]) 09/07/2019  . B12 deficiency 09/17/2019  . Cancer [facility])    rectal cancer   . CVA (cerebral vascular accident) ([place])    . Diabetes mellitus ([place])    type 2 - controlled by diet on no meds   . Heart murmur   . Hyperlipidemia   . Hypertension   . Right hemiparesis ([place]) 2017   Associated with stroke    Past Surgical History:  Procedure Laterality Date  . COLONOSCOPY N/A 09/11/2019   Procedure: COLONOSCOPY;  Surgeon: [name], MD;  Location[facility] END[facility]COPY;  Service: Endoscopy;  Laterality: N/A;  . COLONOSCOPY WITH PROPOFOL N/A 09/12/2019   Procedure: COLONOSCOPY WITH PROPOFOL;  Surgeon: [name], MD;  Location: [facility] ENDOSCOPY;  Service: Gastroenterology;  Laterality: N/A;  . COLONOSCOPY WITH PROPOFOL N/A 09/13/2019   Procedure: COLONOSCOPY WITH PROPOFOL;  Surgeon: [name], MD;  Location: [facility] ENDOSCOPY;  Service: Gastroenterology;  Laterality: N/A;  . ESOPHAGOGASTRODUODENOSCOPY N/A 09/11/2019   Procedure: ESOPHAGOGASTRODUODENOSCOPY (EGD);  Surgeon: [name], MD;  Location: [facility] ENDOSCOPY;  Service: Endoscopy;  Laterality: N/A;  . EXPLORE EYE SOCKET    . PORTACATH PLACEMENT N/A 09/15/2019   Procedure: INSERTION PORT-A-CATH;  Surgeon: Jules Husbands, MD;  Location: [facility] ORS;  Service: General;  Laterality: N/A;    MEDICATIONS: . amLODipine (NORVASC) 10 MG tablet  . aspirin EC 81 MG EC tablet  . Cholecalciferol (VITAMIN D3) 50 MCG (2000 UT) capsule  . clopidogrel (PLAVIX) 75 MG tablet  . ferrous sulfate 325 (65 FE) MG tablet  . finasteride (PROSCAR) 5 MG tablet  . hydrochlorothiazide (HYDRODIURIL) 12.5 MG tablet  . multivitamin (ONE-A-DAY MEN'S) TABS tablet  . ondansetron (ZOFRAN) 8 MG tablet  . prochlorperazine (COMPAZINE) 10 MG tablet  . sertraline (ZOLOFT) 25 MG tablet  . vitamin B-12 (CYANOCOBALAMIN) 1000 MCG tablet   No current facility-administered medications for this encounter.   . sodium chloride flush (NS) 0.9 % injection 10 mL   - Pt to hold plavix 5 days before surgery   Willeen Cass, PhD, FNP-BC [facility]/Anesthesiology Phone:  [phone] 05/18/2020 3:52 PM

## 2020-05-18 NOTE — Progress Notes (Signed)
Spoke with [place] at [facility] and she stated that patient continues to take Plavix and they are aware that it needs to be held five days prior to surgery which is scheduled for 05/25/20.

## 2020-05-18 NOTE — Telephone Encounter (Signed)
Willeen Cass, NP at [facility] called to leave a message for Dr. Tasia Catchings to call her back regarding this patient's recent lab work, specifically his elevated creatinine level. He is supposed to surgery for rectal cancer on April 29th, and she would like to discuss with Dr. Tasia Catchings. Please call her @ [phone].

## 2020-05-21 NOTE — Telephone Encounter (Signed)
I called this number and talked to [place]. CKD, creatinine is slightly increased than his baseline. Recommend increase oral hydration and follow up during his admission for rectal cancer. Janett Billow will update surgery team and patient.

## 2020-05-22 ENCOUNTER — Other Ambulatory Visit (HOSPITAL_COMMUNITY)
Admission: RE | Admit: 2020-05-22 | Discharge: 2020-05-22 | Disposition: A | Discharge status: Home / Self Care | Payer: Medicare Other | Source: Ambulatory Visit | Attending: Surgery | Admitting: Surgery

## 2020-05-24 MED ORDER — BUPIVACAINE LIPOSOME 1.3 % IJ SUSP
20.0000 mL | Freq: Once | INTRAMUSCULAR | Status: DC
  Filled 2020-05-24: qty 20

## 2020-05-24 NOTE — Anesthesia Preprocedure Evaluation (Addendum)
Anesthesia Evaluation  Patient identified by MRN, date of birth, ID band Patient awake    Reviewed: Allergy & Precautions, NPO status , Patient's Chart, lab work & pertinent test results  Airway Mallampati: II  TM Distance: >3 FB Neck ROM: Limited  Mouth opening: Limited Mouth Opening  Dental  (+) Lower Dentures, Upper Dentures   Pulmonary neg pulmonary ROS,    Pulmonary exam normal breath sounds clear to auscultation + decreased breath sounds      Cardiovascular hypertension, Normal cardiovascular exam+ Valvular Problems/Murmurs AI  Rhythm:Regular Rate:Normal  2020 ECHO:  1. The left ventricle has normal systolic function, with an ejection  fraction of 55-60%. The cavity size was normal. There is mildly increased  left ventricular wall thickness. Left ventricular diastolic Doppler  parameters are consistent with impaired  relaxation.  2. The right ventricle has normal systolic function. The cavity was  normal. There is no increase in right ventricular wall thickness.  3. The mitral valve is normal in structure. Mild thickening of the mitral  valve leaflet.  4. The tricuspid valve is normal in structure.  5. The aortic valve is normal in structure. Moderate thickening of the  aortic valve Aortic valve regurgitation is mild by color flow Doppler.    Neuro/Psych CVA (right hemiparesis; on plavix), Residual Symptoms negative psych ROS   GI/Hepatic Rectal cancer   Endo/Other  diabetes, Type 2  Renal/GU Renal InsufficiencyRenal disease     Musculoskeletal negative musculoskeletal ROS (+)   Abdominal   Peds  Hematology  (+) anemia ,   Anesthesia Other Findings   Reproductive/Obstetrics negative OB ROS                            Anesthesia Physical Anesthesia Plan  ASA: III  Anesthesia Plan: General   Post-op Pain Management:    Induction: Intravenous  PONV Risk Score and Plan:  2  Airway Management Planned: Oral ETT  Additional Equipment:   Intra-op Plan:   Post-operative Plan: Extubation in OR  Informed Consent: I have reviewed the patients History and Physical, chart, labs and discussed the procedure including the risks, benefits and alternatives for the proposed anesthesia with the patient or authorized representative who has indicated his/her understanding and acceptance.       Plan Discussed with: CRNA, Anesthesiologist and Surgeon  Anesthesia Plan Comments: (GETA. 2 PIV. Norton Blizzard, MD  )       Anesthesia Quick Evaluation

## 2020-05-25 ENCOUNTER — Other Ambulatory Visit: Payer: Self-pay

## 2020-05-25 ENCOUNTER — Inpatient Hospital Stay (HOSPITAL_COMMUNITY): Payer: Medicare Other | Admitting: Physician Assistant

## 2020-05-25 ENCOUNTER — Encounter (HOSPITAL_COMMUNITY)
Admission: RE | Disposition: A | Discharge status: Skilled Nursing Facility | Payer: Self-pay | Source: Ambulatory Visit | Attending: Surgery

## 2020-05-25 ENCOUNTER — Encounter (HOSPITAL_COMMUNITY): Payer: Self-pay | Admitting: Surgery

## 2020-05-25 ENCOUNTER — Inpatient Hospital Stay (HOSPITAL_COMMUNITY): Payer: Medicare Other

## 2020-05-25 ENCOUNTER — Inpatient Hospital Stay (HOSPITAL_COMMUNITY)
Admission: RE | Admit: 2020-05-25 | Discharge: 2020-05-29 | DRG: 330 | Disposition: A | Discharge status: Skilled Nursing Facility | Payer: Medicare Other | Source: Ambulatory Visit | Attending: Surgery | Admitting: Surgery

## 2020-05-25 ENCOUNTER — Inpatient Hospital Stay (HOSPITAL_COMMUNITY): Payer: Medicare Other | Admitting: Emergency Medicine

## 2020-05-25 DIAGNOSIS — E1159 Type 2 diabetes mellitus with other circulatory complications: Secondary | ICD-10-CM | POA: Diagnosis present

## 2020-05-25 DIAGNOSIS — Z66 Do not resuscitate: Secondary | ICD-10-CM | POA: Diagnosis present

## 2020-05-25 DIAGNOSIS — N400 Enlarged prostate without lower urinary tract symptoms: Secondary | ICD-10-CM | POA: Diagnosis present

## 2020-05-25 DIAGNOSIS — Z7902 Long term (current) use of antithrombotics/antiplatelets: Secondary | ICD-10-CM | POA: Diagnosis not present

## 2020-05-25 DIAGNOSIS — Z823 Family history of stroke: Secondary | ICD-10-CM

## 2020-05-25 DIAGNOSIS — Z8249 Family history of ischemic heart disease and other diseases of the circulatory system: Secondary | ICD-10-CM

## 2020-05-25 DIAGNOSIS — D631 Anemia in chronic kidney disease: Secondary | ICD-10-CM | POA: Diagnosis present

## 2020-05-25 DIAGNOSIS — R319 Hematuria, unspecified: Secondary | ICD-10-CM | POA: Diagnosis present

## 2020-05-25 DIAGNOSIS — C19 Malignant neoplasm of rectosigmoid junction: Secondary | ICD-10-CM | POA: Diagnosis present

## 2020-05-25 DIAGNOSIS — I69951 Hemiplegia and hemiparesis following unspecified cerebrovascular disease affecting right dominant side: Secondary | ICD-10-CM | POA: Diagnosis not present

## 2020-05-25 DIAGNOSIS — E1122 Type 2 diabetes mellitus with diabetic chronic kidney disease: Secondary | ICD-10-CM | POA: Diagnosis present

## 2020-05-25 DIAGNOSIS — E538 Deficiency of other specified B group vitamins: Secondary | ICD-10-CM | POA: Diagnosis present

## 2020-05-25 DIAGNOSIS — Z833 Family history of diabetes mellitus: Secondary | ICD-10-CM

## 2020-05-25 DIAGNOSIS — Z923 Personal history of irradiation: Secondary | ICD-10-CM | POA: Diagnosis not present

## 2020-05-25 DIAGNOSIS — Z20822 Contact with and (suspected) exposure to covid-19: Secondary | ICD-10-CM | POA: Diagnosis present

## 2020-05-25 DIAGNOSIS — R4701 Aphasia: Secondary | ICD-10-CM | POA: Diagnosis present

## 2020-05-25 DIAGNOSIS — R4189 Other symptoms and signs involving cognitive functions and awareness: Secondary | ICD-10-CM | POA: Diagnosis present

## 2020-05-25 DIAGNOSIS — Z8673 Personal history of transient ischemic attack (TIA), and cerebral infarction without residual deficits: Secondary | ICD-10-CM

## 2020-05-25 DIAGNOSIS — N1832 Chronic kidney disease, stage 3b: Secondary | ICD-10-CM | POA: Diagnosis present

## 2020-05-25 DIAGNOSIS — Z7982 Long term (current) use of aspirin: Secondary | ICD-10-CM | POA: Diagnosis not present

## 2020-05-25 DIAGNOSIS — E119 Type 2 diabetes mellitus without complications: Secondary | ICD-10-CM

## 2020-05-25 DIAGNOSIS — I152 Hypertension secondary to endocrine disorders: Secondary | ICD-10-CM | POA: Diagnosis present

## 2020-05-25 DIAGNOSIS — F0390 Unspecified dementia without behavioral disturbance: Secondary | ICD-10-CM | POA: Diagnosis present

## 2020-05-25 DIAGNOSIS — I10 Essential (primary) hypertension: Secondary | ICD-10-CM | POA: Diagnosis present

## 2020-05-25 DIAGNOSIS — C20 Malignant neoplasm of rectum: Secondary | ICD-10-CM | POA: Diagnosis present

## 2020-05-25 DIAGNOSIS — E1169 Type 2 diabetes mellitus with other specified complication: Secondary | ICD-10-CM | POA: Diagnosis present

## 2020-05-25 DIAGNOSIS — Z79899 Other long term (current) drug therapy: Secondary | ICD-10-CM

## 2020-05-25 DIAGNOSIS — E7800 Pure hypercholesterolemia, unspecified: Secondary | ICD-10-CM | POA: Diagnosis present

## 2020-05-25 DIAGNOSIS — N183 Chronic kidney disease, stage 3 unspecified: Secondary | ICD-10-CM | POA: Diagnosis present

## 2020-05-25 HISTORY — PX: CYSTOSCOPY: SHX5120

## 2020-05-25 HISTORY — PX: PROCTOSCOPY: SHX2266

## 2020-05-25 HISTORY — PX: XI ROBOTIC ASSISTED LOWER ANTERIOR RESECTION: SHX6558

## 2020-05-25 LAB — TYPE AND SCREEN
ABO/RH(D): B NEG
Antibody Screen: NEGATIVE

## 2020-05-25 LAB — GLUCOSE, CAPILLARY
Glucose-Capillary: 175 mg/dL — ABNORMAL HIGH (ref 70–99)
Glucose-Capillary: 170 mg/dL — ABNORMAL HIGH (ref 70–99)

## 2020-05-25 LAB — SARS CORONAVIRUS 2 BY RT PCR (HOSPITAL ORDER, PERFORMED IN ~~LOC~~ HOSPITAL LAB): SARS Coronavirus 2: NEGATIVE

## 2020-05-25 SURGERY — RESECTION, RECTUM, LOW ANTERIOR, ROBOT-ASSISTED
Anesthesia: General | Site: Rectum

## 2020-05-25 MED ORDER — BUPIVACAINE-EPINEPHRINE (PF) 0.25% -1:200000 IJ SOLN
INTRAMUSCULAR | Status: AC
  Filled 2020-05-25: qty 30

## 2020-05-25 MED ORDER — GABAPENTIN 300 MG PO CAPS
300.0000 mg | ORAL_CAPSULE | ORAL | Status: AC
  Administered 2020-05-25: 300 mg via ORAL
  Filled 2020-05-25: qty 1

## 2020-05-25 MED ORDER — VITAMIN D 25 MCG (1000 UNIT) PO TABS
2000.0000 [IU] | ORAL_TABLET | Freq: Every day | ORAL | Status: DC
  Administered 2020-05-26 – 2020-05-29 (×4): 2000 [IU] via ORAL
  Filled 2020-05-25 (×5): qty 2

## 2020-05-25 MED ORDER — SODIUM CHLORIDE 0.9% FLUSH: 3.0000 mL | INTRAVENOUS | Status: DC | PRN

## 2020-05-25 MED ORDER — DIPHENHYDRAMINE HCL 50 MG/ML IJ SOLN: 12.5000 mg | Freq: Four times a day (QID) | INTRAMUSCULAR | Status: DC | PRN

## 2020-05-25 MED ORDER — SIMETHICONE 80 MG PO CHEW
40.0000 mg | CHEWABLE_TABLET | Freq: Four times a day (QID) | ORAL | Status: DC | PRN
  Administered 2020-05-26 – 2020-05-28 (×2): 40 mg via ORAL
  Filled 2020-05-25 (×2): qty 1

## 2020-05-25 MED ORDER — LIDOCAINE 2% (20 MG/ML) 5 ML SYRINGE
INTRAMUSCULAR | Status: AC
  Filled 2020-05-25: qty 5

## 2020-05-25 MED ORDER — INDOCYANINE GREEN 25 MG IV SOLR
INTRAVENOUS | Status: DC | PRN
  Administered 2020-05-25: 6.25 mg via INTRAVENOUS
  Administered 2020-05-25: 7.5 mg via INTRAVENOUS

## 2020-05-25 MED ORDER — PROCHLORPERAZINE MALEATE 10 MG PO TABS
10.0000 mg | ORAL_TABLET | Freq: Four times a day (QID) | ORAL | Status: DC | PRN
Start: 2020-05-25 — End: 2020-05-29
  Filled 2020-05-25: qty 1

## 2020-05-25 MED ORDER — CHLORHEXIDINE GLUCONATE 0.12 % MT SOLN
15.0000 mL | Freq: Once | OROMUCOSAL | Status: AC
  Administered 2020-05-25: 15 mL via OROMUCOSAL

## 2020-05-25 MED ORDER — CHLORHEXIDINE GLUCONATE CLOTH 2 % EX PADS: 6.0000 | MEDICATED_PAD | Freq: Once | CUTANEOUS | Status: DC

## 2020-05-25 MED ORDER — ADULT MULTIVITAMIN W/MINERALS CH
1.0000 | ORAL_TABLET | Freq: Every day | ORAL | Status: DC
  Administered 2020-05-26 – 2020-05-29 (×4): 1 via ORAL
  Filled 2020-05-25 (×4): qty 1

## 2020-05-25 MED ORDER — PROPOFOL 10 MG/ML IV BOLUS
INTRAVENOUS | Status: DC | PRN
  Administered 2020-05-25: 150 mg via INTRAVENOUS

## 2020-05-25 MED ORDER — POLYETHYLENE GLYCOL 3350 17 GM/SCOOP PO POWD: 1.0000 | Freq: Once | ORAL | Status: DC

## 2020-05-25 MED ORDER — ONDANSETRON HCL 4 MG/2ML IJ SOLN: 4.0000 mg | Freq: Once | INTRAMUSCULAR | Status: DC | PRN

## 2020-05-25 MED ORDER — BUPIVACAINE LIPOSOME 1.3 % IJ SUSP
INTRAMUSCULAR | Status: DC | PRN
Start: 2020-05-25 — End: 2020-05-25
  Administered 2020-05-25: 20 mL

## 2020-05-25 MED ORDER — PROCHLORPERAZINE EDISYLATE 10 MG/2ML IJ SOLN: 5.0000 mg | Freq: Four times a day (QID) | INTRAMUSCULAR | Status: DC | PRN

## 2020-05-25 MED ORDER — ROCURONIUM BROMIDE 10 MG/ML (PF) SYRINGE
PREFILLED_SYRINGE | INTRAVENOUS | Status: AC
  Filled 2020-05-25: qty 10

## 2020-05-25 MED ORDER — ENOXAPARIN SODIUM 40 MG/0.4ML IJ SOSY
40.0000 mg | PREFILLED_SYRINGE | Freq: Once | INTRAMUSCULAR | Status: AC
  Administered 2020-05-25: 40 mg via SUBCUTANEOUS
  Filled 2020-05-25: qty 0.4

## 2020-05-25 MED ORDER — SODIUM CHLORIDE (PF) 0.9 % IJ SOLN
INTRAMUSCULAR | Status: AC
  Filled 2020-05-25: qty 10

## 2020-05-25 MED ORDER — LIDOCAINE HCL 2 % IJ SOLN
INTRAMUSCULAR | Status: AC
  Filled 2020-05-25: qty 20

## 2020-05-25 MED ORDER — BISACODYL 5 MG PO TBEC: 20.0000 mg | DELAYED_RELEASE_TABLET | Freq: Once | ORAL | Status: DC

## 2020-05-25 MED ORDER — LIDOCAINE 20MG/ML (2%) 15 ML SYRINGE OPTIME
INTRAMUSCULAR | Status: DC | PRN
  Administered 2020-05-25: 1.5 mg/kg/h via INTRAVENOUS

## 2020-05-25 MED ORDER — HYDROMORPHONE HCL 1 MG/ML IJ SOLN: 0.5000 mg | INTRAMUSCULAR | Status: DC | PRN

## 2020-05-25 MED ORDER — LIP MEDEX EX OINT
1.0000 "application " | TOPICAL_OINTMENT | Freq: Two times a day (BID) | CUTANEOUS | Status: DC
  Administered 2020-05-25 – 2020-05-29 (×8): 1 via TOPICAL
  Filled 2020-05-25 (×2): qty 7

## 2020-05-25 MED ORDER — ALUM & MAG HYDROXIDE-SIMETH 200-200-20 MG/5ML PO SUSP: 30.0000 mL | Freq: Four times a day (QID) | ORAL | Status: DC | PRN

## 2020-05-25 MED ORDER — ONDANSETRON HCL 4 MG PO TABS: 4.0000 mg | ORAL_TABLET | Freq: Four times a day (QID) | ORAL | Status: DC | PRN

## 2020-05-25 MED ORDER — OXYCODONE HCL 5 MG PO TABS: 5.0000 mg | ORAL_TABLET | Freq: Once | ORAL | Status: DC | PRN

## 2020-05-25 MED ORDER — CALCIUM POLYCARBOPHIL 625 MG PO TABS
625.0000 mg | ORAL_TABLET | Freq: Two times a day (BID) | ORAL | Status: DC
  Administered 2020-05-25 – 2020-05-29 (×8): 625 mg via ORAL
  Filled 2020-05-25 (×8): qty 1

## 2020-05-25 MED ORDER — SODIUM CHLORIDE 0.9 % IV SOLN
2.0000 g | INTRAVENOUS | Status: AC
  Administered 2020-05-25: 2 g via INTRAVENOUS
  Filled 2020-05-25: qty 2

## 2020-05-25 MED ORDER — 0.9 % SODIUM CHLORIDE (POUR BTL) OPTIME
TOPICAL | Status: DC | PRN
  Administered 2020-05-25: 2000 mL

## 2020-05-25 MED ORDER — OXYCODONE HCL 5 MG/5ML PO SOLN
5.0000 mg | Freq: Once | ORAL | Status: DC | PRN
Start: 2020-05-25 — End: 2020-05-25

## 2020-05-25 MED ORDER — BUPIVACAINE-EPINEPHRINE (PF) 0.25% -1:200000 IJ SOLN
INTRAMUSCULAR | Status: DC | PRN
  Administered 2020-05-25: 30 mL

## 2020-05-25 MED ORDER — SODIUM CHLORIDE 0.9 % IV SOLN
2.0000 g | Freq: Two times a day (BID) | INTRAVENOUS | Status: AC
  Administered 2020-05-25: 2 g via INTRAVENOUS
  Filled 2020-05-25 (×2): qty 2

## 2020-05-25 MED ORDER — SERTRALINE HCL 25 MG PO TABS
25.0000 mg | ORAL_TABLET | Freq: Every day | ORAL | Status: DC
  Administered 2020-05-25 – 2020-05-28 (×4): 25 mg via ORAL
  Filled 2020-05-25 (×4): qty 1

## 2020-05-25 MED ORDER — ONDANSETRON HCL 4 MG/2ML IJ SOLN
INTRAMUSCULAR | Status: AC
  Filled 2020-05-25: qty 2

## 2020-05-25 MED ORDER — DIPHENHYDRAMINE HCL 12.5 MG/5ML PO ELIX: 12.5000 mg | ORAL_SOLUTION | Freq: Four times a day (QID) | ORAL | Status: DC | PRN

## 2020-05-25 MED ORDER — FINASTERIDE 5 MG PO TABS
5.0000 mg | ORAL_TABLET | Freq: Every day | ORAL | Status: DC
  Administered 2020-05-26 – 2020-05-29 (×4): 5 mg via ORAL
  Filled 2020-05-25 (×4): qty 1

## 2020-05-25 MED ORDER — NEOMYCIN SULFATE 500 MG PO TABS: 1000.0000 mg | ORAL_TABLET | ORAL | Status: DC

## 2020-05-25 MED ORDER — PROPOFOL 10 MG/ML IV BOLUS
INTRAVENOUS | Status: AC
  Filled 2020-05-25: qty 40

## 2020-05-25 MED ORDER — STERILE WATER FOR INJECTION IJ SOLN
INTRAMUSCULAR | Status: AC
  Filled 2020-05-25: qty 10

## 2020-05-25 MED ORDER — IOHEXOL 300 MG/ML  SOLN
INTRAMUSCULAR | Status: DC | PRN
  Administered 2020-05-25: 10 mL

## 2020-05-25 MED ORDER — METOPROLOL TARTRATE 5 MG/5ML IV SOLN: 5.0000 mg | Freq: Four times a day (QID) | INTRAVENOUS | Status: DC | PRN

## 2020-05-25 MED ORDER — SODIUM CHLORIDE 0.9 % IV SOLN: Freq: Three times a day (TID) | INTRAVENOUS | Status: DC | PRN

## 2020-05-25 MED ORDER — PHENYLEPHRINE HCL (PRESSORS) 10 MG/ML IV SOLN
INTRAVENOUS | Status: AC
  Filled 2020-05-25: qty 1

## 2020-05-25 MED ORDER — ENSURE PRE-SURGERY PO LIQD: 592.0000 mL | Freq: Once | ORAL | Status: DC

## 2020-05-25 MED ORDER — AMLODIPINE BESYLATE 10 MG PO TABS
5.0000 mg | ORAL_TABLET | Freq: Every day | ORAL | Status: DC
  Administered 2020-05-26 – 2020-05-27 (×2): 10 mg via ORAL
  Administered 2020-05-28: 5 mg via ORAL
  Administered 2020-05-29: 10 mg via ORAL
  Filled 2020-05-25 (×5): qty 1

## 2020-05-25 MED ORDER — ORAL CARE MOUTH RINSE: 15.0000 mL | Freq: Once | OROMUCOSAL | Status: AC

## 2020-05-25 MED ORDER — FENTANYL CITRATE (PF) 100 MCG/2ML IJ SOLN
INTRAMUSCULAR | Status: AC
  Filled 2020-05-25: qty 2

## 2020-05-25 MED ORDER — DEXAMETHASONE SODIUM PHOSPHATE 10 MG/ML IJ SOLN
INTRAMUSCULAR | Status: DC | PRN
  Administered 2020-05-25: 4 mg via INTRAVENOUS

## 2020-05-25 MED ORDER — TRAMADOL HCL 50 MG PO TABS
50.0000 mg | ORAL_TABLET | Freq: Four times a day (QID) | ORAL | Status: DC | PRN
  Administered 2020-05-26: 100 mg via ORAL
  Filled 2020-05-25: qty 2
  Filled 2020-05-25: qty 1

## 2020-05-25 MED ORDER — HEPARIN SODIUM (PORCINE) 5000 UNIT/ML IJ SOLN
5000.0000 [IU] | Freq: Two times a day (BID) | INTRAMUSCULAR | Status: DC
  Administered 2020-05-26 – 2020-05-29 (×7): 5000 [IU] via SUBCUTANEOUS
  Filled 2020-05-25 (×7): qty 1

## 2020-05-25 MED ORDER — ROCURONIUM BROMIDE 10 MG/ML (PF) SYRINGE
PREFILLED_SYRINGE | INTRAVENOUS | Status: DC | PRN
  Administered 2020-05-25 (×4): 10 mg via INTRAVENOUS
  Administered 2020-05-25: 20 mg via INTRAVENOUS
  Administered 2020-05-25: 80 mg via INTRAVENOUS
  Administered 2020-05-25: 20 mg via INTRAVENOUS
  Administered 2020-05-25: 10 mg via INTRAVENOUS

## 2020-05-25 MED ORDER — SUGAMMADEX SODIUM 500 MG/5ML IV SOLN
INTRAVENOUS | Status: AC
  Filled 2020-05-25: qty 5

## 2020-05-25 MED ORDER — ALVIMOPAN 12 MG PO CAPS
12.0000 mg | ORAL_CAPSULE | ORAL | Status: AC
  Administered 2020-05-25: 12 mg via ORAL
  Filled 2020-05-25: qty 1

## 2020-05-25 MED ORDER — LACTATED RINGERS IV BOLUS: 1000.0000 mL | Freq: Three times a day (TID) | INTRAVENOUS | Status: AC | PRN

## 2020-05-25 MED ORDER — ENSURE SURGERY PO LIQD: 237.0000 mL | Freq: Two times a day (BID) | ORAL | Status: DC

## 2020-05-25 MED ORDER — SUCCINYLCHOLINE CHLORIDE 200 MG/10ML IV SOSY: PREFILLED_SYRINGE | INTRAVENOUS | Status: DC | PRN

## 2020-05-25 MED ORDER — ENSURE PRE-SURGERY PO LIQD
296.0000 mL | Freq: Once | ORAL | Status: DC
  Filled 2020-05-25: qty 296

## 2020-05-25 MED ORDER — METRONIDAZOLE 500 MG PO TABS: 1000.0000 mg | ORAL_TABLET | ORAL | Status: DC

## 2020-05-25 MED ORDER — SUGAMMADEX SODIUM 200 MG/2ML IV SOLN
INTRAVENOUS | Status: DC | PRN
  Administered 2020-05-25: 250 mg via INTRAVENOUS

## 2020-05-25 MED ORDER — ASPIRIN EC 81 MG PO TBEC
81.0000 mg | DELAYED_RELEASE_TABLET | Freq: Every day | ORAL | Status: DC
  Administered 2020-05-26 – 2020-05-29 (×4): 81 mg via ORAL
  Filled 2020-05-25 (×4): qty 1

## 2020-05-25 MED ORDER — ORAL CARE MOUTH RINSE: 15.0000 mL | Freq: Once | OROMUCOSAL | Status: DC

## 2020-05-25 MED ORDER — INSULIN ASPART 100 UNIT/ML IJ SOLN: 0.0000 [IU] | Freq: Every day | INTRAMUSCULAR | Status: DC

## 2020-05-25 MED ORDER — ALVIMOPAN 12 MG PO CAPS
12.0000 mg | ORAL_CAPSULE | Freq: Two times a day (BID) | ORAL | Status: DC
  Administered 2020-05-26 (×2): 12 mg via ORAL
  Filled 2020-05-25 (×4): qty 1

## 2020-05-25 MED ORDER — PHENYLEPHRINE HCL-NACL 10-0.9 MG/250ML-% IV SOLN
INTRAVENOUS | Status: DC | PRN
  Administered 2020-05-25: 20 ug/min via INTRAVENOUS

## 2020-05-25 MED ORDER — CHLORHEXIDINE GLUCONATE 0.12 % MT SOLN: 15.0000 mL | Freq: Once | OROMUCOSAL | Status: DC

## 2020-05-25 MED ORDER — ONDANSETRON HCL 4 MG/2ML IJ SOLN
INTRAMUSCULAR | Status: DC | PRN
  Administered 2020-05-25: 4 mg via INTRAVENOUS

## 2020-05-25 MED ORDER — SODIUM CHLORIDE 0.9 % IV SOLN: 250.0000 mL | INTRAVENOUS | Status: DC | PRN

## 2020-05-25 MED ORDER — LACTATED RINGERS IV SOLN: INTRAVENOUS | Status: DC

## 2020-05-25 MED ORDER — LACTATED RINGERS IV SOLN: INTRAVENOUS | Status: DC | PRN

## 2020-05-25 MED ORDER — VITAMIN B-12 1000 MCG PO TABS
1000.0000 ug | ORAL_TABLET | Freq: Every day | ORAL | Status: DC
  Administered 2020-05-26 – 2020-05-29 (×4): 1000 ug via ORAL
  Filled 2020-05-25 (×4): qty 1

## 2020-05-25 MED ORDER — LACTATED RINGERS IR SOLN
Status: DC | PRN
  Administered 2020-05-25: 1000 mL

## 2020-05-25 MED ORDER — MELATONIN 3 MG PO TABS
3.0000 mg | ORAL_TABLET | Freq: Every evening | ORAL | Status: DC | PRN
  Administered 2020-05-26: 3 mg via ORAL
  Filled 2020-05-25: qty 1

## 2020-05-25 MED ORDER — ONDANSETRON HCL 4 MG/2ML IJ SOLN: 4.0000 mg | Freq: Four times a day (QID) | INTRAMUSCULAR | Status: DC | PRN

## 2020-05-25 MED ORDER — ENOXAPARIN SODIUM 40 MG/0.4ML IJ SOSY: 40.0000 mg | PREFILLED_SYRINGE | INTRAMUSCULAR | Status: DC

## 2020-05-25 MED ORDER — STERILE WATER FOR IRRIGATION IR SOLN
Status: DC | PRN
  Administered 2020-05-25: 1000 mL

## 2020-05-25 MED ORDER — ACETAMINOPHEN 500 MG PO TABS
1000.0000 mg | ORAL_TABLET | Freq: Four times a day (QID) | ORAL | Status: DC
  Administered 2020-05-26 – 2020-05-29 (×9): 1000 mg via ORAL
  Filled 2020-05-25 (×12): qty 2

## 2020-05-25 MED ORDER — LACTATED RINGERS IV SOLN: INTRAVENOUS | Status: AC

## 2020-05-25 MED ORDER — PHENYLEPHRINE 40 MCG/ML (10ML) SYRINGE FOR IV PUSH (FOR BLOOD PRESSURE SUPPORT)
PREFILLED_SYRINGE | INTRAVENOUS | Status: DC | PRN
  Administered 2020-05-25 (×2): 40 ug via INTRAVENOUS

## 2020-05-25 MED ORDER — SODIUM CHLORIDE 0.9% FLUSH
3.0000 mL | Freq: Two times a day (BID) | INTRAVENOUS | Status: DC
  Administered 2020-05-26 – 2020-05-28 (×4): 3 mL via INTRAVENOUS

## 2020-05-25 MED ORDER — MAGIC MOUTHWASH
15.0000 mL | Freq: Four times a day (QID) | ORAL | Status: DC | PRN
  Filled 2020-05-25: qty 15

## 2020-05-25 MED ORDER — EPHEDRINE SULFATE-NACL 50-0.9 MG/10ML-% IV SOSY
PREFILLED_SYRINGE | INTRAVENOUS | Status: DC | PRN
  Administered 2020-05-25: 5 mg via INTRAVENOUS
  Administered 2020-05-25: 15 mg via INTRAVENOUS
  Administered 2020-05-25: 10 mg via INTRAVENOUS
  Administered 2020-05-25 (×4): 5 mg via INTRAVENOUS

## 2020-05-25 MED ORDER — FENTANYL CITRATE (PF) 100 MCG/2ML IJ SOLN: 25.0000 ug | INTRAMUSCULAR | Status: DC | PRN

## 2020-05-25 MED ORDER — LIDOCAINE 2% (20 MG/ML) 5 ML SYRINGE
INTRAMUSCULAR | Status: DC | PRN
  Administered 2020-05-25: 80 mg via INTRAVENOUS

## 2020-05-25 MED ORDER — INSULIN ASPART 100 UNIT/ML IJ SOLN
0.0000 [IU] | Freq: Three times a day (TID) | INTRAMUSCULAR | Status: DC
  Administered 2020-05-26 (×2): 2 [IU] via SUBCUTANEOUS
  Administered 2020-05-26: 3 [IU] via SUBCUTANEOUS
  Administered 2020-05-27 – 2020-05-28 (×4): 2 [IU] via SUBCUTANEOUS
  Administered 2020-05-28: 3 [IU] via SUBCUTANEOUS
  Administered 2020-05-28: 2 [IU] via SUBCUTANEOUS
  Administered 2020-05-29 (×2): 3 [IU] via SUBCUTANEOUS

## 2020-05-25 MED ORDER — DEXAMETHASONE SODIUM PHOSPHATE 10 MG/ML IJ SOLN
INTRAMUSCULAR | Status: AC
  Filled 2020-05-25: qty 1

## 2020-05-25 MED ORDER — ACETAMINOPHEN 500 MG PO TABS
1000.0000 mg | ORAL_TABLET | ORAL | Status: AC
  Administered 2020-05-25: 1000 mg via ORAL
  Filled 2020-05-25: qty 2

## 2020-05-25 MED ORDER — STERILE WATER FOR INJECTION IJ SOLN
INTRAMUSCULAR | Status: DC | PRN
  Administered 2020-05-25: 15 mL via INTRAMUSCULAR

## 2020-05-25 MED ORDER — ENALAPRILAT 1.25 MG/ML IV SOLN
0.6250 mg | Freq: Four times a day (QID) | INTRAVENOUS | Status: DC | PRN
  Filled 2020-05-25: qty 1

## 2020-05-25 MED ORDER — FENTANYL CITRATE (PF) 250 MCG/5ML IJ SOLN
INTRAMUSCULAR | Status: DC | PRN
  Administered 2020-05-25: 25 ug via INTRAVENOUS
  Administered 2020-05-25: 50 ug via INTRAVENOUS
  Administered 2020-05-25: 25 ug via INTRAVENOUS
  Administered 2020-05-25: 50 ug via INTRAVENOUS

## 2020-05-25 SURGICAL SUPPLY — 118 items
ADAPTER GOLDBERG URETERAL (ADAPTER) ×5 IMPLANT
APPLIER CLIP 5 13 M/L LIGAMAX5 (MISCELLANEOUS)
APPLIER CLIP ROT 10 11.4 M/L (STAPLE)
BAG URO CATCHER STRL LF (MISCELLANEOUS) ×5 IMPLANT
BASKET ZERO TIP NITINOL 2.4FR (BASKET) IMPLANT
BLADE EXTENDED COATED 6.5IN (ELECTRODE) IMPLANT
CANNULA REDUC XI 12-8 STAPL (CANNULA)
CANNULA REDUCER 12-8 DVNC XI (CANNULA) IMPLANT
CATH INTERMIT  6FR 70CM (CATHETERS) ×5 IMPLANT
CATH URET 5FR 28IN OPEN ENDED (CATHETERS) ×5 IMPLANT
CELLS DAT CNTRL 66122 CELL SVR (MISCELLANEOUS) IMPLANT
CHLORAPREP W/TINT 26 (MISCELLANEOUS) IMPLANT
CLIP APPLIE 5 13 M/L LIGAMAX5 (MISCELLANEOUS) IMPLANT
CLIP APPLIE ROT 10 11.4 M/L (STAPLE) IMPLANT
CLOTH BEACON ORANGE TIMEOUT ST (SAFETY) ×5 IMPLANT
COVER SURGICAL LIGHT HANDLE (MISCELLANEOUS) ×10 IMPLANT
COVER TIP SHEARS 8 DVNC (MISCELLANEOUS) ×4 IMPLANT
COVER TIP SHEARS 8MM DA VINCI (MISCELLANEOUS) ×1
COVER WAND RF STERILE (DRAPES) ×5 IMPLANT
DECANTER SPIKE VIAL GLASS SM (MISCELLANEOUS) ×5 IMPLANT
DEVICE TROCAR PUNCTURE CLOSURE (ENDOMECHANICALS) IMPLANT
DRAIN CHANNEL 19F RND (DRAIN) IMPLANT
DRAPE ARM DVNC X/XI (DISPOSABLE) ×16 IMPLANT
DRAPE COLUMN DVNC XI (DISPOSABLE) ×4 IMPLANT
DRAPE DA VINCI XI ARM (DISPOSABLE) ×4
DRAPE DA VINCI XI COLUMN (DISPOSABLE) ×1
DRAPE SURG IRRIG POUCH 19X23 (DRAPES) ×5 IMPLANT
DRSG OPSITE POSTOP 3X4 (GAUZE/BANDAGES/DRESSINGS) ×5 IMPLANT
DRSG OPSITE POSTOP 4X10 (GAUZE/BANDAGES/DRESSINGS) IMPLANT
DRSG OPSITE POSTOP 4X6 (GAUZE/BANDAGES/DRESSINGS) IMPLANT
DRSG OPSITE POSTOP 4X8 (GAUZE/BANDAGES/DRESSINGS) IMPLANT
DRSG TEGADERM 2-3/8X2-3/4 SM (GAUZE/BANDAGES/DRESSINGS) ×25 IMPLANT
DRSG TEGADERM 4X4.75 (GAUZE/BANDAGES/DRESSINGS) IMPLANT
ELECT PENCIL ROCKER SW 15FT (MISCELLANEOUS) ×5 IMPLANT
ELECT REM PT RETURN 15FT ADLT (MISCELLANEOUS) ×5 IMPLANT
ENDOLOOP SUT PDS II  0 18 (SUTURE)
ENDOLOOP SUT PDS II 0 18 (SUTURE) IMPLANT
EVACUATOR SILICONE 100CC (DRAIN) IMPLANT
GAUZE SPONGE 2X2 8PLY STRL LF (GAUZE/BANDAGES/DRESSINGS) ×4 IMPLANT
GLOVE SURG ENC TEXT LTX SZ7.5 (GLOVE) ×5 IMPLANT
GLOVE SURG LTX SZ8 (GLOVE) ×15 IMPLANT
GLOVE SURG UNDER LTX SZ8 (GLOVE) ×15 IMPLANT
GOWN STRL REUS W/TWL LRG LVL3 (GOWN DISPOSABLE) ×5 IMPLANT
GOWN STRL REUS W/TWL XL LVL3 (GOWN DISPOSABLE) ×15 IMPLANT
GRASPER SUT TROCAR 14GX15 (MISCELLANEOUS) IMPLANT
GUIDEWIRE ANG ZIPWIRE 038X150 (WIRE) ×5 IMPLANT
GUIDEWIRE STR DUAL SENSOR (WIRE) IMPLANT
HOLDER FOLEY CATH W/STRAP (MISCELLANEOUS) ×5 IMPLANT
IRRIG SUCT STRYKERFLOW 2 WTIP (MISCELLANEOUS) ×5
IRRIGATION SUCT STRKRFLW 2 WTP (MISCELLANEOUS) ×4 IMPLANT
KIT PROCEDURE DA VINCI SI (MISCELLANEOUS)
KIT PROCEDURE DVNC SI (MISCELLANEOUS) IMPLANT
KIT SIGMOIDOSCOPE (SET/KITS/TRAYS/PACK) IMPLANT
KIT TURNOVER KIT A (KITS) ×10 IMPLANT
MANIFOLD NEPTUNE II (INSTRUMENTS) ×5 IMPLANT
NEEDLE INSUFFLATION 14GA 120MM (NEEDLE) ×5 IMPLANT
PACK CARDIOVASCULAR III (CUSTOM PROCEDURE TRAY) ×5 IMPLANT
PACK COLON (CUSTOM PROCEDURE TRAY) ×5 IMPLANT
PACK CYSTO (CUSTOM PROCEDURE TRAY) ×5 IMPLANT
PAD POSITIONING PINK XL (MISCELLANEOUS) ×5 IMPLANT
PORT LAP GEL ALEXIS MED 5-9CM (MISCELLANEOUS) ×5 IMPLANT
PROTECTOR NERVE ULNAR (MISCELLANEOUS) ×10 IMPLANT
RELOAD STAPLER 3.5X45 BLU DVNC (STAPLE) IMPLANT
RELOAD STAPLER 3.5X60 BLU DVNC (STAPLE) IMPLANT
RELOAD STAPLER 4.3X45 GRN DVNC (STAPLE) IMPLANT
RELOAD STAPLER 4.3X60 GRN DVNC (STAPLE) ×4 IMPLANT
RTRCTR WOUND ALEXIS 18CM MED (MISCELLANEOUS)
SCISSORS LAP 5X35 DISP (ENDOMECHANICALS) ×5 IMPLANT
SEAL CANN UNIV 5-8 DVNC XI (MISCELLANEOUS) ×12 IMPLANT
SEAL XI 5MM-8MM UNIVERSAL (MISCELLANEOUS) ×3
SEALER VESSEL DA VINCI XI (MISCELLANEOUS) ×1
SEALER VESSEL EXT DVNC XI (MISCELLANEOUS) ×4 IMPLANT
SOLUTION ELECTROLUBE (MISCELLANEOUS) ×5 IMPLANT
SPONGE GAUZE 2X2 STER 10/PKG (GAUZE/BANDAGES/DRESSINGS) ×1
STAPLER 45 DA VINCI SURE FORM (STAPLE)
STAPLER 45 SUREFORM DVNC (STAPLE) IMPLANT
STAPLER 60 DA VINCI SURE FORM (STAPLE) ×1
STAPLER 60 SUREFORM DVNC (STAPLE) ×4 IMPLANT
STAPLER CANNULA SEAL DVNC XI (STAPLE) ×4 IMPLANT
STAPLER CANNULA SEAL XI (STAPLE) ×1
STAPLER ECHELON POWER CIR 29 (STAPLE) IMPLANT
STAPLER ECHELON POWER CIR 31 (STAPLE) IMPLANT
STAPLER RELOAD 3.5X45 BLU DVNC (STAPLE)
STAPLER RELOAD 3.5X45 BLUE (STAPLE)
STAPLER RELOAD 3.5X60 BLU DVNC (STAPLE)
STAPLER RELOAD 3.5X60 BLUE (STAPLE)
STAPLER RELOAD 4.3X45 GREEN (STAPLE)
STAPLER RELOAD 4.3X45 GRN DVNC (STAPLE)
STAPLER RELOAD 4.3X60 GREEN (STAPLE) ×1
STAPLER RELOAD 4.3X60 GRN DVNC (STAPLE) ×4
STOPCOCK 4 WAY LG BORE MALE ST (IV SETS) ×10 IMPLANT
SURGILUBE 2OZ TUBE FLIPTOP (MISCELLANEOUS) IMPLANT
SUT MNCRL AB 4-0 PS2 18 (SUTURE) ×5 IMPLANT
SUT PDS AB 1 CT1 27 (SUTURE) ×10 IMPLANT
SUT PROLENE 0 CT 2 (SUTURE) IMPLANT
SUT PROLENE 2 0 KS (SUTURE) IMPLANT
SUT PROLENE 2 0 SH DA (SUTURE) IMPLANT
SUT SILK 2 0 (SUTURE)
SUT SILK 2 0 SH CR/8 (SUTURE) IMPLANT
SUT SILK 2-0 18XBRD TIE 12 (SUTURE) IMPLANT
SUT SILK 3 0 (SUTURE)
SUT SILK 3 0 SH CR/8 (SUTURE) ×5 IMPLANT
SUT SILK 3-0 18XBRD TIE 12 (SUTURE) IMPLANT
SUT V-LOC BARB 180 2/0GR6 GS22 (SUTURE)
SUT VIC AB 3-0 SH 18 (SUTURE) IMPLANT
SUT VIC AB 3-0 SH 27 (SUTURE)
SUT VIC AB 3-0 SH 27XBRD (SUTURE) IMPLANT
SUT VICRYL 0 UR6 27IN ABS (SUTURE) ×5 IMPLANT
SUTURE V-LC BRB 180 2/0GR6GS22 (SUTURE) IMPLANT
SYR 10ML ECCENTRIC (SYRINGE) ×5 IMPLANT
SYS LAPSCP GELPORT 120MM (MISCELLANEOUS)
SYSTEM LAPSCP GELPORT 120MM (MISCELLANEOUS) IMPLANT
TOWEL OR NON WOVEN STRL DISP B (DISPOSABLE) ×5 IMPLANT
TRAY FOLEY MTR SLVR 16FR STAT (SET/KITS/TRAYS/PACK) ×5 IMPLANT
TROCAR ADV FIXATION 5X100MM (TROCAR) ×5 IMPLANT
TUBING CONNECTING 10 (TUBING) ×15 IMPLANT
TUBING INSUFFLATION 10FT LAP (TUBING) ×5 IMPLANT
TUBING UROLOGY SET (TUBING) IMPLANT

## 2020-05-25 NOTE — Interval H&P Note (Signed)
History and Physical Interval Note:  05/25/2020 7:29 AM  Christopher Delacruz  has presented today for surgery, with the diagnosis of RECTAL CANCER.  The various methods of treatment have been discussed with the patient and family. After consideration of risks, benefits and other options for treatment, the patient has consented to  Procedure(s): XI ROBOTIC ASSISTED LOWER ANTERIOR RECTOSIGMOID RESECTION (N/A) POSSIBLE OSTOMY (N/A) RIGID PROCTOSCOPY (N/A) CYSTOSCOPY WITH FIREFLY INJECTIONS (N/A) as a surgical intervention.  The patient's history has been reviewed, patient examined, no change in status, stable for surgery.  I have reviewed the patient's chart and labs.  Questions were answered to the patient's satisfaction.    I have re-reviewed the the patient's records, history, medications, and allergies.  I have re-examined the patient.  I again discussed intraoperative plans and goals of post-operative recovery.  The patient agrees to proceed.  Christopher Delacruz  13-May-1950 [id number]  Patient Care Team: [name], MD as PCP - General (Family Medicine) Borders, Kirt Boys, NP as Nurse Practitioner (Hospice and Palliative Medicine) [name], MD as Consulting Physician (Oncology) [name], MD as Referring Physician (Radiation Oncology) [name], NP as Nurse Practitioner [name], RN as Oncology Nurse Navigator [name], MD as Consulting Physician (Neurology) [name], MD as Consulting Physician (Gastroenterology) [name], MD as Consulting Physician (Psychiatry) [name], MD as Consulting Physician (Colon and Rectal Surgery) [name], MD as Consulting Physician (Cardiology)  Patient Active Problem List   Diagnosis Date Noted   Cognitive impairment 09/27/2019    Priority: High   Rectal cancer [facility])     Priority: High   History of CVA (cerebrovascular accident) 01/03/2020    Priority: Medium   Aortic atherosclerosis ([place])  05/03/2020   Port-A-Cath in place 03/19/2020   Anemia in stage 3b chronic kidney disease ([place]) 03/19/2020   Pre-op evaluation 01/30/2020   Aortic valve regurgitation 01/30/2020   Hypertension associated with diabetes ([place]) 01/30/2020   Lower extremity edema 01/30/2020   CKD (chronic kidney disease) stage 3, GFR 30-59 ml/min ([place]) 01/03/2020   Encounter for antineoplastic chemotherapy 09/27/2019   Goals of care, counseling/discussion 09/22/2019   Swelling of upper arm 09/22/2019   B12 deficiency 09/17/2019   Palliative care by specialist    DNR (do not resuscitate) discussion    Helicobacter pylori gastritis    Intestinal neoplasm    BPH (benign prostatic hyperplasia) 09/07/2019   Absolute anemia 09/07/2019   GIB (gastrointestinal bleeding) 09/07/2019   Iron deficiency anemia due to chronic blood loss 09/07/2019   CVA (cerebral vascular accident) ([place]) 03/22/2018   HLD (hyperlipidemia) 03/21/2018   Weakness generalized 06/26/2015   Dysphagia 06/26/2015   Essential hypertension, malignant 06/26/2015   Hypokalemia 06/26/2015   Cerebral infarction ([place]) 06/23/2015   Diabetes ([place]) 06/23/2015   HTN (hypertension) 06/23/2015    Past Medical History:  Diagnosis Date   Acute renal failure superimposed on stage 3a chronic kidney disease ([place]) 09/07/2019   B12 deficiency 09/17/2019   Cancer ([place])    rectal cancer    CVA (cerebral vascular accident) ([place])    Diabetes mellitus ([place])    type 2 - controlled by diet on no meds    Heart murmur    Hyperlipidemia    Hypertension    Right hemiparesis ([place]) 2017   Associated with stroke    Past Surgical History:  Procedure Laterality Date   COLONOSCOPY N/A 09/11/2019   Procedure: COLONOSCOPY;  Surgeon: [name], MD;  Location: [facility] ENDOSCOPY;  Service:  Endoscopy;  Laterality: N/A;   COLONOSCOPY WITH PROPOFOL N/A 09/12/2019   Procedure: COLONOSCOPY WITH PROPOFOL;  Surgeon: [name], MD;  Location: [facility] ENDOSCOPY;  Service:  Gastroenterology;  Laterality: N/A;   COLONOSCOPY WITH PROPOFOL N/A 09/13/2019   Procedure: COLONOSCOPY WITH PROPOFOL;  Surgeon: [name], MD;  Location: [facility] ENDOSCOPY;  Service: Gastroenterology;  Laterality: N/A;   ESOPHAGOGASTRODUODENOSCOPY N/A 09/11/2019   Procedure: ESOPHAGOGASTRODUODENOSCOPY (EGD);  Surgeon: [name], MD;  Location: [facility] ENDOSCOPY;  Service: Endoscopy;  Laterality: N/A;   EXPLORE EYE SOCKET     PORTACATH PLACEMENT N/A 09/15/2019   Procedure: INSERTION PORT-A-CATH;  Surgeon: Jules Husbands, MD;  Location: [facility] ORS;  Service: General;  Laterality: N/A;    Social History   Socioeconomic History   Marital status: Divorced    Spouse name: Not on file   Number of children: Not on file   Years of education: Not on file   Highest education level: Not on file  Occupational History   Occupation: )    Employer: [employer]    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  Tobacco Use   Smoking status: Never Smoker   Smokeless tobacco: Never Used  Scientific laboratory technician Use: Never used  Substance and Sexual Activity   Alcohol use: Never    Alcohol/week: 0.0 standard drinks   Drug use: Never   Sexual activity: Not on file  Other Topics Concern   Not on file  Social History Narrative   Nephew with [place] due to cognitive impairment of patient after stroke 2017      Lives in [place] in [place], [place]   Social Determinants of Health   Financial Resource Strain: Not on file  Food Insecurity: Not on file  Transportation Needs: Unmet Transportation Needs   Lack of Transportation (Medical): Yes   Lack of Transportation (Non-Medical): Yes  Physical Activity: Not on file  Stress: Not on file  Social Connections: Not on file  Intimate Partner Violence: Not on file    Family History  Problem Relation Age of Onset   Heart attack Sister    Leukemia Brother    Stroke Brother     Medications Prior to Admission   Medication Sig Dispense Refill Last Dose   amLODipine (NORVASC) 10 MG tablet Take 1 tablet (10 mg total) by mouth daily. 30 tablet 1 05/25/2020 at Unknown time   aspirin EC 81 MG EC tablet Take 1 tablet (81 mg total) by mouth daily. Swallow whole. 30 tablet 11 Past Week at Unknown time   capecitabine (XELODA) 500 MG tablet TAKE 3 TABLETS (1,500 MG TOTAL) BY MOUTH 2 (TWO) TIMES DAILY AFTER A MEAL. TAKE MONDAY THROUGH FRIDAY DURING RADIATION. 150 tablet 0 Past Week at Unknown time   Cholecalciferol (VITAMIN D3) 50 MCG (2000 UT) capsule Take 2,000 Units by mouth daily.   Past Week at Unknown time   clopidogrel (PLAVIX) 75 MG tablet Take 75 mg by mouth daily.   05/19/2020   ferrous sulfate 325 (65 FE) MG tablet Take 1 tablet (325 mg total) by mouth daily. 30 tablet 3 Past Week at Unknown time   finasteride (PROSCAR) 5 MG tablet Take 5 mg by mouth daily.   05/25/2020 at Unknown time   hydrochlorothiazide (HYDRODIURIL) 12.5 MG tablet Take 12.5 mg by mouth daily.   Past Week at Unknown time   multivitamin (ONE-A-DAY MEN'S) TABS tablet Take 1 tablet by mouth daily.   Past  Week at Unknown time   sertraline (ZOLOFT) 25 MG tablet Take 25 mg by mouth at bedtime.   Past Week at Unknown time   vitamin B-12 (CYANOCOBALAMIN) 1000 MCG tablet Take 1,000 mcg by mouth daily.   Past Week at Unknown time   ondansetron (ZOFRAN) 8 MG tablet Take 1 tablet (8 mg total) by mouth 2 (two) times daily as needed for refractory nausea / vomiting. Start on day 3 after chemotherapy. (Patient taking differently: Take 2 mg by mouth 2 (two) times daily as needed for nausea (Start on day 3 after chemotherapy.).) 30 tablet 1 More than a month at Unknown time   prochlorperazine (COMPAZINE) 10 MG tablet Take 10 mg by mouth every 6 (six) hours as needed for nausea or vomiting.   More than a month at Unknown time    Current Facility-Administered Medications  Medication Dose Route Frequency Provider Last Rate Last Admin   bupivacaine liposome  (EXPAREL) 1.3 % injection 266 mg  20 mL Infiltration Once [name], MD       cefoTEtan (CEFOTAN) 2 g in sodium chloride 0.9 % 100 mL IVPB  2 g Intravenous On Call to OR [name], MD       Chlorhexidine Gluconate Cloth 2 % PADS 6 each  6 each Topical Once [name], MD       feeding supplement (ENSURE PRE-SURGERY) liquid 296 mL  296 mL Oral Once [name], MD       lactated ringers infusion   Intravenous Continuous Merlinda Frederick, MD 10 mL/hr at 05/25/20 5035 Continued from Pre-op at 05/25/20 4656   Facility-Administered Medications Ordered in Other Encounters  Medication Dose Route Frequency Provider Last Rate Last Admin   sodium chloride flush (NS) 0.9 % injection 10 mL  10 mL Intravenous PRN [name], MD   10 mL at 03/19/20 1015     No Known Allergies  BP (!) 164/83   Pulse 65   Temp 98 F (36.7 C) (Oral)   Resp 17   Ht 6\' 3"  (1.905 m)   Wt 112.9 kg   SpO2 93%   BMI 31.12 kg/m   Labs: No results found for this or any previous visit (from the past 48 hour(s)).  Imaging / Studies: No results found.   [name], M.D., F.A.C.S. Gastrointestinal and Minimally Invasive Surgery [facility] [address], [place], [place] [id number] [phone] Main / Paging  05/25/2020 7:29 AM    [name]

## 2020-05-25 NOTE — H&P (Signed)
Christopher Delacruz DOB: Mar 10, 1950  Patient Care Team: [name], MD as PCP - General (Family Medicine) Borders, Kirt Boys, NP as Nurse Practitioner (Hospice and Palliative Medicine) [name], MD as Consulting Physician (Oncology) [name], MD as Referring Physician (Radiation Oncology) [name], NP as Nurse Practitioner [name], RN as Oncology Nurse Navigator [name], MD as Consulting Physician (Neurology) [name], MD as Consulting Physician (Gastroenterology) [name], MD as Consulting Physician (Psychiatry) [name], MD as Consulting Physician (Colon and Rectal Surgery) [name], MD as Consulting Physician (Cardiology)  ` ` Patient sent for surgical consultation at the request of [name], [place]  Chief Complaint: rectal cancer   03/16/2020. Finished concurrent chemotherapy-Xeloda 1500 mg twice daily with radiation ` ` Patient comes today with his nephew that helps take care of him. He is feeling better. He did have some diarrhea with some urge incontinence around the time making radiation therapy. He hasn't had any accidents in a while. Still wearing diapers just in case but in the bathroom just fine. Going about twice a day. No other fiber supplement. Eating well. Denies any fevers chills. No prone eating. He was cleared by cardiology. Okay to hold his Plavix. Plan to do surgery 10 weeks from completion of chemoradiation therapy = late April. Patient denies pain. Appetite good. In good spirits. Patient's nephew feels like the patient is doing rather well. Patient still with some memory issues but no hemiparesis. In good spirits. Staying rather active. Not needing a wheelchair or walker at this point  PRIOR NOTE [name] 2021: The patient is a patient with numerous comorbidities. History of stroke with evidence of infarcts of left basal ganglion 2017. Had some right-sided  hemiparesis seems to have mostly resolved. However he has chronic cognitive impairment. Patient used to live by himself but now he is at assisted living at the [facility] in [place]. Nephew is involved & has healthcare power of attorney. Nephew here with patient. sounds like the patient had some issues with fall or other concerns and was fully anticoagulated on Plavix. Then admitted August 2021 for significant iron deficiency anemia. Also some abdominal discomfort and diarrhea causing some occasional overflow incontinence. Refer to gastroenterology. Patient underwent colonoscopy and found to have bulky mass 1020 centimeters from anal verge. 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 Port has been placed and patient started on FOLFOX chemotherapy. I believe believe the plan was for 8 cycles. Last note last week talks about getting cycle #7 going. patient denies any incontinence now. Does not smoke. He can walk 20 minutes without difficulty. Used to work in [employer] in environmental services. Claims to move his bowels a couple times a day now. Does have diabetes that is off all medications at this time. No sleep apnea. He's been on a blood thinner intermittently. Not certain if he is currently taking given the history of symptomatic anemia. Nephew pretty certain patient is back on Plavix. no prior abdominal surgery. Some history of BPH but no major urinary hesitancy or nocturia  (Review of systems as stated in this history (HPI) or in the review of systems. Otherwise all other 12 point ROS are negative) ` ` ###########################################`  This patient encounter took 65 minutes today to perform the following: obtain history, perform exam, review outside records, interpret tests & imaging, counsel the patient on their diagnosis; and, document  this encounter, including findings & plan in the electronic health record (EHR).   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 (TAKE TWO TABLETS AT 2 PM, 3 PM, AND 10 PM THE DAY PRIOR TO SURGERY) amLODIPine Besylate (10MG  Tablet, Oral) Active. Clopidogrel Bisulfate (75MG  Tablet, Oral) Active. Baby Aspirin (81MG  Tablet Chewable, Oral) Active. Finasteride (5MG  Tablet, Oral) Active. FeroSul (325 (65 Fe)MG Tablet, Oral) Active. Nystatin (100000 UNIT/ML Suspension, Mouth/Throat) Active. One A Day Mens VitaCraves (Oral) Active. Vitamin B12 (1000MCG Tablet ER, Oral) Active. Ondansetron (4MG  Tablet Disint, Oral) Active. Prochlorperazine Maleate (10MG  Tablet, Oral) Active. Medications Reconciled  Social History Christopher Hector, MD; 04/23/2020 4:11 PM) Alcohol use Occasional alcohol use. Caffeine use Tea. No drug use Tobacco use Former smoker. now in assisted living [facility]  now in assisted living [facility] [address], [place] i [phone]  Family History Christopher Hector, MD; 04/23/2020 4:11 PM) Cancer Brother. Diabetes Mellitus Brother,  Sister. Heart Disease Brother. Hypertension Brother.  Other Problems Christopher Hector, MD; 04/23/2020 4:11 PM) Cerebrovascular Accident Diabetes Mellitus High blood pressure Hypercholesterolemia Rectal Cancer     Review of Systems Christopher Hector, MD; 04/23/2020 4:12 PM) General Not Present- Appetite Loss, Chills, Fatigue, Fever, Night Sweats, Weight Gain and Weight Loss. Skin Not Present- Change in Wart/Mole, Dryness, Hives, Jaundice, New Lesions, Non-Healing Wounds, Rash and Ulcer. HEENT Not Present- Earache, Hearing Loss, Hoarseness, Nose Bleed, Oral Ulcers, Ringing in the Ears, Seasonal Allergies, Sinus Pain, Sore Throat, Visual Disturbances, Wears glasses/contact lenses and Yellow Eyes. Breast Not Present- Breast Mass, Breast Pain, Nipple Discharge and Skin Changes. Cardiovascular Not Present- Chest Pain, Difficulty Breathing Lying Down, Leg Cramps, Palpitations, Rapid Heart Rate, Shortness of Breath and Swelling of Extremities. Gastrointestinal Not Present- Abdominal Pain, Bloating, Bloody Stool, Change in Bowel Habits, Chronic diarrhea, Constipation, Difficulty Swallowing, Excessive gas, Gets full quickly at meals, Hemorrhoids, Indigestion, Nausea, Rectal Pain and Vomiting. Male Genitourinary Not Present- Blood in Urine, Change in Urinary Stream, Frequency, Impotence, Nocturia, Painful Urination, Urgency and Urine Leakage. Musculoskeletal Not Present- Back Pain, Joint Pain, Joint Stiffness, Muscle Pain, Muscle Weakness and Swelling of Extremities. Neurological Present- Decreased Memory. Not Present- Fainting, Headaches, Numbness, Seizures, Tingling, Tremor, Trouble walking and Weakness. Psychiatric Present- Anxiety. Not Present- Bipolar, Change in Sleep Pattern, Depression, Fearful and Frequent crying. Endocrine Not Present- Cold Intolerance, Excessive Hunger, Hair Changes, Heat Intolerance and New Diabetes. Hematology Present- Blood Thinners. Not Present- Easy Bruising,  Excessive bleeding, Gland problems, HIV and Persistent Infections.   Physical Exam Christopher Hector MD; 04/23/2020 4:31 PM)  General Mental Status-Alert. General Appearance-Not in acute distress, Not Sickly. Orientation-Oriented X3. Hydration-Well hydrated. Voice-Normal. Note: calm and relaxed. Sitting up smiling. No malnutrition or cachexia  Integumentary Global Assessment Upon inspection and palpation of skin surfaces of the - Axillae: non-tender, no inflammation or ulceration, no drainage. and Distribution of scalp and  body hair is normal. General Characteristics Temperature - normal warmth is noted.  Head and Neck Head-normocephalic, atraumatic with no lesions or palpable masses. Face Global Assessment - atraumatic, no absence of expression. Neck Global Assessment - no abnormal movements, no bruit auscultated on the right, no bruit auscultated on the left, no decreased range of motion, non-tender. Trachea-midline. Thyroid Gland Characteristics - non-tender.  Eye Eyeball - Left-Extraocular movements intact, No Nystagmus - Left. Eyeball - Right-Extraocular movements intact, No Nystagmus - Right. Cornea - Left-No Hazy - Left. Cornea - Right-No Hazy - Right. Sclera/Conjunctiva - Left-No scleral icterus, No Discharge - Left. Sclera/Conjunctiva - Right-No scleral icterus, No Discharge - Right. Pupil - Left-Direct reaction to light normal. Pupil - Right-Direct reaction to light normal.  ENMT Ears Pinna - Left - no drainage observed, no generalized tenderness observed. Pinna - Right - no drainage observed, no generalized tenderness observed. Nose and Sinuses External Inspection of the Nose - no destructive lesion observed. Inspection of the nares - Left - quiet respiration. Inspection of the nares - Right - quiet respiration. Mouth and Throat Lips - Upper Lip - no fissures observed, no pallor noted. Lower Lip - no fissures observed, no  pallor noted. Nasopharynx - no discharge present. Oral Cavity/Oropharynx - Tongue - no dryness observed. Oral Mucosa - no cyanosis observed. Hypopharynx - no evidence of airway distress observed.  Chest and Lung Exam Inspection Movements - Normal and Symmetrical. Accessory muscles - No use of accessory muscles in breathing. Palpation Palpation of the chest reveals - Non-tender. Auscultation Breath sounds - Normal and Clear.  Cardiovascular Auscultation Rhythm - Regular. Murmurs & Other Heart Sounds - Auscultation of the heart reveals - No Murmurs and No Systolic Clicks.  Abdomen Inspection Inspection of the abdomen reveals - No Visible peristalsis and No Abnormal pulsations. Umbilicus - No Bleeding, No Urine drainage. Palpation/Percussion Palpation and Percussion of the abdomen reveal - Soft, Non Tender, No Rebound tenderness, No Rigidity (guarding) and No Cutaneous hyperesthesia. Note: Abdomen soft. Nontender. Not distended. No umbilical or incisional hernias. No guarding.  Male Genitourinary Sexual Maturity Tanner 5 - Adult hair pattern and Adult penile size and shape. Note: No inguinal hernias. Normal external genitalia. Epididymi, testes, and spermatic cords normal without any masses.  Rectal Note: Held off on digital exam since had normal sphincter tone and has improvement in his continence  Peripheral Vascular Upper Extremity Inspection - Left - No Cyanotic nailbeds - Left, Not Ischemic. Inspection - Right - No Cyanotic nailbeds - Right, Not Ischemic.  Neurologic Neurologic evaluation reveals -normal attention span and ability to concentrate, able to name objects and repeat phrases. Appropriate fund of knowledge , normal sensation and normal coordination. Mental Status Affect - not angry, not paranoid. Cranial Nerves-Normal Bilaterally. Gait-Normal.  Neuropsychiatric Mental status exam performed with findings of-able to articulate well with  normal speech/language, rate, volume and coherence, thought content normal with ability to perform basic computations and apply abstract reasoning and no evidence of hallucinations, delusions, obsessions or homicidal/suicidal ideation. Note: poor memory recall. follows most commands been can get easily confused  Musculoskeletal Global Assessment Spine, Ribs and Pelvis - no instability, subluxation or laxity. Right Upper Extremity - no instability, subluxation or laxity.  Lymphatic Head & Neck General Head & Neck Lymphatics: Bilateral - Description - No Localized lymphadenopathy. Axillary General Axillary Region: Bilateral - Description - No Localized lymphadenopathy. Femoral & Inguinal Generalized Femoral & Inguinal Lymphatics: Left - Description - No Localized lymphadenopathy. Right - Description - No Localized  lymphadenopathy.    Assessment & Plan Christopher Hector MD; 04/23/2020 5:24 PM)  RECTAL ADENOCARCINOMA (C20) Story: bulky tumor involving at least proximal possible mid rectum  03/16/2020. Finished concurrent chemotherapy-Xeloda 1500 mg twice daily with radiation Impression: Bulking proximal rectal cancer with improved symptoms after chemoradiation therapy.  Plan robotic rectosigmoid on anterior resection 10 weeks after completion and chemotherapy. Date sent May 25, 2020.  Preoperative cystoscopy and firefly marking for the ureters.  Preoperative ostomy marking in the case diverting loop ileostomy versus permanent ostomy needed. Hopefully this is proximal enough that we can avoid any ostomy since he did not get total adjuvant. We will see. Patient would really like to avoid a permanent ostomy if possible.  Patient may require skilled facility postoperatively depending. Risk of dehydration higher and a demented patient with an ileostomy, so need to follow closely with a good bowel regimen. Hopefully ostomy clinic will be working well so we can send the  patient.  Cardiac clearance done. Okay to hold Plavix 5d preoperatively.  Current Plans Pt Education - [facility] Colorectal Cancer (AT): discussed with patient and provided information. The anatomy & physiology of the digestive tract was discussed. The pathophysiology of the rectal pathology was discussed. Natural history risks without surgery was discussed. I worked to give an overview of the disease and the frequent need to have multispecialty involvement. I feel the risks of no intervention will lead to serious problems that outweigh the operative risks; therefore, I recommended a partial proctocolectomy to remove the pathology. Minimally Invasive (Robotic/Laparoscopic) & open techniques were discussed. We will work to preserve anal & pelvic floor function without sacrificing cure.  Risks such as bleeding, infection, abscess, leak, reoperation, possible temporary or permanent ostomy, hernia, heart attack, death, and other risks were discussed. I noted a good likelihood this will help address the problem. Goals of post-operative recovery were discussed as well. We will work to minimize complications. Educational information was available as well. Questions were answered. The patient expresses understanding & wishes to proceed with surgery.   PREOP COLON - ENCOUNTER FOR PREOPERATIVE EXAMINATION FOR GENERAL SURGICAL PROCEDURE (Z01.818)  Current Plans You are being scheduled for surgery- Our schedulers will call you.  You should hear from our office's scheduling department within 5 working days about the location, date, and time of surgery. We try to make accommodations for patient's preferences in scheduling surgery, but sometimes the OR schedule or the surgeon's schedule prevents [place] from making those accommodations.  If you have not heard from our office [phone]) in 5 working days, call the office and ask for your surgeon's nurse.  If you have other questions about your  diagnosis, plan, or surgery, call the office and ask for your surgeon's nurse.  Written instructions provided Pt Education - [facility] Colon Bowel Prep 2018 ERAS/Miralax/Antibiotics Continued Neomycin Sulfate 500 MG Oral Tablet, 2 (two) Tablet SEE NOTE, #6, 04/23/2020, No Refill. Local Order: Pharmacist Notes: TAKE TWO TABLETS AT 2 PM, 3 PM, AND 10 PM THE DAY PRIOR TO SURGERY Restarted metroNIDAZOLE 500 MG Oral Tablet, 2 (two) Tablet three times daily, #6, 1 day starting 04/23/2020, No Refill. Local Order: Pharmacist Notes: Pharmacy Instructions: Take 2 tablets at 2pm, 3pm, and 10pm the day prior to your colon operation.  ANTICOAGULATED (Z79.01) Impression: anesthesia coagulated on Plavix skin history of stroke infarct 2017. Was recently held due to symptomatically anemia from his rectal cancer.  The patient seems to have pretty good performance status but I think it would be wise  to get cardiac clearance to assess operative risks  Current Plans Pt Education - [facility] Hold anticoagulation preoperatively  HISTORY OF STROKE (Z86.73)   COGNITIVE IMPAIRMENT (R41.89) Impression: history of significant cognitive impairment and poor memory recall most likely related to his stroke.  now in assisted living [facility], [place] i [phone]   Alanson Puls has become healthcare power of attorney.RESPONSIBLE PARTY:Mark Iovino, nephew/HCPOA [phone]   FUNCTIONAL FECAL INCONTINENCE (R15.9) Impression: episodes of fecal incontinence that seem to be related to his urgency diarrhea from his partially obstructing rectosigmoid cancer   STAGE 3A CHRONIC KIDNEY DISEASE (N18.31)   DM (DIABETES MELLITUS), TYPE 2 WITH COMPLICATIONS (V87.2)    Christopher Hector, MD, FACS, MASCRS  Gastrointestinal and Minimally Invasive Surgery  [facility] [address], [place], [place] [address] [phone] Fax [phone]  Main/Paging  CONTACT INFORMATION: Weekday (9AM-5PM) concerns: Call [facility] main office at [phone] Weeknight (5PM-9AM) or Weekend/Holiday concerns: Check [email or web address] for General Surgery [facility] coverage (Please, do not use SecureChat as it is not reliable communication to operating surgeons for immediate patient care)

## 2020-05-25 NOTE — Op Note (Signed)
05/25/2020  1:39 PM  PATIENT:  Christopher Delacruz  70 y.o. male  Patient Care Team: [name], MD as PCP - General (Family Medicine) Borders, Kirt Boys, NP as Nurse Practitioner (Hospice and Palliative Medicine) [name], MD as Consulting Physician (Oncology) [name], MD as Referring Physician (Radiation Oncology) [name], NP as Nurse Practitioner [name], RN as Oncology Nurse Navigator [name], MD as Consulting Physician (Neurology) [name], MD as Consulting Physician (Gastroenterology) [name], MD as Consulting Physician (Psychiatry) [name], MD as Consulting Physician (Colon and Rectal Surgery) [name], MD as Consulting Physician (Cardiology)  PRE-OPERATIVE DIAGNOSIS:  RECTAL CANCER  POST-OPERATIVE DIAGNOSIS:  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  This assistant was needed for exposure, dissection, suction, tissue approximation, retraction, perception, etc.  ANESTHESIA:     General  Nerve block provided with liposomal bupivacaine (Experel) mixed with 0.25% bupivacaine as a Bilateral TAP block x 54mL each side at the level of the transverse abdominis & preperitoneal spaces along the flank at the anterior axillary line, from subcostal ridge to iliac crest under laparoscopic guidance   Local field block at port sites & extraction wound  EBL:  Total I/O In: 3700 [I.V.:3700] Out: 200 [Urine:100; Blood:100]  Delay start of Pharmacological  VTE agent (>24hrs) due to surgical blood loss or risk of bleeding:  no  DRAINS: 19 Fr Blake drain goes to the pelvis  SPECIMEN:   LEFT COLON & RECTUM (open end proximal) DISTAL ANASTOMOTIC RING (final distal margin)  DISPOSITION OF SPECIMEN:  PATHOLOGY  COUNTS:  YES  PLAN OF CARE: Admit to inpatient   PATIENT DISPOSITION:  PACU - hemodynamically stable.  INDICATION:    Patient with obstructing proximal rectal cancer underwent neoadjuvant chemoradiation therapy since extension going down into mid rectum.  Stabilized.  Underwent cardiac clearance.  Has some mild dementia but otherwise good functional status.  Patient and family wish to be aggressive.  I recommended segmental resection:  The anatomy & physiology of the digestive tract was discussed.  The pathophysiology was discussed.  Natural history risks without surgery was discussed.   I worked to give an overview of the disease and the frequent need to have multispecialty involvement.  I feel the risks of no intervention will lead to serious problems that outweigh the operative risks; therefore, I recommended a partial colectomy to remove the pathology.  Laparoscopic & open techniques were discussed.   Risks such as bleeding, infection, abscess, leak, reoperation, possible ostomy, hernia, heart attack, death, and other risks were discussed.  I noted a good likelihood this will help address the problem.   Goals of post-operative recovery were discussed as well.  We will work to minimize complications.  Educational materials on the pathology had been given in the office.  Questions were answered.    The patient expressed understanding & wished to proceed with surgery.  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  Some radiation response.  Despite the patient saying he did his bowel prep,  he did not.  No obvious metastatic disease on visceral parietal peritoneum or liver.  The  anastomosis rests 8 cm from the anal verge by rigid proctoscopy.  CASE DATA:  Type of patient?: Elective [facility] Private Case  Status of Case? Elective Scheduled  Infection Present At Time Of Surgery (PATOS)?  NO  DESCRIPTION:   Informed consent was confirmed.  The patient underwent general anaesthesia without difficulty.  The patient was positioned appropriately.  VTE prevention in place.  The patient was clipped, prepped, & draped in a sterile fashion.  Surgical timeout confirmed our plan.  The patient was positioned in reverse Trendelenburg.  Abdominal entry was gained using Varess technique at the left subcostal ridge on the anterior abdominal wall.  No elevated EtCO2 noted.  Port placed.  Camera inspection revealed no injury.  Extra ports were carefully placed under direct laparoscopic visualization.  I reflected the greater omentum and the upper abdomen the small bowel in the upper abdomen.  The patient was carefully positioned.  The [employer] daVinci robot was docked with camera & instruments carefully placed.  The patient had a tattoo rectosigmoid junction with dilated colon.  Very suspicious for retained stool.  He had inflammation and stricturing at the peritoneal reflection.  I mobilized the rectosigmoid colon & elevated it to put the main pedicle on tension.  I scored the base of peritoneum of the medial side of the mesentery of the elevated left colon from the ligament of Treitz to the mid rectum.   I elevated the sigmoid mesentery and entered into the retro-mesenteric plane. We were able to identify the left ureter and gonadal vessels. We kept those posterior within the retroperitoneum and elevated the left colon mesentery off that. I did isolate the inferior mesenteric artery (IMA) pedicle but did not ligate it yet.  I continued distally and got into the avascular plane posterior to the mesorectum, sparing the nervi ergentes.   I had to digitally disimpact his rectum to get all stool out  distal to the mid rectal stricture.  That allowed mesorectal dissection to go more easily.   I skeletonized the lymph nodes off the inferior mesenteric artery pedicle.  I went down to its takeoff from the aorta.   I isolated the inferior mesenteric vein off of the ligament of Treitz just cephalad to that as well.  After confirming the left ureter was out of the way, I went ahead and ligated the inferior mesenteric artery pedicle just near its takeoff from the aorta.  I did ligate the inferior mesenteric vein in a similar fashion.  We ensured hemostasis.  I freed the left colon mesentery off the retroperitoneum including over Gerota's fascia up towards the splenic flexure.  I mobilized the peritoneal coverings towards the peritoneal reflection on both the right and left sides of the rectum.  I skeletonized the mesorectum at the junction at the proximal rectum for the distal point of resection.  I mobilized the left colon in a lateral to medial fashion off the retroperitoneum and sidewall attachments along the line of Toldt up towards the splenic flexure to ensure good mobilization of the remaining left colon to reach into the pelvis. I chose a region at the descending/sigmoid junction that was soft and easily reached down to the rectal stump.   Then turned attention to mesorectal dissection.  Fibrosis and edema and in the pelvis consistent with radiation exposure.  Eventually freed the mesorectum off the presacral plane and was able to come around laterally.  I came across the anterior peritoneal reflection which was quite thickened and fibrotic.  Countered very large seminal vesicles and freed the anterior rectal wall off of them and came down to the level of the prostate.  I then did rigid proctoscopy after some more disimpaction to confirm the stricture at about 9 to 10 cm from the anal verge and the rest the mucosa soft and healthy.  Distal tattooing a couple centimeters distal.  Felt like we had decent  margins.  Went back robotically and skeletonized and transected through the mesorectum to the side of the mid rectum that we felt will be safe for margins.  I decided to go over a 2 cm margin given the neoadjuvantly ration therapy and advanced age and not wishing to avoid a very distal anastomosis that would require ileal diversion or end colostomy.  Returned attention back to the descending colon.  I transected the mesentery of the colon radially to preserve remaining colon blood supply.  We asked anesthesia to dilute the indocyanine green (ICG) to 10 mL and inject 3 mL intravenously with IV flush.  I switched to the NIR fluorescence (Firefly mode) imaging window on the daVinci platform.  I was able to see good light green visualization of blood vessels with good perfusion of tissues.  Rectal stump looked viable.  However the left colon did not perfuse well.  He had some delayed filling.  His blood pressure was somewhat low.  However it did get decent immunofluorescent to about the splenic flexure of the colon.  That was concerning that we needed to go more proximally.  Even though we did focused on high ligation to preserve left-sided collaterals it seemed apparent that he did not have good collateral blood supply to his descending colon at all.  Therefore we focused on mobilization of splenic flexure of the colon.  Reposition the patient in person [place].  I freed the greater omentum off the mid transverse colon and eventually got into the lesser sac.  He had some rather dense omental adhesions to the splenic flexure  Was able to free all the great omentum off distally and including the splenic flexure.  With that I could find the inferior pancreatic edge and transect the transverse colon mesentery off it and get into the prior retroperitoneal dissection that I had done medial to lateral.  With this I can elevate the distal transverse colon and splenic flexure mesentery off the retroperitoneum.  The  splenic flexure was rather thickened and densely adherent to the spleen and retroperitoneum so this took some time.  Eventually was able to transect mesentery to a good dominant middle colic pedicle.  With this we had complete mobilization of the splenic flexure of the colon.  This allowed much better mobilization.  We repeated tissue perfusion with firefly and found an area at the splenic flexure that had good perfusion.  I transected through the left colon mesentery taking care to try and preserve as much mesentery proximal to the inferior mesenteric pedicle.  Skeletonized along the descending colon to preserve as much collaterals as possible since he seems to not have standard collateral blood supply.  I skeletonized the mesorectum and transected at the proximal rectum using a robotic stapler.  His rectum was very thickened and edematous from his radiation so this took a few firings.  I created an extraction incision through a small Pfannenstiel incision in the suprapubic region.  Placed a wound protector.  I was able to eviscerate the rectosigmoid  and descending colon out the wound.   I clamped the colon proximal to this area using a reusable pursestringer device.  Passed a 2-0 Keith needle. I transected at the descending/sigmoid junction with a scalpel. I got healthy bleeding mucosa.  We sent the rectosigmoid colon specimen off to go to pathology.  We sized the colon orifice.  I chose a 72mm EEA anvil stapler system.  I reinforced the prolene pursestring with interrupted silk suture.  I placed the anvil to the open end of the proximal remaining colon and closed around it using the pursestring.    We did copious irrigation with crystalloid solution.  Hemostasis was good.  The distal end of the remaining colon easily reached down to the rectal stump.     Dr Dema Severin scrubbed down and did gentle anal dilation and advanced the EEA stapler up the rectal stump. The spike was brought out at the provimal end of the  rectal stump under direct visualization.  I  attached the anvil of the proximal colon the spike of the stapler. Anvil was tightened down and held clamped for 60 seconds. The EEA stapler was fired and held clamped for 30 seconds. The stapler was released & removed. We noted 2 excellent anastomotic rings. Blue stitch is in the proximal ring.  Dr Dema Severin  did rigid proctoscopy noted the anastomosis was at 7-8 cm from the anal verge consistent with the mid rectum.  We irrigation of isotonic solution & held that for the pelvic air leak test .  The rectum was insufflated the rectum while clamping the colon proximal to that anastomosis.  There was a negative air leak test. There was no tension of mesentery or bowel at the anastomosis.   Tissues looked viable.  Ureters & bowel uninjured.  The anastomosis looked healthy. Greater omentum positioned down into the pelvis to help protect the anastomosis.  Endoluminal gas was evacuated.  Ports & wound protector removed.  We changed gloves & redraped the patient per colon SSI prevention protocol.  We aspirated the antibiotic irrigation.  Hemostasis was good.  Sterile unused instruments were used from this point.  I closed the skin at the port sites using Monocryl stitch and sterile dressing.  I closed the extraction wound using a 0 Vicryl vertical peritoneal closure and a #1 PDS transverse anterior rectal fascial closure like a small Pfannenstiel closure. I closed the skin with some interrupted Monocryl stitches.   Patient is being extubated go to recovery room. I had discussed postop care with the patient in detail the office & in the holding area. Instructions are written. I discussed operative findings, updated the patient's status, discussed probable steps to recovery, and gave postoperative recommendations to the patient's nephew, [name].  Recommendations were made.  Questions were answered.  He expressed understanding & appreciation.  [name], M.D.,  F.A.C.S. Gastrointestinal and Minimally Invasive Surgery [facility] [address], [place], [place] [address] [phone] Main / Paging

## 2020-05-25 NOTE — Anesthesia Postprocedure Evaluation (Signed)
Anesthesia Post Note  Patient: Christopher Delacruz  Procedure(s) Performed: XI ROBOTIC ASSISTED LOWER ANTERIOR RECTOSIGMOID RESECTION WITH MOBILIZATION OF THE SPENIC FLEXURE, BILATERAL TAP BLOCK AND INTRAOPERATIVE ASSESMENT OF PERFUSION (N/A Abdomen) RIGID PROCTOSCOPY (N/A Rectum) CYSTOSCOPY WITH FIREFLY INJECTIONS AND BILATERAL OPEN ENDED CATHETER PLACEMENT (N/A Bladder)     Patient location during evaluation: PACU Anesthesia Type: General Level of consciousness: awake Pain management: pain level controlled Vital Signs Assessment: post-procedure vital signs reviewed and stable Respiratory status: spontaneous breathing, respiratory function stable and patient connected to nasal cannula oxygen Cardiovascular status: stable Postop Assessment: no apparent nausea or vomiting Anesthetic complications: no   No complications documented.  Last Vitals:  Vitals:   05/25/20 1713 05/25/20 1808  BP: 134/77 129/67  Pulse: 79 80  Resp: 15 18  Temp: (!) 36.4 C 36.9 C  SpO2: 92% 94%    Last Pain:  Vitals:   05/25/20 1713  TempSrc: Oral  PainSc:                  Merlinda Frederick

## 2020-05-25 NOTE — Discharge Instructions (Signed)
SURGERY: POST OP INSTRUCTIONS (Surgery for small bowel obstruction, colon resection, etc)   ######################################################################  EAT Gradually transition to a high fiber diet with a fiber supplement over the next few days after discharge  WALK Walk an hour a day.  Control your pain to do that.    CONTROL PAIN Control pain so that you can walk, sleep, tolerate sneezing/coughing, go up/down stairs.  HAVE A BOWEL MOVEMENT DAILY Keep your bowels regular to avoid problems.  OK to try a laxative to override constipation.  OK to use an antidairrheal to slow down diarrhea.  Call if not better after 2 tries  CALL IF YOU HAVE PROBLEMS/CONCERNS Call if you are still struggling despite following these instructions. Call if you have concerns not answered by these instructions  ######################################################################   DIET Follow a light diet the first few days at home.  Start with a bland diet such as soups, liquids, starchy foods, low fat foods, etc.  If you feel full, bloated, or constipated, stay on a ful liquid or pureed/blenderized diet for a few days until you feel better and no longer constipated. Be sure to drink plenty of fluids every day to avoid getting dehydrated (feeling dizzy, not urinating, etc.). Gradually add a fiber supplement to your diet over the next week.  Gradually get back to a regular solid diet.  Avoid fast food or heavy meals the first week as you are more likely to get nauseated. It is expected for your digestive tract to need a few months to get back to normal.  It is common for your bowel movements and stools to be irregular.  You will have occasional bloating and cramping that should eventually fade away.  Until you are eating solid food normally, off all pain medications, and back to regular activities; your bowels will not be normal. Focus on eating a low-fat, high fiber diet the rest of your life  (See Getting to [place], below).  CARE of your INCISION or WOUND It is good for closed incision and even open wounds to be washed every day.  Shower every day.  Short baths are fine.  Wash the incisions and wounds clean with soap & water.     If you have a closed incision(s), wash the incision with soap & water every day.  You may leave closed incisions open to air if it is dry.   You may cover the incision with clean gauze & replace it after your daily shower for comfort.  It is good for closed incisions and even open wounds to be washed every day.  Shower every day.  Short baths are fine.  Wash the incisions and wounds clean with soap & water.    You may leave closed incisions open to air if it is dry.   You may cover the incision with clean gauze & replace it after your daily shower for comfort.  TEGADERM:  You have clear gauze band-aid dressings over your closed incision(s).  Remove the dressings 3 days after surgery.  If you have an open wound with a wound vac, see wound vac care instructions.     ACTIVITIES as tolerated Start light daily activities --- self-care, walking, climbing stairs-- beginning the day after surgery.  Gradually increase activities as tolerated.  Control your pain to be active.  Stop when you are tired.  Ideally, walk several times a day, eventually an hour a day.   Most people are back to most day-to-day activities in a  few weeks.  It takes 4-8 weeks to get back to unrestricted, intense activity. If you can walk 30 minutes without difficulty, it is safe to try more intense activity such as jogging, treadmill, bicycling, low-impact aerobics, swimming, etc. Save the most intensive and strenuous activity for last (Usually 4-8 weeks after surgery) such as sit-ups, heavy lifting, contact sports, etc.  Refrain from any intense heavy lifting or straining until you are off narcotics for pain control.  You will have off days, but things should improve  week-by-week. DO NOT PUSH THROUGH PAIN.  Let pain be your guide: If it hurts to do something, don't do it.  Pain is your body warning you to avoid that activity for another week until the pain goes down. You may drive when you are no longer taking narcotic prescription pain medication, you can comfortably wear a seatbelt, and you can safely make sudden turns/stops to protect yourself without hesitating due to pain. You may have sexual intercourse when it is comfortable. If it hurts to do something, stop.  MEDICATIONS Take your usually prescribed home medications unless otherwise directed.   Blood thinners:  Usually you can restart any strong blood thinners after the second postoperative day.  It is OK to take aspirin right away.     If you are on strong blood thinners (warfarin/Coumadin, Plavix, Xerelto, Eliquis, Pradaxa, etc), discuss with your surgeon, medicine PCP, and/or cardiologist for instructions on when to restart the blood thinner & if blood monitoring is needed (PT/INR blood check, etc).     PAIN CONTROL Pain after surgery or related to activity is often due to strain/injury to muscle, tendon, nerves and/or incisions.  This pain is usually short-term and will improve in a few months.  To help speed the process of healing and to get back to regular activity more quickly, DO THE FOLLOWING THINGS TOGETHER: 1. Increase activity gradually.  DO NOT PUSH THROUGH PAIN 2. Use Ice and/or Heat 3. Try Gentle Massage and/or Stretching 4. Take over the counter pain medication 5. Take Narcotic prescription pain medication for more severe pain  Good pain control = faster recovery.  It is better to take more medicine to be more active than to stay in bed all day to avoid medications. 1.  Increase activity gradually Avoid heavy lifting at first, then increase to lifting as tolerated over the next 6 weeks. Do not "push through" the pain.  Listen to your body and avoid positions and maneuvers than  reproduce the pain.  Wait a few days before trying something more intense Walking an hour a day is encouraged to help your body recover faster and more safely.  Start slowly and stop when getting sore.  If you can walk 30 minutes without stopping or pain, you can try more intense activity (running, jogging, aerobics, cycling, swimming, treadmill, sex, sports, weightlifting, etc.) Remember: If it hurts to do it, then don't do it! 2. Use Ice and/or Heat You will have swelling and bruising around the incisions.  This will take several weeks to resolve. Ice packs or heating pads (6-8 times a day, 30-60 minutes at a time) will help sooth soreness & bruising. Some people prefer to use ice alone, heat alone, or alternate between ice & heat.  Experiment and see what works best for you.  Consider trying ice for the first few days to help decrease swelling and bruising; then, switch to heat to help relax sore spots and speed recovery. Shower every day.  Short baths  are fine.  It feels good!  Keep the incisions and wounds clean with soap & water.   3. Try Gentle Massage and/or Stretching Massage at the area of pain many times a day Stop if you feel pain - do not overdo it 4. Take over the counter pain medication This helps the muscle and nerve tissues become less irritable and calm down faster Choose ONE of the following over-the-counter anti-inflammatory medications: Acetaminophen 565m tabs (Tylenol) 1-2 pills with every meal and just before bedtime (avoid if you have liver problems or if you have acetaminophen in you narcotic prescription) Naproxen 2248mtabs (ex. Aleve, Naprosyn) 1-2 pills twice a day (avoid if you have kidney, stomach, IBD, or bleeding problems) Ibuprofen 20030mabs (ex. Advil, Motrin) 3-4 pills with every meal and just before bedtime (avoid if you have kidney, stomach, IBD, or bleeding problems) Take with food/snack several times a day as directed for at least 2 weeks to help keep pain /  soreness down & more manageable. 5. Take Narcotic prescription pain medication for more severe pain A prescription for strong pain control is often given to you upon discharge (for example: oxycodone/Percocet, hydrocodone/Norco/Vicodin, or tramadol/Ultram) Take your pain medication as prescribed. Be mindful that most narcotic prescriptions contain Tylenol (acetaminophen) as well - avoid taking too much Tylenol. If you are having problems/concerns with the prescription medicine (does not control pain, nausea, vomiting, rash, itching, etc.), please call us [phone] see if we need to switch you to a different pain medicine that will work better for you and/or control your side effects better. If you need a refill on your pain medication, you must call the office before 4 pm and on weekdays only.  By federal law, prescriptions for narcotics cannot be called into a pharmacy.  They must be filled out on paper & picked up from our office by the patient or authorized caretaker.  Prescriptions cannot be filled after 4 pm nor on weekends.    WHEN TO CALL US [phone] uncontrolled or worsening pain  Fever over 101 F (38.5 C) Concerns with the incision: Worsening pain, redness, rash/hives, swelling, bleeding, or drainage Reactions / problems with new medications (itching, rash, hives, nausea, etc.) Nausea and/or vomiting Difficulty urinating Difficulty breathing Worsening fatigue, dizziness, lightheadedness, blurred vision Other concerns If you are not getting better after two weeks or are noticing you are getting worse, contact our office [phone] for further advice.  We may need to adjust your medications, re-evaluate you in the office, send you to the emergency room, or see what other things we can do to help. The clinic staff is available to answer your questions during regular business hours (8:30am-5pm).  Please don't hesitate to call and ask to speak to one of our nurses for  clinical concerns.    A surgeon from [facility] is always on call at the hospitals 24 hours/day If you have a medical emergency, go to the nearest emergency room or call 911.  FOLLOW UP in our office One the day of your discharge from the hospital (or the next business weekday), please call [facility] to set up or confirm an appointment to see your surgeon in the office for a follow-up appointment.  Usually it is 2-3 weeks after your surgery.   If you have skin staples at your incision(s), let the office know so we can set up a time in the office for the nurse to remove them (usually around  10 days after surgery). Make sure that you call for appointments the day of discharge (or the next business weekday) from the hospital to ensure a convenient appointment time. IF YOU HAVE DISABILITY OR FAMILY LEAVE FORMS, BRING THEM TO THE OFFICE FOR PROCESSING.  DO NOT GIVE THEM TO YOUR DOCTOR.  [facility] [address], [place], [place], [place]  [address] ? [phone] - Main [phone] - [place],  [phone] - Fax [email or web address]  GETTING TO GOOD BOWEL HEALTH. It is expected for your digestive tract to need a few months to get back to normal.  It is common for your bowel movements and stools to be irregular.  You will have occasional bloating and cramping that should eventually fade away.  Until you are eating solid food normally, off all pain medications, and back to regular activities; your bowels will not be normal.   Avoiding constipation The goal: ONE SOFT BOWEL MOVEMENT A DAY!    Drink plenty of fluids.  Choose water first. TAKE A FIBER SUPPLEMENT EVERY DAY THE REST OF YOUR LIFE During your first week back home, gradually add back a fiber supplement every day Experiment which form you can tolerate.   There are many forms such as powders, tablets, wafers, gummies, etc Psyllium bran (Metamucil), methylcellulose  (Citrucel), Miralax or Glycolax, Benefiber, Flax Seed.  Adjust the dose week-by-week (1/2 dose/day to 6 doses a day) until you are moving your bowels 1-2 times a day.  Cut back the dose or try a different fiber product if it is giving you problems such as diarrhea or bloating. Sometimes a laxative is needed to help jump-start bowels if constipated until the fiber supplement can help regulate your bowels.  If you are tolerating eating & you are farting, it is okay to try a gentle laxative such as double dose MiraLax, prune juice, or Milk of Magnesia.  Avoid using laxatives too often. Stool softeners can sometimes help counteract the constipating effects of narcotic pain medicines.  It can also cause diarrhea, so avoid using for too long. If you are still constipated despite taking fiber daily, eating solids, and a few doses of laxatives, call our office. Controlling diarrhea Try drinking liquids and eating bland foods for a few days to avoid stressing your intestines further. Avoid dairy products (especially milk & ice cream) for a short time.  The intestines often can lose the ability to digest lactose when stressed. Avoid foods that cause gassiness or bloating.  Typical foods include beans and other legumes, cabbage, broccoli, and dairy foods.  Avoid greasy, spicy, fast foods.  Every person has some sensitivity to other foods, so listen to your body and avoid those foods that trigger problems for you. Probiotics (such as active yogurt, Align, etc) may help repopulate the intestines and colon with normal bacteria and calm down a sensitive digestive tract Adding a fiber supplement gradually can help thicken stools by absorbing excess fluid and retrain the intestines to act more normally.  Slowly increase the dose over a few weeks.  Too much fiber too soon can backfire and cause cramping & bloating. It is okay to try and slow down diarrhea with a few doses of antidiarrheal medicines.   Bismuth  subsalicylate (ex. Kayopectate, Pepto Bismol) for a few doses can help control diarrhea.  Avoid if pregnant.   Loperamide (Imodium) can slow down diarrhea.  Start with one tablet (71m) first.  Avoid if you are having fevers or severe pain.  ILEOSTOMY PATIENTS WILL HAVE CHRONIC DIARRHEA since their colon is not in use.    Drink plenty of liquids.  You will need to drink even more glasses of water/liquid a day to avoid getting dehydrated. Record output from your ileostomy.  Expect to empty the bag every 3-4 hours at first.  Most people with a permanent ileostomy empty their bag 4-6 times at the least.   Use antidiarrheal medicine (especially Imodium) several times a day to avoid getting dehydrated.  Start with a dose at bedtime & breakfast.  Adjust up or down as needed.  Increase antidiarrheal medications as directed to avoid emptying the bag more than 8 times a day (every 3 hours). Work with your wound ostomy nurse to learn care for your ostomy.  See ostomy care instructions. TROUBLESHOOTING IRREGULAR BOWELS 1) Start with a soft & bland diet. No spicy, greasy, or fried foods.  2) Avoid gluten/wheat or dairy products from diet to see if symptoms improve. 3) Miralax 17gm or flax seed mixed in [place]. water or juice-daily. May use 2-4 times a day as needed. 4) Gas-X, Phazyme, etc. as needed for gas & bloating.  5) Prilosec (omeprazole) over-the-counter as needed 6)  Consider probiotics (Align, Activa, etc) to help calm the bowels down  Call your doctor if you are getting worse or not getting better.  Sometimes further testing (cultures, endoscopy, X-ray studies, CT scans, bloodwork, etc.) may be needed to help diagnose and treat the cause of the diarrhea. [facility], [place], [place], [place], [place]  [address] [phone] - Main.    [phone]  - Toll Free.   [phone] - Fax [email or web address]    Colorectal Cancer  Colorectal cancer is a  cancerous (malignant) tumor in the colon or rectum, which are parts of the large intestine. A tumor is a mass of cells or tissue. The cancer can spread (metastasize) to other parts of the body. What are the causes? This condition is usually caused by abnormal growths called polyps on the inner wall of the colon or rectum. Left untreated, these polyps can develop into cancer. Other times, abnormal changes to genes (gene mutations) can cause cells to become cancerous. What increases the risk? The following factors may make you more likely to develop this condition:  Being older than age 101.  Having a personal or family history of colorectal cancer or polyps in your colon.  Having diabetes, or having had cancer and cancer treatments such as radiation before.  Having certain hereditary conditions, such as: ? Lynch syndrome. ? Familial adenomatous polyposis. ? Turcot syndrome. ? Peutz-Jeghers syndrome. ? MUTYH-associated polyposis (MAP).  Being overweight or obese.  Having a diet that is: ? High in red meats, such as beef, pork, lamb, or liver. ? High in precooked, cured, or other processed meat, such as sausages, meat loaves, and hot dogs. ? Low in fiber, such as fiber found in whole grains, fruits, and vegetables.  Being inactive (sedentary), smoking, or drinking too much alcohol.  Having an inflammatory bowel disease, such as ulcerative colitis or Crohn's disease. What are the signs or symptoms? Early colorectal cancer often does not cause symptoms. As the cancer grows, symptoms may include:  Changes in bowel habits.  Feeling like the bowel does not empty completely after a bowel movement.  Stools (feces) that are narrower than usual, or blood in the stool or toilet after a bowel movement. The blood may be bright red or very dark in color.  Diarrhea, constipation, or frequent gas pain.  Anemia, constant tiredness (fatigue), or nausea and vomiting.  Discomfort, pain, bloating,  fullness, or cramps in the abdomen.  Unexplained weight loss. How is this diagnosed? This condition may be diagnosed with:  A medical history.  A physical exam.  Tests. These may include: ? An exam of the rectum using a gloved finger (digital rectal exam). ? A stool test called a fecal occult blood test. ? Blood tests. ? A biopsy. This is removal of a tissue sample from the colon or rectum to be looked at under a microscope. You may also have other tests, including:  X-rays, CT scans, MRIs, or a PET scan.  A sigmoidoscopy. This test is done to view the inside of the rectum.  A colonoscopy. This test is done to view the inside of the colon. During this test, small polyps can be removed or biopsies may be taken.  An endorectal ultrasound. This test checks how deep a tumor in the rectum has grown and whether the cancer has spread to lymph nodes or other nearby tissues. Additional tests may be done to find out whether the cancer has spread to other parts of the body (what stage it is). The stages of cancer include:  Stage 0 - At this stage, the cancer is found only in the innermost lining of the colon or rectum. The tumor has not spread to other tissue.  Stage 1 (I) - At this stage, the cancer has grown into the inner wall (muscle layer) of the colon or rectum.  Stage 2 (II) - At this stage, the cancer has grown more deeply into the wall of the colon or rectum or through the wall. It may have invaded nearby tissue or organs.  Stage 3 (III) - At this stage, the cancer has spread to nearby lymph nodes or tissue near the lymph nodes.  Stage 4 (IV) - At this stage, the cancer has spread to other parts of the body that are not near the colon, such as the liver or lungs. How is this treated? Treatment for this condition depends on the type and stage of the cancer. Treatment may include:  Surgery. In the early stages of the cancer, surgery may be done to remove polyps or small tumors from  the colon. In later stages, surgery may be done to remove part of the colon (partial colectomy).  Chemotherapy. This treatment uses medicines to kill cancer cells.  Targeted therapy. This treatment can kill tumor cells by targeting specific gene mutations or proteins that the cancer expresses.  Immunotherapy (biologic therapy). This treatment uses your body's disease-fighting system (immune system) to fight the cancer. Substances made by your body or in a laboratory are used to boost, direct, or restore your body's natural defenses against cancer.  Radiation therapy. This treatment uses radiation to kill cancer cells or shrink tumors.  Radiofrequency ablation. This treatment uses radio waves to destroy the tumors that may have spread to other areas of the body, such as the liver. Follow these instructions at home:  Take over-the-counter and prescription medicines only as told by your health care provider.  Try to eat regular, healthy meals. Some of your treatments might affect your appetite. Ask to meet with a dietitian if you are having problems eating or with your appetite.  Consider joining a support group. This may help you learn about your diagnosis and manage the stress of having colorectal cancer.  If you are admitted to the hospital,  tell your cancer care team.  Keep all follow-up visits. This is important. How is this prevented?  Colorectal cancer can be prevented with screening tests that find polyps so they can be removed before they develop into cancer.  All adults should have screening for colorectal cancer starting at age 19 and continuing until age 20. Your health care provider may recommend screening before age 35. People at increased risk should start screening at an earlier age.  You may be able to help reduce your risk of developing colorectal cancer by staying at a healthy weight, eating a healthy diet, avoiding tobacco and alcohol use, and being physically  active. Where to find more information  American Cancer Society: cancer.[place] cancer.gov Contact a health care provider if:  Your diarrhea or constipation does not go away.  You have blood in your stool or in the toilet after a bowel movement.  Your bowel habits change.  You have increased pain in your abdomen.  You notice new fatigue or weakness.  You lose weight without a known reason. Get help right away if:  You have increased bleeding from the rectum.  You have any uncontrollable or severe abdominal symptoms. Summary  Colorectal cancer is a cancerous (malignant) tumor in the colon or rectum, which are parts of the large intestine.  Common risk factors for this condition include being older than age 90, having a personal or family history of colorectal cancer or colon polyps, having certain hereditary conditions, or having conditions such as diabetes or inflammatory bowel disease.  This condition may be diagnosed with tests, such as a colonoscopy and biopsy.  Treatment depends on the type and stage of the cancer. Often, treatment includes surgery to remove the abnormal tissue, along with chemotherapy, targeted therapy, or immunotherapy.  Keep all follow-up visits. This is important. This information is not intended to replace advice given to you by your health care provider. Make sure you discuss any questions you have with your health care provider. Document Revised: 05/04/2019 Document Reviewed: 05/04/2019 Elsevier Patient Education  2021 [employer].

## 2020-05-25 NOTE — Transfer of Care (Signed)
Immediate Anesthesia Transfer of Care Note  Patient: Christopher Delacruz  Procedure(s) Performed: XI ROBOTIC ASSISTED LOWER ANTERIOR RECTOSIGMOID RESECTION WITH MOBILIZATION OF THE SPENIC FLEXURE, BILATERAL TAP BLOCK AND INTRAOPERATIVE ASSESMENT OF PERFUSION (N/A Abdomen) RIGID PROCTOSCOPY (N/A Rectum) CYSTOSCOPY WITH FIREFLY INJECTIONS AND BILATERAL OPEN ENDED CATHETER PLACEMENT (N/A Bladder)  Patient Location: PACU  Anesthesia Type:General  Level of Consciousness: drowsy and responds to stimulation  Airway & Oxygen Therapy: Patient Spontanous Breathing and Patient connected to face mask oxygen  Post-op Assessment: Report given to RN and Post -op Vital signs reviewed and stable  Post vital signs: Reviewed and stable  Last Vitals:  Vitals Value Taken Time  BP 120/60 05/25/20 1348  Temp    Pulse 69 05/25/20 1354  Resp 24 05/25/20 1354  SpO2 93 % 05/25/20 1354  Vitals shown include unvalidated device data.  Last Pain:  Vitals:   05/25/20 0603  TempSrc:   PainSc: 0-No pain      Patients Stated Pain Goal: 3 (93/79/02 4097)  Complications: No complications documented.

## 2020-05-25 NOTE — Brief Op Note (Signed)
05/25/2020  8:24 AM  PATIENT:  Christopher Delacruz  70 y.o. male  PRE-OPERATIVE DIAGNOSIS:  RECTAL CANCER  POST-OPERATIVE DIAGNOSIS:  RECTAL CANCER  PROCEDURE:  Cystoscopy with Bilateral retrogardes and stent placement  SURGEON:  Surgeon(s) and Role:     Alexis Frock, MD - Primary  PHYSICIAN ASSISTANT:   ASSISTANTS: none   ANESTHESIA:   general  EBL:  minmal   BLOOD ADMINISTERED:none  DRAINS: none   LOCAL MEDICATIONS USED:  NONE  SPECIMEN:  No Specimen  DISPOSITION OF SPECIMEN:  N/A  COUNTS:  YES  TOURNIQUET:  * No tourniquets in log *  DICTATION: .Other Dictation: Dictation Number [id number]  PLAN OF CARE: remain in OR for general surgery portion of procedure  PATIENT DISPOSITION:  as per above   Delay start of Pharmacological VTE agent (>24hrs) due to surgical blood loss or risk of bleeding: not applicable

## 2020-05-25 NOTE — H&P (Signed)
Christopher Delacruz is an 70 y.o. male.    Chief Complaint: Pre-OP BILATERAL retrograde / stent placement  HPI:   1 - Colon Cancer - pt undergoing pelvic extirpative surgery for colon cancer, primary team reqeusts peri-op stents / ICG marking for ureteral identification. CT reviewed, Single ureters bilaterally. No prior GU surgery.   Today "French" is seen for peri-op ureteral stenting / ICG retrogrades.   Past Medical History:  Diagnosis Date  . Acute renal failure superimposed on stage 3a chronic kidney disease ([place]) 09/07/2019  . B12 deficiency 09/17/2019  . Cancer [facility])    rectal cancer   . CVA (cerebral vascular accident) ([place])   . Diabetes mellitus ([place])    type 2 - controlled by diet on no meds   . Heart murmur   . Hyperlipidemia   . Hypertension   . Right hemiparesis ([place]) 2017   Associated with stroke    Past Surgical History:  Procedure Laterality Date  . COLONOSCOPY N/A 09/11/2019   Procedure: COLONOSCOPY;  Surgeon: [name], MD;  Location: [facility] ENDOSCOPY;  Service: Endoscopy;  Laterality: N/A;  . COLONOSCOPY WITH PROPOFOL N/A 09/12/2019   Procedure: COLONOSCOPY WITH PROPOFOL;  Surgeon: [name], MD;  Location: [facility] ENDOSCOPY;  Service: Gastroenterology;  Laterality: N/A;  . COLONOSCOPY WITH PROPOFOL N/A 09/13/2019   Procedure: COLONOSCOPY WITH PROPOFOL;  Surgeon: [name], MD;  Location: [facility] ENDOSCOPY;  Service: Gastroenterology;  Laterality: N/A;  . ESOPHAGOGASTRODUODENOSCOPY N/A 09/11/2019   Procedure: ESOPHAGOGASTRODUODENOSCOPY (EGD);  Surgeon: [name], MD;  Location: [facility] ENDOSCOPY;  Service: Endoscopy;  Laterality: N/A;  . EXPLORE EYE SOCKET    . PORTACATH PLACEMENT N/A 09/15/2019   Procedure: INSERTION PORT-A-CATH;  Surgeon: Jules Husbands, MD;  Location: [facility] ORS;  Service: General;  Laterality: N/A;    Family History  Problem Relation Age of Onset  . Heart attack Sister   . Leukemia Brother   . Stroke Brother    Social History:  reports that he  has never smoked. He has never used smokeless tobacco. He reports that he does not drink alcohol and does not use drugs.  Allergies: No Known Allergies  Medications Prior to Admission  Medication Sig Dispense Refill  . amLODipine (NORVASC) 10 MG tablet Take 1 tablet (10 mg total) by mouth daily. 30 tablet 1  . aspirin EC 81 MG EC tablet Take 1 tablet (81 mg total) by mouth daily. Swallow whole. 30 tablet 11  . capecitabine (XELODA) 500 MG tablet TAKE 3 TABLETS (1,500 MG TOTAL) BY MOUTH 2 (TWO) TIMES DAILY AFTER A MEAL. TAKE MONDAY THROUGH FRIDAY DURING RADIATION. 150 tablet 0  . Cholecalciferol (VITAMIN D3) 50 MCG (2000 UT) capsule Take 2,000 Units by mouth daily.    . clopidogrel (PLAVIX) 75 MG tablet Take 75 mg by mouth daily.    . ferrous sulfate 325 (65 FE) MG tablet Take 1 tablet (325 mg total) by mouth daily. 30 tablet 3  . finasteride (PROSCAR) 5 MG tablet Take 5 mg by mouth daily.    . hydrochlorothiazide (HYDRODIURIL) 12.5 MG tablet Take 12.5 mg by mouth daily.    . multivitamin (ONE-A-DAY MEN'S) TABS tablet Take 1 tablet by mouth daily.    . sertraline (ZOLOFT) 25 MG tablet Take 25 mg by mouth at bedtime.    . vitamin B-12 (CYANOCOBALAMIN) 1000 MCG tablet Take 1,000 mcg by mouth daily.    . ondansetron (ZOFRAN) 8 MG tablet Take 1 tablet (8 mg total) by mouth 2 (two) times daily as  needed for refractory nausea / vomiting. Start on day 3 after chemotherapy. (Patient taking differently: Take 2 mg by mouth 2 (two) times daily as needed for nausea (Start on day 3 after chemotherapy.).) 30 tablet 1  . prochlorperazine (COMPAZINE) 10 MG tablet Take 10 mg by mouth every 6 (six) hours as needed for nausea or vomiting.      No results found for this or any previous visit (from the past 48 hour(s)). No results found.  Review of Systems  Constitutional: Negative.   HENT: Negative.   Eyes: Negative.   Respiratory: Negative.   Cardiovascular: Negative.   Gastrointestinal: Negative.    Endocrine: Negative.   Genitourinary: Negative.  Negative for dysuria and urgency.  Allergic/Immunologic: Negative.   Neurological: Negative.   Hematological: Negative.   Psychiatric/Behavioral: Negative.   All other systems reviewed and are negative.   Blood pressure (!) 164/83, pulse 65, temperature 98 F (36.7 C), temperature source Oral, resp. rate 17, height 6\' 3"  (1.905 m), weight 112.9 kg, SpO2 93 %. Physical Exam Vitals reviewed.  HENT:     Head: Normocephalic.     Nose: Nose normal.  Eyes:     Pupils: Pupils are equal, round, and reactive to light.  Cardiovascular:     Rate and Rhythm: Normal rate.  Pulmonary:     Effort: Pulmonary effort is normal.  Abdominal:     General: Abdomen is flat.  Genitourinary:    Comments: UNcirc'd. No CVAT Musculoskeletal:        General: Normal range of motion.     Cervical back: Normal range of motion.  Skin:    General: Skin is warm.  Neurological:     Mental Status: He is alert.      Assessment/Plan  Proceed as planned with peri-op ureteral stenting / ICG (Firefly) retrogrades for ureteral marking / protection. Risks, benefits, alternatives, expected peri-op course discussed.   Alexis Frock, MD 05/25/2020, 7:05 AM

## 2020-05-25 NOTE — Anesthesia Procedure Notes (Deleted)
Performed by: Eben Burow, CRNA

## 2020-05-25 NOTE — Anesthesia Procedure Notes (Signed)
Procedure Name: Intubation Date/Time: 05/25/2020 8:02 AM Performed by: Eben Burow, CRNA Pre-anesthesia Checklist: Patient identified, Emergency Drugs available, Suction available, Patient being monitored and Timeout performed Patient Re-evaluated:Patient Re-evaluated prior to induction Oxygen Delivery Method: Circle system utilized Preoxygenation: Pre-oxygenation with 100% oxygen Induction Type: IV induction Ventilation: Mask ventilation without difficulty Laryngoscope Size: Mac and 4 Grade View: Grade I Tube type: Oral Tube size: 7.5 mm Number of attempts: 1 Airway Equipment and Method: Stylet Placement Confirmation: ETT inserted through vocal cords under direct vision,  positive ETCO2 and breath sounds checked- equal and bilateral Secured at: 23 cm Tube secured with: Tape Dental Injury: Teeth and Oropharynx as per pre-operative assessment

## 2020-05-26 ENCOUNTER — Encounter (HOSPITAL_COMMUNITY): Payer: Self-pay | Admitting: Surgery

## 2020-05-26 LAB — BASIC METABOLIC PANEL
Anion gap: 10 (ref 5–15)
BUN: 36 mg/dL — ABNORMAL HIGH (ref 8–23)
CO2: 24 mmol/L (ref 22–32)
Calcium: 8.7 mg/dL — ABNORMAL LOW (ref 8.9–10.3)
Chloride: 108 mmol/L (ref 98–111)
Creatinine, Ser: 2.64 mg/dL — ABNORMAL HIGH (ref 0.61–1.24)
GFR, Estimated: 25 mL/min — ABNORMAL LOW (ref >60)
Glucose, Bld: 158 mg/dL — ABNORMAL HIGH (ref 70–99)
Potassium: 4.9 mmol/L (ref 3.5–5.1)
Sodium: 142 mmol/L (ref 135–145)

## 2020-05-26 LAB — CBC
HCT: 31.2 % — ABNORMAL LOW (ref 39.0–52.0)
Hemoglobin: 9.8 g/dL — ABNORMAL LOW (ref 13.0–17.0)
MCH: 29.2 pg (ref 26.0–34.0)
MCHC: 31.4 g/dL (ref 30.0–36.0)
MCV: 92.9 fL (ref 80.0–100.0)
Platelets: 193 10*3/uL (ref 150–400)
RBC: 3.36 MIL/uL — ABNORMAL LOW (ref 4.22–5.81)
RDW: 12.5 % (ref 11.5–15.5)
WBC: 9.7 10*3/uL (ref 4.0–10.5)
nRBC: 0 % (ref 0.0–0.2)

## 2020-05-26 LAB — MAGNESIUM: Magnesium: 1.8 mg/dL (ref 1.7–2.4)

## 2020-05-26 LAB — GLUCOSE, CAPILLARY
Glucose-Capillary: 126 mg/dL — ABNORMAL HIGH (ref 70–99)
Glucose-Capillary: 185 mg/dL — ABNORMAL HIGH (ref 70–99)
Glucose-Capillary: 143 mg/dL — ABNORMAL HIGH (ref 70–99)
Glucose-Capillary: 145 mg/dL — ABNORMAL HIGH (ref 70–99)

## 2020-05-26 MED ORDER — GLUCERNA SHAKE PO LIQD
237.0000 mL | Freq: Two times a day (BID) | ORAL | Status: DC
  Administered 2020-05-26 – 2020-05-29 (×6): 237 mL via ORAL
  Filled 2020-05-26 (×8): qty 237

## 2020-05-26 NOTE — Evaluation (Signed)
Physical Therapy Evaluation Patient Details Name: Christopher Delacruz MRN: [id number] DOB: Dec 31, 1950 Today's Date: 05/26/2020   History of Present Illness  Christopher Delacruz is a 70 yo male rectal CA s/p robotic LAR resection 4/29. PMH: HTN, CKD, rectal CA, CVA with R hemiparesis, diabetes    Clinical Impression  Pt admitted with above diagnosis. Pt mentions using RW at baseline consistently, varying responses for assistance with ADLs/IADLs and home set up and no family to clarify. Pt currently requiring max A with HOB elevated and sequencing cues to come to sitting EOB. Pt fatigues easily while seated EOB, requiring min A to maintain static sitting and BUE assisting, requires +2 assist for return to supine and repositioning in bed with bed pad. Pt currently with functional limitations due to the deficits listed below (see PT Problem List). Pt will benefit from skilled PT to increase their independence and safety with mobility to allow discharge to the venue listed below.       Follow Up Recommendations SNF    Equipment Recommendations  None recommended by PT (defer to next venue)    Recommendations for Other Services       Precautions / Restrictions Precautions Precautions: Fall Precaution Comments: R drain Restrictions Weight Bearing Restrictions: No      Mobility  Bed Mobility Overal bed mobility: Needs Assistance Bed Mobility: Supine to Sit;Sit to Supine  Supine to sit: Max assist;HOB elevated Sit to supine: Max assist;+2 for physical assistance   General bed mobility comments: max A to inch BLE over to EOB then upright trunk with HOB elevated, cued through log roll technique; max A +2 to return to supine for trunk and UE assist, +2 to scoot up in bed and reposition to comfort    Transfers  General transfer comment: unable, fatigued with sitting EOB ~5 min  Ambulation/Gait  General Gait Details: unable  Stairs            Wheelchair Mobility    Modified Rankin (Stroke  Patients Only)       Balance Overall balance assessment: Needs assistance Sitting-balance support: Feet supported;Bilateral upper extremity supported Sitting balance-Leahy Scale: Poor Sitting balance - Comments: seated EOB, strong posterior lean requiring min A to maintain sitting Postural control: Posterior lean     Standing balance comment: unable        Pertinent Vitals/Pain Pain Assessment: Faces Faces Pain Scale: Hurts even more Pain Location: abdomen Pain Descriptors / Indicators: Grimacing;Guarding Pain Intervention(s): Limited activity within patient's tolerance;Monitored during session    Home Living Family/patient expects to be discharged to:: Unsure  Additional Comments: Pt states he lives home alone, later states he lives with his sister, unsure and no family present to clarify    Prior Function   Comments: Varying responses, no family present to clarify     Hand Dominance        Extremity/Trunk Assessment   Upper Extremity Assessment Upper Extremity Assessment: Defer to OT evaluation    Lower Extremity Assessment Lower Extremity Assessment: Generalized weakness    Cervical / Trunk Assessment Cervical / Trunk Assessment: Kyphotic  Communication   Communication: Expressive difficulties  Cognition Arousal/Alertness: Awake/alert Behavior During Therapy: WFL for tasks assessed/performed Overall Cognitive Status: History of cognitive impairments - at baseline  General Comments: Pt requires multimodal cues, increased time with 1 step commands, easily distracted, pleasant, varying responses to home and PLOF questions- noted to have cognitive decifits and unsure of baseline.      General Comments  Exercises     Assessment/Plan    PT Assessment Patient needs continued PT services  PT Problem List Decreased strength;Decreased range of motion;Decreased activity tolerance;Decreased balance;Decreased mobility;Decreased cognition;Decreased knowledge of  use of DME;Decreased safety awareness;Cardiopulmonary status limiting activity;Obesity;Pain       PT Treatment Interventions DME instruction;Gait training;Functional mobility training;Therapeutic activities;Therapeutic exercise;Balance training;Patient/family education    PT Goals (Current goals can be found in the Care Plan section)  Acute Rehab PT Goals Patient Stated Goal: "eat some food" PT Goal Formulation: With patient Time For Goal Achievement: 06/09/20 Potential to Achieve Goals: Good    Frequency Min 2X/week   Barriers to discharge        Co-evaluation               AM-PAC PT "6 Clicks" Mobility  Outcome Measure Help needed turning from your back to your side while in a flat bed without using bedrails?: Total Help needed moving from lying on your back to sitting on the side of a flat bed without using bedrails?: Total Help needed moving to and from a bed to a chair (including a wheelchair)?: Total Help needed standing up from a chair using your arms (e.g., wheelchair or bedside chair)?: Total Help needed to walk in hospital room?: Total Help needed climbing 3-5 steps with a railing? : Total 6 Click Score: 6    End of Session Equipment Utilized During Treatment: Oxygen Activity Tolerance: Patient limited by fatigue;Patient limited by pain Patient left: in bed;with call bell/phone within reach;with bed alarm set;with nursing/sitter in room Nurse Communication: Mobility status PT Visit Diagnosis: Other abnormalities of gait and mobility (R26.89);Muscle weakness (generalized) (M62.81)    Time: 2130-8657 PT Time Calculation (min) (ACUTE ONLY): 20 min   Charges:   PT Evaluation $PT Eval Moderate Complexity: 1 Mod           Tori Latreece Mochizuki PT, DPT 05/26/20, 11:47 AM

## 2020-05-26 NOTE — Evaluation (Signed)
Occupational Therapy Evaluation Patient Details Name: Christopher Delacruz MRN: [id number] DOB: 1950-11-02 Today's Date: 05/26/2020    History of Present Illness Christopher Delacruz is a 70 yo male rectal CA s/p robotic LAR resection 4/29. PMH: HTN, CKD, rectal CA, CVA with R hemiparesis, diabetes   Clinical Impression   Patient is currently requiring assistance with ADLs including total assist with toileting, and with LE dressing, maximum assist with bathing, and moderate assist with UE dressing, all of which is below patient's typical baseline of being supervision to Modified independent at his ALF(exception of occasional assist with shoes and belt from nephew before going out in community).  During this evaluation, patient was limited by confusion, post-op pain and edema as well as generalized weakness and decreased activity tolerance, all of which has the potential to impact patient's safety and independence during functional mobility, as well as performance for ADLs. [name] "6-clicks" Daily Activity Inpatient Short Form score of 12/24 indicates 66.57% ADL impairment this session. Patient lives at [facility]. Patient demonstrates good rehab potential, and should benefit from continued skilled occupational therapy services while in acute care to maximize safety, independence and quality of life at home.  Continued occupational therapy services in a SNF setting prior to return home is recommended.  ?    Follow Up Recommendations  SNF    Equipment Recommendations  3 in 1 bedside commode    Recommendations for Other Services       Precautions / Restrictions Precautions Precautions: Fall Precaution Comments: R JP drain, abd incision-keep gait belt high Restrictions Weight Bearing Restrictions: No      Mobility Bed Mobility Overal bed mobility: Needs Assistance Bed Mobility: Supine to Sit;Sit to Supine     Supine to sit: Max assist;HOB elevated Sit  to supine: +2 for physical assistance;Total assist   General bed mobility comments: max A to inch BLE over to EOB then upright trunk with HOB elevated, cued through log roll technique; Total A +2 to return to supine for trunk and UE assist, +2 to scoot up in bed and reposition to comfort. Pt with apprent impaired motor planning.    Transfers                 General transfer comment: unable x 2 attempts.    Balance Overall balance assessment: Needs assistance Sitting-balance support: Feet supported;Bilateral upper extremity supported Sitting balance-Leahy Scale: Poor Sitting balance - Comments: seated EOB, strong posterior lean requiring min A to maintain sitting Postural control: Posterior lean     Standing balance comment: unable                           ADL either performed or assessed with clinical judgement   ADL Overall ADL's : Needs assistance/impaired Eating/Feeding: Minimal assistance;Bed level Eating/Feeding Details (indicate cue type and reason): Fluid diet currently. Grooming: Bed level;Minimal assistance   Upper Body Bathing: Moderate assistance;Bed level   Lower Body Bathing: Maximal assistance;Sitting/lateral leans;Bed level   Upper Body Dressing : Moderate assistance;Bed level   Lower Body Dressing: Total assistance;Bed level     Toilet Transfer Details (indicate cue type and reason): Pt unable to stand from EOB despite Max Assist x 2 attempts. Toileting- Clothing Manipulation and Hygiene: Total assistance;Bed level Toileting - Clothing Manipulation Details (indicate cue type and reason): Currently with foley cath   Tub/Shower Transfer Details (indicate cue type and reason): Unable to assess Functional mobility during ADLs:  Maximal assistance       Vision Patient Visual Report: No change from baseline Additional Comments: Able to read clock on wall.     Perception     Praxis      Pertinent Vitals/Pain Pain Assessment:  Faces Faces Pain Scale: Hurts even more Pain Location: abdomen Pain Descriptors / Indicators: Grimacing;Guarding Pain Intervention(s): Limited activity within patient's tolerance;Monitored during session;Repositioned (Pt reoriented to situation/recent surgery)     Hand Dominance Right   Extremity/Trunk Assessment Upper Extremity Assessment Upper Extremity Assessment: LUE deficits/detail;RUE deficits/detail;Generalized weakness RUE Deficits / Details: Shoulder ROM limitations. RUE Coordination: decreased fine motor;decreased gross motor LUE Deficits / Details: Shoulder ROM limitations. LUE Coordination: decreased fine motor   Lower Extremity Assessment Lower Extremity Assessment: Defer to PT evaluation;Generalized weakness   Cervical / Trunk Assessment Cervical / Trunk Assessment: Kyphotic   Communication Communication Communication: Expressive difficulties   Cognition Arousal/Alertness: Awake/alert Behavior During Therapy: WFL for tasks assessed/performed Overall Cognitive Status: Impaired/Different from baseline Area of Impairment: Orientation;Following commands;Awareness;Attention                 Orientation Level: Place;Time;Situation     Following Commands: Follows one step commands inconsistently   Awareness: Intellectual   General Comments: Pt requires multimodal cues, increased time with 1 step commands, easily distracted, pleasant, varying responses to home and PLOF questions- noted to have cognitive decifits at this time.  Nephew reports recent diagnosis of early onset dementia, but pt is usually still oriented and following instructions, just with memory defiicts. Nephew stated that pt is stubborn about maintaining his independence.   General Comments       Exercises     Shoulder Instructions      Home Living Family/patient expects to be discharged to:: Assisted living   Available Help at Discharge: Available 24 hours/day;Other (Comment) Engineer, civil (consulting).  Pt  also has a roommate at ALF.) Type of Home: Assisted living Home Access: Level entry     Home Layout: One level     Bathroom Shower/Tub: Walk-in shower (Nephew unable to recall if pt has shower chair or grab bars installed.)   Bathroom Toilet: Handicapped height Bathroom Accessibility: Yes   Home Equipment: [place] - single point;Walker - 2 wheels   Additional Comments: Pt unable to give reliable history. OT contacted pt's nephew, Kassius Battiste who provided all information below.      Prior Functioning/Environment Level of Independence: Independent;Needs assistance  Gait / Transfers Assistance Needed: Independent for short distances. 1 falls in last year trying to get either in or out of bed without injury. Refuses to use walker.  Greeted at [employer] appointments with wheelchair. ADL's / Homemaking Assistance Needed: Per nephew, pt refuses assistance for ADLs from CNAs at ALF but will ask nephew for help with donning shoes and belt before leaving for appointments. Pt performs his basic ADLs on his own. Communication / Swallowing Assistance Needed: dysarthric Comments: Varying responses, no family present to clarify        OT Problem List: Decreased strength;Decreased coordination;Pain;Decreased cognition;Decreased range of motion;Decreased activity tolerance;Decreased safety awareness;Decreased knowledge of use of DME or AE;Impaired balance (sitting and/or standing);Decreased knowledge of precautions      OT Treatment/Interventions: Self-care/ADL training;Therapeutic exercise;Therapeutic activities;Cognitive remediation/compensation;DME and/or AE instruction;Patient/family education;Balance training    OT Goals(Current goals can be found in the care plan section) Acute Rehab OT Goals Patient Stated Goal: Stand up OT Goal Formulation: With patient Time For Goal Achievement: 06/09/20 Potential to Achieve Goals: Good ADL Goals Pt Will Perform  Lower Body Bathing: with min guard  assist;sitting/lateral leans;sit to/from stand Pt Will Perform Lower Body Dressing: with min guard assist;sit to/from stand;sitting/lateral leans Pt Will Transfer to Toilet: with min guard assist;ambulating;bedside commode Pt Will Perform Toileting - Clothing Manipulation and hygiene: with supervision;sitting/lateral leans;sit to/from stand Pt/caregiver will Perform Home Exercise Program: Increased ROM;Both right and left upper extremity;With Supervision  OT Frequency: Min 2X/week   Barriers to D/C:            Co-evaluation              AM-PAC OT "6 Clicks" Daily Activity     Outcome Measure Help from another person eating meals?: A Little Help from another person taking care of personal grooming?: A Little Help from another person toileting, which includes using toliet, bedpan, or urinal?: Total Help from another person bathing (including washing, rinsing, drying)?: A Lot Help from another person to put on and taking off regular upper body clothing?: A Lot Help from another person to put on and taking off regular lower body clothing?: Total 6 Click Score: 12   End of Session Equipment Utilized During Treatment: Gait belt;Rolling walker;Oxygen Nurse Communication: Mobility status  Activity Tolerance: Patient tolerated treatment well Patient left: in bed;with call bell/phone within reach;with bed alarm set;with SCD's reapplied  OT Visit Diagnosis: Unsteadiness on feet (R26.81);Other symptoms and signs involving cognitive function;History of falling (Z91.81);Muscle weakness (generalized) (M62.81);Hemiplegia and hemiparesis;Pain Hemiplegia - Right/Left: Right Hemiplegia - dominant/non-dominant: Dominant Hemiplegia - caused by: Cerebral infarction (remote) Pain - part of body:  (ABD)                Time: 1335-1405 OT Time Calculation (min): 30 min Charges:  OT General Charges $OT Visit: 1 Visit OT Evaluation $OT Eval Low Complexity: 1 Low OT Treatments $Therapeutic  Activity: 8-22 mins  Anderson Malta, OT Acute Rehab Services Office: [phone] 05/26/2020  Julien Girt 05/26/2020, 3:12 PM

## 2020-05-26 NOTE — Progress Notes (Signed)
Christopher Delacruz [id number] 02/24/1950  CARE TEAM:  PCP: [name], MD  Outpatient Care Team: Patient Care Team: [name], MD as PCP - General (Family Medicine) Borders, Kirt Boys, NP as Nurse Practitioner (Hospice and Palliative Medicine) [name], MD as Consulting Physician (Oncology) [name], MD as Referring Physician (Radiation Oncology) [name], NP as Nurse Practitioner [name], RN as Oncology Nurse Navigator [name], MD as Consulting Physician (Neurology) [name], MD as Consulting Physician (Gastroenterology) [name], MD as Consulting Physician (Psychiatry) [name], MD as Consulting Physician (Colon and Rectal Surgery) [name], MD as Consulting Physician (Cardiology)  Inpatient Treatment Team: Treatment Team: Attending Provider: [name], MD; Physical Therapist: Donalee Citrin, PT; Technician: Ernest Mallick, NT; Utilization Review: Alease Medina, RN; Registered Nurse: Tanda Rockers, RN; Occupational Therapist: Julien Girt, OT; Case Manager: Harless Litten, RN; Social Worker: Sherie Don, LCSW   Problem List:   Principal Problem:   Rectal cancer s/p robotic LAR resectrion 05/25/2020 Active Problems:   Cognitive impairment   History of CVA (cerebrovascular accident)   Diabetes ([place])   HTN (hypertension)   BPH (benign prostatic hyperplasia)   B12 deficiency   CKD (chronic kidney disease) stage 3, GFR 30-59 ml/min ([place])   Hypertension associated with diabetes ([place])   Anemia in stage 3b chronic kidney disease ([place])   1 Day Post-Op  05/25/2020  POST-OPERATIVE DIAGNOSIS:  PROXIMAL TO MID RECTAL CANCER  PROCEDURE:   XI ROBOTIC ASSISTED LOW ANTERIOR EXTENDED RECTOSIGMOID RESECTION   MOBILIZATION OF THE SPENIC FLEXURE OF THE COLON  TRANSVERSUS ABDOMINIS PLANE (TAP) BLOCK - BILATERAL  INTRAOPERATIVE ASSESSMENT OF TISSUE PERFUSION USING ICG (indocyanine  green) IMMUNOFLUORESCENCE  RIGID PROCTOSCOPY  SURGEON:  [name], MD   05/25/2020  POST-OPERATIVE DIAGNOSIS:  RECTAL CANCER  PROCEDURE:  Cystoscopy with Bilateral retrogardes and stent placement  SURGEON:   Alexis Frock, MD - Primary  Assessment  OK  [facility] Stay = 1 days)  Plan:  -Enhance recovery protocol.  Stop IV fluids.  As needed for backup.  Elevated creatinine in the setting of chronic kidney disease.  Nonoliguric.  She had resolved.  Hematuria most likely related to rigid pelvis on cystoscopy with some irritation.  No major clots concerning for obstruction.  Discussed with Dr. Bess Harvest.  Should continue to self resolved.  Follow-up on pathology.  Follow mental status given his history of stroke and dementia.  No evidence of any delirium reassuring.  Diabetic control.  Sliding scale insulin.  Hypertension control.  VTE prophylaxis- SCDs, etc. we will do heparin given elevated creatinine making enoxaparin more challenging.  Defer to pharmacy on adjustment as needed.  mobilize as tolerated to help recovery  Disposition:  Disposition:  The patient is from: Home  Anticipate discharge to:  Home with Home Health  Anticipated Date of Discharge is:  May 3,2022    Barriers to discharge:  Pending Clinical improvement (more likely than not)  Patient currently is Stable for discharge from the hospital from a surgery standpoint.      20 minutes spent in review, evaluation, examination, counseling, and coordination of care.   I have reviewed this patient's available data, including medical history, events of note, physical examination and test results as part of my evaluation.  A significant portion of that time was spent in counseling.  Care during the described time interval was provided by me.  05/26/2020    Subjective: (Chief complaint)  No major events.  Denies pain.  Tolerating liquids.  Objective:  Vital signs:  Vitals:    05/25/20 2037 05/26/20 0032 05/26/20 0449 05/26/20 0500  BP: (!) 120/54 (!) 103/56 (!) 106/59   Pulse: 83 78 80   Resp: 16 14 16    Temp: 97.9 F (36.6 C) 99.2 F (37.3 C) (!) 100.4 F (38 C)   TempSrc: Oral Oral Oral   SpO2: 97% 94% 95%   Weight:    112.8 kg  Height:        Last BM Date: 05/25/20  Intake/Output   Yesterday:  04/29 0701 - 04/30 0700 In: 5535.2 [P.O.:720; I.V.:4615.2; IV Piggyback:200] Out: 1500 [Urine:825; Drains:575; Blood:100] This shift:  No intake/output data recorded.  Bowel function:  Flatus: YES  BM:  No  Drain: Serosanguinous   Physical Exam:  General: Pt awake/alert in no acute distress Eyes: PERRL, normal EOM.  Sclera clear.  No icterus Neuro: CN II-XII intact w/o focal sensory/motor deficits. Lymph: No head/neck/groin lymphadenopathy Psych:  No delerium/psychosis/paranoia.  Oriented x 2 HENT: Normocephalic, Mucus membranes moist.  No thrush Neck: Supple, No tracheal deviation.  No obvious thyromegaly Chest: No pain to chest wall compression.  Good respiratory excursion.  No audible wheezing CV:  Pulses intact.  Regular rhythm.  No major extremity edema MS: Normal AROM mjr joints.  No obvious deformity  Abdomen: Soft.  Mildy distended.  Mildly tender at incisions only.  No evidence of peritonitis.  No incarcerated hernias.  Ext:  No deformity.  No mjr edema.  No cyanosis Skin: No petechiae / purpurea.  No major sores.  Warm and dry    Results:   Cultures: Recent Results (from the past 720 hour(s))  SARS Coronavirus 2 by RT PCR (hospital order, performed in [facility] hospital lab) Nasopharyngeal Nasopharyngeal Swab     Status: None   Collection Time: 05/25/20  6:44 AM   Specimen: Nasopharyngeal Swab  Result Value Ref Range Status   SARS Coronavirus 2 NEGATIVE NEGATIVE Final    Comment: (NOTE) SARS-CoV-2 target nucleic acids are NOT DETECTED.  The SARS-CoV-2 RNA is generally detectable in upper and lower respiratory specimens  during the acute phase of infection. The lowest concentration of SARS-CoV-2 viral copies this assay can detect is 250 copies / mL. A negative result does not preclude SARS-CoV-2 infection and should not be used as the sole basis for treatment or other patient management decisions.  A negative result may occur with improper specimen collection / handling, submission of specimen other than nasopharyngeal swab, presence of viral mutation(s) within the areas targeted by this assay, and inadequate number of viral copies (<250 copies / mL). A negative result must be combined with clinical observations, patient history, and epidemiological information.  Fact Sheet for Patients:   StrictlyIdeas.no  Fact Sheet for Healthcare Providers: BankingDealers.co.za  This test is not yet approved or  cleared by the [place] FDA and has been authorized for detection and/or diagnosis of SARS-CoV-2 by FDA under an Emergency Use Authorization (EUA).  This EUA will remain in effect (meaning this test can be used) for the duration of the COVID-19 declaration under Section 564(b)(1) of the Act, 21 U.S.C. section 360bbb-3(b)(1), unless the authorization is terminated or revoked sooner.  Performed at [facility], [place] [address] [place], [place] [address]     Labs: Results for orders placed or performed during the hospital encounter of 05/25/20 (from the past 48 hour(s))  SARS Coronavirus 2 by RT PCR (hospital order, performed in [facility] hospital lab) Nasopharyngeal Nasopharyngeal Swab     Status: None   Collection Time: 05/25/20  6:44 AM   Specimen: Nasopharyngeal Swab  Result Value Ref Range   SARS Coronavirus 2 NEGATIVE NEGATIVE    Comment: (NOTE) SARS-CoV-2 target nucleic acids are NOT DETECTED.  The SARS-CoV-2 RNA is generally detectable in upper and lower respiratory specimens during the acute phase of infection. The  lowest concentration of SARS-CoV-2 viral copies this assay can detect is 250 copies / mL. A negative result does not preclude SARS-CoV-2 infection and should not be used as the sole basis for treatment or other patient management decisions.  A negative result may occur with improper specimen collection / handling, submission of specimen other than nasopharyngeal swab, presence of viral mutation(s) within the areas targeted by this assay, and inadequate number of viral copies (<250 copies / mL). A negative result must be combined with clinical observations, patient history, and epidemiological information.  Fact Sheet for Patients:   StrictlyIdeas.no  Fact Sheet for Healthcare Providers: BankingDealers.co.za  This test is not yet approved or  cleared by the [place] FDA and has been authorized for detection and/or diagnosis of SARS-CoV-2 by FDA under an Emergency Use Authorization (EUA).  This EUA will remain in effect (meaning this test can be used) for the duration of the COVID-19 declaration under Section 564(b)(1) of the Act, 21 U.S.C. section 360bbb-3(b)(1), unless the authorization is terminated or revoked sooner.  Performed at [facility], [place] [address], [place], [place] [address]   Glucose, capillary     Status: Abnormal   Collection Time: 05/25/20  2:01 PM  Result Value Ref Range   Glucose-Capillary 175 (H) 70 - 99 mg/dL    Comment: Glucose reference range applies only to samples taken after fasting for at least 8 hours.  Glucose, capillary     Status: Abnormal   Collection Time: 05/25/20  8:44 PM  Result Value Ref Range   Glucose-Capillary 170 (H) 70 - 99 mg/dL    Comment: Glucose reference range applies only to samples taken after fasting for at least 8 hours.  Magnesium     Status: None   Collection Time: 05/26/20  3:52 AM  Result Value Ref Range   Magnesium 1.8 1.7 - 2.4 mg/dL    Comment:  Performed at [facility], [place] [address] [place], [place] [address]  CBC     Status: Abnormal   Collection Time: 05/26/20  3:52 AM  Result Value Ref Range   WBC 9.7 4.0 - 10.5 K/uL   RBC 3.36 (L) 4.22 - 5.81 MIL/uL   Hemoglobin 9.8 (L) 13.0 - 17.0 g/dL   HCT 31.2 (L) 39.0 - 52.0 %   MCV 92.9 80.0 - 100.0 fL   MCH 29.2 26.0 - 34.0 pg   MCHC 31.4 30.0 - 36.0 g/dL   RDW 12.5 11.5 - 15.5 %   Platelets 193 150 - 400 K/uL   nRBC 0.0 0.0 - 0.2 %    Comment: Performed at [facility], [place] [address] [place], [place] [address]  Basic metabolic panel     Status: Abnormal   Collection Time: 05/26/20  3:52 AM  Result Value Ref Range   Sodium 142 135 - 145 mmol/L   Potassium 4.9 3.5 - 5.1 mmol/L   Chloride 108 98 - 111 mmol/L   CO2 24 22 - 32 mmol/L   Glucose, Bld 158 (H) 70 - 99 mg/dL  Comment: Glucose reference range applies only to samples taken after fasting for at least 8 hours.   BUN 36 (H) 8 - 23 mg/dL   Creatinine, Ser 2.64 (H) 0.61 - 1.24 mg/dL   Calcium 8.7 (L) 8.9 - 10.3 mg/dL   GFR, Estimated 25 (L) >60 mL/min    Comment: (NOTE) Calculated using the CKD-EPI Creatinine Equation (2021)    Anion gap 10 5 - 15    Comment: Performed at [facility], [place] [address] [place], [place] [address]  Glucose, capillary     Status: Abnormal   Collection Time: 05/26/20  8:05 AM  Result Value Ref Range   Glucose-Capillary 126 (H) 70 - 99 mg/dL    Comment: Glucose reference range applies only to samples taken after fasting for at least 8 hours.    Imaging / Studies: DG C-Arm 1-60 Min-No Report  Result Date: 05/25/2020 Fluoroscopy was utilized by the requesting physician.  No radiographic interpretation.    Medications / Allergies: per chart  Antibiotics: Anti-infectives (From admission, onward)   Start     Dose/Rate Route Frequency Ordered Stop   05/25/20 2200  cefoTEtan (CEFOTAN) 2 g in sodium chloride 0.9 % 100 mL IVPB         2 g 200 mL/hr over 30 Minutes Intravenous Every 12 hours 05/25/20 1741 05/25/20 2134   05/25/20 1400  neomycin (MYCIFRADIN) tablet 1,000 mg  Status:  Discontinued       "And" Linked Group Details   1,000 mg Oral 3 times per day 05/25/20 0527 05/25/20 0532   05/25/20 1400  metroNIDAZOLE (FLAGYL) tablet 1,000 mg  Status:  Discontinued       "And" Linked Group Details   1,000 mg Oral 3 times per day 05/25/20 0527 05/25/20 0532   05/25/20 0600  cefoTEtan (CEFOTAN) 2 g in sodium chloride 0.9 % 100 mL IVPB        2 g 200 mL/hr over 30 Minutes Intravenous On call to O.R. 05/25/20 0527 05/25/20 0803        Note: Portions of this report may have been transcribed using voice recognition software. Every effort was made to ensure accuracy; however, inadvertent computerized transcription errors may be present.   Any transcriptional errors that result from this process are unintentional.    [name], MD, FACS, MASCRS  Esophageal, Gastrointestinal & Colorectal Surgery Robotic and Minimally Invasive Surgery [facility] [address], [place], [place] [address] [phone] Fax [phone] Main/Paging  CONTACT INFORMATION: Weekday (9AM-5PM) concerns: Call [facility] main office at [phone] Weeknight (5PM-9AM) or Weekend/Holiday concerns: Check [email or web address] for General Surgery [facility] coverage (Please, do not use SecureChat as it is not reliable communication to operating surgeons for immediate patient care)      05/26/2020  8:48 AM

## 2020-05-27 DIAGNOSIS — E1121 Type 2 diabetes mellitus with diabetic nephropathy: Secondary | ICD-10-CM | POA: Insufficient documentation

## 2020-05-27 DIAGNOSIS — R4701 Aphasia: Secondary | ICD-10-CM

## 2020-05-27 DIAGNOSIS — E039 Hypothyroidism, unspecified: Secondary | ICD-10-CM | POA: Insufficient documentation

## 2020-05-27 DIAGNOSIS — F419 Anxiety disorder, unspecified: Secondary | ICD-10-CM | POA: Insufficient documentation

## 2020-05-27 DIAGNOSIS — I6990 Unspecified sequelae of unspecified cerebrovascular disease: Secondary | ICD-10-CM | POA: Insufficient documentation

## 2020-05-27 DIAGNOSIS — E559 Vitamin D deficiency, unspecified: Secondary | ICD-10-CM | POA: Insufficient documentation

## 2020-05-27 DIAGNOSIS — D518 Other vitamin B12 deficiency anemias: Secondary | ICD-10-CM | POA: Insufficient documentation

## 2020-05-27 LAB — GLUCOSE, CAPILLARY
Glucose-Capillary: 130 mg/dL — ABNORMAL HIGH (ref 70–99)
Glucose-Capillary: 136 mg/dL — ABNORMAL HIGH (ref 70–99)
Glucose-Capillary: 127 mg/dL — ABNORMAL HIGH (ref 70–99)
Glucose-Capillary: 136 mg/dL — ABNORMAL HIGH (ref 70–99)

## 2020-05-27 LAB — CREATININE, SERUM
Creatinine, Ser: 2.74 mg/dL — ABNORMAL HIGH (ref 0.61–1.24)
GFR, Estimated: 24 mL/min — ABNORMAL LOW (ref >60)

## 2020-05-27 LAB — POTASSIUM: Potassium: 4.4 mmol/L (ref 3.5–5.1)

## 2020-05-27 LAB — HEMOGLOBIN: Hemoglobin: 8.9 g/dL — ABNORMAL LOW (ref 13.0–17.0)

## 2020-05-27 NOTE — Progress Notes (Addendum)
Christopher Delacruz [id number] [birth year]  CARE TEAM:  PCP: [name], MD  Outpatient Care Team: Patient Care Team: [name], MD as PCP - General (Family Medicine) Borders, Kirt Boys, NP as Nurse Practitioner (Hospice and Palliative Medicine) [name], MD as Consulting Physician (Oncology) [name], MD as Referring Physician (Radiation Oncology) [name], NP as Nurse Practitioner [name], RN as Oncology Nurse Navigator [name], MD as Consulting Physician (Neurology) [name], MD as Consulting Physician (Gastroenterology) [name], MD as Consulting Physician (Psychiatry) [name], MD as Consulting Physician (Colon and Rectal Surgery) [name], MD as Consulting Physician (Cardiology)  Inpatient Treatment Team: Treatment Team: Attending Provider: [name], MD; Technician: Ernest Mallick, NT; Technician: Leda Quail, NT; Utilization Review: Alease Medina, RN; Social Worker: Vern Claude, [place]; Social Worker: Deirdre Peer, [place]   Problem List:   Principal Problem:   Rectal cancer s/p robotic LAR resectrion 05/25/2020 Active Problems:   Cognitive impairment   History of CVA (cerebrovascular accident)   Diabetes ([place])   HTN (hypertension)   BPH (benign prostatic hyperplasia)   B12 deficiency   CKD (chronic kidney disease) stage 3, GFR 30-59 ml/min ([place])   Hypertension associated with diabetes ([place])   Anemia in stage 3b chronic kidney disease ([place])   2 Days Post-Op  05/25/2020  POST-OPERATIVE DIAGNOSIS:  PROXIMAL TO MID RECTAL CANCER  PROCEDURE:   XI ROBOTIC ASSISTED LOW ANTERIOR EXTENDED RECTOSIGMOID RESECTION   MOBILIZATION OF THE SPENIC FLEXURE OF THE COLON  TRANSVERSUS ABDOMINIS PLANE (TAP) BLOCK - BILATERAL  INTRAOPERATIVE ASSESSMENT OF TISSUE PERFUSION USING ICG (indocyanine green) IMMUNOFLUORESCENCE  RIGID PROCTOSCOPY  SURGEON:  [name],  MD   05/25/2020  POST-OPERATIVE DIAGNOSIS:  RECTAL CANCER  PROCEDURE:  Cystoscopy with Bilateral retrogardes and stent placement  SURGEON:   Alexis Frock, MD - Primary  Assessment  RECOVERING [facility] Stay = 2 days)  Plan:  Enhance recovery protocol.  Advance diet.    Stop IV fluids.  As needed for backup.  Elevated creatinine in the setting of chronic kidney disease.  Nonoliguric.  Hematuria most likely related to rigid pelvis on cystoscopy with some irritation.  No major clots concerning for obstruction.  Resolving.  Discussed with Dr. Tresa Moore.  Should continue to self resolve.  Creatinine appears to be plateauing.  If going down and nonoliguric most likely stable for discharge in a day or 2  Follow-up on pathology.  Follow mental status given his history of stroke and dementia.  He continues to have some baseline expressive aphasia but has decent comprehension and follows commands.  No evidence of any delirium reassuring.  Diabetic control.  Sliding scale insulin.  Hypertension control.  VTE prophylaxis- SCDs, etc. we will do heparin given elevated creatinine making enoxaparin more challenging.  Defer to pharmacy on adjustment as needed.  mobilize as tolerated to help recovery  Transition care team/social work/case management evaluation for probable need for skilled facility.  Disposition:  Disposition:  The patient is from: Home  Anticipate discharge to:  [place] (SNF)   -We will reach out to the social work/case management team to see if family in agreement.  Anticipated Date of Discharge is:  May 3,2022   Barriers to discharge:  Pending Clinical improvement (more likely than not).  See if creatinine goes back down.  See if patient improves enough for home health but plan most likely skilled facility  Patient currently is Stable for discharge from the hospital from a  surgery standpoint.      20 minutes spent in review, evaluation,  examination, counseling, and coordination of care.   I have reviewed this patient's available data, including medical history, events of note, physical examination and test results as part of my evaluation.  A significant portion of that time was spent in counseling.  Care during the described time interval was provided by me.  05/27/2020    Subjective: (Chief complaint)  No major events.  Denies pain.  Try food.  Objective:  Vital signs:  Vitals:   05/26/20 1330 05/26/20 1753 05/26/20 2136 05/27/20 0528  BP: (!) 160/64 (!) 123/56 125/74 (!) 141/57  Pulse: 80 90 94 93  Resp: 18 18 20 18   Temp: 98.3 F (36.8 C) 99.7 F (37.6 C) 99.4 F (37.4 C) (!) 100.6 F (38.1 C)  TempSrc: Oral Oral Oral Oral  SpO2: 94% 100% 100% 92%  Weight:      Height:        Last BM Date: 05/25/20  Intake/Output   Yesterday:  04/30 0701 - 05/01 0700 In: 580 [P.O.:840; I.V.:3] Out: 3235 [Urine:2875; Drains:360] This shift:  No intake/output data recorded.  Bowel function:  Flatus: YES  BM:  No  Drain: Serosanguinous   Physical Exam:  General: Pt awake/alert in no acute distress Eyes: PERRL, normal EOM.  Sclera clear.  No icterus Neuro: CN II-XII intact w/o focal sensory/motor deficits. Lymph: No head/neck/groin lymphadenopathy Psych:  No delerium/psychosis/paranoia.  Oriented x 2.  Able to follow commands and answer questions but has expressive aphasia so sometimes makes confusing/inappropriate word choices but that is stable HENT: Normocephalic, Mucus membranes moist.  No thrush Neck: Supple, No tracheal deviation.  No obvious thyromegaly Chest: No pain to chest wall compression.  Good respiratory excursion.  No audible wheezing CV:  Pulses intact.  Regular rhythm.  No major extremity edema MS: Normal AROM mjr joints.  No obvious deformity  Abdomen: Soft.  Nondistended.  Mildly tender at incisions only.  No evidence of peritonitis.  No incarcerated hernias.  Ext:  No deformity.   No mjr edema.  No cyanosis Skin: No petechiae / purpurea.  No major sores.  Warm and dry    Results:   Cultures: Recent Results (from the past 720 hour(s))  SARS Coronavirus 2 by RT PCR (hospital order, performed in [facility] hospital lab) Nasopharyngeal Nasopharyngeal Swab     Status: None   Collection Time: 05/25/20  6:44 AM   Specimen: Nasopharyngeal Swab  Result Value Ref Range Status   SARS Coronavirus 2 NEGATIVE NEGATIVE Final    Comment: (NOTE) SARS-CoV-2 target nucleic acids are NOT DETECTED.  The SARS-CoV-2 RNA is generally detectable in upper and lower respiratory specimens during the acute phase of infection. The lowest concentration of SARS-CoV-2 viral copies this assay can detect is 250 copies / mL. A negative result does not preclude SARS-CoV-2 infection and should not be used as the sole basis for treatment or other patient management decisions.  A negative result may occur with improper specimen collection / handling, submission of specimen other than nasopharyngeal swab, presence of viral mutation(s) within the areas targeted by this assay, and inadequate number of viral copies (<250 copies / mL). A negative result must be combined with clinical observations, patient history, and epidemiological information.  Fact Sheet for Patients:   StrictlyIdeas.no  Fact Sheet for Healthcare Providers: BankingDealers.co.za  This test is not yet approved or  cleared by the [place] FDA and has been authorized for  detection and/or diagnosis of SARS-CoV-2 by FDA under an Emergency Use Authorization (EUA).  This EUA will remain in effect (meaning this test can be used) for the duration of the COVID-19 declaration under Section 564(b)(1) of the Act, 21 U.S.C. section 360bbb-3(b)(1), unless the authorization is terminated or revoked sooner.  Performed at [facility], [place] [address] [place], [place]  [address]     Labs: Results for orders placed or performed during the hospital encounter of 05/25/20 (from the past 48 hour(s))  Glucose, capillary     Status: Abnormal   Collection Time: 05/25/20  2:01 PM  Result Value Ref Range   Glucose-Capillary 175 (H) 70 - 99 mg/dL    Comment: Glucose reference range applies only to samples taken after fasting for at least 8 hours.  Glucose, capillary     Status: Abnormal   Collection Time: 05/25/20  8:44 PM  Result Value Ref Range   Glucose-Capillary 170 (H) 70 - 99 mg/dL    Comment: Glucose reference range applies only to samples taken after fasting for at least 8 hours.  Magnesium     Status: None   Collection Time: 05/26/20  3:52 AM  Result Value Ref Range   Magnesium 1.8 1.7 - 2.4 mg/dL    Comment: Performed at [facility], [place] [address], [place], [place] [address]  CBC     Status: Abnormal   Collection Time: 05/26/20  3:52 AM  Result Value Ref Range   WBC 9.7 4.0 - 10.5 K/uL   RBC 3.36 (L) 4.22 - 5.81 MIL/uL   Hemoglobin 9.8 (L) 13.0 - 17.0 g/dL   HCT 31.2 (L) 39.0 - 52.0 %   MCV 92.9 80.0 - 100.0 fL   MCH 29.2 26.0 - 34.0 pg   MCHC 31.4 30.0 - 36.0 g/dL   RDW 12.5 11.5 - 15.5 %   Platelets 193 150 - 400 K/uL   nRBC 0.0 0.0 - 0.2 %    Comment: Performed at [facility], [place] [address], [place],  [address]  Basic metabolic panel     Status: Abnormal   Collection Time: 05/26/20  3:52 AM  Result Value Ref Range   Sodium 142 135 - 145 mmol/L   Potassium 4.9 3.5 - 5.1 mmol/L   Chloride 108 98 - 111 mmol/L   CO2 24 22 - 32 mmol/L   Glucose, Bld 158 (H) 70 - 99 mg/dL    Comment: Glucose reference range applies only to samples taken after fasting for at least 8 hours.   BUN 36 (H) 8 - 23 mg/dL   Creatinine, Ser 2.64 (H) 0.61 - 1.24 mg/dL   Calcium 8.7 (L) 8.9 - 10.3 mg/dL   GFR, Estimated 25 (L) >60 mL/min    Comment: (NOTE) Calculated using the CKD-EPI Creatinine Equation (2021)     Anion gap 10 5 - 15    Comment: Performed at [facility], [place] [address], [place],  [address]  Glucose, capillary     Status: Abnormal   Collection Time: 05/26/20  8:05 AM  Result Value Ref Range   Glucose-Capillary 126 (H) 70 - 99 mg/dL    Comment: Glucose reference range applies only to samples taken after fasting for at least 8 hours.  Glucose, capillary     Status: Abnormal   Collection Time: 05/26/20 11:35 AM  Result Value Ref Range   Glucose-Capillary 185 (H) 70 - 99 mg/dL    Comment: Glucose reference  range applies only to samples taken after fasting for at least 8 hours.  Glucose, capillary     Status: Abnormal   Collection Time: 05/26/20  4:50 PM  Result Value Ref Range   Glucose-Capillary 143 (H) 70 - 99 mg/dL    Comment: Glucose reference range applies only to samples taken after fasting for at least 8 hours.  Glucose, capillary     Status: Abnormal   Collection Time: 05/26/20 10:27 PM  Result Value Ref Range   Glucose-Capillary 145 (H) 70 - 99 mg/dL    Comment: Glucose reference range applies only to samples taken after fasting for at least 8 hours.  Potassium     Status: None   Collection Time: 05/27/20  5:48 AM  Result Value Ref Range   Potassium 4.4 3.5 - 5.1 mmol/L    Comment: Performed at [facility], [place] [address], [place], [place] [address]  Creatinine, serum     Status: Abnormal   Collection Time: 05/27/20  5:48 AM  Result Value Ref Range   Creatinine, Ser 2.74 (H) 0.61 - 1.24 mg/dL   GFR, Estimated 24 (L) >60 mL/min    Comment: (NOTE) Calculated using the CKD-EPI Creatinine Equation (2021) Performed at [facility], [place] [address] [place], [place] [address]   Hemoglobin     Status: Abnormal   Collection Time: 05/27/20  5:48 AM  Result Value Ref Range   Hemoglobin 8.9 (L) 13.0 - 17.0 g/dL    Comment: Performed at [facility], [place] [address] [place], [place] [address]   Glucose, capillary     Status: Abnormal   Collection Time: 05/27/20  8:02 AM  Result Value Ref Range   Glucose-Capillary 130 (H) 70 - 99 mg/dL    Comment: Glucose reference range applies only to samples taken after fasting for at least 8 hours.    Imaging / Studies: No results found.  Medications / Allergies: per chart  Antibiotics: Anti-infectives (From admission, onward)   Start     Dose/Rate Route Frequency Ordered Stop   05/25/20 2200  cefoTEtan (CEFOTAN) 2 g in sodium chloride 0.9 % 100 mL IVPB        2 g 200 mL/hr over 30 Minutes Intravenous Every 12 hours 05/25/20 1741 05/25/20 2134   05/25/20 1400  neomycin (MYCIFRADIN) tablet 1,000 mg  Status:  Discontinued       "And" Linked Group Details   1,000 mg Oral 3 times per day 05/25/20 0527 05/25/20 0532   05/25/20 1400  metroNIDAZOLE (FLAGYL) tablet 1,000 mg  Status:  Discontinued       "And" Linked Group Details   1,000 mg Oral 3 times per day 05/25/20 0527 05/25/20 0532   05/25/20 0600  cefoTEtan (CEFOTAN) 2 g in sodium chloride 0.9 % 100 mL IVPB        2 g 200 mL/hr over 30 Minutes Intravenous On call to O.R. 05/25/20 0527 05/25/20 0803        Note: Portions of this report may have been transcribed using voice recognition software. Every effort was made to ensure accuracy; however, inadvertent computerized transcription errors may be present.   Any transcriptional errors that result from this process are unintentional.    [name], MD, FACS, MASCRS  Esophageal, Gastrointestinal & Colorectal Surgery Robotic and Minimally Invasive Surgery [facility] [address] [place], [place] [address] [phone] Fax [phone] Main/Paging  CONTACT INFORMATION: Weekday (9AM-5PM) concerns: Call  CCS main office at [phone] Weeknight (5PM-9AM) or Weekend/Holiday concerns: Check [email or web address] for General Surgery [facility]CS coverage (Please, do not use SecureChat as it is not reliable  communication to operating surgeons for immediate patient care)      05/27/2020  8:44 AM

## 2020-05-28 ENCOUNTER — Inpatient Hospital Stay: Payer: Medicare Other

## 2020-05-28 LAB — CREATININE, SERUM
Creatinine, Ser: 2.78 mg/dL — ABNORMAL HIGH (ref 0.61–1.24)
GFR, Estimated: 24 mL/min — ABNORMAL LOW (ref >60)

## 2020-05-28 LAB — GLUCOSE, CAPILLARY
Glucose-Capillary: 140 mg/dL — ABNORMAL HIGH (ref 70–99)
Glucose-Capillary: 169 mg/dL — ABNORMAL HIGH (ref 70–99)
Glucose-Capillary: 134 mg/dL — ABNORMAL HIGH (ref 70–99)
Glucose-Capillary: 163 mg/dL — ABNORMAL HIGH (ref 70–99)

## 2020-05-28 LAB — POTASSIUM: Potassium: 4.6 mmol/L (ref 3.5–5.1)

## 2020-05-28 NOTE — TOC Initial Note (Addendum)
Transition of Care [facility]) - Initial/Assessment Note   Patient Details  Name: Christopher Delacruz MRN: [id number] Date of Birth: 04/12/1950  [facility]) CM/SW Contact:    Sherie Don, LCSW Phone Number: 05/28/2020, 9:51 AM  Clinical Narrative: Patient is a 70 year old male who was admitted for rectal cancer s/p robotic LAR resection. PT and OT evaluations recommended SNF. CSW spoke with patient's nephew/POA, Candida Peeling, regarding recommendations and POA is agreeable to SNF referral with a preference for [place] facilities. FL2 completed; PASRR verified. Initial referral faxed out. TOC awaiting bed offers.  Addendum: Patient received bed offer from [facility] and nephew is agreeable to facility. CSW confirmed with Tammy at [facility] that a bed will be available tomorrow pending negative COVID test. COVID test to be ordered.  Expected Discharge Plan: [place] Barriers to Discharge: Continued Medical Work up  Patient Goals and CMS Choice Patient states their goals for this hospitalization and ongoing recovery are:: Discharge to rehab in [facility]gov Compare Post Acute Care list provided to:: Patient Represenative (must comment) Candida Peeling (nephew/POA)) Choice offered to / list presented to : [place] / Guardian  Expected Discharge Plan and Services Expected Discharge Plan: [place] In-house Referral: Clinical Social Work Post Acute Care Choice: [place] Living arrangements for the past 2 months: [place]              DME Arranged: N/A DME Agency: NA  Prior Living Arrangements/Services Living arrangements for the past 2 months: [place] Lives with:: Facility Resident Patient language and need for interpreter reviewed:: Yes Do you feel safe going back to the place where you live?: Yes      Need for Family Participation in Patient Care: Yes (Comment) (Patient has cognitive  impairment) Care giver support system in place?: Yes (comment) Criminal Activity/Legal Involvement Pertinent to Current Situation/Hospitalization: No - Comment as needed  Activities of Daily Living Home Assistive Devices/Equipment: None ADL Screening (condition at time of admission) Patient's cognitive ability adequate to safely complete daily activities?: No Is the patient deaf or have difficulty hearing?: No Does the patient have difficulty seeing, even when wearing glasses/contacts?: No Does the patient have difficulty concentrating, remembering, or making decisions?: Yes Patient able to express need for assistance with ADLs?: Yes Does the patient have difficulty dressing or bathing?: Yes Independently performs ADLs?: No Does the patient have difficulty walking or climbing stairs?: Yes Weakness of Legs: Both Weakness of Arms/Hands: None  Permission Sought/Granted Permission sought to share information with : Chartered certified accountant granted to share information with : Yes, Verbal Permission Granted Permission granted to share info w AGENCY: SNFs  Emotional Assessment Appearance:: Appears stated age Attitude/Demeanor/Rapport: Engaged Affect (typically observed): Appropriate Orientation: : Oriented to Self,Oriented to Place Alcohol / Substance Use: Not Applicable Psych Involvement: No (comment)  Admission diagnosis:  Rectal cancer ([place]) [C20] Patient Active Problem List   Diagnosis Date Noted  . Anxiety disorder 05/27/2020  . Diabetic renal disease ([place]) 05/27/2020  . Hypothyroidism 05/27/2020  . Late effects of cerebrovascular disease 05/27/2020  . Vitamin D deficiency 05/27/2020  . Other vitamin B12 deficiency anemias 05/27/2020  . Expressive aphasia 05/27/2020  . Aortic atherosclerosis ([place]) 05/03/2020  . Port-A-Cath in place 03/19/2020  . Anemia in stage 3b chronic kidney disease ([place]) 03/19/2020  . Pre-op evaluation 01/30/2020  . Aortic valve  regurgitation 01/30/2020  . Hypertension associated with diabetes ([place]) 01/30/2020  . Lower extremity  edema 01/30/2020  . CKD (chronic kidney disease) stage 3, GFR 30-59 ml/min (HCC) 01/03/2020  . History of CVA (cerebrovascular accident) 01/03/2020  . Cognitive impairment 09/27/2019  . Encounter for antineoplastic chemotherapy 09/27/2019  . Goals of care, counseling/discussion 09/22/2019  . Swelling of upper arm 09/22/2019  . B12 deficiency 09/17/2019  . Palliative care by specialist   . DNR (do not resuscitate) discussion   . Rectal cancer s/p robotic LAR resectrion 05/25/2020   . Helicobacter pylori gastritis   . Benign prostatic hyperplasia without lower urinary tract symptoms 09/07/2019  . Absolute anemia 09/07/2019  . Iron deficiency anemia due to chronic blood loss 09/07/2019  . Hyperlipidemia 03/21/2018  . Weakness generalized 06/26/2015  . Dysphagia 06/26/2015  . Essential hypertension, malignant 06/26/2015  . Hypokalemia 06/26/2015  . Cerebral infarction ([place]) 06/23/2015  . Diabetes ([place]) 06/23/2015  . HTN (hypertension) 06/23/2015   PCP:  [name], MD Pharmacy:  No Pharmacies Listed  Readmission Risk Interventions Readmission Risk Prevention Plan 05/28/2020  Transportation Screening Complete  HRI or Home Care Consult Complete  Social Work Consult for [place] Planning/Counseling Complete  Palliative Care Screening Not Applicable  Medication Review Press photographer) Complete  Some recent data might be hidden

## 2020-05-28 NOTE — Progress Notes (Signed)
Pt refused mobility 

## 2020-05-28 NOTE — NC FL2 (Signed)
[place] LEVEL OF CARE SCREENING TOOL     IDENTIFICATION  Patient Name: Christopher Delacruz Birthdate: 1950/10/04 Sex: male Admission Date (Current Location): 05/25/2020  [place] and [place] Number:  [name] [id number] [place] and Address:  [facility],  [address] [place], [place]      Provider Number: [id number]  Attending Physician Name and Address:  [name], MD  Relative Name and Phone Number:  Milen Lengacher (nephew) Ph: [phone]    Current Level of Care: Hospital Recommended Level of Care: [place] Prior Approval Number:    Date Approved/Denied:   PASRR Number: [id number]  Discharge Plan: SNF    Current Diagnoses: Patient Active Problem List   Diagnosis Date Noted  . Anxiety disorder 05/27/2020  . Diabetic renal disease ([place]) 05/27/2020  . Hypothyroidism 05/27/2020  . Late effects of cerebrovascular disease 05/27/2020  . Vitamin D deficiency 05/27/2020  . Other vitamin B12 deficiency anemias 05/27/2020  . Expressive aphasia 05/27/2020  . Aortic atherosclerosis ([place]) 05/03/2020  . Port-A-Cath in place 03/19/2020  . Anemia in stage 3b chronic kidney disease ([place]) 03/19/2020  . Pre-op evaluation 01/30/2020  . Aortic valve regurgitation 01/30/2020  . Hypertension associated with diabetes ([place]) 01/30/2020  . Lower extremity edema 01/30/2020  . CKD (chronic kidney disease) stage 3, GFR 30-59 ml/min (HCC) 01/03/2020  . History of CVA (cerebrovascular accident) 01/03/2020  . Cognitive impairment 09/27/2019  . Encounter for antineoplastic chemotherapy 09/27/2019  . Goals of care, counseling/discussion 09/22/2019  . Swelling of upper arm 09/22/2019  . B12 deficiency 09/17/2019  . Palliative care by specialist   . DNR (do not resuscitate) discussion   . Rectal cancer s/p robotic LAR resectrion 05/25/2020   . Helicobacter pylori gastritis   . Benign prostatic hyperplasia without lower urinary tract symptoms  09/07/2019  . Absolute anemia 09/07/2019  . Iron deficiency anemia due to chronic blood loss 09/07/2019  . Hyperlipidemia 03/21/2018  . Weakness generalized 06/26/2015  . Dysphagia 06/26/2015  . Essential hypertension, malignant 06/26/2015  . Hypokalemia 06/26/2015  . Cerebral infarction ([place]) 06/23/2015  . Diabetes ([place]) 06/23/2015  . HTN (hypertension) 06/23/2015    Orientation RESPIRATION BLADDER Height & Weight     Self,Place  O2 (2L/min) Incontinent Weight: 248 lb 9.1 oz (112.8 kg) Height:  6\' 3"  (190.5 cm)  BEHAVIORAL SYMPTOMS/MOOD NEUROLOGICAL BOWEL NUTRITION STATUS      Continent Diet (Heart healthy)  AMBULATORY STATUS COMMUNICATION OF NEEDS Skin   Extensive Assist Verbally Surgical wounds                       Personal Care Assistance Level of Assistance  Bathing,Feeding,Dressing Bathing Assistance: Maximum assistance Feeding assistance: Limited assistance Dressing Assistance: Maximum assistance     Functional Limitations Info  Sight,Hearing,Speech Sight Info: Adequate Hearing Info: Adequate Speech Info: Impaired (Patient has expressive aphasia)    SPECIAL CARE FACTORS FREQUENCY  PT (By licensed PT),OT (By licensed OT)     PT Frequency: 5x's/week OT Frequency: 5x's/week            Contractures Contractures Info: Not present    Additional Factors Info  Code Status,Allergies,Psychotropic,Insulin Sliding Scale Code Status Info: Full Allergies Info: NKA Psychotropic Info: Zoloft (sertraline) Insulin Sliding Scale Info: See discharge summary       Current Medications (05/28/2020):  This is the current hospital active medication list Current Facility-Administered Medications  Medication Dose Route Frequency Provider Last Rate Last Admin  . 0.9 %  sodium chloride  infusion  250 mL Intravenous PRN [name], MD      . acetaminophen (TYLENOL) tablet 1,000 mg  1,000 mg Oral Lajuana Ripple, MD   1,000 mg at 05/27/20 1746  . alum & mag  hydroxide-simeth (MAALOX/MYLANTA) 200-200-20 MG/5ML suspension 30 mL  30 mL Oral Q6H PRN [name], MD      . alvimopan (ENTEREG) capsule 12 mg  12 mg Oral BID [name], MD   12 mg at 05/26/20 2128  . amLODipine (NORVASC) tablet 5-10 mg  5-10 mg Oral Daily [name], MD   10 mg at 05/27/20 1106  . aspirin EC tablet 81 mg  81 mg Oral Daily [name], MD   81 mg at 05/27/20 1107  . Chlorhexidine Gluconate Cloth 2 % PADS 6 each  6 each Topical Once [name], MD      . cholecalciferol (VITAMIN D3) tablet 2,000 Units  2,000 Units Oral Daily [name], MD   2,000 Units at 05/27/20 1106  . diphenhydrAMINE (BENADRYL) 12.5 MG/5ML elixir 12.5 mg  12.5 mg Oral Q6H PRN [name], MD       Or  . diphenhydrAMINE (BENADRYL) injection 12.5 mg  12.5 mg Intravenous Q6H PRN [name], MD      . enalaprilat (VASOTEC) injection 0.625-1.25 mg  0.625-1.25 mg Intravenous Q6H PRN [name], MD      . feeding supplement (GLUCERNA SHAKE) (GLUCERNA SHAKE) liquid 237 mL  237 mL Oral BID BM [name], MD   237 mL at 05/27/20 1108  . finasteride (PROSCAR) tablet 5 mg  5 mg Oral Daily [name], MD   5 mg at 05/27/20 1107  . heparin injection 5,000 Units  5,000 Units Subcutaneous Gorden Harms, MD   5,000 Units at 05/27/20 2222  . HYDROmorphone (DILAUDID) injection 0.5-2 mg  0.5-2 mg Intravenous Q4H PRN [name], MD      . insulin aspart (novoLOG) injection 0-15 Units  0-15 Units Subcutaneous TID Carlyn Reichert [name], MD   2 Units at 05/28/20 [phone]  . insulin aspart (novoLOG) injection 0-5 Units  0-5 Units Subcutaneous QHS [name], MD      . lip balm (CARMEX) ointment 1 application  1 application Topical BID [name], MD   1 application at 64/40/34 2135  . magic mouthwash  15 mL Oral QID PRN [name], MD      . melatonin tablet 3 mg  3 mg Oral QHS PRN [name], MD   3 mg at 05/26/20 2128  . metoprolol tartrate (LOPRESSOR) injection 5 mg  5 mg Intravenous Q6H  PRN [name], MD      . multivitamin with minerals tablet 1 tablet  1 tablet Oral Daily [name], MD   1 tablet at 05/27/20 1107  . ondansetron (ZOFRAN) tablet 4 mg  4 mg Oral Q6H PRN [name], MD       Or  . ondansetron [facility]) injection 4 mg  4 mg Intravenous Q6H PRN [name], MD      . polycarbophil (FIBERCON) tablet 625 mg  625 mg Oral BID [name], MD   625 mg at 05/27/20 2222  . prochlorperazine (COMPAZINE) tablet 10 mg  10 mg Oral Q6H PRN [name], MD       Or  . prochlorperazine (COMPAZINE) injection 5-10 mg  5-10 mg Intravenous Q6H PRN [name], MD      . sertraline (ZOLOFT) tablet 25 mg  25 mg Oral QHS [name],  MD   25 mg at 05/27/20 2222  . simethicone (MYLICON) chewable tablet 40 mg  40 mg Oral Q6H PRN [name], MD   40 mg at 05/26/20 2128  . sodium chloride flush (NS) 0.9 % injection 3 mL  3 mL Intravenous Gorden Harms, MD   3 mL at 05/27/20 2135  . sodium chloride flush (NS) 0.9 % injection 3 mL  3 mL Intravenous PRN [name], MD      . traMADol Veatrice Bourbon) tablet 50-100 mg  50-100 mg Oral Q6H PRN [name], MD   100 mg at 05/26/20 2128  . vitamin B-12 (CYANOCOBALAMIN) tablet 1,000 mcg  1,000 mcg Oral Daily [name], MD   1,000 mcg at 05/27/20 1109   Facility-Administered Medications Ordered in Other Encounters  Medication Dose Route Frequency Provider Last Rate Last Admin  . sodium chloride flush (NS) 0.9 % injection 10 mL  10 mL Intravenous PRN [name], MD   10 mL at 03/19/20 1015     Discharge Medications: Please see discharge summary for a list of discharge medications.  Relevant Imaging Results:  Relevant Lab Results:   Additional Information SSN: [id number]  Sherie Don, LCSW

## 2020-05-28 NOTE — Op Note (Signed)
NAMEMOISHY, LADAY MEDICAL RECORD NO: [id number] ACCOUNT NO: [id number] DATE OF BIRTH: [birth year] FACILITY: [facility] LOCATION: [id number] PHYSICIAN: Alexis Frock, MD  Operative Report   DATE OF PROCEDURE: 05/25/2020  PREOPERATIVE DIAGNOSIS:  Rectal cancer, undergoing resection, need for perioperative stents.  PROCEDURES:  1.  Cystoscopy with bilateral retrograde pyelograms and interpretation. 2.  Insertion of bilateral ureteral stents; right green, left yellow.  ESTIMATED BLOOD LOSS:  Nil.  COMPLICATIONS:  None.  SPECIMENS:  None.  FINDINGS:    1.  Very firm and fixed prostate consistent with pelvic radiation. 2.  Unremarkable bilateral retrograde pyelograms. 3.  Successful placement of right ureteral stent in renal pelvis and left ureteral stent in the renal pelvis, externalized and tied to the Foley catheter.  INDICATIONS:  The patient is a 70 year old man with a history of rectal cancer.  He is status post radiation.  He is undergoing further treatment with extirpative surgery today.  General surgery team has requested perioperative stenting and retrogrades  with ICG dye for ureteral identification and protection.  Informed consent was signed and placed in the medical record.  DESCRIPTION OF PROCEDURE:  The patient being Christopher Delacruz verified, the procedure being cystoscopy, bilateral retrogrades, bilateral stent placement confirmed.  Procedure timeout was performed.  Intravenous antibiotics were administered.  General  endotracheal anesthesia was induced.  The patient was placed into a low lithotomy position.  Sterile field was created, prepped and draped the patient's penis, perineum, and proximal thighs using iodine.  Cystourethroscopy was performed using a 21-French  rigid cystoscope with offset lens.  Inspection of anterior and posterior urethra was unremarkable.  The prostate was quite fixed and firm into his pelvis consistent with likely prior radiation.  Inspection of the bladder  revealed no diverticula,  calcifications, or papillary lesions.  The right ureteral orifice was cannulated with a 6-French renal catheter and right retrograde pyelogram was obtained using a slurry of ICG dye and contrast.  Right retrograde pyelogram demonstrated single right  ureter, single system right kidney.  The open-ended catheter was advanced slowly to upper pole, set aside.  Similarly, left retrograde pyelogram was obtained using 5-French yellow open-ended catheter.  The left retrograde pyelogram demonstrated single  left ureter, single system left kidney.  No signs of infection are noted.  Similarly, the open-ended catheter was advanced to the upper pole.  Cystoscope was removed.  Final spot fluoroscopic imaging corroborated continued good placement of stents.   Foley catheter was placed per urethra to straight drain, and the stents were tied to this using a single silk tie, connected to a [employer].  Procedure was terminated.  The patient tolerated the procedure well with no immediate complications.  The  patient was taken to postanesthesia care unit in stable condition with plan for proceeding with his rectal resection.   [facility] D: 05/25/2020 8:28:17 am T: 05/25/2020 11:32:00 am  JOB: [id number]/ [id number]

## 2020-05-29 LAB — POTASSIUM: Potassium: 4.2 mmol/L (ref 3.5–5.1)

## 2020-05-29 LAB — CREATININE, SERUM
Creatinine, Ser: 2.33 mg/dL — ABNORMAL HIGH (ref 0.61–1.24)
GFR, Estimated: 29 mL/min — ABNORMAL LOW (ref >60)

## 2020-05-29 LAB — SARS CORONAVIRUS 2 (TAT 6-24 HRS): SARS Coronavirus 2: NEGATIVE

## 2020-05-29 LAB — GLUCOSE, CAPILLARY
Glucose-Capillary: 159 mg/dL — ABNORMAL HIGH (ref 70–99)
Glucose-Capillary: 151 mg/dL — ABNORMAL HIGH (ref 70–99)

## 2020-05-29 MED ORDER — TRAMADOL HCL 50 MG PO TABS: 50.0000 mg | ORAL_TABLET | Freq: Four times a day (QID) | ORAL | 0 refills | Status: DC | PRN

## 2020-05-29 NOTE — Care Management Important Message (Signed)
Important Message  Patient Details  IM Letter placed in Patients room. Name: Christopher Delacruz MRN: [id number] Date of Birth: 12/28/1950   Medicare Important Message Given:  Yes     Kerin Salen 05/29/2020, 10:20 AM

## 2020-05-29 NOTE — Discharge Summary (Signed)
Physician Discharge Summary    Patient ID: Christopher Delacruz MRN: [id number] DOB/AGE: 02-19-1950  70 y.o.  Patient Care Team: [name], MD as PCP - General (Family Medicine) Borders, Kirt Boys, NP as Nurse Practitioner (Hospice and Palliative Medicine) [name], MD as Consulting Physician (Oncology) [name], MD as Referring Physician (Radiation Oncology) [name], NP as Nurse Practitioner [name], RN as Oncology Nurse Navigator [name], MD as Consulting Physician (Neurology) [name], MD as Consulting Physician (Gastroenterology) [name], MD as Consulting Physician (Psychiatry) [name], MD as Consulting Physician (Colon and Rectal Surgery) [name], MD as Consulting Physician (Cardiology)  Admit date: 05/25/2020  Discharge date: 05/29/2020  Hospital Stay = 4 days    Discharge Diagnoses:  Principal Problem:   Rectal cancer s/p robotic LAR resectrion 05/25/2020 Active Problems:   Cognitive impairment   History of CVA (cerebrovascular accident)   Diabetes ([place])   HTN (hypertension)   Benign prostatic hyperplasia without lower urinary tract symptoms   B12 deficiency   CKD (chronic kidney disease) stage 3, GFR 30-59 ml/min ([place])   Hypertension associated with diabetes ([place])   Anemia in stage 3b chronic kidney disease ([place])   Expressive aphasia   4 Days Post-Op  05/25/2020  POST-OPERATIVE DIAGNOSIS:   RECTAL CANCER  SURGERY:  05/25/2020  Procedure(s): XI ROBOTIC ASSISTED LOWER ANTERIOR RECTOSIGMOID RESECTION WITH MOBILIZATION OF THE SPENIC FLEXURE, BILATERAL TAP BLOCK AND INTRAOPERATIVE ASSESMENT OF PERFUSION RIGID PROCTOSCOPY CYSTOSCOPY WITH FIREFLY INJECTIONS AND BILATERAL OPEN ENDED CATHETER PLACEMENT  SURGEON:    Surgeon(s): [name], MD Ileana Roup, MD Alexis Frock, MD  Consults:   Hospital Course:   The patient underwent the surgery above.  Postoperatively, the  patient gradually mobilized and advanced to a solid diet.  Pain and other symptoms were treated aggressively.    By the time of discharge, the patient was walking well the hallways, eating food, having flatus.  Pain was well-controlled on an oral medications.  Based on meeting discharge criteria and continuing to recover, I felt it was safe for the patient to be discharged from the hospital to further recover with close followup. Postoperative recommendations were discussed in detail.  They are written as well.  Discharged Condition: good  Discharge Exam: Blood pressure 132/77, pulse 72, temperature 98.9 F (37.2 C), temperature source Oral, resp. rate 17, height 6\' 3"  (1.905 m), weight 109.5 kg, SpO2 (!) 87 %.  General: Pt awake/alert/oriented x4 in No acute distress Eyes: PERRL, normal EOM.  Sclera clear.  No icterus Neuro: CN II-XII intact w/o focal sensory/motor deficits. Lymph: No head/neck/groin lymphadenopathy Psych:  No delerium/psychosis/paranoia.  Mild asphasia stable HENT: Normocephalic, Mucus membranes moist.  No thrush Neck: Supple, No tracheal deviation Chest: No chest wall pain w good excursion CV:  Pulses intact.  Regular rhythm MS: Normal AROM mjr joints.  No obvious deformity Abdomen: Soft.  Nondistended.  Nontender.  No evidence of peritonitis.  No incarcerated hernias. Ext:  SCDs BLE.  No mjr edema.  No cyanosis Skin: No petechiae / purpura   Disposition:    Contact information for follow-up providers    [name], MD. Schedule an appointment as soon as possible for a visit in 3 weeks.   Specialties: General Surgery, Colon and Rectal Surgery Why: To follow up after your operation Contact information: [place] [address] [phone]        [name], MD. Schedule an appointment as soon as possible for  a visit in 2 week(s).   Specialty: Oncology Contact information: [place]  [id number] [phone]            Contact information for after-discharge care    Destination    [facility] Preferred SNF .   Service: Skilled Nursing Contact information: [address] [place] [phone]                  Discharge disposition: 03-Skilled Nursing Facility       Discharge Instructions    Call MD for:   Complete by: As directed    FEVER > 101.5 F  (temperatures < 101.5 F are not significant)   Call MD for:   Complete by: As directed    FEVER > 101.5 F  (temperatures < 101.5 F are not significant)   Call MD for:  extreme fatigue   Complete by: As directed    Call MD for:  extreme fatigue   Complete by: As directed    Call MD for:  persistant dizziness or light-headedness   Complete by: As directed    Call MD for:  persistant dizziness or light-headedness   Complete by: As directed    Call MD for:  persistant nausea and vomiting   Complete by: As directed    Call MD for:  persistant nausea and vomiting   Complete by: As directed    Call MD for:  redness, tenderness, or signs of infection (pain, swelling, redness, odor or green/yellow discharge around incision site)   Complete by: As directed    Call MD for:  redness, tenderness, or signs of infection (pain, swelling, redness, odor or green/yellow discharge around incision site)   Complete by: As directed    Call MD for:  severe uncontrolled pain   Complete by: As directed    Call MD for:  severe uncontrolled pain   Complete by: As directed    Diet - low sodium heart healthy   Complete by: As directed    Start with a bland diet such as soups, liquids, starchy foods, low fat foods, etc. the first few days at home. Gradually advance to a solid, low-fat, high fiber diet by the end of the first week at home.   Add a fiber supplement to your diet (Metamucil, etc) If you feel full, bloated, or constipated, stay on a full liquid or pureed/blenderized  diet for a few days until you feel better and are no longer constipated.   Discharge instructions   Complete by: As directed    See Discharge Instructions If you are not getting better after two weeks or are noticing you are getting worse, contact our office [phone] for further advice.  We may need to adjust your medications, re-evaluate you in the office, send you to the emergency room, or see what other things we can do to help. The clinic staff is available to answer your questions during regular business hours (8:30am-5pm).  Please don't hesitate to call and ask to speak to one of our nurses for clinical concerns.    A surgeon from [facility] is always on call at the hospitals 24 hours/day If you have a medical emergency, go to the nearest emergency room or call 911.   Discharge instructions   Complete by: As directed    See Discharge Instructions If you are not getting better after two weeks or are noticing you are getting worse, contact our  office [phone] for further advice.  We may need to adjust your medications, re-evaluate you in the office, send you to the emergency room, or see what other things we can do to help. The clinic staff is available to answer your questions during regular business hours (8:30am-5pm).  Please don't hesitate to call and ask to speak to one of our nurses for clinical concerns.    A surgeon from [facility] is always on call at the hospitals 24 hours/day If you have a medical emergency, go to the nearest emergency room or call 911.   Discharge wound care:   Complete by: As directed    It is good for closed incisions and even open wounds to be washed every day.  Shower every day.  Short baths are fine.  Wash the incisions and wounds clean with soap & water.    You may leave closed incisions open to air if it is dry.   You may cover the incision with clean gauze & replace it after your daily shower for comfort.  TEGADERM:   You have clear gauze band-aid dressings over your closed incision(s).  Remove the dressings 3 days after surgery = 5/2 MONDAY   Discharge wound care:   Complete by: As directed    It is good for closed incisions and even open wounds to be washed every day.  Shower every day.  Short baths are fine.  Wash the incisions and wounds clean with soap & water.    You may leave closed incisions open to air if it is dry.   You may cover the incision with clean gauze & replace it after your daily shower for comfort.   Driving Restrictions   Complete by: As directed    You may drive when: - you are no longer taking narcotic prescription pain medication - you can comfortably wear a seatbelt - you can safely make sudden turns/stops without pain.   Driving Restrictions   Complete by: As directed    You may drive when: - you are no longer taking narcotic prescription pain medication - you can comfortably wear a seatbelt - you can safely make sudden turns/stops without pain.   Increase activity slowly   Complete by: As directed    Start light daily activities --- self-care, walking, climbing stairs- beginning the day after surgery.  Gradually increase activities as tolerated.  Control your pain to be active.  Stop when you are tired.  Ideally, walk several times a day, eventually an hour a day.   Most people are back to most day-to-day activities in a few weeks.  It takes 4-6 weeks to get back to unrestricted, intense activity. If you can walk 30 minutes without difficulty, it is safe to try more intense activity such as jogging, treadmill, bicycling, low-impact aerobics, swimming, etc. Save the most intensive and strenuous activity for last (Usually 4-8 weeks after surgery) such as sit-ups, heavy lifting, contact sports, etc.  Refrain from any intense heavy lifting or straining until you are off narcotics for pain control.  You will have off days, but things should improve week-by-week. DO NOT PUSH THROUGH  PAIN.  Let pain be your guide: If it hurts to do something, don't do it.   Increase activity slowly   Complete by: As directed    Start light daily activities --- self-care, walking, climbing stairs- beginning the day after surgery.  Gradually increase activities as tolerated.  Control your pain to be active.  Stop when you are tired.  Ideally, walk several times a day, eventually an hour a day.   Most people are back to most day-to-day activities in a few weeks.  It takes 4-6 weeks to get back to unrestricted, intense activity. If you can walk 30 minutes without difficulty, it is safe to try more intense activity such as jogging, treadmill, bicycling, low-impact aerobics, swimming, etc. Save the most intensive and strenuous activity for last (Usually 4-8 weeks after surgery) such as sit-ups, heavy lifting, contact sports, etc.  Refrain from any intense heavy lifting or straining until you are off narcotics for pain control.  You will have off days, but things should improve week-by-week. DO NOT PUSH THROUGH PAIN.  Let pain be your guide: If it hurts to do something, don't do it.   Lifting restrictions   Complete by: As directed    If you can walk 30 minutes without difficulty, it is safe to try more intense activity such as jogging, treadmill, bicycling, low-impact aerobics, swimming, etc. Save the most intensive and strenuous activity for last (Usually 4-8 weeks after surgery) such as sit-ups, heavy lifting, contact sports, etc.   Refrain from any intense heavy lifting or straining until you are off narcotics for pain control.  You will have off days, but things should improve week-by-week. DO NOT PUSH THROUGH PAIN.  Let pain be your guide: If it hurts to do something, don't do it.  Pain is your body warning you to avoid that activity for another week until the pain goes down.   Lifting restrictions   Complete by: As directed    If you can walk 30 minutes without difficulty, it is safe to try more  intense activity such as jogging, treadmill, bicycling, low-impact aerobics, swimming, etc. Save the most intensive and strenuous activity for last (Usually 4-8 weeks after surgery) such as sit-ups, heavy lifting, contact sports, etc.   Refrain from any intense heavy lifting or straining until you are off narcotics for pain control.  You will have off days, but things should improve week-by-week. DO NOT PUSH THROUGH PAIN.  Let pain be your guide: If it hurts to do something, don't do it.  Pain is your body warning you to avoid that activity for another week until the pain goes down.   May shower / Bathe   Complete by: As directed    May shower / Bathe   Complete by: As directed    May walk up steps   Complete by: As directed    May walk up steps   Complete by: As directed    Remove dressing in 72 hours   Complete by: As directed    Make sure all dressings are removed by the third day after surgery.  Leave incisions open to air.  OK to cover incisions with gauze or bandages as desired   Sexual Activity Restrictions   Complete by: As directed    You may have sexual intercourse when it is comfortable. If it hurts to do something, stop.   Sexual Activity Restrictions   Complete by: As directed    You may have sexual intercourse when it is comfortable. If it hurts to do something, stop.      Allergies as of 05/29/2020   No Known Allergies     Medication List    TAKE these medications   amLODipine 10 MG tablet Commonly known as: NORVASC Take 1 tablet (10 mg total) by mouth daily.   aspirin  81 MG EC tablet Take 1 tablet (81 mg total) by mouth daily. Swallow whole.   clopidogrel 75 MG tablet Commonly known as: PLAVIX Take 75 mg by mouth daily.   ferrous sulfate 325 (65 FE) MG tablet Take 1 tablet (325 mg total) by mouth daily.   finasteride 5 MG tablet Commonly known as: PROSCAR Take 5 mg by mouth daily.   hydrochlorothiazide 12.5 MG tablet Commonly known as: HYDRODIURIL Take  12.5 mg by mouth daily.   multivitamin Tabs tablet Take 1 tablet by mouth daily.   ondansetron 8 MG tablet Commonly known as: Zofran Take 1 tablet (8 mg total) by mouth 2 (two) times daily as needed for refractory nausea / vomiting. Start on day 3 after chemotherapy. What changed:   how much to take  reasons to take this  additional instructions   prochlorperazine 10 MG tablet Commonly known as: COMPAZINE Take 10 mg by mouth every 6 (six) hours as needed for nausea or vomiting.   sertraline 25 MG tablet Commonly known as: ZOLOFT Take 25 mg by mouth at bedtime.   traMADol 50 MG tablet Commonly known as: ULTRAM Take 1-2 tablets (50-100 mg total) by mouth every 6 (six) hours as needed for moderate pain.   vitamin B-12 1000 MCG tablet Commonly known as: CYANOCOBALAMIN Take 1,000 mcg by mouth daily.   Vitamin D3 50 MCG (2000 UT) capsule Take 2,000 Units by mouth daily.            Discharge Care Instructions  (From admission, onward)         Start     Ordered   05/29/20 0000  Discharge wound care:       Comments: It is good for closed incisions and even open wounds to be washed every day.  Shower every day.  Short baths are fine.  Wash the incisions and wounds clean with soap & water.    You may leave closed incisions open to air if it is dry.   You may cover the incision with clean gauze & replace it after your daily shower for comfort.   05/29/20 0928   05/25/20 0000  Discharge wound care:       Comments: It is good for closed incisions and even open wounds to be washed every day.  Shower every day.  Short baths are fine.  Wash the incisions and wounds clean with soap & water.    You may leave closed incisions open to air if it is dry.   You may cover the incision with clean gauze & replace it after your daily shower for comfort.  TEGADERM:  You have clear gauze band-aid dressings over your closed incision(s).  Remove the dressings 3 days after surgery = 5/2 [facility]    05/25/20 0732          Significant Diagnostic Studies:  Results for orders placed or performed during the hospital encounter of 05/25/20 (from the past 72 hour(s))  Glucose, capillary     Status: Abnormal   Collection Time: 05/26/20 11:35 AM  Result Value Ref Range   Glucose-Capillary 185 (H) 70 - 99 mg/dL    Comment: Glucose reference range applies only to samples taken after fasting for at least 8 hours.  Glucose, capillary     Status: Abnormal   Collection Time: 05/26/20  4:50 PM  Result Value Ref Range   Glucose-Capillary 143 (H) 70 - 99 mg/dL    Comment: Glucose reference range applies only to samples taken  after fasting for at least 8 hours.  Glucose, capillary     Status: Abnormal   Collection Time: 05/26/20 10:27 PM  Result Value Ref Range   Glucose-Capillary 145 (H) 70 - 99 mg/dL    Comment: Glucose reference range applies only to samples taken after fasting for at least 8 hours.  Potassium     Status: None   Collection Time: 05/27/20  5:48 AM  Result Value Ref Range   Potassium 4.4 3.5 - 5.1 mmol/L    Comment: Performed at [facility], [place] [address], [place], [place] [address]  Creatinine, serum     Status: Abnormal   Collection Time: 05/27/20  5:48 AM  Result Value Ref Range   Creatinine, Ser 2.74 (H) 0.61 - 1.24 mg/dL   GFR, Estimated 24 (L) >60 mL/min    Comment: (NOTE) Calculated using the CKD-EPI Creatinine Equation (2021) Performed at [facility], [place] [address], [place], [place] [address]   Hemoglobin     Status: Abnormal   Collection Time: 05/27/20  5:48 AM  Result Value Ref Range   Hemoglobin 8.9 (L) 13.0 - 17.0 g/dL    Comment: Performed at [facility], [place] [address], [place], [place] [address]  Glucose, capillary     Status: Abnormal   Collection Time: 05/27/20  8:02 AM  Result Value Ref Range   Glucose-Capillary 130 (H) 70 - 99 mg/dL    Comment: Glucose reference range applies only  to samples taken after fasting for at least 8 hours.  Glucose, capillary     Status: Abnormal   Collection Time: 05/27/20 11:57 AM  Result Value Ref Range   Glucose-Capillary 136 (H) 70 - 99 mg/dL    Comment: Glucose reference range applies only to samples taken after fasting for at least 8 hours.  Glucose, capillary     Status: Abnormal   Collection Time: 05/27/20  4:53 PM  Result Value Ref Range   Glucose-Capillary 127 (H) 70 - 99 mg/dL    Comment: Glucose reference range applies only to samples taken after fasting for at least 8 hours.  Glucose, capillary     Status: Abnormal   Collection Time: 05/27/20 10:25 PM  Result Value Ref Range   Glucose-Capillary 136 (H) 70 - 99 mg/dL    Comment: Glucose reference range applies only to samples taken after fasting for at least 8 hours.  Potassium     Status: None   Collection Time: 05/28/20  3:18 AM  Result Value Ref Range   Potassium 4.6 3.5 - 5.1 mmol/L    Comment: Performed at [facility], [place] [address] [place], [place] [address]  Creatinine, serum     Status: Abnormal   Collection Time: 05/28/20  3:18 AM  Result Value Ref Range   Creatinine, Ser 2.78 (H) 0.61 - 1.24 mg/dL   GFR, Estimated 24 (L) >60 mL/min    Comment: (NOTE) Calculated using the CKD-EPI Creatinine Equation (2021) Performed at [facility], [place] [address], [place], [place] [address]   Glucose, capillary     Status: Abnormal   Collection Time: 05/28/20  7:57 AM  Result Value Ref Range   Glucose-Capillary 140 (H) 70 - 99 mg/dL    Comment: Glucose reference range applies only to samples taken after fasting for at least 8 hours.  Glucose, capillary     Status: Abnormal   Collection Time: 05/28/20 12:05 PM  Result Value Ref Range   Glucose-Capillary  169 (H) 70 - 99 mg/dL    Comment: Glucose reference range applies only to samples taken after fasting for at least 8 hours.  Glucose, capillary     Status: Abnormal   Collection  Time: 05/28/20  4:45 PM  Result Value Ref Range   Glucose-Capillary 134 (H) 70 - 99 mg/dL    Comment: Glucose reference range applies only to samples taken after fasting for at least 8 hours.  Glucose, capillary     Status: Abnormal   Collection Time: 05/28/20  8:14 PM  Result Value Ref Range   Glucose-Capillary 163 (H) 70 - 99 mg/dL    Comment: Glucose reference range applies only to samples taken after fasting for at least 8 hours.  SARS CORONAVIRUS 2 (TAT 6-24 HRS) Nasopharyngeal Nasopharyngeal Swab     Status: None   Collection Time: 05/28/20  8:15 PM   Specimen: Nasopharyngeal Swab  Result Value Ref Range   SARS Coronavirus 2 NEGATIVE NEGATIVE    Comment: (NOTE) SARS-CoV-2 target nucleic acids are NOT DETECTED.  The SARS-CoV-2 RNA is generally detectable in upper and lower respiratory specimens during the acute phase of infection. Negative results do not preclude SARS-CoV-2 infection, do not rule out co-infections with other pathogens, and should not be used as the sole basis for treatment or other patient management decisions. Negative results must be combined with clinical observations, patient history, and epidemiological information. The expected result is Negative.  Fact Sheet for Patients: SugarRoll.be  Fact Sheet for Healthcare Providers: https://www.woods-mathews.com/  This test is not yet approved or cleared by the [place] FDA and  has been authorized for detection and/or diagnosis of SARS-CoV-2 by FDA under an Emergency Use Authorization (EUA). This EUA will remain  in effect (meaning this test can be used) for the duration of the COVID-19 declaration under Se ction 564(b)(1) of the Act, 21 U.S.C. section 360bbb-3(b)(1), unless the authorization is terminated or revoked sooner.  Performed at [facility], [place] [address] [place], [place] [address]   Potassium     Status: None   Collection Time: 05/29/20   3:16 AM  Result Value Ref Range   Potassium 4.2 3.5 - 5.1 mmol/L    Comment: Performed at [facility], [place] [address], [place], [place] [address]  Creatinine, serum     Status: Abnormal   Collection Time: 05/29/20  3:16 AM  Result Value Ref Range   Creatinine, Ser 2.33 (H) 0.61 - 1.24 mg/dL   GFR, Estimated 29 (L) >60 mL/min    Comment: (NOTE) Calculated using the CKD-EPI Creatinine Equation (2021) Performed at [facility], [place] [address] [place], [place] [address]   Glucose, capillary     Status: Abnormal   Collection Time: 05/29/20  7:23 AM  Result Value Ref Range   Glucose-Capillary 159 (H) 70 - 99 mg/dL    Comment: Glucose reference range applies only to samples taken after fasting for at least 8 hours.    No results found.  Past Medical History:  Diagnosis Date  . Acute renal failure superimposed on stage 3a chronic kidney disease ([place]) 09/07/2019  . B12 deficiency 09/17/2019  . Cancer [facility])    rectal cancer   . CVA (cerebral vascular accident) ([place])   . Diabetes mellitus ([place])    type 2 - controlled by diet on no meds   . Heart murmur   . Hyperlipidemia   . Hypertension   . Right hemiparesis ([place]) 2017   Associated  with stroke    Past Surgical History:  Procedure Laterality Date  . COLONOSCOPY N/A 09/11/2019   Procedure: COLONOSCOPY;  Surgeon: [name], MD;  Location: [facility] ENDOSCOPY;  Service: Endoscopy;  Laterality: N/A;  . COLONOSCOPY WITH PROPOFOL N/A 09/12/2019   Procedure: COLONOSCOPY WITH PROPOFOL;  Surgeon: [name], MD;  Location: [facility] ENDOSCOPY;  Service: Gastroenterology;  Laterality: N/A;  . COLONOSCOPY WITH PROPOFOL N/A 09/13/2019   Procedure: COLONOSCOPY WITH PROPOFOL;  Surgeon: [name], MD;  Location: [facility] ENDOSCOPY;  Service: Gastroenterology;  Laterality: N/A;  . CYSTOSCOPY N/A 05/25/2020   Procedure: CYSTOSCOPY WITH FIREFLY INJECTIONS AND BILATERAL OPEN ENDED CATHETER PLACEMENT;  Surgeon: Alexis Frock, MD;  Location: [facility] ORS;  Service: Urology;  Laterality: N/A;  . ESOPHAGOGASTRODUODENOSCOPY N/A 09/11/2019   Procedure: ESOPHAGOGASTRODUODENOSCOPY (EGD);  Surgeon: [name], MD;  Location: [facility] ENDOSCOPY;  Service: Endoscopy;  Laterality: N/A;  . EXPLORE EYE SOCKET    . PORTACATH PLACEMENT N/A 09/15/2019   Procedure: INSERTION PORT-A-CATH;  Surgeon: Jules Husbands, MD;  Location: [facility] ORS;  Service: General;  Laterality: N/A;  . PROCTOSCOPY N/A 05/25/2020   Procedure: RIGID PROCTOSCOPY;  Surgeon: [name], MD;  Location: [facility] ORS;  Service: General;  Laterality: N/A;  . XI ROBOTIC ASSISTED LOWER ANTERIOR RESECTION N/A 05/25/2020   Procedure: XI ROBOTIC ASSISTED LOWER ANTERIOR RECTOSIGMOID RESECTION WITH MOBILIZATION OF THE SPENIC FLEXURE, BILATERAL TAP BLOCK AND INTRAOPERATIVE ASSESMENT OF PERFUSION;  Surgeon: [name], MD;  [facility]: [facility];  Service: General;  Laterality: N/A;    Social History   Socioeconomic History  . Marital status: Divorced    Spouse name: Not on file  . Number of children: Not on file  . Years of education: Not on file  . Highest education level: Not on file  Occupational History  . 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 Smoking status: Never Smoker  . Smokeless tobacco: Never Used  Vaping Use  . Vaping Use: Never used  Substance and Sexual Activity  . Alcohol use: Never    Alcohol/week: 0.0 standard drinks  . Drug use: Never  . Sexual activity: Not on file  Other Topics Concern  . Not on file  Social History Narrative   Nephew with [place] [facility] due to cognitive impairment of patient after stroke 2017      Lives in [place] in [place], [place]   Social Determinants of Health   Financial Resource Strain: Not on file  Food Insecurity: Not on file  Transportation Needs: Unmet Transportation Needs  . Lack of Transportation  (Medical): Yes  . Lack of Transportation (Non-Medical): Yes  Physical Activity: Not on file  Stress: Not on file  Social Connections: Not on file  Intimate Partner Violence: Not on file    Family History  Problem Relation Age of Onset  . Heart attack Sister   . Leukemia Brother   . Stroke Brother     Current Facility-Administered Medications  Medication Dose Route Frequency Provider Last Rate Last Admin  . 0.9 %  sodium chloride infusion  250 mL Intravenous PRN [name], MD      . acetaminophen (TYLENOL) tablet 1,000 mg  1,000 mg Oral Lajuana Ripple, MD   1,000 mg at 05/29/20 0552  . alum & mag hydroxide-simeth (MAALOX/MYLANTA) 200-200-20 MG/5ML suspension 30 mL  30 mL Oral Q6H PRN [name], MD      .  amLODipine (NORVASC) tablet 5-10 mg  5-10 mg Oral Daily [name], MD   5 mg at 05/28/20 1010  . aspirin EC tablet 81 mg  81 mg Oral Daily [name], MD   81 mg at 05/28/20 1011  . Chlorhexidine Gluconate Cloth 2 % PADS 6 each  6 each Topical Once [name], MD      . cholecalciferol (VITAMIN D3) tablet 2,000 Units  2,000 Units Oral Daily [name], MD   2,000 Units at 05/28/20 1012  . diphenhydrAMINE (BENADRYL) 12.5 MG/5ML elixir 12.5 mg  12.5 mg Oral Q6H PRN [name], MD       Or  . diphenhydrAMINE (BENADRYL) injection 12.5 mg  12.5 mg Intravenous Q6H PRN [name], MD      . enalaprilat (VASOTEC) injection 0.625-1.25 mg  0.625-1.25 mg Intravenous Q6H PRN [name], MD      . feeding supplement (GLUCERNA SHAKE) (GLUCERNA SHAKE) liquid 237 mL  237 mL Oral BID BM [name], MD   237 mL at 05/28/20 1615  . finasteride (PROSCAR) tablet 5 mg  5 mg Oral Daily [name], MD   5 mg at 05/28/20 1011  . heparin injection 5,000 Units  5,000 Units Subcutaneous Gorden Harms, MD   5,000 Units at 05/28/20 2204  . HYDROmorphone (DILAUDID) injection 0.5-2 mg  0.5-2 mg Intravenous Q4H PRN [name], MD      . insulin aspart (novoLOG) injection  0-15 Units  0-15 Units Subcutaneous TID Carlyn Reichert [name], MD   3 Units at 05/29/20 [phone]  . insulin aspart (novoLOG) injection 0-5 Units  0-5 Units Subcutaneous QHS [name], MD      . lip balm (CARMEX) ointment 1 application  1 application Topical BID [name], MD   1 application at 40/98/11 2204  . magic mouthwash  15 mL Oral QID PRN [name], MD      . melatonin tablet 3 mg  3 mg Oral QHS PRN [name], MD   3 mg at 05/26/20 2128  . metoprolol tartrate (LOPRESSOR) injection 5 mg  5 mg Intravenous Q6H PRN [name], MD      . multivitamin with minerals tablet 1 tablet  1 tablet Oral Daily [name], MD   1 tablet at 05/28/20 1013  . ondansetron (ZOFRAN) tablet 4 mg  4 mg Oral Q6H PRN [name], MD       Or  . ondansetron [facility]) injection 4 mg  4 mg Intravenous Q6H PRN [name], MD      . polycarbophil (FIBERCON) tablet 625 mg  625 mg Oral BID [name], MD   625 mg at 05/28/20 2203  . prochlorperazine (COMPAZINE) tablet 10 mg  10 mg Oral Q6H PRN [name], MD       Or  . prochlorperazine (COMPAZINE) injection 5-10 mg  5-10 mg Intravenous Q6H PRN [name], MD      . sertraline (ZOLOFT) tablet 25 mg  25 mg Oral Ardeen Fillers, MD   25 mg at 05/28/20 2203  . simethicone (MYLICON) chewable tablet 40 mg  40 mg Oral Q6H PRN [name], MD   40 mg at 05/28/20 2203  . sodium chloride flush (NS) 0.9 % injection 3 mL  3 mL Intravenous Gorden Harms, MD   3 mL at 05/28/20 2204  . sodium chloride flush (NS) 0.9 % injection 3 mL  3 mL Intravenous PRN [name], MD      . traMADol Veatrice Bourbon)  tablet 50-100 mg  50-100 mg Oral Q6H PRN [name], MD   100 mg at 05/26/20 2128  . vitamin B-12 (CYANOCOBALAMIN) tablet 1,000 mcg  1,000 mcg Oral Daily [name], MD   1,000 mcg at 05/28/20 1013   Facility-Administered Medications Ordered in Other Encounters  Medication Dose Route Frequency Provider Last Rate Last Admin  . sodium chloride flush  (NS) 0.9 % injection 10 mL  10 mL Intravenous PRN [name], MD   10 mL at 03/19/20 1015     No Known Allergies  Signed: Morton Peters, MD, FACS, MASCRS  Esophageal, Gastrointestinal & Colorectal Surgery Robotic and Minimally Invasive Surgery [facility] [address], [place], [place] [address] [phone] Fax [phone] Main/Paging  CONTACT INFORMATION: Weekday (9AM-5PM) concerns: Call [facility] main office at [phone] Weeknight (5PM-9AM) or Weekend/Holiday concerns: Check [email or web address] for General Surgery [facility] coverage (Please, do not use SecureChat as it is not reliable communication to operating surgeons for immediate patient care)      05/29/2020, 9:28 AM

## 2020-05-29 NOTE — TOC Transition Note (Signed)
Transition of Care [facility]) - CM/SW Discharge Note  Patient Details  Name: Christopher Delacruz MRN: [id number] Date of Birth: 05-Nov-1950  Transition of Care [facility]) CM/SW Contact:  Sherie Don, LCSW Phone Number: 05/29/2020, 10:46 AM  Clinical Narrative: Patient is medically ready for discharge. CSW spoke with Tammy at [facility] and patient can come today. The number for report is [phone]. Nephew will complete admission paperwork and provide copy of COVID vaccines to facility. Patient's COVID test is negative. Discharge summary, discharge orders, and SNF transfer report faxed to facility in hub. Medical necessity form done; PTAR scheduled. Discharge packet complete. RN updated. TOC signing off.  Final next level of care: [place] Barriers to Discharge: Barriers Resolved  Patient Goals and CMS Choice Patient states their goals for this hospitalization and ongoing recovery are:: Discharge to rehab in [facility].gov Compare Post Acute Care list provided to:: Patient Represenative (must comment) Choice offered to / list presented to : [place] / [place]  Discharge Placement Existing PASRR number confirmed : 05/28/20          Patient chooses bed at: [facility] [place] Patient to be transferred to facility by: [place] Name of family member notified: Burt Piatek Patient and family notified of of transfer: 05/29/20  Discharge Plan and Services In-house Referral: Clinical Social Work Post Acute Care Choice: [place]          DME Arranged: N/A DME Agency: NA  Readmission Risk Interventions Readmission Risk Prevention Plan 05/28/2020  Transportation Screening Complete  HRI or Home Care Consult Complete  Social Work Consult for [place] Planning/Counseling Complete  Palliative Care Screening Not Applicable  Medication Review Press photographer) Complete  Some recent data might be hidden

## 2020-05-29 NOTE — Progress Notes (Signed)
Attempted to call [facility] [phone] for report, couldn't get anyone to answer. RN will put floor number in packet for facility to call.

## 2020-06-01 ENCOUNTER — Telehealth: Payer: Self-pay

## 2020-06-01 NOTE — Telephone Encounter (Signed)
Christopher Delacruz can you please schedule and notify facility of appt. Pt is a Printmaker pt.

## 2020-06-01 NOTE — Telephone Encounter (Signed)
Needs an appointment with Dr. Tasia Catchings in 2 weeks per his discharge summary. Rectal cancer s/p robotic LAR resection . He is currently at [facility] in [place]. Scheduling message sent.

## 2020-06-01 NOTE — Telephone Encounter (Signed)
06/01/2020 Appt made for 5/25, van scheduled as well. I called [facility] but their transportation coordinator is not there, I have a reminder to call them on Monday and let her know about his appt  SRW

## 2020-06-06 ENCOUNTER — Other Ambulatory Visit: Payer: Self-pay

## 2020-06-06 NOTE — Progress Notes (Signed)
The proposed treatment discussed in conference is for discussion purposes only and is not a binding recommendation.  The patients have not been physically examined, or presented with their treatment options.  Therefore, final treatment plans cannot be decided.   

## 2020-06-13 LAB — SURGICAL PATHOLOGY

## 2020-06-14 ENCOUNTER — Other Ambulatory Visit: Payer: Self-pay

## 2020-06-14 ENCOUNTER — Emergency Department: Payer: Medicare Other

## 2020-06-14 ENCOUNTER — Inpatient Hospital Stay
Admission: EM | Admit: 2020-06-14 | Discharge: 2020-06-27 | DRG: 309 | Disposition: A | Discharge status: Hospice | Payer: Medicare Other | Source: Skilled Nursing Facility | Attending: Internal Medicine | Admitting: Internal Medicine

## 2020-06-14 DIAGNOSIS — K922 Gastrointestinal hemorrhage, unspecified: Secondary | ICD-10-CM

## 2020-06-14 DIAGNOSIS — R4189 Other symptoms and signs involving cognitive functions and awareness: Secondary | ICD-10-CM | POA: Diagnosis present

## 2020-06-14 DIAGNOSIS — E785 Hyperlipidemia, unspecified: Secondary | ICD-10-CM | POA: Diagnosis present

## 2020-06-14 DIAGNOSIS — D500 Iron deficiency anemia secondary to blood loss (chronic): Secondary | ICD-10-CM | POA: Diagnosis present

## 2020-06-14 DIAGNOSIS — C20 Malignant neoplasm of rectum: Secondary | ICD-10-CM | POA: Diagnosis present

## 2020-06-14 DIAGNOSIS — D509 Iron deficiency anemia, unspecified: Secondary | ICD-10-CM | POA: Diagnosis present

## 2020-06-14 DIAGNOSIS — N400 Enlarged prostate without lower urinary tract symptoms: Secondary | ICD-10-CM | POA: Diagnosis not present

## 2020-06-14 DIAGNOSIS — I959 Hypotension, unspecified: Secondary | ICD-10-CM | POA: Diagnosis present

## 2020-06-14 DIAGNOSIS — Z9221 Personal history of antineoplastic chemotherapy: Secondary | ICD-10-CM

## 2020-06-14 DIAGNOSIS — Z9049 Acquired absence of other specified parts of digestive tract: Secondary | ICD-10-CM

## 2020-06-14 DIAGNOSIS — I129 Hypertensive chronic kidney disease with stage 1 through stage 4 chronic kidney disease, or unspecified chronic kidney disease: Secondary | ICD-10-CM | POA: Diagnosis present

## 2020-06-14 DIAGNOSIS — R000 Tachycardia, unspecified: Secondary | ICD-10-CM | POA: Insufficient documentation

## 2020-06-14 DIAGNOSIS — I69320 Aphasia following cerebral infarction: Secondary | ICD-10-CM

## 2020-06-14 DIAGNOSIS — Z20822 Contact with and (suspected) exposure to covid-19: Secondary | ICD-10-CM | POA: Diagnosis present

## 2020-06-14 DIAGNOSIS — Z79899 Other long term (current) drug therapy: Secondary | ICD-10-CM

## 2020-06-14 DIAGNOSIS — I4891 Unspecified atrial fibrillation: Principal | ICD-10-CM | POA: Diagnosis present

## 2020-06-14 DIAGNOSIS — F32A Depression, unspecified: Secondary | ICD-10-CM | POA: Diagnosis present

## 2020-06-14 DIAGNOSIS — R778 Other specified abnormalities of plasma proteins: Secondary | ICD-10-CM | POA: Diagnosis not present

## 2020-06-14 DIAGNOSIS — Z7982 Long term (current) use of aspirin: Secondary | ICD-10-CM

## 2020-06-14 DIAGNOSIS — Z8673 Personal history of transient ischemic attack (TIA), and cerebral infarction without residual deficits: Secondary | ICD-10-CM

## 2020-06-14 DIAGNOSIS — R7989 Other specified abnormal findings of blood chemistry: Secondary | ICD-10-CM | POA: Diagnosis present

## 2020-06-14 DIAGNOSIS — E1129 Type 2 diabetes mellitus with other diabetic kidney complication: Secondary | ICD-10-CM | POA: Diagnosis present

## 2020-06-14 DIAGNOSIS — Z79891 Long term (current) use of opiate analgesic: Secondary | ICD-10-CM

## 2020-06-14 DIAGNOSIS — E538 Deficiency of other specified B group vitamins: Secondary | ICD-10-CM | POA: Diagnosis present

## 2020-06-14 DIAGNOSIS — I10 Essential (primary) hypertension: Secondary | ICD-10-CM | POA: Diagnosis present

## 2020-06-14 DIAGNOSIS — D631 Anemia in chronic kidney disease: Secondary | ICD-10-CM | POA: Diagnosis present

## 2020-06-14 DIAGNOSIS — F0390 Unspecified dementia without behavioral disturbance: Secondary | ICD-10-CM | POA: Diagnosis present

## 2020-06-14 DIAGNOSIS — N184 Chronic kidney disease, stage 4 (severe): Secondary | ICD-10-CM | POA: Diagnosis not present

## 2020-06-14 DIAGNOSIS — I700 Atherosclerosis of aorta: Secondary | ICD-10-CM | POA: Diagnosis present

## 2020-06-14 DIAGNOSIS — F419 Anxiety disorder, unspecified: Secondary | ICD-10-CM | POA: Diagnosis present

## 2020-06-14 DIAGNOSIS — E039 Hypothyroidism, unspecified: Secondary | ICD-10-CM | POA: Diagnosis present

## 2020-06-14 DIAGNOSIS — I69351 Hemiplegia and hemiparesis following cerebral infarction affecting right dominant side: Secondary | ICD-10-CM

## 2020-06-14 DIAGNOSIS — Z66 Do not resuscitate: Secondary | ICD-10-CM | POA: Diagnosis present

## 2020-06-14 DIAGNOSIS — Z7902 Long term (current) use of antithrombotics/antiplatelets: Secondary | ICD-10-CM

## 2020-06-14 DIAGNOSIS — Z515 Encounter for palliative care: Secondary | ICD-10-CM

## 2020-06-14 DIAGNOSIS — Z8249 Family history of ischemic heart disease and other diseases of the circulatory system: Secondary | ICD-10-CM

## 2020-06-14 DIAGNOSIS — H9190 Unspecified hearing loss, unspecified ear: Secondary | ICD-10-CM | POA: Diagnosis present

## 2020-06-14 DIAGNOSIS — E1122 Type 2 diabetes mellitus with diabetic chronic kidney disease: Secondary | ICD-10-CM | POA: Diagnosis present

## 2020-06-14 DIAGNOSIS — D62 Acute posthemorrhagic anemia: Secondary | ICD-10-CM | POA: Diagnosis present

## 2020-06-14 DIAGNOSIS — Z823 Family history of stroke: Secondary | ICD-10-CM

## 2020-06-14 DIAGNOSIS — I4892 Unspecified atrial flutter: Secondary | ICD-10-CM | POA: Diagnosis present

## 2020-06-14 DIAGNOSIS — Z923 Personal history of irradiation: Secondary | ICD-10-CM

## 2020-06-14 LAB — PROTIME-INR
INR: 1.2 (ref 0.8–1.2)
Prothrombin Time: 15.1 seconds (ref 11.4–15.2)

## 2020-06-14 LAB — BASIC METABOLIC PANEL
Anion gap: 12 (ref 5–15)
BUN: 42 mg/dL — ABNORMAL HIGH (ref 8–23)
CO2: 24 mmol/L (ref 22–32)
Calcium: 9.3 mg/dL (ref 8.9–10.3)
Chloride: 102 mmol/L (ref 98–111)
Creatinine, Ser: 2.41 mg/dL — ABNORMAL HIGH (ref 0.61–1.24)
GFR, Estimated: 28 mL/min — ABNORMAL LOW (ref >60)
Glucose, Bld: 174 mg/dL — ABNORMAL HIGH (ref 70–99)
Potassium: 4 mmol/L (ref 3.5–5.1)
Sodium: 138 mmol/L (ref 135–145)

## 2020-06-14 LAB — TROPONIN I (HIGH SENSITIVITY)
Troponin I (High Sensitivity): 19 ng/L — ABNORMAL HIGH (ref <18)
Troponin I (High Sensitivity): 19 ng/L — ABNORMAL HIGH (ref <18)
Troponin I (High Sensitivity): 18 ng/L — ABNORMAL HIGH (ref <18)

## 2020-06-14 LAB — CBC
HCT: 31.2 % — ABNORMAL LOW (ref 39.0–52.0)
Hemoglobin: 9.7 g/dL — ABNORMAL LOW (ref 13.0–17.0)
MCH: 27.4 pg (ref 26.0–34.0)
MCHC: 31.1 g/dL (ref 30.0–36.0)
MCV: 88.1 fL (ref 80.0–100.0)
Platelets: 591 10*3/uL — ABNORMAL HIGH (ref 150–400)
RBC: 3.54 MIL/uL — ABNORMAL LOW (ref 4.22–5.81)
RDW: 13.2 % (ref 11.5–15.5)
WBC: 9.5 10*3/uL (ref 4.0–10.5)
nRBC: 0 % (ref 0.0–0.2)

## 2020-06-14 LAB — MAGNESIUM: Magnesium: 2.5 mg/dL — ABNORMAL HIGH (ref 1.7–2.4)

## 2020-06-14 LAB — RESP PANEL BY RT-PCR (FLU A&B, COVID) ARPGX2
Influenza A by PCR: NEGATIVE
Influenza B by PCR: NEGATIVE
SARS Coronavirus 2 by RT PCR: NEGATIVE

## 2020-06-14 MED ORDER — FINASTERIDE 5 MG PO TABS
5.0000 mg | ORAL_TABLET | Freq: Every day | ORAL | Status: DC
  Administered 2020-06-15 – 2020-06-27 (×13): 5 mg via ORAL
  Filled 2020-06-14 (×13): qty 1

## 2020-06-14 MED ORDER — DILTIAZEM HCL-DEXTROSE 125-5 MG/125ML-% IV SOLN (PREMIX)
5.0000 mg/h | INTRAVENOUS | Status: DC
  Administered 2020-06-14: 2.5 mg/h via INTRAVENOUS
  Administered 2020-06-15: 10 mg/h via INTRAVENOUS
  Filled 2020-06-14 (×2): qty 125

## 2020-06-14 MED ORDER — ONDANSETRON HCL 4 MG/2ML IJ SOLN: 4.0000 mg | Freq: Three times a day (TID) | INTRAMUSCULAR | Status: DC | PRN

## 2020-06-14 MED ORDER — ASPIRIN EC 81 MG PO TBEC
81.0000 mg | DELAYED_RELEASE_TABLET | Freq: Every day | ORAL | Status: DC
  Administered 2020-06-15: 81 mg via ORAL
  Filled 2020-06-14: qty 1

## 2020-06-14 MED ORDER — FERROUS SULFATE 325 (65 FE) MG PO TABS
325.0000 mg | ORAL_TABLET | Freq: Every day | ORAL | Status: DC
  Administered 2020-06-15 – 2020-06-19 (×5): 325 mg via ORAL
  Filled 2020-06-14 (×5): qty 1

## 2020-06-14 MED ORDER — ONE-A-DAY MENS PO TABS
1.0000 | ORAL_TABLET | Freq: Every day | ORAL | Status: DC
  Filled 2020-06-14 (×2): qty 1

## 2020-06-14 MED ORDER — DILTIAZEM HCL 25 MG/5ML IV SOLN
15.0000 mg | Freq: Once | INTRAVENOUS | Status: AC
  Administered 2020-06-14: 15 mg via INTRAVENOUS
  Filled 2020-06-14: qty 5

## 2020-06-14 MED ORDER — POLYETHYLENE GLYCOL 3350 17 GM/SCOOP PO POWD
17.0000 g | Freq: Every day | ORAL | Status: DC
  Filled 2020-06-14: qty 255

## 2020-06-14 MED ORDER — ENOXAPARIN SODIUM 30 MG/0.3ML IJ SOSY
30.0000 mg | PREFILLED_SYRINGE | INTRAMUSCULAR | Status: DC
  Filled 2020-06-14: qty 0.3

## 2020-06-14 MED ORDER — VITAMIN B-12 1000 MCG PO TABS
1000.0000 ug | ORAL_TABLET | Freq: Every day | ORAL | Status: DC
  Administered 2020-06-15 – 2020-06-19 (×5): 1000 ug via ORAL
  Filled 2020-06-14 (×5): qty 1

## 2020-06-14 MED ORDER — ENOXAPARIN SODIUM 60 MG/0.6ML IJ SOSY
0.5000 mg/kg | PREFILLED_SYRINGE | INTRAMUSCULAR | Status: DC
  Administered 2020-06-14: 57.5 mg via SUBCUTANEOUS
  Filled 2020-06-14 (×2): qty 0.57

## 2020-06-14 MED ORDER — HYDROCHLOROTHIAZIDE 25 MG PO TABS
12.5000 mg | ORAL_TABLET | Freq: Every day | ORAL | Status: DC
  Administered 2020-06-15: 12.5 mg via ORAL
  Filled 2020-06-14: qty 1

## 2020-06-14 MED ORDER — AMLODIPINE BESYLATE 10 MG PO TABS
10.0000 mg | ORAL_TABLET | Freq: Every day | ORAL | Status: DC
  Administered 2020-06-15: 10 mg via ORAL
  Filled 2020-06-14: qty 1

## 2020-06-14 MED ORDER — POLYETHYLENE GLYCOL 3350 17 G PO PACK
17.0000 g | PACK | Freq: Every day | ORAL | Status: DC
  Administered 2020-06-15 – 2020-06-18 (×3): 17 g via ORAL
  Filled 2020-06-14 (×3): qty 1

## 2020-06-14 MED ORDER — HYDRALAZINE HCL 20 MG/ML IJ SOLN: 5.0000 mg | INTRAMUSCULAR | Status: DC | PRN

## 2020-06-14 MED ORDER — SERTRALINE HCL 50 MG PO TABS
25.0000 mg | ORAL_TABLET | Freq: Every day | ORAL | Status: DC
  Administered 2020-06-14 – 2020-06-26 (×13): 25 mg via ORAL
  Filled 2020-06-14 (×13): qty 1

## 2020-06-14 MED ORDER — VITAMIN D3 25 MCG (1000 UNIT) PO TABS
2000.0000 [IU] | ORAL_TABLET | Freq: Every day | ORAL | Status: DC
  Administered 2020-06-15 – 2020-06-19 (×5): 2000 [IU] via ORAL
  Filled 2020-06-14 (×10): qty 2

## 2020-06-14 MED ORDER — ACETAMINOPHEN 325 MG PO TABS: 650.0000 mg | ORAL_TABLET | Freq: Four times a day (QID) | ORAL | Status: DC | PRN

## 2020-06-14 MED ORDER — CLOPIDOGREL BISULFATE 75 MG PO TABS
75.0000 mg | ORAL_TABLET | Freq: Every day | ORAL | Status: DC
  Administered 2020-06-15: 75 mg via ORAL
  Filled 2020-06-14: qty 1

## 2020-06-14 NOTE — Progress Notes (Signed)
PHARMACIST - PHYSICIAN COMMUNICATION  CONCERNING:  Enoxaparin (Lovenox) for DVT Prophylaxis    RECOMMENDATION: Patient was prescribed enoxaparin 40mg  q24 hours for VTE prophylaxis.   Filed Weights   06/14/20 1202  Weight: 113.4 kg (250 lb)    Body mass index is 31.25 kg/m.  Estimated Creatinine Clearance: 38.2 mL/min (A) (by C-G formula based on SCr of 2.41 mg/dL (H)).   Based on [place] patient is candidate for enoxaparin 0.5mg /kg TBW SQ every 24 hours based on BMI being >30.  DESCRIPTION: Pharmacy has adjusted enoxaparin dose per [facility] policy.  Patient is now receiving enoxaparin 57.5 mg every 24 hours    Benita Gutter 06/14/2020 7:41 PM

## 2020-06-14 NOTE — ED Notes (Signed)
Spoke with Dr Corky Downs. Pt will be moved to room 5 for closer monitoring.

## 2020-06-14 NOTE — ED Notes (Signed)
Pt able to answer some questions appropriately. Pt states he does not use a cane or walker, but placed on high fall risk due to lack of ability to verify at this time.

## 2020-06-14 NOTE — ED Triage Notes (Signed)
Per records, pt from [facility], pt brought by [facility] EMS for tachycardia. Pt unable to verbalize why he is here. Pt can state name, unsure of date or time. Pt disoriented to place. Pt alert, NAD at this time. Pt in hospital gown, shirt. Pt unable to answer most triage questions.

## 2020-06-14 NOTE — ED Provider Notes (Signed)
[facility] Emergency Department Provider Note   ____________________________________________   Event Date/Time   First MD Initiated Contact with Patient 06/14/20 1316     (approximate)  I have reviewed the triage vital signs and the nursing notes.   HISTORY  Chief Complaint Tachycardia    HPI Christopher Delacruz is a 70 y.o. male with the below stated past medical history and significant for significant dementia who [facility] for tachycardia.  Patient is unable to verbalize why he is here and is only oriented to self.  Patient should and cannot provide any further history or review of systems at this time given mental status         Past Medical History:  Diagnosis Date  . Acute renal failure superimposed on stage 3a chronic kidney disease ([place]) 09/07/2019  . B12 deficiency 09/17/2019  . Cancer [facility])    rectal cancer   . CVA (cerebral vascular accident) ([place])   . Diabetes mellitus ([place])    type 2 - controlled by diet on no meds   . Heart murmur   . Hyperlipidemia   . Hypertension   . 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 CKD (chronic kidney disease), stage IV ([place]) 06/14/2020  . Type II diabetes mellitus with renal manifestations ([place]) 06/14/2020  . Depression 06/14/2020  . Tachycardia 06/14/2020  . Atrial fibrillation with RVR ([place]) 06/14/2020  . Anxiety disorder 05/27/2020  . Diabetic renal disease ([place]) 05/27/2020  . Hypothyroidism 05/27/2020  . Late effects of cerebrovascular disease 05/27/2020  . Vitamin D deficiency 05/27/2020  . Other vitamin B12 deficiency anemias 05/27/2020  . Expressive aphasia 05/27/2020  . Aortic atherosclerosis ([place]) 05/03/2020  . Port-A-Cath in place 03/19/2020  . Anemia in stage 3b chronic kidney disease ([place]) 03/19/2020  . Pre-op evaluation 01/30/2020  . Aortic valve regurgitation 01/30/2020  .  Hypertension associated with diabetes ([place]) 01/30/2020  . Lower extremity edema 01/30/2020  . CKD (chronic kidney disease) stage 3, GFR 30-59 ml/min (HCC) 01/03/2020  . History of CVA (cerebrovascular accident) 01/03/2020  . Cognitive impairment 09/27/2019  . Encounter for antineoplastic chemotherapy 09/27/2019  . Goals of care, counseling/discussion 09/22/2019  . Swelling of upper arm 09/22/2019  . B12 deficiency 09/17/2019  . Palliative care by specialist   . DNR (do not resuscitate) discussion   . Rectal cancer s/p robotic LAR resectrion 05/25/2020   . Helicobacter pylori gastritis   . Benign prostatic hyperplasia without lower urinary tract symptoms 09/07/2019  . Absolute anemia 09/07/2019  . Iron deficiency anemia due to chronic blood loss 09/07/2019  . Hyperlipidemia 03/21/2018  . Weakness generalized 06/26/2015  . Dysphagia 06/26/2015  . Essential hypertension, malignant 06/26/2015  . Hypokalemia 06/26/2015  . Cerebral infarction ([place]) 06/23/2015  . Diabetes ([place]) 06/23/2015  . HTN (hypertension) 06/23/2015    Past Surgical History:  Procedure Laterality Date  . COLONOSCOPY N/A 09/11/2019   Procedure: COLONOSCOPY;  Surgeon: [name], MD;  Location[facility] ENDOSCOPY;  Service: Endoscopy;  Laterality: N/A;  . COLONOSCOPY WITH PROPOFOL N/A 09/12/2019   Procedure: COLONOSCOPY WITH PROPOFOL;  Surgeon: [name], MD;  Location: [facility] ENDOSCOPY;  Service: Gastroenterology;  Laterality: N/A;  . COLONOSCOPY WITH PROPOFOL N/A 09/13/2019   Procedure: COLONOSCOPY WITH PROPOFOL;  Surgeon: [name], MD;  Location: [facility] ENDOSCOPY;  Service: Gastroenterology;  Laterality: N/A;  . CYSTOSCOPY N/A 05/25/2020   Procedure:  CYSTOSCOPY WITH FIREFLY INJECTIONS AND BILATERAL OPEN ENDED CATHETER PLACEMENT;  Surgeon: Alexis Frock, MD;  [facility];  Service: Urology;  Laterality: N/A;  . ESOPHAGOGASTRODUODENOSCOPY N/A 09/11/2019   Procedure: ESOPHAGOGASTRODUODENOSCOPY (EGD);  Surgeon: [name], MD;  Location: [facility] ENDOSCOPY;  Service: Endoscopy;  Laterality: N/A;  . EXPLORE EYE SOCKET    . PORTACATH PLACEMENT N/A 09/15/2019   Procedure: INSERTION PORT-A-CATH;  Surgeon: Jules Husbands, MD;  Location: [facility] ORS;  Service: General;  Laterality: N/A;  . PROCTOSCOPY N/A 05/25/2020   Procedure: RIGID PROCTOSCOPY;  Surgeon: [name], MD;  [facility];  Service: General;  Laterality: N/A;  . XI ROBOTIC ASSISTED LOWER ANTERIOR RESECTION N/A 05/25/2020   Procedure: XI ROBOTIC ASSISTED LOWER ANTERIOR RECTOSIGMOID RESECTION WITH MOBILIZATION OF THE SPENIC FLEXURE, BILATERAL TAP BLOCK AND INTRAOPERATIVE ASSESMENT OF PERFUSION;  Surgeon: [name], MD;  Location[facility];  Service: General;  Laterality: N/A;    Prior to Admission medications   Medication Sig Start Date End Date Taking? Authorizing Provider  amLODipine (NORVASC) 10 MG tablet Take 1 tablet (10 mg total) by mouth daily. 03/23/18   Demetrios Loll, MD  aspirin EC 81 MG EC tablet Take 1 tablet (81 mg total) by mouth daily. Swallow whole. 09/17/19   ShahmehdiValeria Batman, MD  Cholecalciferol (VITAMIN D3) 50 MCG (2000 UT) capsule Take 2,000 Units by mouth daily. 04/24/20   [provider]  clopidogrel (PLAVIX) 75 MG tablet Take 75 mg by mouth daily.    [provider]  ferrous sulfate 325 (65 FE) MG tablet Take 1 tablet (325 mg total) by mouth daily. 09/16/19 09/15/20  Deatra James, MD  finasteride (PROSCAR) 5 MG tablet Take 5 mg by mouth daily.    [provider]  hydrochlorothiazide (HYDRODIURIL) 12.5 MG tablet Take 12.5 mg by mouth daily. 04/23/20   [provider]  multivitamin (ONE-A-DAY MEN'S) TABS tablet Take 1 tablet by mouth daily.    [provider]  ondansetron (ZOFRAN) 8 MG tablet Take 1 tablet (8 mg total) by mouth 2 (two) times daily as needed for refractory nausea / vomiting. Start on day 3 after chemotherapy. Patient taking differently: Take 2 mg by mouth 2 (two) times  daily as needed for nausea (Start on day 3 after chemotherapy.). 09/27/19   [name], MD  prochlorperazine (COMPAZINE) 10 MG tablet Take 10 mg by mouth every 6 (six) hours as needed for nausea or vomiting.    [provider]  sertraline (ZOLOFT) 25 MG tablet Take 25 mg by mouth at bedtime. 04/23/20   [provider]  traMADol (ULTRAM) 50 MG tablet Take 1-2 tablets (50-100 mg total) by mouth every 6 (six) hours as needed for moderate pain. 05/29/20   [name], MD  vitamin B-12 (CYANOCOBALAMIN) 1000 MCG tablet Take 1,000 mcg by mouth daily.    [provider]    Allergies Patient has no known allergies.  Family History  Problem Relation Age of Onset  . Heart attack Sister   . Leukemia Brother   . Stroke Brother     Social History Social History   Tobacco Use  . Smoking status: Never Smoker  . Smokeless tobacco: Never Used  Vaping Use  . Vaping Use: Never used  Substance Use Topics  . Alcohol use: Never    Alcohol/week: 0.0 standard drinks  . Drug use: Never    Review of Systems Unable to assess ____________________________________________   PHYSICAL EXAM:  VITAL SIGNS: ED Triage  Vitals  Enc Vitals Group     BP 06/14/20 1159 119/84     Pulse Rate 06/14/20 1159 (!) 140     Resp 06/14/20 1159 20     Temp 06/14/20 1159 98.4 F (36.9 C)     Temp Source 06/14/20 1159 Oral     SpO2 06/14/20 1159 98 %     Weight 06/14/20 1202 250 lb (113.4 kg)     Height 06/14/20 1202 6\' 3"  (1.905 m)     Head Circumference --      [facility] --      Pain Score 06/14/20 1535 0     Pain Loc --      Pain Edu? --      Excl. in [place]? --    Constitutional: Alert and disoriented. Well appearing and in no acute distress. Eyes: Conjunctivae are normal. PERRL. Head: Atraumatic. Nose: No congestion/rhinnorhea. Mouth/Throat: Mucous membranes are moist. Neck: No stridor Cardiovascular: Grossly normal heart sounds.  Good peripheral circulation. Respiratory: Normal  respiratory effort.  No retractions. Gastrointestinal: Soft and nontender. No distention. Musculoskeletal: No obvious deformities Neurologic: Slurred speech and nonsense language.  Moves all extremities spontaneously Skin:  Skin is warm and dry. No rash noted. Psychiatric: Cooperative  ____________________________________________   LABS (all labs ordered are listed, but only abnormal results are displayed)  Labs Reviewed  BASIC METABOLIC PANEL - Abnormal; Notable for the following components:      Result Value   Glucose, Bld 174 (*)    BUN 42 (*)    Creatinine, Ser 2.41 (*)    GFR, Estimated 28 (*)    All other components within normal limits  CBC - Abnormal; Notable for the following components:   RBC 3.54 (*)    Hemoglobin 9.7 (*)    HCT 31.2 (*)    Platelets 591 (*)    All other components within normal limits  MAGNESIUM - Abnormal; Notable for the following components:   Magnesium 2.5 (*)    All other components within normal limits  TROPONIN I (HIGH SENSITIVITY) - Abnormal; Notable for the following components:   Troponin I (High Sensitivity) 19 (*)    All other components within normal limits  RESP PANEL BY RT-PCR (FLU A&B, COVID) ARPGX2  PROTIME-INR  URINE DRUG SCREEN, QUALITATIVE ([facility] ONLY)   ____________________________________________  EKG  ED ECG REPORT I, Naaman Plummer, the attending physician, personally viewed and interpreted this ECG.  Date: 06/14/2020 EKG Time: 1206 Rate: 142 Rhythm: Atrial fibrillation with rapid ventricular response QRS Axis: normal Intervals: normal ST/T Wave abnormalities: normal Narrative Interpretation: no evidence of acute ischemia  ____________________________________________  RADIOLOGY  ED MD interpretation: One-view portable chest x-ray shows no evidence of acute abnormalities including no pneumonia, pneumothorax, or widened mediastinum  Official radiology report(s): DG Chest Port 1 View  Result Date:  06/14/2020 CLINICAL DATA:  Tachycardia. EXAM: PORTABLE CHEST 1 VIEW COMPARISON:  09/14/2013 FINDINGS: The right IJ power port is stable. The heart is within normal limits in size given the AP projection and portable technique. There is moderate tortuosity and mild calcification of the thoracic aorta. No acute pulmonary findings. No pleural effusion or pulmonary lesions. The bony thorax is intact. IMPRESSION: No acute cardiopulmonary findings. Electronically Signed   By: Marijo Sanes M.D.   On: 06/14/2020 13:28    ____________________________________________   PROCEDURES  Procedure(s) performed (including Critical Care):  .1-3 Lead EKG Interpretation Performed by: Naaman Plummer, MD Authorized by: Naaman Plummer, MD  Interpretation: abnormal     ECG rate:  95   ECG rate assessment: tachycardic     Rhythm: atrial fibrillation     Ectopy: none     Conduction: normal       ____________________________________________   INITIAL IMPRESSION / ASSESSMENT AND PLAN / ED COURSE  As part of my medical decision making, I reviewed the following data within the [place] notes reviewed and incorporated, Labs reviewed, EKG interpreted, Old chart reviewed, Radiograph reviewed and Notes from prior ED visits reviewed and incorporated        + atrial fibrillation w/ RVR DDx: Pneumothorax, Pneumonia, Pulmonary Embolus, Tamponade, ACS, Thyrotoxicosis.  No history or evidence decompensated heart failure. Given their history and exam it is likely this patient is unlikely to spontaneously revert to a rate controlled rhythm and necessitates a thorough workup for their arrhythmia. Workup: ECG, CXR, CBC, BMP, UA, Troponin, BNP, TSH, Ca-Mag-Phos Interventions: Defer Cardioversion (uncertain historical reliability with time of onset, increased risk of thromboembolic stroke).  Start diltiazem bolus and drip  Reassessment: Patient maintained NSR during multi-hour observation  in ED. Disposition: Discharge home with prompt PCP follow up and cardiology referral.   Disposition: Admit      ____________________________________________   FINAL CLINICAL IMPRESSION(S) / ED DIAGNOSES  Final diagnoses:  None     ED Discharge Orders    None       Note:  This document was prepared using Dragon voice recognition software and may include unintentional dictation errors.   Naaman Plummer, MD 06/14/20 [phone]

## 2020-06-14 NOTE — ED Notes (Signed)
Cardiologist at bedside.  

## 2020-06-14 NOTE — ED Notes (Addendum)
Pts nephew, Christopher Delacruz [facility]) at bedside. Per POA pt had rectal cancer surgery x couple months ago and pts mentation had diminished since then. Pt is currently at baseline mentation per nephew. Pt is alert and oriented to self only. Pts Nephew would like to be notified when pt is moved to IP bed. [phone].

## 2020-06-14 NOTE — Progress Notes (Signed)
Cardiology  Ask to consult on patient for AFIB-RVR Patient and family informed my that they follow with [facility] I have notified the Hospitalist to redirect the consult. Thanks, [employer]

## 2020-06-14 NOTE — H&P (Addendum)
History and Physical    Christopher Delacruz EXN:[id number] DOB: 1950-12-18 DOA: 06/14/2020  Referring MD/NP/PA:   PCP: [name], MD   Patient coming from:  The patient is coming from SNF.  At baseline, pt is dependent for most of ADL.        Chief Complaint: Tachycardia  HPI: Christopher Delacruz is a 70 y.o. male with medical history significant of HTN, HLD, DM, stroke with right-sided weakness and aphasia, CKD-IV, anemia, BPH, AVR, rectal CA (s/p of robotic LAR resection 05/25/20), hypothyroidism, depression with anxiety, cognitive impairment, dementia, who presents with tachycardia.  Patient has history of dementia and aphasia secondary to previous stroke, and appears to be very poor historian and is unable to provide accurate medical history, therefore, most of the history is obtained by discussing the case with ED physician, per EMS report, and with the nursing staff.  Per report, pt was found to have tachycardia with in SNF. Per EDP, pt was found to new onset A fib with RVR with HR up to 140s in ED. Pt was given 15 mg of Cardizem IV and started Cardizem drip.  Patient converted to sinus rhythm. Patient denies chest pain, cough, shortness of breath.  No fever or chills.  Patient does not have nausea vomiting, diarrhea or abdominal pain.  Denies symptoms of UTI.  ED Course: pt was found to have WBC 9.5, INR 1.2, trop 19, negative COVID PCR, renal function close to recent baseline, temperature normal, blood pressure 152/82, RR 29, oxygen saturation 96% on room air.  Chest x-ray negative.  Patient is placed on progressive bed for observation, Dr. Clayborn Bigness of cardiology is consulted  Review of Systems: Could not be reviewed accurately due to dementia and aphasia.  Allergy: No Known Allergies  Past Medical History:  Diagnosis Date  . Acute renal failure superimposed on stage 3a chronic kidney disease ([place]) 09/07/2019  . B12 deficiency 09/17/2019  . Cancer [facility])    rectal cancer   . CVA (cerebral  vascular accident) ([place])   . Diabetes mellitus ([place])    type 2 - controlled by diet on no meds   . Heart murmur   . Hyperlipidemia   . Hypertension   . Right hemiparesis ([place]) 2017   Associated with stroke    Past Surgical History:  Procedure Laterality Date  . COLONOSCOPY N/A 09/11/2019   Procedure: COLONOSCOPY;  Surgeon: [name], MD;  Location: [facility] ENDOSCOPY;  Service: Endoscopy;  Laterality: N/A;  . COLONOSCOPY WITH PROPOFOL N/A 09/12/2019   Procedure: COLONOSCOPY WITH PROPOFOL;  Surgeon: [name], MD;  Location: [facility] ENDOSCOPY;  Service: Gastroenterology;  Laterality: N/A;  . COLONOSCOPY WITH PROPOFOL N/A 09/13/2019   Procedure: COLONOSCOPY WITH PROPOFOL;  Surgeon: [name], MD;  Location: [facility] ENDOSCOPY;  Service: Gastroenterology;  Laterality: N/A;  . CYSTOSCOPY N/A 05/25/2020   Procedure: CYSTOSCOPY WITH FIREFLY INJECTIONS AND BILATERAL OPEN ENDED CATHETER PLACEMENT;  Surgeon: Alexis Frock, MD;  Location: [facility] ORS;  Service: Urology;  Laterality: N/A;  . ESOPHAGOGASTRODUODENOSCOPY N/A 09/11/2019   Procedure: ESOPHAGOGASTRODUODENOSCOPY (EGD);  Surgeon: [name], MD;  Location: [facility] ENDOSCOPY;  Service: Endoscopy;  Laterality: N/A;  . EXPLORE EYE SOCKET    . PORTACATH PLACEMENT N/A 09/15/2019   Procedure: INSERTION PORT-A-CATH;  Surgeon: Jules Husbands, MD;  Location: [facility] ORS;  Service: General;  Laterality: N/A;  . PROCTOSCOPY N/A 05/25/2020   Procedure: RIGID PROCTOSCOPY;  Surgeon: [name], MD;  Location: [facility] ORS;  Service: General;  Laterality: N/A;  . XI ROBOTIC  ASSISTED LOWER ANTERIOR RESECTION N/A 05/25/2020   Procedure: XI ROBOTIC ASSISTED LOWER ANTERIOR RECTOSIGMOID RESECTION WITH MOBILIZATION OF THE SPENIC FLEXURE, BILATERAL TAP BLOCK AND INTRAOPERATIVE ASSESMENT OF PERFUSION;  Surgeon: [name], MD;  Location: [facility] ORS;  Service: General;  Laterality: N/A;    Social History:  reports that he has never smoked. He has never used smokeless tobacco. He  reports that he does not drink alcohol and does not use drugs.  Family History:  Family History  Problem Relation Age of Onset  . Heart attack Sister   . Leukemia Brother   . Stroke Brother      Prior to Admission medications   Medication Sig Start Date End Date Taking? Authorizing Provider  amLODipine (NORVASC) 10 MG tablet Take 1 tablet (10 mg total) by mouth daily. 03/23/18   Demetrios Loll, MD  aspirin EC 81 MG EC tablet Take 1 tablet (81 mg total) by mouth daily. Swallow whole. 09/17/19   ShahmehdiValeria Batman, MD  Cholecalciferol (VITAMIN D3) 50 MCG (2000 UT) capsule Take 2,000 Units by mouth daily. 04/24/20   [provider]  clopidogrel (PLAVIX) 75 MG tablet Take 75 mg by mouth daily.    [provider]  ferrous sulfate 325 (65 FE) MG tablet Take 1 tablet (325 mg total) by mouth daily. 09/16/19 09/15/20  Deatra James, MD  finasteride (PROSCAR) 5 MG tablet Take 5 mg by mouth daily.    [provider]  hydrochlorothiazide (HYDRODIURIL) 12.5 MG tablet Take 12.5 mg by mouth daily. 04/23/20   [provider]  multivitamin (ONE-A-DAY MEN'S) TABS tablet Take 1 tablet by mouth daily.    [provider]  ondansetron (ZOFRAN) 8 MG tablet Take 1 tablet (8 mg total) by mouth 2 (two) times daily as needed for refractory nausea / vomiting. Start on day 3 after chemotherapy. Patient taking differently: Take 2 mg by mouth 2 (two) times daily as needed for nausea (Start on day 3 after chemotherapy.). 09/27/19   [name], MD  prochlorperazine (COMPAZINE) 10 MG tablet Take 10 mg by mouth every 6 (six) hours as needed for nausea or vomiting.    [provider]  sertraline (ZOLOFT) 25 MG tablet Take 25 mg by mouth at bedtime. 04/23/20   [provider]  traMADol (ULTRAM) 50 MG tablet Take 1-2 tablets (50-100 mg total) by mouth every 6 (six) hours as needed for moderate pain. 05/29/20   [name], MD  vitamin B-12 (CYANOCOBALAMIN) 1000 MCG tablet  Take 1,000 mcg by mouth daily.    [provider]    Physical Exam: Vitals:   06/14/20 1600 06/14/20 1615 06/14/20 1630 06/14/20 1700  BP: 105/67  (!) 114/97 (!) 138/92  Pulse: 71 74  100  Resp: 20 (!) 29 (!) 27 (!) 25  Temp:      TempSrc:      SpO2: 96% 99% 97% 97%  Weight:      Height:       General: Not in acute distress HEENT:       Eyes: PERRL, EOMI, no scleral icterus.       ENT: No discharge from the ears and nose, no pharynx injection, no tonsillar enlargement.        Neck: No JVD, no bruit, no mass felt. Heme: No neck lymph node enlargement. Cardiac: S1/S2, regular rhythm currently, no gallops or rubs. Respiratory: No rales, wheezing, rhonchi or rubs. GI: Soft, nondistended, nontender, no rebound pain, no organomegaly, BS present. GU: No  hematuria Ext: No pitting leg edema bilaterally. 1+DP/PT pulse bilaterally. Musculoskeletal: No joint deformities, No joint redness or warmth, no limitation of ROM in spin. Skin: No rashes.  Neuro: Alert, following command cranial nerves II-XII grossly intact, has right-sided weakness Psych: Patient is not psychotic, no suicidal or hemocidal ideation.  Labs on Admission: I have personally reviewed following labs and imaging studies  CBC: Recent Labs  Lab 06/14/20 1220  WBC 9.5  HGB 9.7*  HCT 31.2*  MCV 88.1  PLT 387*   Basic Metabolic Panel: Recent Labs  Lab 06/14/20 1220  NA 138  K 4.0  CL 102  CO2 24  GLUCOSE 174*  BUN 42*  CREATININE 2.41*  CALCIUM 9.3  MG 2.5*   GFR: Estimated Creatinine Clearance: 38.2 mL/min (A) (by C-G formula based on SCr of 2.41 mg/dL (H)). Liver Function Tests: No results for input(s): AST, ALT, ALKPHOS, BILITOT, PROT, ALBUMIN in the last 168 hours. No results for input(s): LIPASE, AMYLASE in the last 168 hours. No results for input(s): AMMONIA in the last 168 hours. Coagulation Profile: Recent Labs  Lab 06/14/20 1220  INR 1.2   Cardiac Enzymes: No results for input(s):  CKTOTAL, CKMB, CKMBINDEX, TROPONINI in the last 168 hours. BNP (last 3 results) No results for input(s): PROBNP in the last 8760 hours. HbA1C: No results for input(s): HGBA1C in the last 72 hours. CBG: No results for input(s): GLUCAP in the last 168 hours. Lipid Profile: No results for input(s): CHOL, HDL, LDLCALC, TRIG, CHOLHDL, LDLDIRECT in the last 72 hours. Thyroid Function Tests: No results for input(s): TSH, T4TOTAL, FREET4, T3FREE, THYROIDAB in the last 72 hours. Anemia Panel: No results for input(s): VITAMINB12, FOLATE, FERRITIN, TIBC, IRON, RETICCTPCT in the last 72 hours. Urine analysis:    Component Value Date/Time   COLORURINE YELLOW (A) 06/30/2015 0810   APPEARANCEUR Clear 03/25/2019 1354   LABSPEC 1.023 06/30/2015 0810   PHURINE 5.0 06/30/2015 0810   GLUCOSEU Negative 03/25/2019 1354   HGBUR NEGATIVE 06/30/2015 0810   BILIRUBINUR Negative 03/25/2019 1354   KETONESUR NEGATIVE 06/30/2015 0810   PROTEINUR 3+ (A) 03/25/2019 1354   PROTEINUR >500 (A) 06/30/2015 0810   NITRITE Negative 03/25/2019 1354   NITRITE NEGATIVE 06/30/2015 0810   LEUKOCYTESUR Negative 03/25/2019 1354   Sepsis Labs: @LABRCNTIP (procalcitonin:4,lacticidven:4) ) Recent Results (from the past 240 hour(s))  Resp Panel by RT-PCR (Flu A&B, Covid) Nasopharyngeal Swab     Status: None   Collection Time: 06/14/20  2:40 PM   Specimen: Nasopharyngeal Swab; Nasopharyngeal(NP) swabs in vial transport medium  Result Value Ref Range Status   SARS Coronavirus 2 by RT PCR NEGATIVE NEGATIVE Final    Comment: (NOTE) SARS-CoV-2 target nucleic acids are NOT DETECTED.  The SARS-CoV-2 RNA is generally detectable in upper respiratory specimens during the acute phase of infection. The lowest concentration of SARS-CoV-2 viral copies this assay can detect is 138 copies/mL. A negative result does not preclude SARS-Cov-2 infection and should not be used as the sole basis for treatment or other patient management  decisions. A negative result may occur with  improper specimen collection/handling, submission of specimen other than nasopharyngeal swab, presence of viral mutation(s) within the areas targeted by this assay, and inadequate number of viral copies(<138 copies/mL). A negative result must be combined with clinical observations, patient history, and epidemiological information. The expected result is Negative.  Fact Sheet for Patients:  EntrepreneurPulse.com.au  Fact Sheet for Healthcare Providers:  IncredibleEmployment.be  This test is no t yet approved or cleared  by the [place] and  has been authorized for detection and/or diagnosis of SARS-CoV-2 by FDA under an Emergency Use Authorization (EUA). This EUA will remain  in effect (meaning this test can be used) for the duration of the COVID-19 declaration under Section 564(b)(1) of the Act, 21 U.S.C.section 360bbb-3(b)(1), unless the authorization is terminated  or revoked sooner.       Influenza A by PCR NEGATIVE NEGATIVE Final   Influenza B by PCR NEGATIVE NEGATIVE Final    Comment: (NOTE) The Xpert Xpress SARS-CoV-2/FLU/RSV plus assay is intended as an aid in the diagnosis of influenza from Nasopharyngeal swab specimens and should not be used as a sole basis for treatment. Nasal washings and aspirates are unacceptable for Xpert Xpress SARS-CoV-2/FLU/RSV testing.  Fact Sheet for Patients: EntrepreneurPulse.com.au  Fact Sheet for Healthcare Providers: IncredibleEmployment.be  This test is not yet approved or cleared by the [place] FDA and has been authorized for detection and/or diagnosis of SARS-CoV-2 by FDA under an Emergency Use Authorization (EUA). This EUA will remain in effect (meaning this test can be used) for the duration of the COVID-19 declaration under Section 564(b)(1) of the Act, 21 U.S.C. section 360bbb-3(b)(1), unless the  authorization is terminated or revoked.  Performed at [facility], [address] [place], [place] [address]      Radiological Exams on Admission: DG Chest [facility] 1 View  Result Date: 06/14/2020 CLINICAL DATA:  Tachycardia. EXAM: PORTABLE CHEST 1 VIEW COMPARISON:  09/14/2013 FINDINGS: The right IJ power port is stable. The heart is within normal limits in size given the AP projection and portable technique. There is moderate tortuosity and mild calcification of the thoracic aorta. No acute pulmonary findings. No pleural effusion or pulmonary lesions. The bony thorax is intact. IMPRESSION: No acute cardiopulmonary findings. Electronically Signed   By: Marijo Sanes M.D.   On: 06/14/2020 13:28     EKG: I have personally reviewed.  Seems to be in flutter, tachycardia, QTC 486, heart rate of 142, bifascicular block, early R wave progression  Assessment/Plan Principal Problem:   Atrial fibrillation with RVR (HCC) Active Problems:   HTN (hypertension)   Benign prostatic hyperplasia without lower urinary tract symptoms   Iron deficiency anemia due to chronic blood loss   Rectal cancer s/p robotic LAR resectrion 05/25/2020   History of CVA (cerebrovascular accident)   Hypothyroidism   CKD (chronic kidney disease), stage IV (HCC)   Type II diabetes mellitus with renal manifestations ([place])   Depression   Elevated troponin   Atrial fibrillation/Aflutter with RVR and elevated troponin: Patient is converted to sinus rhythm with IV Cardizem, currently heart rate 70s.  Troponin 19.  Denies chest pain.  Possibly has demand ischemia.   Patient is not a good candidate for anticoagulant use since patient has history of rectal cancer and anemia due to chronic blood loss. Dr. Harrell Gave of cardiology is consulted.   -Placed on progressive bed for position -As needed Cardizem -Check magnesium level --> 2.5 -Trend troponin -Check A1c, FLP, TSH -Patient is on aspirin  HTN  (hypertension) -IV hydralazine as needed -Amlodipine and HCTZ  Benign prostatic hyperplasia without lower urinary tract symptoms -Proscar  Iron deficiency anemia due to chronic blood loss: Hemoglobin stable 9.7 (8.9 on 05/27/2020 -Continue iron supplement  Rectal cancer s/p robotic LAR resectrion 05/25/2020 -Follow-up with oncology  History of CVA (cerebrovascular accident) -On aspirin and Plavix  Hypothyroidism: Patient's not taking medications currently -Follow-up TSH level  CKD (chronic kidney disease),  stage IV [facility]): Patient had creatinine 1.7 in February, but recently baseline creatinine is 2.13-2.78.  Today his creatinine is at 2.41, BUN 42, close to recent baseline. -Follow-up of by BMP  Diet controlled Type II diabetes mellitus with renal manifestations [facility]): Recent A1c 5.1, well controlled.  Patient is not taking medications currently.  Blood sugar 174 -check CBG qAM  Depression -Zoloft          DVT ppx: SQ Lovenox Code Status: Partial code per his nephew (okay for CPR, no intubation) Family Communication:  Yes, patient's nephew by phone Disposition Plan:  Anticipate discharge back to previous environment Consults called:  Dr. Harrell Gave of card Admission status and Level of care: Progressive Cardiac:  for obs     Date of Service 06/14/2020    [place] [facility]   If 7PM-7AM, please contact night-coverage [email or web address] 06/14/2020, 5:24 PM

## 2020-06-15 ENCOUNTER — Inpatient Hospital Stay
Admit: 2020-06-15 | Discharge: 2020-06-15 | Disposition: A | Discharge status: Home / Self Care | Payer: Medicare Other | Attending: Internal Medicine | Admitting: Internal Medicine

## 2020-06-15 DIAGNOSIS — I959 Hypotension, unspecified: Secondary | ICD-10-CM | POA: Diagnosis present

## 2020-06-15 DIAGNOSIS — Z20822 Contact with and (suspected) exposure to covid-19: Secondary | ICD-10-CM | POA: Diagnosis present

## 2020-06-15 DIAGNOSIS — Z7189 Other specified counseling: Secondary | ICD-10-CM | POA: Diagnosis not present

## 2020-06-15 DIAGNOSIS — N184 Chronic kidney disease, stage 4 (severe): Secondary | ICD-10-CM | POA: Diagnosis present

## 2020-06-15 DIAGNOSIS — F419 Anxiety disorder, unspecified: Secondary | ICD-10-CM | POA: Diagnosis present

## 2020-06-15 DIAGNOSIS — E785 Hyperlipidemia, unspecified: Secondary | ICD-10-CM | POA: Diagnosis present

## 2020-06-15 DIAGNOSIS — D509 Iron deficiency anemia, unspecified: Secondary | ICD-10-CM | POA: Diagnosis present

## 2020-06-15 DIAGNOSIS — I700 Atherosclerosis of aorta: Secondary | ICD-10-CM | POA: Diagnosis present

## 2020-06-15 DIAGNOSIS — C20 Malignant neoplasm of rectum: Secondary | ICD-10-CM | POA: Diagnosis present

## 2020-06-15 DIAGNOSIS — I69320 Aphasia following cerebral infarction: Secondary | ICD-10-CM | POA: Diagnosis not present

## 2020-06-15 DIAGNOSIS — E1122 Type 2 diabetes mellitus with diabetic chronic kidney disease: Secondary | ICD-10-CM | POA: Diagnosis present

## 2020-06-15 DIAGNOSIS — E1129 Type 2 diabetes mellitus with other diabetic kidney complication: Secondary | ICD-10-CM

## 2020-06-15 DIAGNOSIS — I483 Typical atrial flutter: Secondary | ICD-10-CM

## 2020-06-15 DIAGNOSIS — F32A Depression, unspecified: Secondary | ICD-10-CM | POA: Diagnosis present

## 2020-06-15 DIAGNOSIS — I4891 Unspecified atrial fibrillation: Secondary | ICD-10-CM | POA: Diagnosis present

## 2020-06-15 DIAGNOSIS — K922 Gastrointestinal hemorrhage, unspecified: Secondary | ICD-10-CM | POA: Diagnosis not present

## 2020-06-15 DIAGNOSIS — Z515 Encounter for palliative care: Secondary | ICD-10-CM | POA: Diagnosis not present

## 2020-06-15 DIAGNOSIS — R778 Other specified abnormalities of plasma proteins: Secondary | ICD-10-CM | POA: Diagnosis present

## 2020-06-15 DIAGNOSIS — I10 Essential (primary) hypertension: Secondary | ICD-10-CM | POA: Diagnosis not present

## 2020-06-15 DIAGNOSIS — Z8673 Personal history of transient ischemic attack (TIA), and cerebral infarction without residual deficits: Secondary | ICD-10-CM | POA: Diagnosis not present

## 2020-06-15 DIAGNOSIS — D631 Anemia in chronic kidney disease: Secondary | ICD-10-CM | POA: Diagnosis present

## 2020-06-15 DIAGNOSIS — E538 Deficiency of other specified B group vitamins: Secondary | ICD-10-CM | POA: Diagnosis present

## 2020-06-15 DIAGNOSIS — I69351 Hemiplegia and hemiparesis following cerebral infarction affecting right dominant side: Secondary | ICD-10-CM | POA: Diagnosis not present

## 2020-06-15 DIAGNOSIS — F0390 Unspecified dementia without behavioral disturbance: Secondary | ICD-10-CM | POA: Diagnosis present

## 2020-06-15 DIAGNOSIS — N400 Enlarged prostate without lower urinary tract symptoms: Secondary | ICD-10-CM | POA: Diagnosis present

## 2020-06-15 DIAGNOSIS — Z66 Do not resuscitate: Secondary | ICD-10-CM | POA: Diagnosis present

## 2020-06-15 DIAGNOSIS — E039 Hypothyroidism, unspecified: Secondary | ICD-10-CM | POA: Diagnosis present

## 2020-06-15 DIAGNOSIS — D62 Acute posthemorrhagic anemia: Secondary | ICD-10-CM | POA: Diagnosis present

## 2020-06-15 DIAGNOSIS — H9190 Unspecified hearing loss, unspecified ear: Secondary | ICD-10-CM | POA: Diagnosis present

## 2020-06-15 DIAGNOSIS — I129 Hypertensive chronic kidney disease with stage 1 through stage 4 chronic kidney disease, or unspecified chronic kidney disease: Secondary | ICD-10-CM | POA: Diagnosis present

## 2020-06-15 LAB — CBC
HCT: 28 % — ABNORMAL LOW (ref 39.0–52.0)
Hemoglobin: 8.9 g/dL — ABNORMAL LOW (ref 13.0–17.0)
MCH: 27.6 pg (ref 26.0–34.0)
MCHC: 31.8 g/dL (ref 30.0–36.0)
MCV: 87 fL (ref 80.0–100.0)
Platelets: 466 10*3/uL — ABNORMAL HIGH (ref 150–400)
RBC: 3.22 MIL/uL — ABNORMAL LOW (ref 4.22–5.81)
RDW: 13.2 % (ref 11.5–15.5)
WBC: 8.7 10*3/uL (ref 4.0–10.5)
nRBC: 0 % (ref 0.0–0.2)

## 2020-06-15 LAB — GLUCOSE, CAPILLARY: Glucose-Capillary: 157 mg/dL — ABNORMAL HIGH (ref 70–99)

## 2020-06-15 LAB — BASIC METABOLIC PANEL
Anion gap: 9 (ref 5–15)
BUN: 40 mg/dL — ABNORMAL HIGH (ref 8–23)
CO2: 23 mmol/L (ref 22–32)
Calcium: 8.7 mg/dL — ABNORMAL LOW (ref 8.9–10.3)
Chloride: 107 mmol/L (ref 98–111)
Creatinine, Ser: 2.18 mg/dL — ABNORMAL HIGH (ref 0.61–1.24)
GFR, Estimated: 32 mL/min — ABNORMAL LOW (ref >60)
Glucose, Bld: 144 mg/dL — ABNORMAL HIGH (ref 70–99)
Potassium: 3.6 mmol/L (ref 3.5–5.1)
Sodium: 139 mmol/L (ref 135–145)

## 2020-06-15 LAB — LIPID PANEL
Cholesterol: 131 mg/dL (ref 0–200)
HDL: 30 mg/dL — ABNORMAL LOW (ref >40)
LDL Cholesterol: 81 mg/dL (ref 0–99)
Total CHOL/HDL Ratio: 4.4 RATIO
Triglycerides: 100 mg/dL (ref <150)
VLDL: 20 mg/dL (ref 0–40)

## 2020-06-15 LAB — HEMOGLOBIN A1C
Hgb A1c MFr Bld: 6 % — ABNORMAL HIGH (ref 4.8–5.6)
Mean Plasma Glucose: 125.5 mg/dL

## 2020-06-15 LAB — TSH: TSH: 1.978 u[IU]/mL (ref 0.350–4.500)

## 2020-06-15 MED ORDER — ENOXAPARIN SODIUM 40 MG/0.4ML IJ SOSY: 40.0000 mg | PREFILLED_SYRINGE | INTRAMUSCULAR | Status: DC

## 2020-06-15 MED ORDER — APIXABAN 5 MG PO TABS
5.0000 mg | ORAL_TABLET | Freq: Two times a day (BID) | ORAL | Status: DC
  Administered 2020-06-15 – 2020-06-16 (×2): 5 mg via ORAL
  Filled 2020-06-15 (×2): qty 1

## 2020-06-15 MED ORDER — DILTIAZEM HCL ER COATED BEADS 180 MG PO CP24
180.0000 mg | ORAL_CAPSULE | Freq: Every day | ORAL | Status: DC
  Administered 2020-06-16 – 2020-06-27 (×12): 180 mg via ORAL
  Filled 2020-06-15 (×12): qty 1

## 2020-06-15 MED ORDER — DILTIAZEM HCL ER COATED BEADS 120 MG PO CP24
120.0000 mg | ORAL_CAPSULE | Freq: Once | ORAL | Status: AC
  Administered 2020-06-15: 120 mg via ORAL
  Filled 2020-06-15: qty 1

## 2020-06-15 MED ORDER — POTASSIUM CHLORIDE CRYS ER 20 MEQ PO TBCR
40.0000 meq | EXTENDED_RELEASE_TABLET | Freq: Once | ORAL | Status: AC
  Administered 2020-06-15: 40 meq via ORAL
  Filled 2020-06-15: qty 2

## 2020-06-15 MED ORDER — ADULT MULTIVITAMIN W/MINERALS CH
1.0000 | ORAL_TABLET | Freq: Every day | ORAL | Status: DC
  Administered 2020-06-16 – 2020-06-19 (×4): 1 via ORAL
  Filled 2020-06-15 (×7): qty 1

## 2020-06-15 MED ORDER — METOPROLOL TARTRATE 5 MG/5ML IV SOLN
5.0000 mg | INTRAVENOUS | Status: DC | PRN
  Administered 2020-06-15 – 2020-06-17 (×4): 5 mg via INTRAVENOUS
  Filled 2020-06-15 (×5): qty 5

## 2020-06-15 NOTE — NC FL2 (Signed)
[place] LEVEL OF CARE SCREENING TOOL     IDENTIFICATION  Patient Name: Christopher Delacruz Birthdate: [birth year] Sex: male Admission Date (Current Location): 06/14/2020  [facility] and [place] Number:  Engineering geologist and Address:  [facility], [address] [place], [place] [address]      Provider Number: [id number]  Attending Physician Name and Address:  Sidney Ace, MD  Relative Name and Phone Number:       Current Level of Care: Hospital Recommended Level of Care: [place] Prior Approval Number:    Date Approved/Denied:   PASRR Number:    Discharge Plan: SNF    Current Diagnoses: Patient Active Problem List   Diagnosis Date Noted  . CKD (chronic kidney disease), stage IV ([place]) 06/14/2020  . Type II diabetes mellitus with renal manifestations ([place]) 06/14/2020  . Depression 06/14/2020  . Tachycardia 06/14/2020  . Atrial fibrillation with RVR ([place]) 06/14/2020  . Elevated troponin 06/14/2020  . Anxiety disorder 05/27/2020  . Diabetic renal disease ([place]) 05/27/2020  . Hypothyroidism 05/27/2020  . Late effects of cerebrovascular disease 05/27/2020  . Vitamin D deficiency 05/27/2020  . Other vitamin B12 deficiency anemias 05/27/2020  . Expressive aphasia 05/27/2020  . Aortic atherosclerosis ([place]) 05/03/2020  . Port-A-Cath in place 03/19/2020  . Anemia in stage 3b chronic kidney disease ([place]) 03/19/2020  . Pre-op evaluation 01/30/2020  . Aortic valve regurgitation 01/30/2020  . Hypertension associated with diabetes ([place]) 01/30/2020  . Lower extremity edema 01/30/2020  . CKD (chronic kidney disease) stage 3, GFR 30-59 ml/min (HCC) 01/03/2020  . History of CVA (cerebrovascular accident) 01/03/2020  . Cognitive impairment 09/27/2019  . Encounter for antineoplastic chemotherapy 09/27/2019  . Goals of care, counseling/discussion 09/22/2019  . Swelling of upper arm 09/22/2019  . B12 deficiency 09/17/2019  .  Palliative care by specialist   . DNR (do not resuscitate) discussion   . Rectal cancer s/p robotic LAR resectrion 05/25/2020   . Helicobacter pylori gastritis   . Benign prostatic hyperplasia without lower urinary tract symptoms 09/07/2019  . Absolute anemia 09/07/2019  . Iron deficiency anemia due to chronic blood loss 09/07/2019  . Hyperlipidemia 03/21/2018  . Weakness generalized 06/26/2015  . Dysphagia 06/26/2015  . Essential hypertension, malignant 06/26/2015  . Hypokalemia 06/26/2015  . Cerebral infarction ([place]) 06/23/2015  . Diabetes ([place]) 06/23/2015  . HTN (hypertension) 06/23/2015    Orientation RESPIRATION BLADDER Height & Weight     Self  Normal Incontinent Weight: 228 lb 13.4 oz (103.8 kg) Height:  6\' 3"  (190.5 cm)  BEHAVIORAL SYMPTOMS/MOOD NEUROLOGICAL BOWEL NUTRITION STATUS      Incontinent Diet (heart healthy/carb modified)  AMBULATORY STATUS COMMUNICATION OF NEEDS Skin   Limited Assist Verbally Surgical wounds (closed incision on abdomen)                       Personal Care Assistance Level of Assistance  Bathing,Feeding,Dressing Bathing Assistance: Limited assistance Feeding assistance: Independent Dressing Assistance: Limited assistance     Functional Limitations Info  Sight,Hearing,Speech Sight Info: Adequate Hearing Info: Adequate Speech Info: Adequate    SPECIAL CARE FACTORS FREQUENCY  PT (By licensed PT),OT (By licensed OT)     PT Frequency: 5x OT Frequency: 5x            Contractures Contractures Info: Not present    Additional Factors Info  Code Status,Allergies Code Status Info: partial Allergies Info: no known allergies  Current Medications (06/15/2020):  This is the current hospital active medication list Current Facility-Administered Medications  Medication Dose Route Frequency Provider Last Rate Last Admin  . acetaminophen (TYLENOL) tablet 650 mg  650 mg Oral Q6H PRN Ivor Costa, MD      . cholecalciferol  (VITAMIN D) tablet 2,000 Units  2,000 Units Oral Daily Ivor Costa, MD   2,000 Units at 06/15/20 0902  . [START ON 06/16/2020] diltiazem (CARDIZEM CD) 24 hr capsule 180 mg  180 mg Oral Daily Gollan, Kathlene November, MD      . enoxaparin (LOVENOX) injection 40 mg  40 mg Subcutaneous Q24H Benn Moulder, RPH      . ferrous sulfate tablet 325 mg  325 mg Oral Daily Ivor Costa, MD   325 mg at 06/15/20 8295  . finasteride (PROSCAR) tablet 5 mg  5 mg Oral Daily Ivor Costa, MD   5 mg at 06/15/20 6213  . hydrALAZINE (APRESOLINE) injection 5 mg  5 mg Intravenous Q2H PRN Ivor Costa, MD      . multivitamin (ONE-A-DAY MEN'S) tablet 1 tablet  1 tablet Oral Daily Ivor Costa, MD      . ondansetron [facility]) injection 4 mg  4 mg Intravenous Q8H PRN Ivor Costa, MD      . polyethylene glycol (MIRALAX / GLYCOLAX) packet 17 g  17 g Oral Daily Benita Gutter, RPH   17 g at 06/15/20 0902  . potassium chloride SA (KLOR-CON) CR tablet 40 mEq  40 mEq Oral Once Ralene Muskrat B, MD      . sertraline (ZOLOFT) tablet 25 mg  25 mg Oral QHS Ivor Costa, MD   25 mg at 06/14/20 2136  . vitamin B-12 (CYANOCOBALAMIN) tablet 1,000 mcg  1,000 mcg Oral Daily Ivor Costa, MD   1,000 mcg at 06/15/20 0902   Facility-Administered Medications Ordered in Other Encounters  Medication Dose Route Frequency Provider Last Rate Last Admin  . sodium chloride flush (NS) 0.9 % injection 10 mL  10 mL Intravenous PRN [name], MD   10 mL at 03/19/20 1015     Discharge Medications: Please see discharge summary for a list of discharge medications.  Relevant Imaging Results:  Relevant Lab Results:   Additional Information YQM:[id number]  Gerrianne Scale Synia Douglass, LCSW

## 2020-06-15 NOTE — TOC Initial Note (Signed)
Transition of Care [facility]) - Initial/Assessment Note    Patient Details  Name: Christopher Delacruz MRN: [id number] Date of Birth: 01/09/1951  Transition of Care [facility]) CM/SW Contact:    Eileen Stanford, LCSW Phone Number: 06/15/2020, 1:35 PM  Clinical Narrative:   CSW spoke with pt's Nephew via telephone.Pt's Nephew confirmed pt was from [facility]. Per Nephew pt was there 2 weeks after a surgery on the 29th. Pt's nephew states he is agreeable for pt to go back to continue therapy. After SNF pt will return to his residence at the [place].                Expected Discharge Plan: Skilled Nursing Facility Barriers to Discharge: Continued Medical Work up   Patient Goals and CMS Choice Patient states their goals for this hospitalization and ongoing recovery are:: for pt to go back to rehab   Choice offered to / list presented to : [place] / Guardian  Expected Discharge Plan and Services Expected Discharge Plan: [place] In-house Referral: Clinical Social Work   Post Acute Care Choice: [place] Living arrangements for the past 2 months: [place]                                      Prior Living Arrangements/Services Living arrangements for the past 2 months: [place] Lives with:: Facility Resident Patient language and need for interpreter reviewed:: Yes Do you feel safe going back to the place where you live?: Yes      Need for Family Participation in Patient Care: Yes (Comment) Care giver support system in place?: Yes (comment)   Criminal Activity/Legal Involvement Pertinent to Current Situation/Hospitalization: No - Comment as needed  Activities of Daily Living      Permission Sought/Granted Permission sought to share information with : Family Supports Permission granted to share information with : Yes, Release of Information Signed  Share Information with NAME: mark  Permission granted to share info w AGENCY:  [facility]  Permission granted to share info w Relationship: HCPOA, [place]     Emotional Assessment Appearance:: Appears stated age Attitude/Demeanor/Rapport: Unable to Assess Affect (typically observed): Unable to Assess Orientation: : Oriented to Self Alcohol / Substance Use: Not Applicable Psych Involvement: No (comment)  Admission diagnosis:  Atrial fibrillation with RVR ([place]) [I48.91] Patient Active Problem List   Diagnosis Date Noted  . CKD (chronic kidney disease), stage IV ([place]) 06/14/2020  . Type II diabetes mellitus with renal manifestations ([place]) 06/14/2020  . Depression 06/14/2020  . Tachycardia 06/14/2020  . Atrial fibrillation with RVR ([place]) 06/14/2020  . Elevated troponin 06/14/2020  . Anxiety disorder 05/27/2020  . Diabetic renal disease ([place]) 05/27/2020  . Hypothyroidism 05/27/2020  . Late effects of cerebrovascular disease 05/27/2020  . Vitamin D deficiency 05/27/2020  . Other vitamin B12 deficiency anemias 05/27/2020  . Expressive aphasia 05/27/2020  . Aortic atherosclerosis ([place]) 05/03/2020  . Port-A-Cath in place 03/19/2020  . Anemia in stage 3b chronic kidney disease ([place]) 03/19/2020  . Pre-op evaluation 01/30/2020  . Aortic valve regurgitation 01/30/2020  . Hypertension associated with diabetes ([place]) 01/30/2020  . Lower extremity edema 01/30/2020  . CKD (chronic kidney disease) stage 3, GFR 30-59 ml/min (HCC) 01/03/2020  . History of CVA (cerebrovascular accident) 01/03/2020  . Cognitive impairment 09/27/2019  . Encounter for antineoplastic chemotherapy 09/27/2019  . Goals of care, counseling/discussion 09/22/2019  .  Swelling of upper arm 09/22/2019  . B12 deficiency 09/17/2019  . Palliative care by specialist   . DNR (do not resuscitate) discussion   . Rectal cancer s/p robotic LAR resectrion 05/25/2020   . Helicobacter pylori gastritis   . Benign prostatic hyperplasia without lower urinary tract symptoms 09/07/2019  . Absolute anemia  09/07/2019  . Iron deficiency anemia due to chronic blood loss 09/07/2019  . Hyperlipidemia 03/21/2018  . Weakness generalized 06/26/2015  . Dysphagia 06/26/2015  . Essential hypertension, malignant 06/26/2015  . Hypokalemia 06/26/2015  . Cerebral infarction ([place]) 06/23/2015  . Diabetes ([place]) 06/23/2015  . HTN (hypertension) 06/23/2015   PCP:  [name], MD Pharmacy:  No Pharmacies Listed    Social Determinants of Health (SDOH) Interventions    Readmission Risk Interventions Readmission Risk Prevention Plan 05/28/2020  Transportation Screening Complete  HRI or [place] Complete  Social Work Consult for [place] Planning/Counseling Complete  Palliative Care Screening Not Applicable  Medication Review Press photographer) Complete  Some recent data might be hidden

## 2020-06-15 NOTE — Progress Notes (Signed)
PROGRESS NOTE    Christopher Delacruz  EHM:[id number] DOB: [birth year] DOA: 06/14/2020 PCP: Christopher Pitch, MD   Brief Narrative: 70 y.o. male with medical history significant of HTN, HLD, DM, stroke with right-sided weakness and aphasia, CKD-IV, anemia, BPH, AVR, rectal CA (s/p of robotic LAR resection 05/25/20), hypothyroidism, depression with anxiety, cognitive impairment, dementia, who presents with tachycardia.  Patient has history of dementia and aphasia secondary to previous stroke, and appears to be very poor historian and is unable to provide accurate medical history, therefore, most of the history is obtained by discussing the case with ED physician, per EMS report, and with the nursing staff.  Per report, pt was found to have tachycardia with in SNF. Per EDP, pt was found to new onset A fib with RVR with HR up to 140s in ED. Pt was given 15 mg of Cardizem IV and started Cardizem drip.  Patient converted to sinus rhythm.  Discussed with cardiology.  Converting to p.o. Cardizem long acting and starting Eliquis.  Patient has a complex urologic and oncologic history.  As such he is a high risk for complications and will need close follow-up.    Assessment & Plan:   Principal Problem:   Atrial fibrillation with RVR (HCC) Active Problems:   HTN (hypertension)   Benign prostatic hyperplasia without lower urinary tract symptoms   Iron deficiency anemia due to chronic blood loss   Rectal cancer s/p robotic LAR resectrion 05/25/2020   History of CVA (cerebrovascular accident)   Hypothyroidism   CKD (chronic kidney disease), stage IV (HCC)   Type II diabetes mellitus with renal manifestations ([place])   Depression   Elevated troponin  Atrial fibrillation/Aflutter with RVR and elevated troponin Started on Cardizem gtt. in ED Rate controlled converted to sinus rhythm Cardiology consulted Plan: Start Cardizem CD 180 mg daily Hold amlodipine and hydrochlorothiazide to give blood pressure  syndrome Check TTE.  This may be delayed as there is only 1 echo tech Monitor electrolytes, K greater than 4, mag greater than 2 Telemetry monitoring Therapy evaluations, out of bed to chair  HTN (hypertension) Amlodipine hydrochlorothiazide on hold Cardizem CD 180 mg daily  Benign prostatic hyperplasia without lower urinary tract symptoms -Proscar  Iron deficiency anemia due to chronic blood loss  Hemoglobin stable 9.7 (8.9 on 05/27/2020 -Continue iron supplement -Monitor closely after Eliquis initiated  Rectal cancer s/p robotic LAR resectrion 05/25/2020 -Follow-up with oncology  History of CVA (cerebrovascular accident) -On aspirin and Plavix  Hypothyroidism: Patient's not taking medications currently -TSH within normal range  CKD (chronic kidney disease), stage IV ([place])  Patient had creatinine 1.7 in February, but recently baseline creatinine is 2.13-2.78.   On admission creatinine 2.41, BUN 42 Patient may have established a baseline Monitor BMP daily Avoid nonessential nephrotoxins  Diet controlled Type II diabetes mellitus with renal manifestations [facility]): Recent A1c 5.1, well controlled.  Patient is not taking medications currently. -check CBG qAM  Depression -Zoloft   DVT prophylaxis: Eliquis Code Status: Partial Family Communication: Left VM for nephew Christopher Delacruz [phone] on 5/20 Disposition Plan: Status is: Observation  The patient will require care spanning > 2 midnights and should be moved to inpatient because: Inpatient level of care appropriate due to severity of illness  Dispo: The patient is from: Home              Anticipated d/c is to: Unknown at this time.  SNF versus home with home health  Patient currently is not medically stable to d/c.   Difficult to place patient No   Rapid atrial fibrillation.  Rate control improved.  Starting long-acting Cardizem.  Will need echocardiogram and further monitoring and titration of rate  control medications.    Level of care: Progressive Cardiac  Consultants:   Cardiology-[facility]  Procedures:   None  Antimicrobials:   None   Subjective: Seen and examined.  History limited by underlying dementia and hearing loss.  No specific complaints.  Objective: Vitals:   06/15/20 0300 06/15/20 0345 06/15/20 0539 06/15/20 0700  BP: 120/71 (!) 118/59  (!) 167/69  Pulse:  88  98  Resp: 20 20    Temp:  97.8 F (36.6 C)  98.7 F (37.1 C)  TempSrc:  Oral  Oral  SpO2:  97%  98%  Weight:   103.8 kg   Height:        Intake/Output Summary (Last 24 hours) at 06/15/2020 1047 Last data filed at 06/15/2020 0600 Gross per 24 hour  Intake 115.09 ml  Output --  Net 115.09 ml   Filed Weights   06/14/20 1202 06/15/20 0539  Weight: 113.4 kg 103.8 kg    Examination:  General exam: Appears calm and comfortable  Respiratory system: Poor respiratory effort.  Lungs clear.  Room air Cardiovascular system: S1-S2, regular rate and rhythm, no murmurs Gastrointestinal system: Abdomen is nondistended, soft and nontender. No organomegaly or masses felt. Normal bowel sounds heard. Central nervous system: Alert, oriented x1, no focal deficits extremities: Symmetric 5 x 5 power. Skin: No rashes, lesions or ulcers Psychiatry: Judgement and insight appear impaired. Mood & affect flattened.     Data Reviewed: I have personally reviewed following labs and imaging studies  CBC: Recent Labs  Lab 06/14/20 1220 06/15/20 0344  WBC 9.5 8.7  HGB 9.7* 8.9*  HCT 31.2* 28.0*  MCV 88.1 87.0  PLT 591* 161*   Basic Metabolic Panel: Recent Labs  Lab 06/14/20 1220 06/15/20 0344  NA 138 139  K 4.0 3.6  CL 102 107  CO2 24 23  GLUCOSE 174* 144*  BUN 42* 40*  CREATININE 2.41* 2.18*  CALCIUM 9.3 8.7*  MG 2.5*  --    GFR: Estimated Creatinine Clearance: 40.5 mL/min (A) (by C-G formula based on SCr of 2.18 mg/dL (H)). Liver Function Tests: No results for input(s): AST, ALT, ALKPHOS,  BILITOT, PROT, ALBUMIN in the last 168 hours. No results for input(s): LIPASE, AMYLASE in the last 168 hours. No results for input(s): AMMONIA in the last 168 hours. Coagulation Profile: Recent Labs  Lab 06/14/20 1220  INR 1.2   Cardiac Enzymes: No results for input(s): CKTOTAL, CKMB, CKMBINDEX, TROPONINI in the last 168 hours. BNP (last 3 results) No results for input(s): PROBNP in the last 8760 hours. HbA1C: Recent Labs    06/15/20 0344  HGBA1C 6.0*   CBG: Recent Labs  Lab 06/15/20 0807  GLUCAP 157*   Lipid Profile: Recent Labs    06/15/20 0344  CHOL 131  HDL 30*  LDLCALC 81  TRIG 100  CHOLHDL 4.4   Thyroid Function Tests: Recent Labs    06/15/20 0344  TSH 1.978   Anemia Panel: No results for input(s): VITAMINB12, FOLATE, FERRITIN, TIBC, IRON, RETICCTPCT in the last 72 hours. Sepsis Labs: No results for input(s): PROCALCITON, LATICACIDVEN in the last 168 hours.  Recent Results (from the past 240 hour(s))  Resp Panel by RT-PCR (Flu A&B, Covid) Nasopharyngeal Swab     Status: None  Collection Time: 06/14/20  2:40 PM   Specimen: Nasopharyngeal Swab; Nasopharyngeal(NP) swabs in vial transport medium  Result Value Ref Range Status   SARS Coronavirus 2 by RT PCR NEGATIVE NEGATIVE Final    Comment: (NOTE) SARS-CoV-2 target nucleic acids are NOT DETECTED.  The SARS-CoV-2 RNA is generally detectable in upper respiratory specimens during the acute phase of infection. The lowest concentration of SARS-CoV-2 viral copies this assay can detect is 138 copies/mL. A negative result does not preclude SARS-Cov-2 infection and should not be used as the sole basis for treatment or other patient management decisions. A negative result may occur with  improper specimen collection/handling, submission of specimen other than nasopharyngeal swab, presence of viral mutation(s) within the areas targeted by this assay, and inadequate number of viral copies(<138 copies/mL). A  negative result must be combined with clinical observations, patient history, and epidemiological information. The expected result is Negative.  Fact Sheet for Patients:  EntrepreneurPulse.com.au  Fact Sheet for Healthcare Providers:  IncredibleEmployment.be  This test is no t yet approved or cleared by the [place] FDA and  has been authorized for detection and/or diagnosis of SARS-CoV-2 by FDA under an Emergency Use Authorization (EUA). This EUA will remain  in effect (meaning this test can be used) for the duration of the COVID-19 declaration under Section 564(b)(1) of the Act, 21 U.S.C.section 360bbb-3(b)(1), unless the authorization is terminated  or revoked sooner.       Influenza A by PCR NEGATIVE NEGATIVE Final   Influenza B by PCR NEGATIVE NEGATIVE Final    Comment: (NOTE) The Xpert Xpress SARS-CoV-2/FLU/RSV plus assay is intended as an aid in the diagnosis of influenza from Nasopharyngeal swab specimens and should not be used as a sole basis for treatment. Nasal washings and aspirates are unacceptable for Xpert Xpress SARS-CoV-2/FLU/RSV testing.  Fact Sheet for Patients: EntrepreneurPulse.com.au  Fact Sheet for Healthcare Providers: IncredibleEmployment.be  This test is not yet approved or cleared by the [place] FDA and has been authorized for detection and/or diagnosis of SARS-CoV-2 by FDA under an Emergency Use Authorization (EUA). This EUA will remain in effect (meaning this test can be used) for the duration of the COVID-19 declaration under Section 564(b)(1) of the Act, 21 U.S.C. section 360bbb-3(b)(1), unless the authorization is terminated or revoked.  Performed at [facility], [address], [place], [place] [address]          Radiology Studies: [facility] Chest [place] 1 View  Result Date: 06/14/2020 CLINICAL DATA:  Tachycardia. EXAM: PORTABLE CHEST 1 VIEW  COMPARISON:  09/14/2013 FINDINGS: The right IJ power port is stable. The heart is within normal limits in size given the AP projection and portable technique. There is moderate tortuosity and mild calcification of the thoracic aorta. No acute pulmonary findings. No pleural effusion or pulmonary lesions. The bony thorax is intact. IMPRESSION: No acute cardiopulmonary findings. Electronically Signed   By: Marijo Sanes M.D.   On: 06/14/2020 13:28        Scheduled Meds: . cholecalciferol  2,000 Units Oral Daily  . diltiazem  120 mg Oral Once  . [START ON 06/16/2020] diltiazem  180 mg Oral Daily  . enoxaparin (LOVENOX) injection  40 mg Subcutaneous Q24H  . ferrous sulfate  325 mg Oral Daily  . finasteride  5 mg Oral Daily  . multivitamin  1 tablet Oral Daily  . polyethylene glycol  17 g Oral Daily  . sertraline  25 mg Oral QHS  . vitamin B-12  1,000  mcg Oral Daily   Continuous Infusions:   LOS: 0 days    Time spent: 25 minutes    Sidney Ace, MD [facility] Pager [phone]  If 7PM-7AM, please contact night-coverage 06/15/2020, 10:47 AM

## 2020-06-15 NOTE — Consult Note (Signed)
Cardiology Consultation:   Patient ID: Christopher Delacruz MRN: [id number]; DOB: April 13, 1950  Admit date: 06/14/2020 Date of Consult: 06/15/2020  PCP:  [name], MD   [facility] Providers Physician requesting consult: Blaine Hamper Reason for consult: Atrial fib/flutter Outpatient cardiologist:  Dr. Gasper Sells   Patient Profile:   Christopher Delacruz is a 70 y.o. male with a hx of HTN, HLD, DM, stroke with right-sided weakness and aphasia, CKD-IV, anemia, BPH, AVR, rectal CA (s/p of robotic LAR resection 05/25/20), hypothyroidism, depression with anxiety, cognitive impairment, dementia, who presents with tachycardia/atrial fibrillation/flutter   History of Present Illness:   Christopher Delacruz is a poor historian given history of dementia, aphasia from previous stroke, most of the details of his presentation obtained from reviewing chart notes.  Notes indicate he was found at the nursing home to have tachycardia heart rates up to 140 on arrival.  Notes in the computer indicate he converted to normal sinus rhythm in the emergency room after given Cardizem IV with Cardizem infusion though there is no indication normal sinus rhythm was restored and he has remained in persistent atrial fibs/flutter. On Cardizem infusion overnight 5 mg/h rates in the low 100 range, asymptomatic  COVID-negative, No indication of hypoxia saturations 96% room air on arrival, chest x-ray clear He denies any lower extremity edema, PND orthopnea  Review of notes indicates recent surgery Robotic assisted lower anterior rectosigmoid resection Surgery performed May 25, 2020 with discharge from the hospital May 31, 2020 Prior EKG documented January 30, 2020 showing normal sinus rhythm EKG this admission showing atrial flutter rate 142 bpm   Past Medical History:  Diagnosis Date  . Acute renal failure superimposed on stage 3a chronic kidney disease ([place]) 09/07/2019  . B12 deficiency 09/17/2019  . Cancer [facility])    rectal cancer   .  CVA (cerebral vascular accident) ([place])   . Diabetes mellitus ([place])    type 2 - controlled by diet on no meds   . Heart murmur   . Hyperlipidemia   . Hypertension   . Right hemiparesis ([place]) 2017   Associated with stroke    Past Surgical History:  Procedure Laterality Date  . COLONOSCOPY N/A 09/11/2019   Procedure: COLONOSCOPY;  Surgeon: [name], MD;  Location: [facility] ENDOSCOPY;  Service: Endoscopy;  Laterality: N/A;  . COLONOSCOPY WITH PROPOFOL N/A 09/12/2019   Procedure: COLONOSCOPY WITH PROPOFOL;  Surgeon: [name], MD;  Location: [facility] ENDOSCOPY;  Service: Gastroenterology;  Laterality: N/A;  . COLONOSCOPY WITH PROPOFOL N/A 09/13/2019   Procedure: COLONOSCOPY WITH PROPOFOL;  Surgeon: [name], MD;  Location: [facility] ENDOSCOPY;  Service: Gastroenterology;  Laterality: N/A;  . CYSTOSCOPY N/A 05/25/2020   Procedure: CYSTOSCOPY WITH FIREFLY INJECTIONS AND BILATERAL OPEN ENDED CATHETER PLACEMENT;  Surgeon: Alexis Frock, MD;  Location: [facility] ORS;  Service: Urology;  Laterality: N/A;  . ESOPHAGOGASTRODUODENOSCOPY N/A 09/11/2019   Procedure: ESOPHAGOGASTRODUODENOSCOPY (EGD);  Surgeon: [name], MD;  Location: [facility] ENDOSCOPY;  Service: Endoscopy;  Laterality: N/A;  . EXPLORE EYE SOCKET    . PORTACATH PLACEMENT N/A 09/15/2019   Procedure: INSERTION PORT-A-CATH;  Surgeon: Jules Husbands, MD;  Location: [facility] ORS;  Service: General;  Laterality: N/A;  . PROCTOSCOPY N/A 05/25/2020   Procedure: RIGID PROCTOSCOPY;  Surgeon: [name], MD;  Location: [facility] ORS;  Service: General;  Laterality: N/A;  . XI ROBOTIC ASSISTED LOWER ANTERIOR RESECTION N/A 05/25/2020   Procedure: XI ROBOTIC ASSISTED LOWER ANTERIOR RECTOSIGMOID RESECTION WITH MOBILIZATION OF THE SPENIC FLEXURE, BILATERAL TAP BLOCK AND INTRAOPERATIVE ASSESMENT OF PERFUSION;  Surgeon: [name], MD;  Location: [facility] ORS;  Service: General;  Laterality: N/A;     Home Medications:  Prior to Admission medications   Medication Sig Start Date  End Date Taking? Authorizing Provider  amLODipine (NORVASC) 10 MG tablet Take 1 tablet (10 mg total) by mouth daily. 03/23/18  Yes Demetrios Loll, MD  aspirin EC 81 MG EC tablet Take 1 tablet (81 mg total) by mouth daily. Swallow whole. 09/17/19  Yes Shahmehdi, Valeria Batman, MD  Cholecalciferol (VITAMIN D3) 50 MCG (2000 UT) capsule Take 2,000 Units by mouth daily. 04/24/20  Yes [provider]  clopidogrel (PLAVIX) 75 MG tablet Take 75 mg by mouth daily.   Yes [provider]  ferrous sulfate 325 (65 FE) MG tablet Take 1 tablet (325 mg total) by mouth daily. 09/16/19 09/15/20 Yes Shahmehdi, Seyed A, MD  finasteride (PROSCAR) 5 MG tablet Take 5 mg by mouth daily.   Yes [provider]  GAVILAX 17 GM/SCOOP powder Take 17 g by mouth daily. 05/23/20  Yes [provider]  hydrochlorothiazide (HYDRODIURIL) 12.5 MG tablet Take 12.5 mg by mouth daily. 04/23/20  Yes [provider]  multivitamin (ONE-A-DAY MEN'S) TABS tablet Take 1 tablet by mouth daily.   Yes [provider]  ondansetron (ZOFRAN) 8 MG tablet Take 1 tablet (8 mg total) by mouth 2 (two) times daily as needed for refractory nausea / vomiting. Start on day 3 after chemotherapy. Patient taking differently: Take 2 mg by mouth 2 (two) times daily as needed for nausea (Start on day 3 after chemotherapy.). 09/27/19  Yes [name], MD  prochlorperazine (COMPAZINE) 10 MG tablet Take 10 mg by mouth every 6 (six) hours as needed for nausea or vomiting.   Yes [provider]  sertraline (ZOLOFT) 25 MG tablet Take 25 mg by mouth at bedtime. 04/23/20  Yes [provider]  vitamin B-12 (CYANOCOBALAMIN) 1000 MCG tablet Take 1,000 mcg by mouth daily.   Yes [provider]  traMADol (ULTRAM) 50 MG tablet Take 1-2 tablets (50-100 mg total) by mouth every 6 (six) hours as needed for moderate pain. Patient not taking: Reported on 06/14/2020 05/29/20   [name], MD    Inpatient  Medications: Scheduled Meds: . amLODipine  10 mg Oral Daily  . aspirin EC  81 mg Oral Daily  . cholecalciferol  2,000 Units Oral Daily  . clopidogrel  75 mg Oral Daily  . enoxaparin (LOVENOX) injection  40 mg Subcutaneous Q24H  . ferrous sulfate  325 mg Oral Daily  . finasteride  5 mg Oral Daily  . hydrochlorothiazide  12.5 mg Oral Daily  . multivitamin  1 tablet Oral Daily  . polyethylene glycol  17 g Oral Daily  . sertraline  25 mg Oral QHS  . vitamin B-12  1,000 mcg Oral Daily   Continuous Infusions: . diltiazem (CARDIZEM) infusion 5 mg/hr (06/15/20 0441)   PRN Meds: acetaminophen, hydrALAZINE, ondansetron (ZOFRAN) IV  Allergies:   No Known Allergies  Social History:   Social History   Socioeconomic History  . Marital status: Divorced    Spouse name: Not on file  . Number of children: Not on file  . Years of education: Not on file  . Highest education level: Not on file  Occupational History  . 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 Smoking status: Never Smoker  . Smokeless tobacco: Never Used  Vaping Use  . Vaping Use: Never used  Substance and Sexual Activity  . Alcohol use: Never    Alcohol/week: 0.0 standard drinks  . Drug use: Never  . Sexual activity: Not on file  Other Topics Concern  . Not on file  Social History Narrative   Nephew with Justice due to cognitive impairment of patient after stroke 2017      Lives in [place] in [place], [place]   Social Determinants of Health   Financial Resource Strain: Not on file  Food Insecurity: Not on file  Transportation Needs: Unmet Transportation Needs  . Lack of Transportation (Medical): Yes  . Lack of Transportation (Non-Medical): Yes  Physical Activity: Not on file  Stress: Not on file  Social Connections: Not on file  Intimate Partner Violence: Not on file    Family History:    Family  History  Problem Relation Age of Onset  . Heart attack Sister   . Leukemia Brother   . Stroke Brother      ROS:  Please see the history of present illness.  Review of Systems  Constitutional: Negative.   HENT: Negative.   Respiratory: Negative.   Cardiovascular: Negative.   Gastrointestinal: Negative.   Musculoskeletal: Negative.   Neurological: Negative.   Psychiatric/Behavioral: Negative.   All other systems reviewed and are negative.   Physical Exam/Data:   Vitals:   06/15/20 0300 06/15/20 0345 06/15/20 0539 06/15/20 0700  BP: 120/71 (!) 118/59  (!) 167/69  Pulse:  88  98  Resp: 20 20    Temp:  97.8 F (36.6 C)  98.7 F (37.1 C)  TempSrc:  Oral  Oral  SpO2:  97%  98%  Weight:   103.8 kg   Height:        Intake/Output Summary (Last 24 hours) at 06/15/2020 1018 Last data filed at 06/15/2020 0600 Gross per 24 hour  Intake 115.09 ml  Output --  Net 115.09 ml   Last 3 Weights 06/15/2020 06/14/2020 05/29/2020  Weight (lbs) 228 lb 13.4 oz 250 lb 241 lb 6.5 oz  Weight (kg) 103.8 kg 113.399 kg 109.5 kg  Some encounter information is confidential and restricted. Go to Review Flowsheets activity to see all data.     Body mass index is 28.6 kg/m.  General:  Well nourished, well developed, in no acute distress HEENT: normal Lymph: no adenopathy Neck: no JVD Endocrine:  No thryomegaly Vascular: No carotid bruits; FA pulses 2+ bilaterally without bruits  Cardiac: Irregular no murmur  Lungs:  clear to auscultation bilaterally, no wheezing, rhonchi or rales  Abd: soft, nontender, no hepatomegaly  Ext: no edema Musculoskeletal:  No deformities, BUE and BLE strength normal and equal Skin: warm and dry  Neuro:  CNs 2-12 intact, no focal abnormalities noted Psych: Mild confusion  EKG:  The EKG was personally reviewed and demonstrates:   Atrial flutter rate 142 bpm Telemetry:  Telemetry was personally reviewed and demonstrates:   Fib flutter with variable rate  Relevant  CV Studies:  Echocardiogram pending Prior echocardiogram ordered January 30, 2020 not completed or at least not in the system  Laboratory Data:  High Sensitivity Troponin:   Recent Labs  Lab 06/14/20 1220 06/14/20 1804 06/14/20 2051  TROPONINIHS 19* 19* 18*     Chemistry Recent Labs  Lab 06/14/20 1220 06/15/20 0344  NA 138 139  K 4.0 3.6  CL 102 107  CO2 24 23  GLUCOSE 174* 144*  BUN 42* 40*  CREATININE 2.41* 2.18*  CALCIUM 9.3 8.7*  GFRNONAA 28* 32*  ANIONGAP 12 9    No results for input(s): PROT, ALBUMIN, AST, ALT, ALKPHOS, BILITOT in the last 168 hours. Hematology Recent Labs  Lab 06/14/20 1220 06/15/20 0344  WBC 9.5 8.7  RBC 3.54* 3.22*  HGB 9.7* 8.9*  HCT 31.2* 28.0*  MCV 88.1 87.0  MCH 27.4 27.6  MCHC 31.1 31.8  RDW 13.2 13.2  PLT 591* 466*   BNPNo results for input(s): BNP, PROBNP in the last 168 hours.  DDimer No results for input(s): DDIMER in the last 168 hours.   Radiology/Studies:  DG Chest Port 1 View  Result Date: 06/14/2020 CLINICAL DATA:  Tachycardia. EXAM: PORTABLE CHEST 1 VIEW COMPARISON:  09/14/2013 FINDINGS: The right IJ power port is stable. The heart is within normal limits in size given the AP projection and portable technique. There is moderate tortuosity and mild calcification of the thoracic aorta. No acute pulmonary findings. No pleural effusion or pulmonary lesions. The bony thorax is intact. IMPRESSION: No acute cardiopulmonary findings. Electronically Signed   By: Marijo Sanes M.D.   On: 06/14/2020 13:28     Assessment and Plan:   1. Atrial fib flutter with RVR Appears to be new onset compared to EKG dated January 2022, may have developed postoperatively -Given unclear onset, not a good candidate for cardioversion He is relatively asymptomatic -Echocardiogram pending, unable this will be completed today as hospital only has 1 technician.  May need to be completed as outpatient -Would recommend 3 to 4 weeks of therapeutic  Eliquis, follow-up with primary cardiologist, consideration of rate control versus rhythm control at that time depending on echocardiogram results and symptoms -- Certainly risk of bleeding given underlying oncologic issues, will need to monitor hemoglobin closely as outpatient -We will stop aspirin, Plavix, amlodipine, HCTZ to make room for long-acting diltiazem 180 mg daily with Eliquis (no renal dosing adjustment needed given less than 12 years old and weight over 60 kg)  2) chronic renal insufficiency In the setting of type 2 diabetes Appears to be at his baseline or better  3)Essential hypertension Hold amlodipine, hold HCTZ to make room for diltiazem extended release Diltiazem ER 120 today as amlodipine HCTZ already given Transition to diltiazem ER 180 mg starting tomorrow, continue to hold amlodipine and HCTZ  4.  Rectal cancer, status post robotic LAR resection Several weeks ago, has had time to recover,  5. History of stroke Aspirin Plavix held now on Eliquis 5 twice daily We will need to monitor hemoglobin closely     Total encounter time more than 110 minutes  Greater than 50% was spent in counseling and coordination of care with the patient    For questions or updates, please contact [facility] Please consult [email or web address] for contact info under    Signed, Ida Rogue, MD  06/15/2020 10:18 AM

## 2020-06-15 NOTE — Progress Notes (Signed)
   06/15/20 2040  Assess: MEWS Score  Pulse Rate (!) 135  Assess: MEWS Score  MEWS Temp 0  MEWS Systolic 0  MEWS Pulse 3  MEWS RR 0  MEWS LOC 0  MEWS Score 3  MEWS Score Color Yellow  Assess: if the MEWS score is Yellow or Red  Were vital signs taken at a resting state? Yes  Focused Assessment Change from prior assessment (see assessment flowsheet)  Early Detection of Sepsis Score *See Row Information* Low  MEWS guidelines implemented *See Row Information* Yes  Treat  MEWS Interventions Escalated (See documentation below);Administered prn meds/treatments  Pain Scale 0-10  Pain Score 0  Take Vital Signs  Increase Vital Sign Frequency  Yellow: Q 2hr X 2 then Q 4hr X 2, if remains yellow, continue Q 4hrs  Escalate  MEWS: Escalate Yellow: discuss with charge nurse/RN and consider discussing with provider and RRT  Notify: Charge Nurse/RN  Name of Charge Nurse/RN Notified Mar[place] RN  Date Charge Nurse/RN Notified 06/15/20  Time Charge Nurse/RN Notified 2045  Document  Patient Outcome Stabilized after interventions  Progress note created (see row info) Yes  Patient sustaining HR in 130's.  PRN metoprolol given.  Will continue to monitor

## 2020-06-15 NOTE — Progress Notes (Signed)
OT Cancellation Note  Patient Details Name: Christopher Delacruz MRN: [id number] DOB: 14-Nov-1950   Cancelled Treatment:    Reason Eval/Treat Not Completed: Patient declined, no reason specified;Other (comment). Thank you for the OT consult. Order received and chart reviewed. Spoke with primary RN who cleared pt for OT evaluation. Upon arrival to pt room, pt sleeping in bed with tele-monitor alarming (low battery alert displayed on screen). Pt rouses to VCs. He tells therapist "I don't feel good" and declines to participate in OT evaluation. Pt begins to become minimally agitated yelling "get out" and "turn that light off". RN notified.   Will re-attempt at a later date/time as available and pt medically appropriate for OT evaluation.   Shara Blazing, M.S., OTR/L Ascom: [phone] 06/15/20, 1:26 PM

## 2020-06-15 NOTE — Progress Notes (Signed)
PHARMACIST - PHYSICIAN COMMUNICATION  CONCERNING:  Enoxaparin (Lovenox) for DVT Prophylaxis    RECOMMENDATION: Patient was prescribed enoxaprin 0.5mg /kg (57.5 mg) q24 hours for VTE prophylaxis.   Filed Weights   06/14/20 1202 06/15/20 0539  Weight: 113.4 kg (250 lb) 103.8 kg (228 lb 13.4 oz)    Body mass index is 28.6 kg/m.  Estimated Creatinine Clearance: 40.5 mL/min (A) (by C-G formula based on SCr of 2.18 mg/dL (H)).  Pt recorded weight dropped from 113.4 to 103.8 kg on 5/20  Based on [facility] policy patient is candidate for enoxaparin 40 mg SQ every 24 hours based on BMI being <30 and CrCl > 30 mL/min.  DESCRIPTION: Pharmacy has adjusted enoxaparin dose per [facility] policy.  Patient is now receiving enoxaparin 40 mg every 24 hours    Benn Moulder, PharmD Pharmacy Resident  06/15/2020 9:16 AM

## 2020-06-15 NOTE — Evaluation (Signed)
Physical Therapy Evaluation Patient Details Name: Christopher Delacruz MRN: [id number] DOB: 06-06-1950 Today's Date: 06/15/2020   History of Present Illness  Christopher Delacruz is a 40yoM who comes to [facility] on 5/19 from STR at [facility], where he has been for 2 weeks. Pt at baseline resides at [facility]. Comes in via [facility] for tachycardia. 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 Pt admtted with AF/RVR.  Clinical Impression  Pt admitted with above diagnosis. Pt currently with functional limitations due to the deficits listed below (see "PT Problem List"). Upon entry, pt in bed, asleep, and agreeable to participate. The pt is alert, pleasant, interactive, and able to confirm some info regarding prior level of function, both in tolerance and independence. His expressive aphasia limited his word availability in response. Pt requires an elevated EOB to rise to standing with RW, but is able to AMB 44ft in room with RW, only 1 LOB that requires minA from author to correct. Pt has made great progress at STR since prior admission 2 weeks ago, but remain off baseline. AF/RVR well controlled at rest, slightly aggravated after coming to sitting, but rate is more regular after AMB a little (117bpm).  Patient's performance this date reveals decreased ability, independence, and tolerance in performing all basic mobility required for performance of activities of daily living. Pt requires additional DME, close physical assistance, and cues for safe participate in mobility. Pt will benefit from skilled PT intervention to increase independence and safety with basic mobility in preparation for discharge to the venue listed below.       Follow Up Recommendations SNF;Supervision for mobility/OOB;Supervision - Intermittent    Equipment Recommendations  None recommended by PT    Recommendations for Other Services        Precautions / Restrictions Precautions Precautions: Fall Restrictions Weight Bearing Restrictions: No      Mobility  Bed Mobility Overal bed mobility: Needs Assistance Bed Mobility: Supine to Sit;Sit to Supine     Supine to sit: Supervision Sit to supine: Supervision   General bed mobility comments: moderate effort required; much improved from 2WA at [facility] (needed Max+2)    Transfers Overall transfer level: Needs assistance Equipment used: Rolling walker (2 wheeled) Transfers: Sit to/from Stand Sit to Stand: Min guard;From elevated surface         General transfer comment: 3 attempts, unsuccess, then able from elevated surface; too weak 2 weeks ago when admitted.  Ambulation/Gait Ambulation/Gait assistance: Min assist Gait Distance (Feet): 60 Feet (AF c RVR inititally aggravated upon sitting c rates jumping to 130s, but after AMB rate more regular at 117 BPM.) Assistive device: Rolling walker (2 wheeled)       General Gait Details: weakness on Right; one LOB to right when AMB begine, pt braces self with RUE on wall, then author assists with righting. Pt able to AMB farthur with multiple trns without LOB; reports balance not quite at baseline.  Stairs            Wheelchair Mobility    Modified Rankin (Stroke Patients Only)       Balance Overall balance assessment: Needs assistance Sitting-balance support: Single extremity supported;Feet supported Sitting balance-Leahy Scale: Good     Standing balance support: During functional activity Standing balance-Leahy Scale: Poor  Pertinent Vitals/Pain Pain Assessment: No/denies pain    Home Living Family/patient expects to be discharged to:: Skilled nursing facility (at [facility]; resides at [facility]F at [facility])   Available Help at Discharge: Available 24 hours/day;Other (Comment) Type of Home: Assisted living Home Access: Level entry      Home Layout: One level Home Equipment: Cane - single point;Walker - 4 wheels Additional Comments: Pt able to confirm most of info with moderate expressive aphasia    Prior Function                 Hand Dominance        Extremity/Trunk Assessment                Communication   Communication: Expressive difficulties  Cognition Arousal/Alertness: Awake/alert Behavior During Therapy: WFL for tasks assessed/performed Overall Cognitive Status: Within Functional Limits for tasks assessed                                        General Comments      Exercises     Assessment/Plan    PT Assessment Patient needs continued PT services  PT Problem List Decreased strength;Decreased range of motion;Decreased activity tolerance;Decreased balance;Decreased mobility;Decreased cognition;Decreased knowledge of use of DME;Decreased safety awareness;Cardiopulmonary status limiting activity;Obesity;Pain       PT Treatment Interventions DME instruction;Gait training;Functional mobility training;Therapeutic activities;Therapeutic exercise;Balance training;Patient/family education    PT Goals (Current goals can be found in the Care Plan section)  Acute Rehab PT Goals Patient Stated Goal: regain baseine level of independence to return to ALF PT Goal Formulation: With patient Time For Goal Achievement: 06/29/20 Potential to Achieve Goals: Good    Frequency Min 2X/week   Barriers to discharge        Co-evaluation               AM-PAC PT "6 Clicks" Mobility  Outcome Measure Help needed turning from your back to your side while in a flat bed without using bedrails?: A Little Help needed moving from lying on your back to sitting on the side of a flat bed without using bedrails?: A Little Help needed moving to and from a bed to a chair (including a wheelchair)?: A Lot Help needed standing up from a chair using your arms (e.g., wheelchair or bedside chair)?:  A Lot Help needed to walk in hospital room?: A Lot Help needed climbing 3-5 steps with a railing? : A Lot 6 Click Score: 14    End of Session   Activity Tolerance: Patient tolerated treatment well;Treatment limited secondary to medical complications (Comment) (AF c RVR) Patient left: in bed;with call bell/phone within reach;with nursing/sitter in room Nurse Communication: Mobility status PT Visit Diagnosis: Other abnormalities of gait and mobility (R26.89);Muscle weakness (generalized) (M62.81)    Time: 8527-7824 PT Time Calculation (min) (ACUTE ONLY): 16 min   Charges:   PT Evaluation $PT Eval Moderate Complexity: 1 Mod     4:10 PM, 06/15/20 Etta Grandchild, PT, DPT Physical Therapist - [facility]  [phone] ([place])      [place] C 06/15/2020, 4:05 PM

## 2020-06-15 NOTE — TOC Progression Note (Signed)
Transition of Care [facility]) - Progression Note    Patient Details  Name: Christopher Delacruz MRN: [id number] Date of Birth: [birth year]  [facility]) CM/SW Contact  Eileen Stanford, LCSW Phone Number: 06/15/2020, 1:28 PM  Clinical Narrative:   CSW called pt's Nephew to complete assessment to determine wishes for placement however, had to leave voicemail. Pt came from [facility].         Expected Discharge Plan and Services                                                 Social Determinants of Health (SDOH) Interventions    Readmission Risk Interventions Readmission Risk Prevention Plan 05/28/2020  Transportation Screening Complete  HRI or Home Care Consult Complete  Social Work Consult for [place] Planning/Counseling Complete  Palliative Care Screening Not Applicable  Medication Review Press photographer) Complete  Some recent data might be hidden

## 2020-06-15 NOTE — Care Management Obs Status (Addendum)
[place] NOTIFICATION   Patient Details  Name: Christopher Delacruz MRN: [id number] Date of Birth: February 23, 1950   Medicare Observation Status Notification Given:  Yes  Patient is only alert to self. CSW called pt's nephew--had to leave voicemail. Unable to obtain signature. Nurse will put letter in pt's dc paperwork.  1:32--Pt's nephew called and verbalized understanding of Observation Letter and will pick up the letter when he visits today.  Shavonta Gossen A Syrita Dovel, LCSW 06/15/2020, 1:16 PM

## 2020-06-16 DIAGNOSIS — I4891 Unspecified atrial fibrillation: Secondary | ICD-10-CM | POA: Diagnosis not present

## 2020-06-16 DIAGNOSIS — R778 Other specified abnormalities of plasma proteins: Secondary | ICD-10-CM | POA: Diagnosis not present

## 2020-06-16 DIAGNOSIS — N184 Chronic kidney disease, stage 4 (severe): Secondary | ICD-10-CM | POA: Diagnosis not present

## 2020-06-16 DIAGNOSIS — I480 Paroxysmal atrial fibrillation: Secondary | ICD-10-CM

## 2020-06-16 DIAGNOSIS — Z8673 Personal history of transient ischemic attack (TIA), and cerebral infarction without residual deficits: Secondary | ICD-10-CM | POA: Diagnosis not present

## 2020-06-16 LAB — GLUCOSE, CAPILLARY
Glucose-Capillary: 133 mg/dL — ABNORMAL HIGH (ref 70–99)
Glucose-Capillary: 144 mg/dL — ABNORMAL HIGH (ref 70–99)

## 2020-06-16 LAB — MAGNESIUM: Magnesium: 2.4 mg/dL (ref 1.7–2.4)

## 2020-06-16 LAB — CBC
HCT: 29.5 % — ABNORMAL LOW (ref 39.0–52.0)
Hemoglobin: 9.3 g/dL — ABNORMAL LOW (ref 13.0–17.0)
MCH: 27.5 pg (ref 26.0–34.0)
MCHC: 31.5 g/dL (ref 30.0–36.0)
MCV: 87.3 fL (ref 80.0–100.0)
Platelets: 413 10*3/uL — ABNORMAL HIGH (ref 150–400)
RBC: 3.38 MIL/uL — ABNORMAL LOW (ref 4.22–5.81)
RDW: 13.5 % (ref 11.5–15.5)
WBC: 9.5 10*3/uL (ref 4.0–10.5)
nRBC: 0 % (ref 0.0–0.2)

## 2020-06-16 LAB — BASIC METABOLIC PANEL
Anion gap: 9 (ref 5–15)
BUN: 38 mg/dL — ABNORMAL HIGH (ref 8–23)
CO2: 24 mmol/L (ref 22–32)
Calcium: 8.9 mg/dL (ref 8.9–10.3)
Chloride: 107 mmol/L (ref 98–111)
Creatinine, Ser: 2.17 mg/dL — ABNORMAL HIGH (ref 0.61–1.24)
GFR, Estimated: 32 mL/min — ABNORMAL LOW (ref >60)
Glucose, Bld: 129 mg/dL — ABNORMAL HIGH (ref 70–99)
Potassium: 4.1 mmol/L (ref 3.5–5.1)
Sodium: 140 mmol/L (ref 135–145)

## 2020-06-16 NOTE — Progress Notes (Signed)
PROGRESS NOTE    Christopher Delacruz  [facility]:[id number] DOB: 1950-09-15 DOA: 06/14/2020 PCP: [name], MD   Brief Narrative:   70 y.o. male with medical history significant of HTN, HLD, DM, stroke with right-sided weakness and aphasia, CKD-IV, anemia, BPH, AVR, rectal CA (s/p of robotic LAR resection 05/25/20), hypothyroidism, depression with anxiety, cognitive impairment, dementia, who presents with tachycardia.  Patient has history of dementia and aphasia secondary to previous stroke, and appears to be very poor historian and is unable to provide accurate medical history, therefore, most of the history is obtained by discussing the case with ED physician, per EMS report, and with the nursing staff.  Per report, pt was found to have tachycardia with in SNF. Per EDP, pt was found to new onset A fib with RVR with HR up to 140s in ED. Pt was given 15 mg of Cardizem IV and started Cardizem drip.  Patient converted to sinus rhythm.  Discussed with cardiology.  Converting to p.o. Cardizem long acting and starting Eliquis.  Patient has a complex urologic and oncologic history.  As such he is a high risk for complications and will need close follow-up.  Overall rate controlled with occasional bursts of rapid atrial fibrillation.  Control beta-blocker.    Assessment & Plan:   Principal Problem:   Atrial fibrillation with RVR (HCC) Active Problems:   HTN (hypertension)   Benign prostatic hyperplasia without lower urinary tract symptoms   Iron deficiency anemia due to chronic blood loss   Rectal cancer s/p robotic LAR resectrion 05/25/2020   History of CVA (cerebrovascular accident)   Hypothyroidism   CKD (chronic kidney disease), stage IV (HCC)   Type II diabetes mellitus with renal manifestations ([place])   Depression   Elevated troponin   Atrial fibrillation with rapid ventricular response (HCC)  Atrial fibrillation/Aflutter with RVR and elevated troponin Started on Cardizem gtt. in ED Rate  controlled converted to sinus rhythm Cardiology consulted Overall rate improved with short burst of rapid rate Echocardiogram pending Plan: Start Cardizem CD 180 mg daily As needed metoprolol pushes for sustained tachycardia Hold amlodipine and hydrochlorothiazide to to allow room for Cardizem Monitor electrolytes, K greater than 4, mag greater than 2 Telemetry monitoring Therapy evaluations, out of bed to chair Follow any further cardiac recommendations  HTN (hypertension) Amlodipine hydrochlorothiazide on hold Cardizem CD 180 mg daily  Benign prostatic hyperplasia without lower urinary tract symptoms -Proscar  Iron deficiency anemia due to chronic blood loss  Hemoglobin stable 9.7 (8.9 on 05/27/2020 -Continue iron supplement -Monitor closely after Eliquis initiated -Eliquis started 06/15/2020  Rectal cancer s/p robotic LAR resectrion 05/25/2020 -Follow-up with oncology  History of CVA (cerebrovascular accident) -Dual antiplatelet therapy on hold in setting of Eliquis  Hypothyroidism: Patient's not taking medications currently -TSH within normal range  CKD (chronic kidney disease), stage IV ([place])  Patient had creatinine 1.7 in February, but recently baseline creatinine is 2.13-2.78.   On admission creatinine 2.41, BUN 42 Patient may have established a baseline Monitor BMP daily Avoid nonessential nephrotoxins  Diet controlled Type II diabetes mellitus with renal manifestations [facility]): Recent A1c 5.1, well controlled.  Patient is not taking medications currently.  Depression -Zoloft   DVT prophylaxis: Eliquis Code Status: Partial Family Communication: Left VM for nephew Khairi Garman [phone] on 5/20 Disposition Plan: Status is: Inpatient  Remains inpatient appropriate because:Inpatient level of care appropriate due to severity of illness   Dispo: The patient is from: SNF  Anticipated d/c is to: SNF              Patient currently is not  medically stable to d/c.   Difficult to place patient No   Atrial fibrillation with rapid ventricular rate.  Started on long-acting Cardizem.  Still with occasional bursts of rapid atrial fibrillation.  Also started on Eliquis on 06/15/2020.  Would like to monitor for today and recheck hemoglobin in a.m.  If no further bleeding plan on discharge back to skilled nursing facility 06/18/2018          Level of care: Med-Surg  Consultants:   Cardiology-[facility]  Procedures:   None  Antimicrobials:   None   Subjective: Patient seen and examined.  History limited by underlying dementia and hearing loss.  No specific complaints.  Objective: Vitals:   06/15/20 2240 06/16/20 0428 06/16/20 0755 06/16/20 0955  BP:  131/88 136/60 105/78  Pulse: (!) 107 80 90 (!) 131  Resp: 19 18 16    Temp: 98.3 F (36.8 C) 97.7 F (36.5 C) 98 F (36.7 C)   TempSrc:  Oral    SpO2: 98% 97% 97%   Weight:      Height:        Intake/Output Summary (Last 24 hours) at 06/16/2020 1052 Last data filed at 06/16/2020 0940 Gross per 24 hour  Intake 720 ml  Output 1100 ml  Net -380 ml   Filed Weights   06/14/20 1202 06/15/20 0539  Weight: 113.4 kg 103.8 kg    Examination:  General exam: No acute distress.  Appears chronically ill Respiratory system: Poor respiratory effort.  Lungs clear.  Room air Cardiovascular system: S1-S2, regular rhythm, no murmurs Gastrointestinal system: Abdomen is nondistended, soft and nontender. No organomegaly or masses felt. Normal bowel sounds heard. Central nervous system: Alert, oriented x1, no focal deficits extremities: Symmetric 5 x 5 power. Skin: No rashes, lesions or ulcers Psychiatry: Judgement and insight appear impaired. Mood & affect flattened.     Data Reviewed: I have personally reviewed following labs and imaging studies  CBC: Recent Labs  Lab 06/14/20 1220 06/15/20 0344 06/16/20 0421  WBC 9.5 8.7 9.5  HGB 9.7* 8.9* 9.3*  HCT 31.2* 28.0* 29.5*   MCV 88.1 87.0 87.3  PLT 591* 466* 154*   Basic Metabolic Panel: Recent Labs  Lab 06/14/20 1220 06/15/20 0344 06/16/20 0730  NA 138 139 140  K 4.0 3.6 4.1  CL 102 107 107  CO2 24 23 24   GLUCOSE 174* 144* 129*  BUN 42* 40* 38*  CREATININE 2.41* 2.18* 2.17*  CALCIUM 9.3 8.7* 8.9  MG 2.5*  --  2.4   GFR: Estimated Creatinine Clearance: 40.7 mL/min (A) (by C-G formula based on SCr of 2.17 mg/dL (H)). Liver Function Tests: No results for input(s): AST, ALT, ALKPHOS, BILITOT, PROT, ALBUMIN in the last 168 hours. No results for input(s): LIPASE, AMYLASE in the last 168 hours. No results for input(s): AMMONIA in the last 168 hours. Coagulation Profile: Recent Labs  Lab 06/14/20 1220  INR 1.2   Cardiac Enzymes: No results for input(s): CKTOTAL, CKMB, CKMBINDEX, TROPONINI in the last 168 hours. BNP (last 3 results) No results for input(s): PROBNP in the last 8760 hours. HbA1C: Recent Labs    06/15/20 0344  HGBA1C 6.0*   CBG: Recent Labs  Lab 06/15/20 0807 06/16/20 0757  GLUCAP 157* 133*   Lipid Profile: Recent Labs    06/15/20 0344  CHOL 131  HDL 30*  LDLCALC 81  TRIG 100  CHOLHDL 4.4   Thyroid Function Tests: Recent Labs    06/15/20 0344  TSH 1.978   Anemia Panel: No results for input(s): VITAMINB12, FOLATE, FERRITIN, TIBC, IRON, RETICCTPCT in the last 72 hours. Sepsis Labs: No results for input(s): PROCALCITON, LATICACIDVEN in the last 168 hours.  Recent Results (from the past 240 hour(s))  Resp Panel by RT-PCR (Flu A&B, Covid) Nasopharyngeal Swab     Status: None   Collection Time: 06/14/20  2:40 PM   Specimen: Nasopharyngeal Swab; Nasopharyngeal(NP) swabs in vial transport medium  Result Value Ref Range Status   SARS Coronavirus 2 by RT PCR NEGATIVE NEGATIVE Final    Comment: (NOTE) SARS-CoV-2 target nucleic acids are NOT DETECTED.  The SARS-CoV-2 RNA is generally detectable in upper respiratory specimens during the acute phase of infection.  The lowest concentration of SARS-CoV-2 viral copies this assay can detect is 138 copies/mL. A negative result does not preclude SARS-Cov-2 infection and should not be used as the sole basis for treatment or other patient management decisions. A negative result may occur with  improper specimen collection/handling, submission of specimen other than nasopharyngeal swab, presence of viral mutation(s) within the areas targeted by this assay, and inadequate number of viral copies(<138 copies/mL). A negative result must be combined with clinical observations, patient history, and epidemiological information. The expected result is Negative.  Fact Sheet for Patients:  EntrepreneurPulse.com.au  Fact Sheet for Healthcare Providers:  IncredibleEmployment.be  This test is no t yet approved or cleared by the [place] FDA and  has been authorized for detection and/or diagnosis of SARS-CoV-2 by FDA under an Emergency Use Authorization (EUA). This EUA will remain  in effect (meaning this test can be used) for the duration of the COVID-19 declaration under Section 564(b)(1) of the Act, 21 U.S.C.section 360bbb-3(b)(1), unless the authorization is terminated  or revoked sooner.       Influenza A by PCR NEGATIVE NEGATIVE Final   Influenza B by PCR NEGATIVE NEGATIVE Final    Comment: (NOTE) The Xpert Xpress SARS-CoV-2/FLU/RSV plus assay is intended as an aid in the diagnosis of influenza from Nasopharyngeal swab specimens and should not be used as a sole basis for treatment. Nasal washings and aspirates are unacceptable for Xpert Xpress SARS-CoV-2/FLU/RSV testing.  Fact Sheet for Patients: EntrepreneurPulse.com.au  Fact Sheet for Healthcare Providers: IncredibleEmployment.be  This test is not yet approved or cleared by the [place] FDA and has been authorized for detection and/or diagnosis of SARS-CoV-2 by FDA  under an Emergency Use Authorization (EUA). This EUA will remain in effect (meaning this test can be used) for the duration of the COVID-19 declaration under Section 564(b)(1) of the Act, 21 U.S.C. section 360bbb-3(b)(1), unless the authorization is terminated or revoked.  Performed at [facility], [address] [place], [place] [address]          Radiology Studies: [facility] Chest [place] 1 View  Result Date: 06/14/2020 CLINICAL DATA:  Tachycardia. EXAM: PORTABLE CHEST 1 VIEW COMPARISON:  09/14/2013 FINDINGS: The right IJ power port is stable. The heart is within normal limits in size given the AP projection and portable technique. There is moderate tortuosity and mild calcification of the thoracic aorta. No acute pulmonary findings. No pleural effusion or pulmonary lesions. The bony thorax is intact. IMPRESSION: No acute cardiopulmonary findings. Electronically Signed   By: Marijo Sanes M.D.   On: 06/14/2020 13:28        Scheduled Meds: . apixaban  5 mg Oral  BID  . cholecalciferol  2,000 Units Oral Daily  . diltiazem  180 mg Oral Daily  . ferrous sulfate  325 mg Oral Daily  . finasteride  5 mg Oral Daily  . multivitamin with minerals  1 tablet Oral Daily  . polyethylene glycol  17 g Oral Daily  . sertraline  25 mg Oral QHS  . vitamin B-12  1,000 mcg Oral Daily   Continuous Infusions:   LOS: 1 day    Time spent: 25 minutes    Sidney Ace, MD [facility] Pager [phone]  If 7PM-7AM, please contact night-coverage 06/16/2020, 10:52 AM

## 2020-06-16 NOTE — Progress Notes (Addendum)
Called into room by tech, stating patient has blood in stool. Upon entering room patient does have soft brown/red stool currently small amount with small clot. Made dr. Priscella Mann aware. Per md probably his eliquis. Per md hold eliquis dose tonight, will recheck h&h in am and will decide if patient needs to continue or stop eliquis

## 2020-06-16 NOTE — Progress Notes (Signed)
Progress Note  Patient Name: Christopher Delacruz Date of Encounter: 06/16/2020  [facility] Cardiologist: None   Subjective   Denies chest pain or shortness of breath.  He is upset about needing to go to the bathroom and having an accident.  Inpatient Medications    Scheduled Meds: . apixaban  5 mg Oral BID  . cholecalciferol  2,000 Units Oral Daily  . diltiazem  180 mg Oral Daily  . ferrous sulfate  325 mg Oral Daily  . finasteride  5 mg Oral Daily  . multivitamin with minerals  1 tablet Oral Daily  . polyethylene glycol  17 g Oral Daily  . sertraline  25 mg Oral QHS  . vitamin B-12  1,000 mcg Oral Daily   Continuous Infusions:  PRN Meds: acetaminophen, hydrALAZINE, metoprolol tartrate, ondansetron (ZOFRAN) IV   Vital Signs    Vitals:   06/15/20 2240 06/16/20 0428 06/16/20 0755 06/16/20 0955  BP:  131/88 136/60 105/78  Pulse: (!) 107 80 90 (!) 131  Resp: 19 18 16    Temp: 98.3 F (36.8 C) 97.7 F (36.5 C) 98 F (36.7 C)   TempSrc:  Oral    SpO2: 98% 97% 97%   Weight:      Height:        Intake/Output Summary (Last 24 hours) at 06/16/2020 1136 Last data filed at 06/16/2020 0940 Gross per 24 hour  Intake 720 ml  Output 1100 ml  Net -380 ml   Last 3 Weights 06/15/2020 06/14/2020 05/29/2020  Weight (lbs) 228 lb 13.4 oz 250 lb 241 lb 6.5 oz  Weight (kg) 103.8 kg 113.399 kg 109.5 kg  Some encounter information is confidential and restricted. Go to Review Flowsheets activity to see all data.      Telemetry    Atrial flutter.  Rate 90s to 130s.- Personally Reviewed  ECG    N/A- Personally Reviewed  Physical Exam   VS:  BP 105/78   Pulse (!) 131   Temp 98 F (36.7 C)   Resp 16   Ht 6\' 3"  (1.905 m)   Wt 103.8 kg   SpO2 97%   BMI 28.60 kg/m  , BMI Body mass index is 28.6 kg/m. GENERAL: Chronically ill-appearing HEENT: Pupils equal round and reactive, fundi not visualized, oral mucosa unremarkable NECK:  No jugular venous distention, waveform within  normal limits, carotid upstroke brisk and symmetric, no bruits LUNGS:  Clear to auscultation bilaterally HEART: Tachycardic.  Irregularly irregular.Marland Kitchen  PMI not displaced or sustained,S1 and S2 within normal limits, no S3, no S4, no clicks, no rubs, no murmurs ABD:  Flat, positive bowel sounds normal in frequency in pitch, no bruits, no rebound, no guarding, no midline pulsatile mass, no hepatomegaly, no splenomegaly EXT:  2 plus pulses throughout, no edema, no cyanosis no clubbing SKIN:  No rashes no nodules NEURO:  Cranial nerves II through XII grossly intact, motor grossly intact throughout PSYCH: Unable to assess orientation   Labs    High Sensitivity Troponin:   Recent Labs  Lab 06/14/20 1220 06/14/20 1804 06/14/20 2051  TROPONINIHS 19* 19* 18*      Chemistry Recent Labs  Lab 06/14/20 1220 06/15/20 0344 06/16/20 0730  NA 138 139 140  K 4.0 3.6 4.1  CL 102 107 107  CO2 24 23 24   GLUCOSE 174* 144* 129*  BUN 42* 40* 38*  CREATININE 2.41* 2.18* 2.17*  CALCIUM 9.3 8.7* 8.9  GFRNONAA 28* 32* 32*  ANIONGAP 12 9 9  Hematology Recent Labs  Lab 06/14/20 1220 06/15/20 0344 06/16/20 0421  WBC 9.5 8.7 9.5  RBC 3.54* 3.22* 3.38*  HGB 9.7* 8.9* 9.3*  HCT 31.2* 28.0* 29.5*  MCV 88.1 87.0 87.3  MCH 27.4 27.6 27.5  MCHC 31.1 31.8 31.5  RDW 13.2 13.2 13.5  PLT 591* 466* 413*    BNPNo results for input(s): BNP, PROBNP in the last 168 hours.   DDimer No results for input(s): DDIMER in the last 168 hours.   Radiology    DG Chest Port 1 View  Result Date: 06/14/2020 CLINICAL DATA:  Tachycardia. EXAM: PORTABLE CHEST 1 VIEW COMPARISON:  09/14/2013 FINDINGS: The right IJ power port is stable. The heart is within normal limits in size given the AP projection and portable technique. There is moderate tortuosity and mild calcification of the thoracic aorta. No acute pulmonary findings. No pleural effusion or pulmonary lesions. The bony thorax is intact. IMPRESSION: No acute  cardiopulmonary findings. Electronically Signed   By: Marijo Sanes M.D.   On: 06/14/2020 13:28    Cardiac Studies   Echo 03/22/18: 1. The left ventricle has normal systolic function, with an ejection  fraction of 55-60%. The cavity size was normal. There is mildly increased  left ventricular wall thickness. Left ventricular diastolic Doppler  parameters are consistent with impaired  relaxation.  2. The right ventricle has normal systolic function. The cavity was  normal. There is no increase in right ventricular wall thickness.  3. The mitral valve is normal in structure. Mild thickening of the mitral  valve leaflet.  4. The tricuspid valve is normal in structure.  5. The aortic valve is normal in structure. Moderate thickening of the  aortic valve Aortic valve regurgitation is mild by color flow Doppler.   Patient Profile     70 y.o. male with hypertension, hyperlipidemia, diabetes, prior stroke with right-sided weakness and aphasia, CKD 4, rectal cancer status post robotic resection/29/22, dementia, and cognitive impairment admitted from his nursing home with new onset atrial fibrillation/flutter.    Assessment & Plan    # Atrial flutter:  Mr. Jeziorski remains in atrial flutter.  His rates are poorly controlled.  His blood pressure is a little soft.  Continue diltiazem.  We will add metoprolol tartrate 25 mg twice daily.  If this does not help or his blood pressure will not allow, we will need to add digoxin.  He was started on Eliquis this admission.  Plans for outpatient cardioversion in 3 weeks if his systolic function is normal.  If his echo reduced reveals reduced systolic function we will get a TEE and cardioversion this admission.  #Prior stroke: # Hyperlipidemia: Home aspirin and Plavix were discontinued so that he could be started on Eliquis.  Given his prior stroke his LDL should be less than 70.  It was 81 this admission.  We will start rosuvastatin 10 mg daily.  He will  need repeat lipids and CMP in 2 to 3 months.  #Hypertension: His home amlodipine and HCTZ are on hold so that we could add rate control agents.  Continue diltiazem and adding metoprolol as above.          For questions or updates, please contact [place] Please consult [email or web address] for contact info under        Signed, Skeet Latch, MD  06/16/2020, 11:36 AM

## 2020-06-16 NOTE — Progress Notes (Signed)
   06/16/20 0955  Assess: MEWS Score  BP 105/78  Pulse Rate (!) 131  Assess: MEWS Score  MEWS Temp 0  MEWS Systolic 0  MEWS Pulse 3  MEWS RR 0  MEWS LOC 0  MEWS Score 3  MEWS Score Color Yellow  Assess: if the MEWS score is Yellow or Red  Were vital signs taken at a resting state? Yes  Focused Assessment Change from prior assessment (see assessment flowsheet)  Early Detection of Sepsis Score *See Row Information* Low  MEWS guidelines implemented *See Row Information* Yes  Treat  MEWS Interventions Escalated (See documentation below)  Take Vital Signs  Increase Vital Sign Frequency  Yellow: Q 2hr X 2 then Q 4hr X 2, if remains yellow, continue Q 4hrs  Escalate  MEWS: Escalate Yellow: discuss with charge nurse/RN and consider discussing with provider and RRT  Notify: Charge Nurse/RN  Name of Charge Nurse/RN Notified [email or web address]  Date Charge Nurse/RN Notified 06/16/20  Time Charge Nurse/RN Notified 1157  Document  Patient Outcome Stabilized after interventions  Progress note created (see row info) Yes  heart rate improved to 90's after prn metoprolol given

## 2020-06-16 NOTE — Care Management (Signed)
Received notification from bedside RN.  Patient had blood in stool with small amount of clot.  Suspect secondary to Eliquis in setting of rectal CA and recent robotic surgery.  Currently HD stable  Plan: Hold PM dose of Eliquis Check AM CBC Will need further consideration for risk/benefit of systemic anticoagulation  Ralene Muskrat MD

## 2020-06-16 NOTE — Evaluation (Signed)
Occupational Therapy Evaluation Patient Details Name: Christopher Delacruz MRN: [id number] DOB: 1950-12-29 Today's Date: 06/16/2020    History of Present Illness Christopher Delacruz is a 63yoM who comes to [facility] on 5/19 from STR at [facility], where he has been for 2 weeks. Pt at baseline resides at [facility]. Comes in via [facility] for tachycardia. 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 Pt admtted with AF/RVR.   Clinical Impression   Pt seen for OT evaluation this date in setting of acute hospitalization d/t AF/RVR. Pt presents this date fatigued and somewhat self-limiting with attempts to assess mobility. Pt agreeable to strength assessment and bed level ADLs. Pt reports that he was able to walk with walker, but also provides conflicting details about when this was. In addition, unclear how much help and for what ADLs, pt received assistance at ALF prior to recent STR stay. On assessment, pt noted to be impacted by decreased fxl activity tolerance and general weakness/deconditioning. Pt with limited R UE ROM 2/2 CVA. Pt requires SETUP to MIN A for UB ADLs and MAX A to TOTAL A for LB ADLs at bed level at this time. Will continue to follow acutely. Anticipate pt will require resumption of OT services in STR setting prior to being able to return to ALF setting considering his current weakness and low tolerance.     Follow Up Recommendations  SNF    Equipment Recommendations  3 in 1 bedside commode    Recommendations for Other Services       Precautions / Restrictions Precautions Precautions: Fall Restrictions Weight Bearing Restrictions: No      Mobility Bed Mobility               General bed mobility comments: pt not agreeable to attempting to sit on side of bed.    Transfers                 General transfer comment: deferred    Balance                                            ADL either performed or assessed with clinical judgement   ADL Overall ADL's : Needs assistance/impaired                                       General ADL Comments: SETUP to MIN A for UB ADLs bed level (high fowler's position), MAX A for LB ADLs bed level. Pt self-limiting with attempts to contribute to self care ADLs.     Vision Patient Visual Report: No change from baseline       Perception     Praxis      Pertinent Vitals/Pain Pain Assessment: No/denies pain     Hand Dominance Right   Extremity/Trunk Assessment Upper Extremity Assessment Upper Extremity Assessment: Generalized weakness;LUE deficits/detail;RUE deficits/detail RUE Deficits / Details: shld flexion to ~1/2 range RUE Coordination: decreased fine motor;decreased gross motor LUE Deficits / Details: shld flexion to ~2/3 range LUE Coordination: decreased fine motor   Lower Extremity Assessment Lower Extremity Assessment: Defer to PT evaluation;Generalized weakness       Communication Communication Communication: Expressive difficulties   Cognition Arousal/Alertness: Awake/alert  Behavior During Therapy: WFL for tasks assessed/performed Overall Cognitive Status: No family/caregiver present to determine baseline cognitive functioning                                 General Comments: pt able to follow all simple one step commands, diffiulty wtih multi step. Pt only oriented to self. Unable to guess correct place or month even when only given two options. Pt provides conflucting information about PLOF.   General Comments       Exercises Other Exercises Other Exercises: OT educates re: role of OT, pt with limited engagement in education, diffiulty to assess carryover, but he does visually attend.   Shoulder Instructions      Home Living Family/patient expects to be discharged to:: Skilled nursing facility (at [facility]; resides at  ALF at [facility])   Available Help at Discharge: Available 24 hours/day;Other (Comment) Type of Home: Assisted living Home Access: Level entry     Home Layout: One level     Bathroom Shower/Tub: Occupational psychologist: Handicapped height Bathroom Accessibility: Yes   Home Equipment: [place] - single point;Walker - 4 wheels   Additional Comments: Pt able to confirm most of info with moderate expressive aphasia      Prior Functioning/Environment Level of Independence: Needs assistance  Gait / Transfers Assistance Needed: Independent for short distances. 1 falls in last year trying to get either in or out of bed without injury. Refuses to use walker.  Greeted at [employer] appointments with wheelchair. ADL's / Homemaking Assistance Needed: Per nephew, pt refuses assistance for ADLs from CNAs at ALF but will ask nephew for help with donning shoes and belt before leaving for appointments. Pt performs his basic ADLs on his own. Communication / Swallowing Assistance Needed: dysarthric Comments: Varying responses, no family present to clarify. Most information obtained from previous therapy evaluation information collected.        OT Problem List: Decreased strength;Decreased coordination;Pain;Decreased cognition;Decreased range of motion;Decreased activity tolerance;Decreased safety awareness;Decreased knowledge of use of DME or AE;Impaired balance (sitting and/or standing);Decreased knowledge of precautions      OT Treatment/Interventions: Self-care/ADL training;Therapeutic exercise;Therapeutic activities;Cognitive remediation/compensation;DME and/or AE instruction;Patient/family education;Balance training    OT Goals(Current goals can be found in the care plan section) Acute Rehab OT Goals Patient Stated Goal: regain baseine level of independence to return to ALF OT Goal Formulation: With patient Time For Goal Achievement: 06/30/20 Potential to Achieve Goals:  Fair ADL Goals Pt Will Transfer to Toilet: with min assist;with mod assist;stand pivot transfer;bedside commode Pt Will Perform Toileting - Clothing Manipulation and hygiene: with min assist;sit to/from stand Pt/caregiver will Perform Home Exercise Program: Increased ROM;Both right and left upper extremity;With minimal assist  OT Frequency: Min 2X/week   Barriers to D/C:            Co-evaluation              AM-PAC OT "6 Clicks" Daily Activity     Outcome Measure Help from another person eating meals?: A Little Help from another person taking care of personal grooming?: A Little Help from another person toileting, which includes using toliet, bedpan, or urinal?: Total Help from another person bathing (including washing, rinsing, drying)?: A Lot Help from another person to put on and taking off regular upper body clothing?: A Lot Help from another person to put on and taking off regular lower body  clothing?: Total 6 Click Score: 12   End of Session Nurse Communication: Mobility status  Activity Tolerance: Other (comment) (self-limiting) Patient left: in bed;with call bell/phone within reach;with bed alarm set;with SCD's reapplied  OT Visit Diagnosis: Unsteadiness on feet (R26.81);Other symptoms and signs involving cognitive function;History of falling (Z91.81);Muscle weakness (generalized) (M62.81);Hemiplegia and hemiparesis;Pain Hemiplegia - Right/Left: Right Hemiplegia - dominant/non-dominant: Dominant                Time: 8182-9937 OT Time Calculation (min): 11 min Charges:  OT General Charges $OT Visit: 1 Visit OT Evaluation $OT Eval Low Complexity: [place], [place], OTR/L ascom [phone] 06/16/20, 1:03 PM

## 2020-06-16 NOTE — Progress Notes (Incomplete)
Progress Note  Patient Name: Christopher Delacruz Date of Encounter: 06/16/2020  [facility] Cardiologist: None ***  Subjective   In aflutter with heart rates now around 100. On dilt and eliquis, Echo read pending.  Inpatient Medications    Scheduled Meds: . apixaban  5 mg Oral BID  . cholecalciferol  2,000 Units Oral Daily  . diltiazem  180 mg Oral Daily  . ferrous sulfate  325 mg Oral Daily  . finasteride  5 mg Oral Daily  . multivitamin with minerals  1 tablet Oral Daily  . polyethylene glycol  17 g Oral Daily  . sertraline  25 mg Oral QHS  . vitamin B-12  1,000 mcg Oral Daily   Continuous Infusions:  PRN Meds: acetaminophen, hydrALAZINE, metoprolol tartrate, ondansetron (ZOFRAN) IV   Vital Signs    Vitals:   06/15/20 2240 06/16/20 0428 06/16/20 0755 06/16/20 0955  BP:  131/88 136/60 105/78  Pulse: (!) 107 80 90 (!) 131  Resp: 19 18 16    Temp: 98.3 F (36.8 C) 97.7 F (36.5 C) 98 F (36.7 C)   TempSrc:  Oral    SpO2: 98% 97% 97%   Weight:      Height:        Intake/Output Summary (Last 24 hours) at 06/16/2020 1116 Last data filed at 06/16/2020 0940 Gross per 24 hour  Intake 720 ml  Output 1100 ml  Net -380 ml   Last 3 Weights 06/15/2020 06/14/2020 05/29/2020  Weight (lbs) 228 lb 13.4 oz 250 lb 241 lb 6.5 oz  Weight (kg) 103.8 kg 113.399 kg 109.5 kg  Some encounter information is confidential and restricted. Go to Review Flowsheets activity to see all data.      Telemetry    *** - Personally Reviewed  ECG    *** - Personally Reviewed  Physical Exam  *** GEN: No acute distress.   Neck: No JVD Cardiac: RRR, no murmurs, rubs, or gallops.  Respiratory: Clear to auscultation bilaterally. GI: Soft, nontender, non-distended  MS: No edema; No deformity. Neuro:  Nonfocal  Psych: Normal affect   Labs    High Sensitivity Troponin:   Recent Labs  Lab 06/14/20 1220 06/14/20 1804 06/14/20 2051  TROPONINIHS 19* 19* 18*      Chemistry Recent Labs   Lab 06/14/20 1220 06/15/20 0344 06/16/20 0730  NA 138 139 140  K 4.0 3.6 4.1  CL 102 107 107  CO2 24 23 24   GLUCOSE 174* 144* 129*  BUN 42* 40* 38*  CREATININE 2.41* 2.18* 2.17*  CALCIUM 9.3 8.7* 8.9  GFRNONAA 28* 32* 32*  ANIONGAP 12 9 9      Hematology Recent Labs  Lab 06/14/20 1220 06/15/20 0344 06/16/20 0421  WBC 9.5 8.7 9.5  RBC 3.54* 3.22* 3.38*  HGB 9.7* 8.9* 9.3*  HCT 31.2* 28.0* 29.5*  MCV 88.1 87.0 87.3  MCH 27.4 27.6 27.5  MCHC 31.1 31.8 31.5  RDW 13.2 13.2 13.5  PLT 591* 466* 413*    BNPNo results for input(s): BNP, PROBNP in the last 168 hours.   DDimer No results for input(s): DDIMER in the last 168 hours.   Radiology    DG Chest Port 1 View  Result Date: 06/14/2020 CLINICAL DATA:  Tachycardia. EXAM: PORTABLE CHEST 1 VIEW COMPARISON:  09/14/2013 FINDINGS: The right IJ power port is stable. The heart is within normal limits in size given the AP projection and portable technique. There is moderate tortuosity and mild calcification of the thoracic aorta. No  acute pulmonary findings. No pleural effusion or pulmonary lesions. The bony thorax is intact. IMPRESSION: No acute cardiopulmonary findings. Electronically Signed   By: Marijo Sanes M.D.   On: 06/14/2020 13:28    Cardiac Studies   ***  Patient Profile     70 y.o. male ***  Assessment & Plan    *** {Are we signing off today?:[id number]}  For questions or updates, please contact [place] Please consult [email or web address] for contact info under        Signed, Dearion Huot Ninfa Meeker, PA-C  06/16/2020, 11:16 AM

## 2020-06-17 DIAGNOSIS — I4891 Unspecified atrial fibrillation: Secondary | ICD-10-CM | POA: Diagnosis not present

## 2020-06-17 DIAGNOSIS — R778 Other specified abnormalities of plasma proteins: Secondary | ICD-10-CM | POA: Diagnosis not present

## 2020-06-17 DIAGNOSIS — Z8673 Personal history of transient ischemic attack (TIA), and cerebral infarction without residual deficits: Secondary | ICD-10-CM | POA: Diagnosis not present

## 2020-06-17 DIAGNOSIS — K922 Gastrointestinal hemorrhage, unspecified: Secondary | ICD-10-CM | POA: Diagnosis not present

## 2020-06-17 LAB — BASIC METABOLIC PANEL
Anion gap: 8 (ref 5–15)
BUN: 38 mg/dL — ABNORMAL HIGH (ref 8–23)
CO2: 25 mmol/L (ref 22–32)
Calcium: 8.8 mg/dL — ABNORMAL LOW (ref 8.9–10.3)
Chloride: 105 mmol/L (ref 98–111)
Creatinine, Ser: 2.26 mg/dL — ABNORMAL HIGH (ref 0.61–1.24)
GFR, Estimated: 30 mL/min — ABNORMAL LOW (ref >60)
Glucose, Bld: 160 mg/dL — ABNORMAL HIGH (ref 70–99)
Potassium: 4.1 mmol/L (ref 3.5–5.1)
Sodium: 138 mmol/L (ref 135–145)

## 2020-06-17 LAB — ECHOCARDIOGRAM COMPLETE
AR max vel: 2.59 cm2
AV Area VTI: 3.4 cm2
AV Area mean vel: 2.54 cm2
AV Mean grad: 2 mmHg
AV Peak grad: 4.3 mmHg
Ao pk vel: 1.04 m/s
Area-P 1/2: 7.44 cm2
Height: 75 in
S' Lateral: 3.62 cm
Weight: 3661.4 oz

## 2020-06-17 LAB — CBC WITH DIFFERENTIAL/PLATELET
Abs Immature Granulocytes: 0.06 10*3/uL (ref 0.00–0.07)
Basophils Absolute: 0.1 10*3/uL (ref 0.0–0.1)
Basophils Relative: 0 %
Eosinophils Absolute: 0.3 10*3/uL (ref 0.0–0.5)
Eosinophils Relative: 2 %
HCT: 26.2 % — ABNORMAL LOW (ref 39.0–52.0)
Hemoglobin: 8.6 g/dL — ABNORMAL LOW (ref 13.0–17.0)
Immature Granulocytes: 1 %
Lymphocytes Relative: 7 %
Lymphs Abs: 0.8 10*3/uL (ref 0.7–4.0)
MCH: 28.5 pg (ref 26.0–34.0)
MCHC: 32.8 g/dL (ref 30.0–36.0)
MCV: 86.8 fL (ref 80.0–100.0)
Monocytes Absolute: 1.2 10*3/uL — ABNORMAL HIGH (ref 0.1–1.0)
Monocytes Relative: 10 %
Neutro Abs: 9.6 10*3/uL — ABNORMAL HIGH (ref 1.7–7.7)
Neutrophils Relative %: 80 %
Platelets: 397 10*3/uL (ref 150–400)
RBC: 3.02 MIL/uL — ABNORMAL LOW (ref 4.22–5.81)
RDW: 13.4 % (ref 11.5–15.5)
WBC: 12 10*3/uL — ABNORMAL HIGH (ref 4.0–10.5)
nRBC: 0 % (ref 0.0–0.2)

## 2020-06-17 LAB — PREPARE RBC (CROSSMATCH)

## 2020-06-17 LAB — HEMOGLOBIN
Hemoglobin: 6.7 g/dL — ABNORMAL LOW (ref 13.0–17.0)
Hemoglobin: 7.3 g/dL — ABNORMAL LOW (ref 13.0–17.0)
Hemoglobin: 8.1 g/dL — ABNORMAL LOW (ref 13.0–17.0)

## 2020-06-17 LAB — GLUCOSE, CAPILLARY: Glucose-Capillary: 174 mg/dL — ABNORMAL HIGH (ref 70–99)

## 2020-06-17 LAB — MAGNESIUM: Magnesium: 2.3 mg/dL (ref 1.7–2.4)

## 2020-06-17 MED ORDER — SODIUM CHLORIDE 0.9% IV SOLUTION: Freq: Once | INTRAVENOUS | Status: AC

## 2020-06-17 MED ORDER — PANTOPRAZOLE SODIUM 40 MG IV SOLR
40.0000 mg | Freq: Two times a day (BID) | INTRAVENOUS | Status: DC
  Administered 2020-06-17 – 2020-06-23 (×14): 40 mg via INTRAVENOUS
  Filled 2020-06-17 (×15): qty 40

## 2020-06-17 MED ORDER — AMIODARONE HCL IN DEXTROSE 360-4.14 MG/200ML-% IV SOLN
30.0000 mg/h | INTRAVENOUS | Status: DC
  Administered 2020-06-17 – 2020-06-19 (×4): 30 mg/h via INTRAVENOUS
  Filled 2020-06-17 (×3): qty 200

## 2020-06-17 MED ORDER — AMIODARONE HCL IN DEXTROSE 360-4.14 MG/200ML-% IV SOLN
60.0000 mg/h | INTRAVENOUS | Status: AC
  Administered 2020-06-17 (×2): 60 mg/h via INTRAVENOUS
  Filled 2020-06-17 (×2): qty 200

## 2020-06-17 MED ORDER — ALBUMIN HUMAN 25 % IV SOLN
25.0000 g | Freq: Once | INTRAVENOUS | Status: AC
  Administered 2020-06-17: 25 g via INTRAVENOUS
  Filled 2020-06-17: qty 100

## 2020-06-17 MED ORDER — SODIUM CHLORIDE 0.9 % IV BOLUS
500.0000 mL | Freq: Once | INTRAVENOUS | Status: AC
  Administered 2020-06-17: 500 mL via INTRAVENOUS

## 2020-06-17 MED ORDER — AMIODARONE LOAD VIA INFUSION
150.0000 mg | Freq: Once | INTRAVENOUS | Status: AC
  Administered 2020-06-17: 150 mg via INTRAVENOUS
  Filled 2020-06-17: qty 83.34

## 2020-06-17 NOTE — Progress Notes (Signed)
5 mg IV metoprolol given for HR sustaining above 130

## 2020-06-17 NOTE — TOC Progression Note (Signed)
Transition of Care [facility]) - Progression Note    Patient Details  Name: Sadik Piascik MRN: [id number] Date of Birth: 03/15/1950  Transition of Care [facility]) CM/SW Contact  Izola Price, RN Phone Number: 06/17/2020, 4:13 PM  Clinical Narrative:  5/22 Patient having brb stools over weekend. Did not discharge. To return to [facility] when stable. Simmie Davies RN CM    Expected Discharge Plan: Skilled Nursing Facility Barriers to Discharge: Continued Medical Work up  Expected Discharge Plan and Services Expected Discharge Plan: [place] In-house Referral: Clinical Social Work   Post Acute Care Choice: [place] Living arrangements for the past 2 months: [place]                                       Social Determinants of Health (SDOH) Interventions    Readmission Risk Interventions Readmission Risk Prevention Plan 05/28/2020  Transportation Screening Complete  HRI or Home Care Consult Complete  Social Work Consult for [place] Planning/Counseling Complete  Palliative Care Screening Not Applicable  Medication Review Press photographer) Complete  Some recent data might be hidden

## 2020-06-17 NOTE — Progress Notes (Addendum)
Progress Note  Patient Name: Christopher Delacruz Date of Encounter: 06/17/2020  [facility] Cardiologist: [facility]  Subjective   Bloody stools overnight. Eliquis held. Bloody clots this AM on bed pad, stop Eliquis. Hgb this AM 9.3>8.6. He is in aflutter with elevated rates up to 130s. Metoprolol not started due to ?soft pressures. Bps soft this AM. He denies pain, SOB, palpitations.   Inpatient Medications    Scheduled Meds: . apixaban  5 mg Oral BID  . cholecalciferol  2,000 Units Oral Daily  . diltiazem  180 mg Oral Daily  . ferrous sulfate  325 mg Oral Daily  . finasteride  5 mg Oral Daily  . multivitamin with minerals  1 tablet Oral Daily  . polyethylene glycol  17 g Oral Daily  . sertraline  25 mg Oral QHS  . vitamin B-12  1,000 mcg Oral Daily   Continuous Infusions:  PRN Meds: acetaminophen, hydrALAZINE, metoprolol tartrate, ondansetron (ZOFRAN) IV   Vital Signs    Vitals:   06/16/20 2025 06/17/20 0100 06/17/20 0304 06/17/20 0500  BP: 131/72 110/60 (!) 106/48 (!) 110/52  Pulse: 68 (!) 102 (!) 134 99  Resp: 18  17   Temp: 98.1 F (36.7 C) 98.3 F (36.8 C) 98.3 F (36.8 C) 98.4 F (36.9 C)  TempSrc: Oral Oral    SpO2: 96% 98% 95% 96%  Weight:      Height:        Intake/Output Summary (Last 24 hours) at 06/17/2020 0721 Last data filed at 06/17/2020 0300 Gross per 24 hour  Intake 750 ml  Output 600 ml  Net 150 ml   Last 3 Weights 06/15/2020 06/14/2020 05/29/2020  Weight (lbs) 228 lb 13.4 oz 250 lb 241 lb 6.5 oz  Weight (kg) 103.8 kg 113.399 kg 109.5 kg  Some encounter information is confidential and restricted. Go to Review Flowsheets activity to see all data.      Telemetry    Aflutter HR 100-130 - Personally Reviewed  ECG    No new - Personally Reviewed  Physical Exam   GEN: No acute distress.   Neck: No JVD Cardiac: Irreg Irreg, tachy, no murmurs, rubs, or gallops.  Respiratory: Clear to auscultation bilaterally. XN:ATFTD clots from rectum MS: No  edema; No deformity. Neuro:  Nonfocal  Psych: Normal affect   Labs    High Sensitivity Troponin:   Recent Labs  Lab 06/14/20 1220 06/14/20 1804 06/14/20 2051  TROPONINIHS 19* 19* 18*      Chemistry Recent Labs  Lab 06/15/20 0344 06/16/20 0730 06/17/20 0155  NA 139 140 138  K 3.6 4.1 4.1  CL 107 107 105  CO2 23 24 25   GLUCOSE 144* 129* 160*  BUN 40* 38* 38*  CREATININE 2.18* 2.17* 2.26*  CALCIUM 8.7* 8.9 8.8*  GFRNONAA 32* 32* 30*  ANIONGAP 9 9 8      Hematology Recent Labs  Lab 06/15/20 0344 06/16/20 0421 06/17/20 0155  WBC 8.7 9.5 12.0*  RBC 3.22* 3.38* 3.02*  HGB 8.9* 9.3* 8.6*  HCT 28.0* 29.5* 26.2*  MCV 87.0 87.3 86.8  MCH 27.6 27.5 28.5  MCHC 31.8 31.5 32.8  RDW 13.2 13.5 13.4  PLT 466* 413* 397    BNPNo results for input(s): BNP, PROBNP in the last 168 hours.   DDimer No results for input(s): DDIMER in the last 168 hours.   Radiology    No results found.  Cardiac Studies   Echo 03/22/18: 1. The left ventricle has normal systolic function,  with an ejection  fraction of 55-60%. The cavity size was normal. There is mildly increased  left ventricular wall thickness. Left ventricular diastolic Doppler  parameters are consistent with impaired  relaxation.  2. The right ventricle has normal systolic function. The cavity was  normal. There is no increase in right ventricular wall thickness.  3. The mitral valve is normal in structure. Mild thickening of the mitral  valve leaflet.  4. The tricuspid valve is normal in structure.  5. The aortic valve is normal in structure. Moderate thickening of the  aortic valve Aortic valve regurgitation is mild by color flow Doppler.    Patient Profile     70 y.o. male with HTN, HLd, DM2, prior stroke with right-sided weakness and aphasia, CKD 4, rectal cancer s/p robotic resection 05/25/20, dementia with cognitive impairment admitted from a nursing home with new onset afib/flutter  Assessment & Plan     Aflutter - rates today elevated to 130s - metoprolol 25mg  BID recommended however not started. Pressure soft this AM. Start metoprolol if pressures can tolerate - Eliquis stopped as above for bloody stools, discontinue eliquis - Echo showed LVEF 55-60%, impaired relaxation - plan was for OP cardioversio in 3 weeks, however if cannot tolerate a/c continue rate/rhythm with medical management. Can consider ablation. - consider digoxin, however unsure he is good candidate given CKD  H/o CVA - Home ASA and plavix held for Eliquis - Eliquis held as above  HLD - LDL goal<79 -started on statin  HTN - home amlodipine and HCTZ held for rate controlling meds - continue dilt - start metoprolol 25mg  BID if pressures tolerate  Acute blood loss Iron deficiency anemia Rectal cancer s/p LAR resection 05/25/20 - bloody clots per rectum overnight and this AM - HGB 8.6 today, Eliquis held. Follow-up CBC pending - monitor CBC   For questions or updates, please contact [place] Please consult [email or web address] for contact info under        Signed, Raveen Wieseler Ninfa Meeker, PA-C  06/17/2020, 7:21 AM

## 2020-06-17 NOTE — Progress Notes (Signed)
Patient has had 2 large loose maroon colored stool with clots over the last hour.  Eliquis held earlier in the night.  Sharion Settler NP notified.  No new orders at this time.

## 2020-06-17 NOTE — Progress Notes (Signed)
PT Cancellation Note  Patient Details Name: Christopher Delacruz MRN: [id number] DOB: 1950/03/04   Cancelled Treatment:    Reason Eval/Treat Not Completed: Medical issues which prohibited therapy (Chart reviewed, treatment attempted. Per telemetry monitor, pt with sustained HR 136-138bpm. Will defer therapy session to later date/time once better HR control has been achievd.)   Jessyca Sloan C 06/17/2020, 9:51 AM

## 2020-06-17 NOTE — Progress Notes (Signed)
PROGRESS NOTE    Christopher Delacruz  MWN:[id number] DOB: Feb 24, 1950 DOA: 06/14/2020 PCP: Christopher Pitch, MD   Brief Narrative:   70 y.o. male with medical history significant of HTN, HLD, DM, stroke with right-sided weakness and aphasia, CKD-IV, anemia, BPH, AVR, rectal CA (s/p of robotic LAR resection 05/25/20), hypothyroidism, depression with anxiety, cognitive impairment, dementia, who presents with tachycardia.  Patient has history of dementia and aphasia secondary to previous stroke, and appears to be very poor historian and is unable to provide accurate medical history, therefore, most of the history is obtained by discussing the case with ED physician, per EMS report, and with the nursing staff.  Per report, pt was found to have tachycardia with in SNF. Per EDP, pt was found to new onset A fib with RVR with HR up to 140s in ED. Pt was given 15 mg of Cardizem IV and started Cardizem drip.  Patient converted to sinus rhythm.  Discussed with cardiology.  Converting to p.o. Cardizem long acting and starting Eliquis.  Patient has a complex urologic and oncologic history.  As such he is a high risk for complications and will need close follow-up.  Overall rate controlled with occasional bursts of rapid atrial fibrillation.  Control beta-blocker.  5/22: Rate control remains challenging.  On 5/21 patient had episode of stool with maroon-colored blood and clots.  On 5/22 patient had large-volume maroon stool with clots passed per rectum.  Notified cardiology.  Eliquis has been discontinued.  Serial hemoglobins.    Assessment & Plan:   Principal Problem:   Atrial fibrillation with RVR (HCC) Active Problems:   HTN (hypertension)   Benign prostatic hyperplasia without lower urinary tract symptoms   Iron deficiency anemia due to chronic blood loss   Rectal cancer s/p robotic LAR resectrion 05/25/2020   History of CVA (cerebrovascular accident)   Hypothyroidism   CKD (chronic kidney disease),  stage IV (HCC)   Type II diabetes mellitus with renal manifestations (HCC)   Depression   Elevated troponin   Atrial fibrillation with rapid ventricular response (HCC)  Rectal bleeding Patient passed maroon-colored stool with clots, starting 5/21 Suspect this is secondary to Eliquis Likely not a candidate for endoscopic intervention given multiple comorbidities, recent surgery, poor rate control Plan: DC Eliquis Likely not safe to restart this medication Protonix 40 mg IV twice daily Every 6 hour hemoglobin Transfuse PRBC as needed hemoglobin less than 7  Atrial fibrillation/Aflutter with RVR and elevated troponin Started on Cardizem gtt. in ED Rate controlled converted to sinus rhythm Cardiology consulted Rate control remains challenging TTE pending Plan: Continue Cardizem CD 180 mg daily As needed metoprolol pushes for sustained tachycardia Hold amlodipine and hydrochlorothiazide to to allow room for Cardizem Monitor electrolytes, K greater than 4, mag greater than 2 Telemetry monitoring Therapy evaluations, out of bed to chair Follow any further cardiac recommendations Patient likely not a candidate for continued anticoagulation given rectal bleeding  Rectal cancer s/p robotic LAR resectrion 05/25/2020 -Follow-up with oncology  HTN (hypertension) Amlodipine hydrochlorothiazide on hold Cardizem CD 180 mg daily  Benign prostatic hyperplasia without lower urinary tract symptoms -Proscar  Iron deficiency anemia due to chronic blood loss Acute on chronic blood loss anemia Patient had rectal bleeding starting 5/21 Likely secondary to Eliquis Plan: Twice daily PPI Trend hemoglobin Transfuse as needed Hb less than 8 DC Eliquis Continue iron supplementation  History of CVA (cerebrovascular accident) -Continue holding dual antiplatelet therapy  Hypothyroidism: Patient's not taking medications currently -TSH within normal range  CKD (chronic kidney disease),  stage IV [facility])  Patient had creatinine 1.7 in February, but recently baseline creatinine is 2.13-2.78.   On admission creatinine 2.41, BUN 42 Patient may have established a baseline Monitor BMP daily Avoid nonessential nephrotoxins  Diet controlled Type II diabetes mellitus with renal manifestations [facility]): Recent A1c 5.1, well controlled.  Patient is not taking medications currently.  Depression -Zoloft   DVT prophylaxis: SCD Code Status: DNR.  Discussed and confirmed with patient's nephew Family Communication:  nephew Christopher Delacruz [phone] on 5/22 Disposition Plan: Status is: Inpatient  Remains inpatient appropriate because:Inpatient level of care appropriate due to severity of illness   Dispo: The patient is from: SNF              Anticipated d/c is to: SNF              Patient currently is not medically stable to d/c.   Difficult to place patient No    Atrial fibrillation with rapid ventricular rate.  Started on long-acting Cardizem.  Still with occasional bursts of rapid atrial fibrillation.  Also started on Eliquis on 06/15/2020.  Would like to monitor for today and recheck hemoglobin in a.m.  If no further bleeding plan on discharge back to skilled nursing facility 06/18/2018    Level of care: Med-Surg  Consultants:   Cardiology-[facility]  Procedures:   None  Antimicrobials:   None   Subjective: Patient seen and examined.  History limited by underlying dementia and hearing loss.   Objective: Vitals:   06/17/20 0100 06/17/20 0304 06/17/20 0500 06/17/20 0730  BP: 110/60 (!) 106/48 (!) 110/52 (!) 103/59  Pulse: (!) 102 (!) 134 99 (!) 109  Resp:  17    Temp: 98.3 F (36.8 C) 98.3 F (36.8 C) 98.4 F (36.9 C) 98.5 F (36.9 C)  TempSrc: Oral     SpO2: 98% 95% 96% 99%  Weight:      Height:        Intake/Output Summary (Last 24 hours) at 06/17/2020 1032 Last data filed at 06/17/2020 0945 Gross per 24 hour  Intake 710 ml  Output 600 ml  Net 110 ml    Filed Weights   06/14/20 1202 06/15/20 0539  Weight: 113.4 kg 103.8 kg    Examination:  General exam: No acute distress.  Appears chronically ill Respiratory system: Poor respiratory effort.  Lungs clear.  Room air Cardiovascular system: S1-S2, regular rhythm, no murmurs Gastrointestinal system: Nontender, nondistended, normal bowel sounds  Central nervous system: Alert, oriented x1, no focal deficits extremities: Decreased power symmetrically Skin: No rashes, lesions or ulcers Psychiatry: Judgement and insight appear impaired. Mood & affect flattened.     Data Reviewed: I have personally reviewed following labs and imaging studies  CBC: Recent Labs  Lab 06/14/20 1220 06/15/20 0344 06/16/20 0421 06/17/20 0155 06/17/20 0813  WBC 9.5 8.7 9.5 12.0*  --   NEUTROABS  --   --   --  9.6*  --   HGB 9.7* 8.9* 9.3* 8.6* 8.1*  HCT 31.2* 28.0* 29.5* 26.2*  --   MCV 88.1 87.0 87.3 86.8  --   PLT 591* 466* 413* 397  --    Basic Metabolic Panel: Recent Labs  Lab 06/14/20 1220 06/15/20 0344 06/16/20 0730 06/17/20 0155  NA 138 139 140 138  K 4.0 3.6 4.1 4.1  CL 102 107 107 105  CO2 24 23 24 25   GLUCOSE 174* 144* 129* 160*  BUN 42* 40* 38* 38*  CREATININE 2.41* 2.18* 2.17* 2.26*  CALCIUM 9.3 8.7* 8.9 8.8*  MG 2.5*  --  2.4 2.3   GFR: Estimated Creatinine Clearance: 39.1 mL/min (A) (by C-G formula based on SCr of 2.26 mg/dL (H)). Liver Function Tests: No results for input(s): AST, ALT, ALKPHOS, BILITOT, PROT, ALBUMIN in the last 168 hours. No results for input(s): LIPASE, AMYLASE in the last 168 hours. No results for input(s): AMMONIA in the last 168 hours. Coagulation Profile: Recent Labs  Lab 06/14/20 1220  INR 1.2   Cardiac Enzymes: No results for input(s): CKTOTAL, CKMB, CKMBINDEX, TROPONINI in the last 168 hours. BNP (last 3 results) No results for input(s): PROBNP in the last 8760 hours. HbA1C: Recent Labs    06/15/20 0344  HGBA1C 6.0*   CBG: Recent  Labs  Lab 06/15/20 0807 06/16/20 0757 06/16/20 2017 06/17/20 0753  GLUCAP 157* 133* 144* 174*   Lipid Profile: Recent Labs    06/15/20 0344  CHOL 131  HDL 30*  LDLCALC 81  TRIG 100  CHOLHDL 4.4   Thyroid Function Tests: Recent Labs    06/15/20 0344  TSH 1.978   Anemia Panel: No results for input(s): VITAMINB12, FOLATE, FERRITIN, TIBC, IRON, RETICCTPCT in the last 72 hours. Sepsis Labs: No results for input(s): PROCALCITON, LATICACIDVEN in the last 168 hours.  Recent Results (from the past 240 hour(s))  Resp Panel by RT-PCR (Flu A&B, Covid) Nasopharyngeal Swab     Status: None   Collection Time: 06/14/20  2:40 PM   Specimen: Nasopharyngeal Swab; Nasopharyngeal(NP) swabs in vial transport medium  Result Value Ref Range Status   SARS Coronavirus 2 by RT PCR NEGATIVE NEGATIVE Final    Comment: (NOTE) SARS-CoV-2 target nucleic acids are NOT DETECTED.  The SARS-CoV-2 RNA is generally detectable in upper respiratory specimens during the acute phase of infection. The lowest concentration of SARS-CoV-2 viral copies this assay can detect is 138 copies/mL. A negative result does not preclude SARS-Cov-2 infection and should not be used as the sole basis for treatment or other patient management decisions. A negative result may occur with  improper specimen collection/handling, submission of specimen other than nasopharyngeal swab, presence of viral mutation(s) within the areas targeted by this assay, and inadequate number of viral copies(<138 copies/mL). A negative result must be combined with clinical observations, patient history, and epidemiological information. The expected result is Negative.  Fact Sheet for Patients:  EntrepreneurPulse.com.au  Fact Sheet for Healthcare Providers:  IncredibleEmployment.be  This test is no t yet approved or cleared by the [place] FDA and  has been authorized for detection and/or diagnosis of  SARS-CoV-2 by FDA under an Emergency Use Authorization (EUA). This EUA will remain  in effect (meaning this test can be used) for the duration of the COVID-19 declaration under Section 564(b)(1) of the Act, 21 U.S.C.section 360bbb-3(b)(1), unless the authorization is terminated  or revoked sooner.       Influenza A by PCR NEGATIVE NEGATIVE Final   Influenza B by PCR NEGATIVE NEGATIVE Final    Comment: (NOTE) The Xpert Xpress SARS-CoV-2/FLU/RSV plus assay is intended as an aid in the diagnosis of influenza from Nasopharyngeal swab specimens and should not be used as a sole basis for treatment. Nasal washings and aspirates are unacceptable for Xpert Xpress SARS-CoV-2/FLU/RSV testing.  Fact Sheet for Patients: EntrepreneurPulse.com.au  Fact Sheet for Healthcare Providers: IncredibleEmployment.be  This test is not yet approved or cleared by the [place] FDA and has been authorized for detection and/or diagnosis of SARS-CoV-2  by FDA under an Emergency Use Authorization (EUA). This EUA will remain in effect (meaning this test can be used) for the duration of the COVID-19 declaration under Section 564(b)(1) of the Act, 21 U.S.C. section 360bbb-3(b)(1), unless the authorization is terminated or revoked.  Performed at [facility], [address] [place], [place] [address]          Radiology Studies: No results found.      Scheduled Meds: . cholecalciferol  2,000 Units Oral Daily  . diltiazem  180 mg Oral Daily  . ferrous sulfate  325 mg Oral Daily  . finasteride  5 mg Oral Daily  . multivitamin with minerals  1 tablet Oral Daily  . pantoprazole (PROTONIX) IV  40 mg Intravenous Q12H  . polyethylene glycol  17 g Oral Daily  . sertraline  25 mg Oral QHS  . vitamin B-12  1,000 mcg Oral Daily   Continuous Infusions:   LOS: 2 days    Time spent: 25 minutes    Sidney Ace, MD [facility] Pager  [phone]  If 7PM-7AM, please contact night-coverage 06/17/2020, 10:32 AM

## 2020-06-17 NOTE — Progress Notes (Signed)
Patient sustaining HR in the 130's, prn Metoprolol given.  Will continue to monitor.    06/17/20 0304  Assess: MEWS Score  Temp 98.3 F (36.8 C)  BP (!) 106/48  Pulse Rate (!) 134  Resp 17  Level of Consciousness Alert  SpO2 95 %  O2 Device Room Air  Assess: if the MEWS score is Yellow or Red  Were vital signs taken at a resting state? Yes  Focused Assessment Change from prior assessment (see assessment flowsheet)  Early Detection of Sepsis Score *See Row Information* Medium  MEWS guidelines implemented *See Row Information* Yes  Treat  MEWS Interventions Administered prn meds/treatments  Pain Scale Faces  Faces Pain Scale 0  Take Vital Signs  Increase Vital Sign Frequency  Yellow: Q 2hr X 2 then Q 4hr X 2, if remains yellow, continue Q 4hrs  Escalate  MEWS: Escalate Yellow: discuss with charge nurse/RN and consider discussing with provider and RRT  Notify: Charge Nurse/RN  Name of Charge Nurse/RN Notified Chief Operating Officer  Date Charge Nurse/RN Notified 06/17/20  Time Charge Nurse/RN Notified [phone]  Document  Patient Outcome Stabilized after interventions  Progress note created (see row info) Yes

## 2020-06-18 ENCOUNTER — Inpatient Hospital Stay: Payer: Medicare Other

## 2020-06-18 DIAGNOSIS — Z7189 Other specified counseling: Secondary | ICD-10-CM

## 2020-06-18 DIAGNOSIS — K922 Gastrointestinal hemorrhage, unspecified: Secondary | ICD-10-CM | POA: Diagnosis not present

## 2020-06-18 DIAGNOSIS — N184 Chronic kidney disease, stage 4 (severe): Secondary | ICD-10-CM | POA: Diagnosis not present

## 2020-06-18 DIAGNOSIS — Z515 Encounter for palliative care: Secondary | ICD-10-CM | POA: Diagnosis not present

## 2020-06-18 DIAGNOSIS — R778 Other specified abnormalities of plasma proteins: Secondary | ICD-10-CM | POA: Diagnosis not present

## 2020-06-18 DIAGNOSIS — I4891 Unspecified atrial fibrillation: Secondary | ICD-10-CM | POA: Diagnosis not present

## 2020-06-18 LAB — CBC WITH DIFFERENTIAL/PLATELET
Abs Immature Granulocytes: 0.08 10*3/uL — ABNORMAL HIGH (ref 0.00–0.07)
Basophils Absolute: 0 10*3/uL (ref 0.0–0.1)
Basophils Relative: 0 %
Eosinophils Absolute: 0.1 10*3/uL (ref 0.0–0.5)
Eosinophils Relative: 0 %
HCT: 19.2 % — ABNORMAL LOW (ref 39.0–52.0)
Hemoglobin: 6.2 g/dL — ABNORMAL LOW (ref 13.0–17.0)
Immature Granulocytes: 1 %
Lymphocytes Relative: 8 %
Lymphs Abs: 1.1 10*3/uL (ref 0.7–4.0)
MCH: 27.8 pg (ref 26.0–34.0)
MCHC: 32.3 g/dL (ref 30.0–36.0)
MCV: 86.1 fL (ref 80.0–100.0)
Monocytes Absolute: 1.3 10*3/uL — ABNORMAL HIGH (ref 0.1–1.0)
Monocytes Relative: 10 %
Neutro Abs: 10.2 10*3/uL — ABNORMAL HIGH (ref 1.7–7.7)
Neutrophils Relative %: 81 %
Platelets: 283 10*3/uL (ref 150–400)
RBC: 2.23 MIL/uL — ABNORMAL LOW (ref 4.22–5.81)
RDW: 14.4 % (ref 11.5–15.5)
WBC: 12.7 10*3/uL — ABNORMAL HIGH (ref 4.0–10.5)
nRBC: 0 % (ref 0.0–0.2)

## 2020-06-18 LAB — GLUCOSE, CAPILLARY
Glucose-Capillary: 189 mg/dL — ABNORMAL HIGH (ref 70–99)
Glucose-Capillary: 173 mg/dL — ABNORMAL HIGH (ref 70–99)
Glucose-Capillary: 178 mg/dL — ABNORMAL HIGH (ref 70–99)

## 2020-06-18 LAB — BASIC METABOLIC PANEL
Anion gap: 11 (ref 5–15)
BUN: 51 mg/dL — ABNORMAL HIGH (ref 8–23)
CO2: 22 mmol/L (ref 22–32)
Calcium: 8.3 mg/dL — ABNORMAL LOW (ref 8.9–10.3)
Chloride: 105 mmol/L (ref 98–111)
Creatinine, Ser: 3.15 mg/dL — ABNORMAL HIGH (ref 0.61–1.24)
GFR, Estimated: 20 mL/min — ABNORMAL LOW (ref >60)
Glucose, Bld: 188 mg/dL — ABNORMAL HIGH (ref 70–99)
Potassium: 4.7 mmol/L (ref 3.5–5.1)
Sodium: 138 mmol/L (ref 135–145)

## 2020-06-18 LAB — MAGNESIUM: Magnesium: 2.2 mg/dL (ref 1.7–2.4)

## 2020-06-18 LAB — PROTIME-INR
INR: 1.3 — ABNORMAL HIGH (ref 0.8–1.2)
Prothrombin Time: 16.6 seconds — ABNORMAL HIGH (ref 11.4–15.2)

## 2020-06-18 LAB — HEPARIN LEVEL (UNFRACTIONATED): Heparin Unfractionated: 0.57 IU/mL (ref 0.30–0.70)

## 2020-06-18 LAB — PREPARE RBC (CROSSMATCH)

## 2020-06-18 LAB — HEMOGLOBIN
Hemoglobin: 7 g/dL — ABNORMAL LOW (ref 13.0–17.0)
Hemoglobin: 7.6 g/dL — ABNORMAL LOW (ref 13.0–17.0)
Hemoglobin: 6.9 g/dL — ABNORMAL LOW (ref 13.0–17.0)

## 2020-06-18 LAB — APTT: aPTT: 41 seconds — ABNORMAL HIGH (ref 24–36)

## 2020-06-18 MED ORDER — SODIUM CHLORIDE 0.9% IV SOLUTION: Freq: Once | INTRAVENOUS | Status: AC

## 2020-06-18 MED ORDER — TECHNETIUM TC 99M-LABELED RED BLOOD CELLS IV KIT
25.0000 | PACK | Freq: Once | INTRAVENOUS | Status: AC | PRN
  Administered 2020-06-18: 23.24 via INTRAVENOUS

## 2020-06-18 MED ORDER — SODIUM CHLORIDE 0.9% IV SOLUTION: Freq: Once | INTRAVENOUS | Status: DC

## 2020-06-18 MED ORDER — PROTHROMBIN COMPLEX CONC HUMAN 500 UNITS IV KIT
4640.0000 [IU] | PACK | Status: DC
  Filled 2020-06-18: qty 4640

## 2020-06-18 NOTE — Progress Notes (Addendum)
Patient had 2 more maroon large  stool with blood clots, 2nd unit PRBC infusing at this time

## 2020-06-18 NOTE — Progress Notes (Signed)
Patient had 2 large loose stool with blood clots, HGB 6.3, Brenda NP notified, new order received  1 unit PRBC to be transfuse

## 2020-06-18 NOTE — Progress Notes (Signed)
PROGRESS NOTE    Christopher Delacruz  KYH:[id number] DOB: [birth year] DOA: 06/14/2020 PCP: [name], MD   Brief Narrative:   70 y.o. male with medical history significant of HTN, HLD, DM, stroke with right-sided weakness and aphasia, CKD-IV, anemia, BPH, AVR, rectal CA (s/p of robotic LAR resection 05/25/20), hypothyroidism, depression with anxiety, cognitive impairment, dementia, who presents with tachycardia.  Patient has history of dementia and aphasia secondary to previous stroke, and appears to be very poor historian and is unable to provide accurate medical history, therefore, most of the history is obtained by discussing the case with ED physician, per EMS report, and with the nursing staff.  Per report, pt was found to have tachycardia with in SNF. Per EDP, pt was found to new onset A fib with RVR with HR up to 140s in ED. Pt was given 15 mg of Cardizem IV and started Cardizem drip.  Patient converted to sinus rhythm.  Discussed with cardiology.  Converting to p.o. Cardizem long acting and starting Eliquis.  Patient has a complex urologic and oncologic history.  As such he is a high risk for complications and will need close follow-up.  Overall rate controlled with occasional bursts of rapid atrial fibrillation.  Control beta-blocker.  5/22: Rate control remains challenging.  On 5/21 patient had episode of stool with maroon-colored blood and clots.  On 5/22 patient had large-volume maroon stool with clots passed per rectum.  Notified cardiology.  Eliquis has been discontinued.  Serial hemoglobins.  5/23: Continues to have maroon-colored stools with clots.  Hemoglobin continues to drop.  Remains in atrial flutter, ventricular rate approximately 100.  Blood pressure low but stable.  Status post 2 units packed red blood cells    Assessment & Plan:   Principal Problem:   Atrial fibrillation with RVR (HCC) Active Problems:   HTN (hypertension)   Benign prostatic hyperplasia without  lower urinary tract symptoms   Acute GI bleeding   Iron deficiency anemia due to chronic blood loss   Rectal cancer s/p robotic LAR resectrion 05/25/2020   History of CVA (cerebrovascular accident)   Hypothyroidism   CKD (chronic kidney disease), stage IV (HCC)   Type II diabetes mellitus with renal manifestations (HCC)   Depression   Elevated troponin   Atrial fibrillation with rapid ventricular response (HCC)  Rectal bleeding Patient passed maroon-colored stool with clots, starting 5/21 Suspect this is secondary to Eliquis Likely not a candidate for endoscopic intervention given multiple comorbidities, recent surgery, poor rate control -Last dose of Eliquis Saturday 5/21 am dose only -Transfused 2 units PRBC so far Plan: Transfuse additional 1 unit PRBC No further anticoagulation Continue Protonix 40 mg IV twice daily Continue trend hemoglobin every 6 hours Pursue nuclear medicine tagged red blood cell scan Depending on results of nuclear scan can reach out to vascular surgery for recommendations however given patient's creatinine of 3, worsening over interval, he is likely a very poor candidate for any angiography that would involve IV contrast  Atrial fibrillation/Aflutter with RVR and elevated troponin Started on Cardizem gtt. in ED Rate controlled converted to sinus rhythm Cardiology consulted Rate control remains challenging TTE EF 66 5% Plan: Continue Cardizem CD 180 mg daily Continue amiodarone gtt. As needed metoprolol pushes for sustained tachycardia Continue holding amlodipine and hydrochlorothiazide Monitor electrolytes, K greater than 4, mag greater than 2 Telemetry monitoring Therapy evaluations, out of bed to chair Follow any further cardiac recommendations Not a candidate for continued anticoagulation or cardioversion  Rectal cancer s/p  robotic LAR resectrion 05/25/2020 -Follow-up with oncology  HTN (hypertension) Amlodipine hydrochlorothiazide on  hold Cardizem CD 180 mg daily  Benign prostatic hyperplasia without lower urinary tract symptoms -Proscar  Iron deficiency anemia due to chronic blood loss Patient had rectal bleeding starting 5/21 Likely secondary to Eliquis Plan: Twice daily PPI Trend hemoglobin Transfuse as needed Hb less than 8 Continue iron supplementation  History of CVA (cerebrovascular accident) -Continue holding dual antiplatelet therapy  Hypothyroidism: Patient's not taking medications currently -TSH within normal range  CKD (chronic kidney disease), stage IV ([place])  Patient had creatinine 1.7 in February, but recently baseline creatinine is 2.13-2.78.   On admission creatinine 2.41, BUN 42 Patient may have established a baseline Monitor BMP daily Avoid nonessential nephrotoxins  Diet controlled Type II diabetes mellitus with renal manifestations [facility]): Recent A1c 5.1, well controlled.  Patient is not taking medications currently.  Depression -Zoloft   DVT prophylaxis: SCD Code Status: DNR.  Discussed and confirmed with patient's nephew Family Communication:  nephew Satchel Heidinger [phone] on 5/22 Disposition Plan: Status is: Inpatient  Remains inpatient appropriate because:Inpatient level of care appropriate due to severity of illness   Dispo: The patient is from: SNF              Anticipated d/c is to: SNF              Patient currently is not medically stable to d/c.   Difficult to place patient No    Remains in atrial flutter with rapid response.  Rate control improved on amiodarone gtt.  Still actively bleeding.  Hemoglobin 7.0 after 2 units packed red blood cells   Level of care: Med-Surg  Consultants:   Cardiology-[facility]  Procedures:   None  Antimicrobials:   None   Subjective: Patient seen and examined.  History limited by underlying dementia and hearing loss.   Objective: Vitals:   06/18/20 0815 06/18/20 0834 06/18/20 1129 06/18/20 1130  BP: 116/69 116/69  (!) 116/57 (!) 116/57  Pulse: (!) 103 (!) 103 (!) 104 (!) 104  Resp: 16 16 16 16   Temp: 98.2 F (36.8 C)  98.1 F (36.7 C) 98.1 F (36.7 C)  TempSrc: Axillary  Oral   SpO2: 100% 99% 99% 99%  Weight:      Height:        Intake/Output Summary (Last 24 hours) at 06/18/2020 1245 Last data filed at 06/18/2020 1148 Gross per 24 hour  Intake 2619.54 ml  Output 201 ml  Net 2418.54 ml   Filed Weights   06/15/20 0539 06/18/20 0447 06/18/20 0738  Weight: 103.8 kg 92.8 kg 91.9 kg    Examination:  General exam: No acute distress.  Appears chronically ill Respiratory system: Poor respiratory effort.  Lungs clear.  Room air Cardiovascular system: S1-S2, regular rhythm, no murmurs Gastrointestinal system: Nontender, nondistended, normal bowel sounds  Central nervous system: Alert, oriented x1, no focal deficits extremities: Decreased power symmetrically Skin: No rashes, lesions or ulcers Psychiatry: Judgement and insight appear impaired. Mood & affect flattened.     Data Reviewed: I have personally reviewed following labs and imaging studies  CBC: Recent Labs  Lab 06/14/20 1220 06/15/20 0344 06/16/20 0421 06/17/20 0155 06/17/20 0813 06/17/20 1418 06/17/20 2346 06/18/20 0426 06/18/20 1202  WBC 9.5 8.7 9.5 12.0*  --   --   --  12.7*  --   NEUTROABS  --   --   --  9.6*  --   --   --  10.2*  --  HGB 9.7* 8.9* 9.3* 8.6* 8.1* 7.3* 6.7* 6.2* 7.0*  HCT 31.2* 28.0* 29.5* 26.2*  --   --   --  19.2*  --   MCV 88.1 87.0 87.3 86.8  --   --   --  86.1  --   PLT 591* 466* 413* 397  --   --   --  283  --    Basic Metabolic Panel: Recent Labs  Lab 06/14/20 1220 06/15/20 0344 06/16/20 0730 06/17/20 0155 06/18/20 0426  NA 138 139 140 138 138  K 4.0 3.6 4.1 4.1 4.7  CL 102 107 107 105 105  CO2 24 23 24 25 22   GLUCOSE 174* 144* 129* 160* 188*  BUN 42* 40* 38* 38* 51*  CREATININE 2.41* 2.18* 2.17* 2.26* 3.15*  CALCIUM 9.3 8.7* 8.9 8.8* 8.3*  MG 2.5*  --  2.4 2.3 2.2    GFR: Estimated Creatinine Clearance: 25.7 mL/min (A) (by C-G formula based on SCr of 3.15 mg/dL (H)). Liver Function Tests: No results for input(s): AST, ALT, ALKPHOS, BILITOT, PROT, ALBUMIN in the last 168 hours. No results for input(s): LIPASE, AMYLASE in the last 168 hours. No results for input(s): AMMONIA in the last 168 hours. Coagulation Profile: Recent Labs  Lab 06/14/20 1220 06/18/20 0727  INR 1.2 1.3*   Cardiac Enzymes: No results for input(s): CKTOTAL, CKMB, CKMBINDEX, TROPONINI in the last 168 hours. BNP (last 3 results) No results for input(s): PROBNP in the last 8760 hours. HbA1C: No results for input(s): HGBA1C in the last 72 hours. CBG: Recent Labs  Lab 06/16/20 0757 06/16/20 2017 06/17/20 0753 06/18/20 0739 06/18/20 1126  GLUCAP 133* 144* 174* 189* 173*   Lipid Profile: No results for input(s): CHOL, HDL, LDLCALC, TRIG, CHOLHDL, LDLDIRECT in the last 72 hours. Thyroid Function Tests: No results for input(s): TSH, T4TOTAL, FREET4, T3FREE, THYROIDAB in the last 72 hours. Anemia Panel: No results for input(s): VITAMINB12, FOLATE, FERRITIN, TIBC, IRON, RETICCTPCT in the last 72 hours. Sepsis Labs: No results for input(s): PROCALCITON, LATICACIDVEN in the last 168 hours.  Recent Results (from the past 240 hour(s))  Resp Panel by RT-PCR (Flu A&B, Covid) Nasopharyngeal Swab     Status: None   Collection Time: 06/14/20  2:40 PM   Specimen: Nasopharyngeal Swab; Nasopharyngeal(NP) swabs in vial transport medium  Result Value Ref Range Status   SARS Coronavirus 2 by RT PCR NEGATIVE NEGATIVE Final    Comment: (NOTE) SARS-CoV-2 target nucleic acids are NOT DETECTED.  The SARS-CoV-2 RNA is generally detectable in upper respiratory specimens during the acute phase of infection. The lowest concentration of SARS-CoV-2 viral copies this assay can detect is 138 copies/mL. A negative result does not preclude SARS-Cov-2 infection and should not be used as the sole  basis for treatment or other patient management decisions. A negative result may occur with  improper specimen collection/handling, submission of specimen other than nasopharyngeal swab, presence of viral mutation(s) within the areas targeted by this assay, and inadequate number of viral copies(<138 copies/mL). A negative result must be combined with clinical observations, patient history, and epidemiological information. The expected result is Negative.  Fact Sheet for Patients:  EntrepreneurPulse.com.au  Fact Sheet for Healthcare Providers:  IncredibleEmployment.be  This test is no t yet approved or cleared by the [place] FDA and  has been authorized for detection and/or diagnosis of SARS-CoV-2 by FDA under an Emergency Use Authorization (EUA). This EUA will remain  in effect (meaning this test can be used) for the  duration of the COVID-19 declaration under Section 564(b)(1) of the Act, 21 U.S.C.section 360bbb-3(b)(1), unless the authorization is terminated  or revoked sooner.       Influenza A by PCR NEGATIVE NEGATIVE Final   Influenza B by PCR NEGATIVE NEGATIVE Final    Comment: (NOTE) The Xpert Xpress SARS-CoV-2/FLU/RSV plus assay is intended as an aid in the diagnosis of influenza from Nasopharyngeal swab specimens and should not be used as a sole basis for treatment. Nasal washings and aspirates are unacceptable for Xpert Xpress SARS-CoV-2/FLU/RSV testing.  Fact Sheet for Patients: EntrepreneurPulse.com.au  Fact Sheet for Healthcare Providers: IncredibleEmployment.be  This test is not yet approved or cleared by the [place] FDA and has been authorized for detection and/or diagnosis of SARS-CoV-2 by FDA under an Emergency Use Authorization (EUA). This EUA will remain in effect (meaning this test can be used) for the duration of the COVID-19 declaration under Section 564(b)(1) of the Act,  21 U.S.C. section 360bbb-3(b)(1), unless the authorization is terminated or revoked.  Performed at [facility], [address], [place], [place] [address]          Radiology Studies: No results found.      Scheduled Meds: . sodium chloride   Intravenous Once  . cholecalciferol  2,000 Units Oral Daily  . diltiazem  180 mg Oral Daily  . ferrous sulfate  325 mg Oral Daily  . finasteride  5 mg Oral Daily  . multivitamin with minerals  1 tablet Oral Daily  . pantoprazole (PROTONIX) IV  40 mg Intravenous Q12H  . polyethylene glycol  17 g Oral Daily  . sertraline  25 mg Oral QHS  . vitamin B-12  1,000 mcg Oral Daily   Continuous Infusions: . amiodarone 30 mg/hr (06/18/20 0453)     LOS: 3 days    Time spent: 25 minutes    Sidney Ace, MD [facility] Pager [phone]  If 7PM-7AM, please contact night-coverage 06/18/2020, 12:45 PM

## 2020-06-18 NOTE — Progress Notes (Signed)
Physical Therapy Treatment Patient Details Name: Christopher Delacruz MRN: [id number] DOB: 19-Apr-1950 Today's Date: 06/18/2020    History of Present Illness Christopher Delacruz is a 27yoM who comes to [facility] on 5/19 from STR at [facility], where he has been for 2 weeks. Pt at baseline resides at [facility]. Comes in via [facility] for tachycardia. 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 Pt admtted with AF/RVR.    PT Comments    Patient seen after receiving multiple units of blood earlier today. Heart rate around 108bpm with bed level activity. Patient was not agreeable to sit up or mobilize today, no reason specified. Patient agreeable to bed level exercise and activity. Patient needs assistance and cues for rolling for hygiene after bowel movement (bedding appears maroon in color). Patient participated in LE strengthening exercises in bed with AAROM. Recommend to continue PT to maximize independence and facilitate return to prior level of function.    Follow Up Recommendations  SNF     Equipment Recommendations  None recommended by PT    Recommendations for Other Services       Precautions / Restrictions Precautions Precautions: Fall Restrictions Weight Bearing Restrictions: No    Mobility  Bed Mobility Overal bed mobility: Needs Assistance Bed Mobility: Rolling Rolling: Min assist (with head of bed almost flat and heavy use of bed rails)         General bed mobility comments: verbal cues for technique for rolling and repositioning in bed. nurse cleaned patient after bowel movement while rolling in bed. patient refused sitting up on edge of bed with no reason specified    Transfers                    Ambulation/Gait                 Stairs             Wheelchair Mobility    Modified Rankin (Stroke Patients Only)       Balance                                             Cognition Arousal/Alertness: Awake/alert Behavior During Therapy: Flat affect Overall Cognitive Status: No family/caregiver present to determine baseline cognitive functioning                                 General Comments: patient required increased time to complete tasks but able to follow single step commands consistently      Exercises General Exercises - Lower Extremity Heel Slides: AAROM;Strengthening;Both;10 reps;Supine Hip ABduction/ADduction: AAROM;Strengthening;Both;10 reps;Supine Straight Leg Raises: AAROM;Strengthening;Both;10 reps;Supine Other Exercises Other Exercises: verbal and visual cues for exercise technique    General Comments        Pertinent Vitals/Pain Pain Assessment: No/denies pain    Home Living                      Prior Function            PT Goals (current goals can now be found in the care plan section) Acute Rehab PT Goals Patient Stated Goal: none stated PT Goal Formulation: With patient Time For Goal Achievement: 06/29/20 Potential  to Achieve Goals: Fair Progress towards PT goals: Progressing toward goals    Frequency    Min 2X/week      PT Plan Current plan remains appropriate    Co-evaluation              AM-PAC PT "6 Clicks" Mobility   Outcome Measure  Help needed turning from your back to your side while in a flat bed without using bedrails?: A Little Help needed moving from lying on your back to sitting on the side of a flat bed without using bedrails?: A Lot Help needed moving to and from a bed to a chair (including a wheelchair)?: A Lot Help needed standing up from a chair using your arms (e.g., wheelchair or bedside chair)?: A Lot Help needed to walk in hospital room?: A Lot Help needed climbing 3-5 steps with a railing? : Total 6 Click Score: 12    End of Session   Activity Tolerance: Patient limited by fatigue Patient left: in  bed;with call bell/phone within reach;with bed alarm set Nurse Communication: Mobility status PT Visit Diagnosis: Other abnormalities of gait and mobility (R26.89);Muscle weakness (generalized) (M62.81)     Time: 3785-8850 PT Time Calculation (min) (ACUTE ONLY): 25 min  Charges:  $Therapeutic Exercise: 8-22 mins $Therapeutic Activity: 8-22 mins                     Minna Merritts, PT, MPT    [name] 06/18/2020, 1:07 PM

## 2020-06-18 NOTE — Progress Notes (Signed)
Progress Note  Patient Name: Christopher Delacruz Date of Encounter: 06/18/2020  [facility] Cardiologist: new to [place] blood count, large maroon stools, receiving second unit packed red blood cells No complaints, resting supine comfortably Telemetry reviewed,Heart rate controlled on amiodarone, rate 100 Blood pressure low but stable  Inpatient Medications    Scheduled Meds: . cholecalciferol  2,000 Units Oral Daily  . diltiazem  180 mg Oral Daily  . ferrous sulfate  325 mg Oral Daily  . finasteride  5 mg Oral Daily  . multivitamin with minerals  1 tablet Oral Daily  . pantoprazole (PROTONIX) IV  40 mg Intravenous Q12H  . polyethylene glycol  17 g Oral Daily  . sertraline  25 mg Oral QHS  . vitamin B-12  1,000 mcg Oral Daily   Continuous Infusions: . amiodarone 30 mg/hr (06/18/20 0453)   PRN Meds: acetaminophen, hydrALAZINE, metoprolol tartrate, ondansetron (ZOFRAN) IV   Vital Signs    Vitals:   06/18/20 0737 06/18/20 0738 06/18/20 0815 06/18/20 0834  BP: (!) 102/27 112/74 116/69 116/69  Pulse: 98 95 (!) 103 (!) 103  Resp: 16  16 16   Temp: 98.1 F (36.7 C)  98.2 F (36.8 C)   TempSrc: Axillary  Axillary   SpO2: 99% 100% 100% 99%  Weight:  91.9 kg    Height:        Intake/Output Summary (Last 24 hours) at 06/18/2020 0933 Last data filed at 06/18/2020 0815 Gross per 24 hour  Intake 2859.54 ml  Output 200 ml  Net 2659.54 ml   Last 3 Weights 06/18/2020 06/18/2020 06/15/2020  Weight (lbs) 202 lb 9.6 oz 204 lb 9.4 oz 228 lb 13.4 oz  Weight (kg) 91.9 kg 92.8 kg 103.8 kg  Some encounter information is confidential and restricted. Go to Review Flowsheets activity to see all data.      Telemetry    Atrial flutter rate 100- Personally Reviewed  ECG     - Personally Reviewed  Physical Exam   GEN: No acute distress.   Neck: No JVD Cardiac:  Irregularly irregular no murmurs, rubs, or gallops.  Respiratory: Clear to auscultation  bilaterally. GI: Soft, nontender, non-distended  MS: No edema; No deformity. Neuro:  Nonfocal  Psych: Normal affect   Labs    High Sensitivity Troponin:   Recent Labs  Lab 06/14/20 1220 06/14/20 1804 06/14/20 2051  TROPONINIHS 19* 19* 18*      Chemistry Recent Labs  Lab 06/16/20 0730 06/17/20 0155 06/18/20 0426  NA 140 138 138  K 4.1 4.1 4.7  CL 107 105 105  CO2 24 25 22   GLUCOSE 129* 160* 188*  BUN 38* 38* 51*  CREATININE 2.17* 2.26* 3.15*  CALCIUM 8.9 8.8* 8.3*  GFRNONAA 32* 30* 20*  ANIONGAP 9 8 11      Hematology Recent Labs  Lab 06/16/20 0421 06/17/20 0155 06/17/20 0813 06/17/20 1418 06/17/20 2346 06/18/20 0426  WBC 9.5 12.0*  --   --   --  12.7*  RBC 3.38* 3.02*  --   --   --  2.23*  HGB 9.3* 8.6*   < > 7.3* 6.7* 6.2*  HCT 29.5* 26.2*  --   --   --  19.2*  MCV 87.3 86.8  --   --   --  86.1  MCH 27.5 28.5  --   --   --  27.8  MCHC 31.5 32.8  --   --   --  32.3  RDW  13.5 13.4  --   --   --  14.4  PLT 413* 397  --   --   --  283   < > = values in this interval not displayed.    BNPNo results for input(s): BNP, PROBNP in the last 168 hours.   DDimer No results for input(s): DDIMER in the last 168 hours.   Radiology    No results found.  Cardiac Studies     Patient Profile     70 y.o. male with HTN, HLd, DM2, prior stroke with right-sided weakness and aphasia, CKD 4, rectal cancer s/p robotic resection 05/25/20, dementia with cognitive impairment admitted from a nursing home with new onset afib/flutter  Assessment & Plan   Aflutter Challenging rate with tachycardia over the weekend, Unable to tolerate beta-blocker secondary to hypotension Received very few doses Eliquis but this was stopped secondary to maroon stools and anemia -For rate control not a good candidate for digoxin given renal dysfunction -Started on amiodarone over the weekend with improved rate Not a candidate for  anticoagulation or cardioversion  H/o CVA - Home ASA and  plavix held  Eliquis held for active bleed, has been 48 hours  HLD On statin  HTN - home amlodipine and HCTZ held for rate controlling meds - continue dilt ER 180 if blood pressure tolerates otherwise may need to decrease dose or transition to metoprolol tartrate  Acute blood loss Iron deficiency anemia Rectal cancer s/p LAR resection 05/25/20 Dramatic drop in hemoglobin, receiving 2 units, may need additional units for further drop Eliquis held  Case discussed with hospitalist service  Total encounter time more than 35 minutes  Greater than 50% was spent in counseling and coordination of care with the patient   For questions or updates, please contact [facility] Please consult [email or web address] for contact info under        Signed, Ida Rogue, MD  06/18/2020, 9:33 AM

## 2020-06-18 NOTE — Progress Notes (Signed)
Lab notified that blood transfusion is complete and pt is  available for scheduled H & H.

## 2020-06-18 NOTE — Consult Note (Signed)
Christopher Lame, MD [facility]  [address], [place], [place] [address] Phone: [phone] Fax : [phone]  Consultation  Referring Provider:     Dr. Priscella Mann Primary Care Physician:  Christopher Pitch, MD Primary Gastroenterologist:  Christopher Delacruz         Reason for Consultation:     Rectal bleeding  Date of Admission:  06/14/2020 Date of Consultation:  06/18/2020         HPI:   Christopher Delacruz is a 70 y.o. male Who has a history of dementia and is now admitted with rectal bleeding.  The patient had been found to have colon cancer back at the end of last year and had a resection in April of this year.  The patient had been treated with chemotherapy and radiation. The patient was admitted on the 19th with tachycardia with a history of hypertension diabetes hyperlipidemia stroke renal failure anemia BPH hypothyroidism depression and anxiety and dementia.  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 The patient's nephew who is the power of attorney said that the patient had started going through all the treatments for his colon cancer while he was much more able to make decisions and he has had a relatively quick decline in his mental status.  The patient was at a skilled nursing facility and was found to have A. Fib with rapid ventricular rate.  The patient was put on anticoagulation and started having rectal bleeding.  The anticoagulation was stopped but he continues to have bleeding and has received multiple units of packed red cells. The patient's hemoglobin back in April of this year range between 9.8 and 12. During this admission the patient's hemoglobin has dropped down to 6.2 and this morning it was up to 7.6.  The patient denies any abdominal pain.  The patient was also seen by palliative care medicine for his rectal bleeding and had voice to her that he did not want any aggressive treatment.  Past Medical History:  Diagnosis Date  . Acute renal failure superimposed on  stage 3a chronic kidney disease ([place]) 09/07/2019  . B12 deficiency 09/17/2019  . Cancer [facility])    rectal cancer   . CVA (cerebral vascular accident) ([place])   . Diabetes mellitus ([place])    type 2 - controlled by diet on no meds   . Heart murmur   . Hyperlipidemia   . Hypertension   . Right hemiparesis ([place]) 2017   Associated with stroke    Past Surgical History:  Procedure Laterality Date  . COLONOSCOPY N/A 09/11/2019   Procedure: COLONOSCOPY;  Surgeon: Christopher Lame, MD;  Location: [facility] ENDOSCOPY;  Service: Endoscopy;  Laterality: N/A;  . COLONOSCOPY WITH PROPOFOL N/A 09/12/2019   Procedure: COLONOSCOPY WITH PROPOFOL;  Surgeon: [name], MD;  Location: [facility] ENDOSCOPY;  Service: Gastroenterology;  Laterality: N/A;  . COLONOSCOPY WITH PROPOFOL N/A 09/13/2019   Procedure: COLONOSCOPY WITH PROPOFOL;  Surgeon: [name], MD;  Location: [facility] ENDOSCOPY;  Service: Gastroenterology;  Laterality: N/A;  . CYSTOSCOPY N/A 05/25/2020   Procedure: CYSTOSCOPY WITH FIREFLY INJECTIONS AND BILATERAL OPEN ENDED CATHETER PLACEMENT;  Surgeon: Alexis Frock, MD;  Location: [facility] ORS;  Service: Urology;  Laterality: N/A;  . ESOPHAGOGASTRODUODENOSCOPY N/A 09/11/2019   Procedure: ESOPHAGOGASTRODUODENOSCOPY (EGD);  Surgeon: Christopher Lame, MD;  Location: [facility] ENDOSCOPY;  Service: Endoscopy;  Laterality: N/A;  . EXPLORE EYE SOCKET    . PORTACATH PLACEMENT N/A 09/15/2019   Procedure: INSERTION PORT-A-CATH;  Surgeon: Jules Husbands, MD;  Location: [facility] ORS;  Service: General;  Laterality: N/A;  . PROCTOSCOPY N/A 05/25/2020   Procedure: RIGID PROCTOSCOPY;  Surgeon: [name], MD;  Location: [facility] ORS;  Service: General;  Laterality: N/A;  . XI ROBOTIC ASSISTED LOWER ANTERIOR RESECTION N/A 05/25/2020   Procedure: XI ROBOTIC ASSISTED LOWER ANTERIOR RECTOSIGMOID RESECTION WITH MOBILIZATION OF THE SPENIC FLEXURE, BILATERAL TAP BLOCK AND INTRAOPERATIVE ASSESMENT OF PERFUSION;  Surgeon: [name], MD;  Location: [facility] ORS;   Service: General;  Laterality: N/A;    Prior to Admission medications   Medication Sig Start Date End Date Taking? Authorizing Provider  amLODipine (NORVASC) 10 MG tablet Take 1 tablet (10 mg total) by mouth daily. 03/23/18  Yes Demetrios Loll, MD  aspirin EC 81 MG EC tablet Take 1 tablet (81 mg total) by mouth daily. Swallow whole. 09/17/19  Yes Shahmehdi, Valeria Batman, MD  Cholecalciferol (VITAMIN D3) 50 MCG (2000 UT) capsule Take 2,000 Units by mouth daily. 04/24/20  Yes [provider]  clopidogrel (PLAVIX) 75 MG tablet Take 75 mg by mouth daily.   Yes [provider]  ferrous sulfate 325 (65 FE) MG tablet Take 1 tablet (325 mg total) by mouth daily. 09/16/19 09/15/20 Yes Shahmehdi, Seyed A, MD  finasteride (PROSCAR) 5 MG tablet Take 5 mg by mouth daily.   Yes [provider]  GAVILAX 17 GM/SCOOP powder Take 17 g by mouth daily. 05/23/20  Yes [provider]  hydrochlorothiazide (HYDRODIURIL) 12.5 MG tablet Take 12.5 mg by mouth daily. 04/23/20  Yes [provider]  multivitamin (ONE-A-DAY MEN'S) TABS tablet Take 1 tablet by mouth daily.   Yes [provider]  ondansetron (ZOFRAN) 8 MG tablet Take 1 tablet (8 mg total) by mouth 2 (two) times daily as needed for refractory nausea / vomiting. Start on day 3 after chemotherapy. Patient taking differently: Take 2 mg by mouth 2 (two) times daily as needed for nausea (Start on day 3 after chemotherapy.). 09/27/19  Yes [name], MD  prochlorperazine (COMPAZINE) 10 MG tablet Take 10 mg by mouth every 6 (six) hours as needed for nausea or vomiting.   Yes [provider]  sertraline (ZOLOFT) 25 MG tablet Take 25 mg by mouth at bedtime. 04/23/20  Yes [provider]  vitamin B-12 (CYANOCOBALAMIN) 1000 MCG tablet Take 1,000 mcg by mouth daily.   Yes [provider]  traMADol (ULTRAM) 50 MG tablet Take 1-2 tablets (50-100 mg total) by mouth every 6 (six) hours as needed for moderate  pain. Patient not taking: Reported on 06/14/2020 05/29/20   [name], MD    Family History  Problem Relation Age of Onset  . Heart attack Sister   . Leukemia Brother   . Stroke Brother      Social History   Tobacco Use  . Smoking status: Never Smoker  . Smokeless tobacco: Never Used  Vaping Use  . Vaping Use: Never used  Substance Use Topics  . Alcohol use: Never    Alcohol/week: 0.0 standard drinks  . Drug use: Never    Allergies as of 06/14/2020  . (No Known Allergies)    Review of Systems:    All systems reviewed and negative except where noted in HPI.   Physical Exam:  Vital signs in last 24 hours: Temp:  [98.1 F (36.7 C)-98.8 F (37.1 C)] 98.1 F (36.7 C) (05/23 1633) Pulse Rate:  [67-110] 109 (05/23 1633) Resp:  [16-18] 16 (05/23 1633) BP: (102-130)/(27-86) 129/83 (05/23 1633) SpO2:  [96 %-100 %]  96 % (05/23 1633) Weight:  [91.9 kg-92.8 kg] 91.9 kg (05/23 0738) Last BM Date: 06/18/20 General:   Pleasant, cooperative in NAD Head:  Normocephalic and atraumatic. Eyes:   No icterus.   Conjunctiva pink. PERRLA. Ears:  Normal auditory acuity. Neck:  Supple; no masses or thyroidomegaly Lungs: Respirations even and unlabored. Lungs clear to auscultation bilaterally.   No wheezes, crackles, or rhonchi.  Heart:  Regular rate and rhythm;  Without murmur, clicks, rubs or gallops Abdomen:  Soft, nondistended, nontender. Normal bowel sounds. No appreciable masses or hepatomegaly.  No rebound or guarding.  Rectal:  Not performed. Msk:  Symmetrical without gross deformities.    Extremities:  Without edema, cyanosis or clubbing. Neurologic:  Alert and oriented x1;   Skin:  Intact without significant lesions or rashes. Cervical Nodes:  No significant cervical adenopathy. Psych:  Alert and cooperative. Normal affect.  LAB RESULTS: Recent Labs    06/16/20 0421 06/17/20 0155 06/17/20 0813 06/18/20 0426 06/18/20 1202 06/18/20 1705  WBC 9.5 12.0*  --  12.7*  --    --   HGB 9.3* 8.6*   < > 6.2* 7.0* 7.6*  HCT 29.5* 26.2*  --  19.2*  --   --   PLT 413* 397  --  283  --   --    < > = values in this interval not displayed.   BMET Recent Labs    06/16/20 0730 06/17/20 0155 06/18/20 0426  NA 140 138 138  K 4.1 4.1 4.7  CL 107 105 105  CO2 24 25 22   GLUCOSE 129* 160* 188*  BUN 38* 38* 51*  CREATININE 2.17* 2.26* 3.15*  CALCIUM 8.9 8.8* 8.3*   LFT No results for input(s): PROT, ALBUMIN, AST, ALT, ALKPHOS, BILITOT, BILIDIR, IBILI in the last 72 hours. PT/INR Recent Labs    06/18/20 0727  LABPROT 16.6*  INR 1.3*    STUDIES: NM Delacruz Blood Loss  Result Date: 06/18/2020 CLINICAL DATA:  Maroon-colored clot in stool.  Decreased hemoglobin. EXAM: NUCLEAR MEDICINE GASTROINTESTINAL BLEEDING SCAN TECHNIQUE: Sequential abdominal images were obtained following intravenous administration of Tc-73m labeled red blood cells. RADIOPHARMACEUTICALS:  23.24 mCi Tc-60m pertechnetate in-vitro labeled red cells. COMPARISON:  None. FINDINGS: Satisfactory uptake is present into the blood pool. Aorta and iliac vessels are well visualized. No bowel uptake is present within the first hour. Patient refused additional imaging into the second hour. IMPRESSION: 1. No evidence for Delacruz hemorrhage. Electronically Signed   By: San Morelle M.D.   On: 06/18/2020 14:01      Impression / Plan:   Assessment: Principal Problem:   Atrial fibrillation with RVR (HCC) Active Problems:   HTN (hypertension)   Benign prostatic hyperplasia without lower urinary tract symptoms   Acute Delacruz bleeding   Iron deficiency anemia due to chronic blood loss   Rectal cancer s/p robotic LAR resectrion 05/25/2020   History of CVA (cerebrovascular accident)   Hypothyroidism   CKD (chronic kidney disease), stage IV ([place])   Type II diabetes mellitus with renal manifestations (HCC)   Depression   Elevated troponin   Atrial fibrillation with rapid ventricular response (HCC)   Christopher Delacruz is  a 69 y.o. y/o male with multiple medical problems including chronic renal disease and dementia with a Delacruz bleed after having surgery for rectal cancer back in April of this year.  The patient has had continued bleeding even stopping anticoagulation that was given to him for A. Fib with rapid ventricular rate.  The  patient is not able to give much history and I obtained some history from the chart and from the patient's nephew who is the POA.  Plan:  The patient has had multiple colonoscopies in the past and on previous colonoscopies he was not cleaned out adequately and had to be sent home for repeat a prep.  This was when the patient was less cognitively impaired.  The patient is unlikely to take a prep for colonoscopy to look for the source of bleeding.  I have spoken to the POA was the patient's nephew who states that he does not want aggressive care and would like to discuss options with the palliative care/hospice team. If the patient was to be prepped for colonoscopy he would need an NG tube and likely need wrist restraints to stop him from pulling out the NG tube. The idea of this did not seem preferable to the patient's nephew.  I will hold off on any further Delacruz intervention until a decision has been made with the help of palliative medicine and discussions with the nephew decide the level of aggressiveness we will pursue.  Thank you for involving me in the care of this patient.      LOS: 3 days   Christopher Lame, MD, [facility] 06/18/2020, 6:45 PM,  Pager [phone] 7am-5pm  Check AMION for 5pm -7am coverage and on weekends   Note: This dictation was prepared with Dragon dictation along with smaller phrase technology. Any transcriptional errors that result from this process are unintentional.

## 2020-06-18 NOTE — Progress Notes (Signed)
Lab notified that pt had returned from [place] and was now available to draw scheduled  H & H. Lab will send someone up to draw lab.

## 2020-06-18 NOTE — Progress Notes (Signed)
Pt continues to have large bloody "stools", mostly blood and large blood clots. Pt currently receiving unit of PRBC's. MD updated. No new orders at this time. H & H to be obtained post infusion. Pt asymptomatic. Resting comfortably. Will continue to monitor.

## 2020-06-18 NOTE — Progress Notes (Addendum)
Progress Note  Patient Name: Christopher Delacruz Date of Encounter: 06/18/2020  Primary Cardiologist: [facility]  Subjective   Baseline dementia.  Unable to obtain ROS.  Palliative care consult placed yesterday with plan for comfort care.  Inpatient Medications    Scheduled Meds: . cholecalciferol  2,000 Units Oral Daily  . diltiazem  180 mg Oral Daily  . ferrous sulfate  325 mg Oral Daily  . finasteride  5 mg Oral Daily  . multivitamin with minerals  1 tablet Oral Daily  . pantoprazole (PROTONIX) IV  40 mg Intravenous Q12H  . polyethylene glycol  17 g Oral Daily  . sertraline  25 mg Oral QHS  . vitamin B-12  1,000 mcg Oral Daily   Continuous Infusions: . amiodarone 30 mg/hr (06/18/20 0453)  . prothrombin complex conc human (Kcentra) IVPB     PRN Meds: acetaminophen, hydrALAZINE, metoprolol tartrate, ondansetron (ZOFRAN) IV   Vital Signs    Vitals:   06/18/20 0517 06/18/20 0524 06/18/20 0737 06/18/20 0738  BP: (!) 110/59 (!) 110/59 (!) 102/27 112/74  Pulse: (!) 102 (!) 102 98 95  Resp: 18 18 16    Temp: 98.8 F (37.1 C) 98.8 F (37.1 C) 98.1 F (36.7 C)   TempSrc: Oral Oral Axillary   SpO2:  98% 99% 100%  Weight:    91.9 kg  Height:        Intake/Output Summary (Last 24 hours) at 06/18/2020 0831 Last data filed at 06/18/2020 0815 Gross per 24 hour  Intake 3059.54 ml  Output --  Net 3059.54 ml   Last 3 Weights 06/18/2020 06/18/2020 06/15/2020  Weight (lbs) 202 lb 9.6 oz 204 lb 9.4 oz 228 lb 13.4 oz  Weight (kg) 91.9 kg 92.8 kg 103.8 kg  Some encounter information is confidential and restricted. Go to Review Flowsheets activity to see all data.      Telemetry    A. fib with current rates in the 90s -high 110s, PVCs Personally Reviewed  ECG    No new tracings- Personally Reviewed  Physical Exam   GEN: NAD Dementia Neck: No JVD Cardiac:  IRIR, no murmurs, rubs, or gallops.  Respiratory: clear with poor inspiratory effort and anterior auscultation only. GI:  Soft, nontender, non-distended  MS: No edema; No deformity. Neuro:   Dementia Psych: Dementia  Labs    High Sensitivity Troponin:   Recent Labs  Lab 06/14/20 1220 06/14/20 1804 06/14/20 2051  TROPONINIHS 19* 19* 18*      Chemistry Recent Labs  Lab 06/16/20 0730 06/17/20 0155 06/18/20 0426  NA 140 138 138  K 4.1 4.1 4.7  CL 107 105 105  CO2 24 25 22   GLUCOSE 129* 160* 188*  BUN 38* 38* 51*  CREATININE 2.17* 2.26* 3.15*  CALCIUM 8.9 8.8* 8.3*  GFRNONAA 32* 30* 20*  ANIONGAP 9 8 11      Hematology Recent Labs  Lab 06/16/20 0421 06/17/20 0155 06/17/20 0813 06/17/20 1418 06/17/20 2346 06/18/20 0426  WBC 9.5 12.0*  --   --   --  12.7*  RBC 3.38* 3.02*  --   --   --  2.23*  HGB 9.3* 8.6*   < > 7.3* 6.7* 6.2*  HCT 29.5* 26.2*  --   --   --  19.2*  MCV 87.3 86.8  --   --   --  86.1  MCH 27.5 28.5  --   --   --  27.8  MCHC 31.5 32.8  --   --   --  32.3  RDW 13.5 13.4  --   --   --  14.4  PLT 413* 397  --   --   --  283   < > = values in this interval not displayed.    BNPNo results for input(s): BNP, PROBNP in the last 168 hours.   DDimer No results for input(s): DDIMER in the last 168 hours.   Radiology    No results found.  Cardiac Studies   Echo 03/22/18: 1. The left ventricle has normal systolic function, with an ejection  fraction of 55-60%. The cavity size was normal. There is mildly increased  left ventricular wall thickness. Left ventricular diastolic Doppler  parameters are consistent with impaired  relaxation.  2. The right ventricle has normal systolic function. The cavity was  normal. There is no increase in right ventricular wall thickness.  3. The mitral valve is normal in structure. Mild thickening of the mitral  valve leaflet.  4. The tricuspid valve is normal in structure.  5. The aortic valve is normal in structure. Moderate thickening of the  aortic valve Aortic valve regurgitation is mild by color flow Doppler.    Echo 06/15/20 1. Left ventricular ejection fraction, by estimation, is 60 to 65%. The  left ventricle has normal function. The left ventricle has no regional  wall motion abnormalities. There is mild concentric left ventricular  hypertrophy. Left ventricular diastolic  parameters are indeterminate.  2. Right ventricular systolic function is normal. The right ventricular  size is normal.  3. The mitral valve is normal in structure. No evidence of mitral valve  regurgitation. No evidence of mitral stenosis.  4. The aortic valve is normal in structure. Aortic valve regurgitation is  not visualized. No aortic stenosis is present.  5. The inferior vena cava is normal in size with greater than 50%  respiratory variability, suggesting right atrial pressure of 3 mmHg.    Patient Profile     70 y.o. male with history of hypertension, hyperlipidemia, DM2, prior stroke with right-sided weakness and aphasia, CKD, rectal cancer s/p robotic resection 05/25/2020, dementia with cognitive impairment, and seen today for new onset atrial fibrillation/flutter with current plan for comfort care.  Assessment & Plan    Atrial flutter --Unable to tolerate beta-blocker secondary to to hypotension. Not a good candidate for digoxin. Continue oral diltiazem. No anticoagulation recommended secondary to active GI bleeding and plan for comfort care.  --Current plan is for comfort care per recent documentation.  Comfort care -- Cardiology can likely sign off at this time pending MD to see patient.  For questions or updates, please contact [place] Please consult [email or web address] for contact info under        Signed, Arvil Chaco, PA-C  06/18/2020, 8:31 AM

## 2020-06-18 NOTE — Consult Note (Signed)
Consultation Note Date: 06/18/2020   Patient Name: Christopher Delacruz  DOB: 1951-01-09  MRN: [id number]  Age / Sex: 70 y.o., male  PCP: [name], MD Referring Physician: Sidney Ace, MD  Reason for Consultation: Establishing goals of care  HPI/Patient Profile: Christopher Delacruz is a 70 y.o. male with medical history significant of HTN, HLD, DM, stroke with right-sided weakness and aphasia, CKD-IV, anemia, BPH, AVR, rectal CA (s/p of robotic LAR resection 05/25/20), hypothyroidism, depression with anxiety, cognitive impairment, dementia, who presents with tachycardia.  Clinical Assessment and Goals of Care: Patient is resting in bed at this time.  He states he is not married and has no children.  He states he has a nephew.  He tells me that he lives in a facility but cannot tell me the name of the facility.  He cannot tell me the month the year the president, or the name of the hospital.  He can tell me his name, that he is in [place], and that he is at the hospital because of pain everywhere.  He is agreement with having rectal bleeding when asked.  He states that he did have radiation and chemo, and then surgery recently.  He states he did this for his nephew but he did not really want it.  He states that he would just rather focus on comfort.  I asked him if he were to continue to bleed and did not receive blood transfusions or have the bleeding stopped what would happen, and he said "I would die".  He states he is okay with this.  Attempted to contact his nephew however unable to.     SUMMARY OF RECOMMENDATIONS   Unable to reach nephew to discuss care moving forward.  Prognosis:   Poor      Primary Diagnoses: Present on Admission: . Benign prostatic hyperplasia without lower urinary tract symptoms . HTN (hypertension) . Hypothyroidism . Rectal cancer s/p robotic LAR resectrion 05/25/2020 .  CKD (chronic kidney disease), stage IV ([place]) . Type II diabetes mellitus with renal manifestations ([place]) . Depression . Iron deficiency anemia due to chronic blood loss . Atrial fibrillation with RVR ([place]) . Elevated troponin . Atrial fibrillation with rapid ventricular response ([place])   I have reviewed the medical record, interviewed the patient and family, and examined the patient. The following aspects are pertinent.  Past Medical History:  Diagnosis Date  . Acute renal failure superimposed on stage 3a chronic kidney disease ([place]) 09/07/2019  . B12 deficiency 09/17/2019  . Cancer [facility])    rectal cancer   . CVA (cerebral vascular accident) ([place])   . Diabetes mellitus ([place])    type 2 - controlled by diet on no meds   . Heart murmur   . Hyperlipidemia   . Hypertension   . Right hemiparesis ([place]) 2017   Associated with stroke   Social History   Socioeconomic History  . Marital status: Divorced    Spouse name: Not on file  . Number of children: Not on file  .  Years of education: Not on file  . Highest education level: Not on file  Occupational History  . 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 Smoking status: Never Smoker  . Smokeless tobacco: Never Used  Vaping Use  . Vaping Use: Never used  Substance and Sexual Activity  . Alcohol use: Never    Alcohol/week: 0.0 standard drinks  . Drug use: Never  . Sexual activity: Not on file  Other Topics Concern  . Not on file  Social History Narrative   Nephew with [facility] due to cognitive impairment of patient after stroke 2017      Lives in [place] in [place], [place]   Social Determinants of Health   Financial Resource Strain: Not on file  Food Insecurity: Not on file  Transportation Needs: Unmet Transportation Needs  . Lack of Transportation (Medical): Yes  . Lack of Transportation (Non-Medical): Yes   Physical Activity: Not on file  Stress: Not on file  Social Connections: Not on file   Family History  Problem Relation Age of Onset  . Heart attack Sister   . Leukemia Brother   . Stroke Brother    Scheduled Meds: . sodium chloride   Intravenous Once  . cholecalciferol  2,000 Units Oral Daily  . diltiazem  180 mg Oral Daily  . ferrous sulfate  325 mg Oral Daily  . finasteride  5 mg Oral Daily  . multivitamin with minerals  1 tablet Oral Daily  . pantoprazole (PROTONIX) IV  40 mg Intravenous Q12H  . polyethylene glycol  17 g Oral Daily  . sertraline  25 mg Oral QHS  . vitamin B-12  1,000 mcg Oral Daily   Continuous Infusions: . amiodarone 30 mg/hr (06/18/20 1509)   PRN Meds:.acetaminophen, hydrALAZINE, metoprolol tartrate, ondansetron (ZOFRAN) IV Medications Prior to Admission:  Prior to Admission medications   Medication Sig Start Date End Date Taking? Authorizing Provider  amLODipine (NORVASC) 10 MG tablet Take 1 tablet (10 mg total) by mouth daily. 03/23/18  Yes Demetrios Loll, MD  aspirin EC 81 MG EC tablet Take 1 tablet (81 mg total) by mouth daily. Swallow whole. 09/17/19  Yes Shahmehdi, Valeria Batman, MD  Cholecalciferol (VITAMIN D3) 50 MCG (2000 UT) capsule Take 2,000 Units by mouth daily. 04/24/20  Yes [provider]  clopidogrel (PLAVIX) 75 MG tablet Take 75 mg by mouth daily.   Yes [provider]  ferrous sulfate 325 (65 FE) MG tablet Take 1 tablet (325 mg total) by mouth daily. 09/16/19 09/15/20 Yes Shahmehdi, Seyed A, MD  finasteride (PROSCAR) 5 MG tablet Take 5 mg by mouth daily.   Yes [provider]  GAVILAX 17 GM/SCOOP powder Take 17 g by mouth daily. 05/23/20  Yes [provider]  hydrochlorothiazide (HYDRODIURIL) 12.5 MG tablet Take 12.5 mg by mouth daily. 04/23/20  Yes [provider]  multivitamin (ONE-A-DAY MEN'S) TABS tablet Take 1 tablet by mouth daily.   Yes [provider]  ondansetron (ZOFRAN) 8 MG tablet Take 1  tablet (8 mg total) by mouth 2 (two) times daily as needed for refractory nausea / vomiting. Start on day 3 after chemotherapy. Patient taking differently: Take 2 mg by mouth 2 (two) times daily as needed for nausea (Start on day 3 after chemotherapy.). 09/27/19  Yes [name], MD  prochlorperazine (COMPAZINE) 10 MG tablet Take 10 mg by  mouth every 6 (six) hours as needed for nausea or vomiting.   Yes [provider]  sertraline (ZOLOFT) 25 MG tablet Take 25 mg by mouth at bedtime. 04/23/20  Yes [provider]  vitamin B-12 (CYANOCOBALAMIN) 1000 MCG tablet Take 1,000 mcg by mouth daily.   Yes [provider]  traMADol (ULTRAM) 50 MG tablet Take 1-2 tablets (50-100 mg total) by mouth every 6 (six) hours as needed for moderate pain. Patient not taking: Reported on 06/14/2020 05/29/20   [name], MD   No Known Allergies Review of Systems  Constitutional: Positive for fatigue.    Physical Exam Pulmonary:     Effort: Pulmonary effort is normal.  Neurological:     Mental Status: He is alert.     Vital Signs: BP 130/71   Pulse (!) 106   Temp 98.2 F (36.8 C) (Oral)   Resp 16   Ht 6\' 3"  (1.905 m)   Wt 91.9 kg   SpO2 99%   BMI 25.32 kg/m  Pain Scale: 0-10 POSS *See Group Information*: 1-Acceptable,Awake and alert Pain Score: 0-No pain   SpO2: SpO2: 99 % O2 Device:SpO2: 99 % O2 Flow Rate: .   IO: Intake/output summary:   Intake/Output Summary (Last 24 hours) at 06/18/2020 1533 Last data filed at 06/18/2020 1509 Gross per 24 hour  Intake 2571.39 ml  Output 201 ml  Net 2370.39 ml    LBM: Last BM Date: 06/18/20 Baseline Weight: Weight: 113.4 kg Most recent weight: Weight: 91.9 kg        Time In: 3:00 Time Out: 3:30 Time Total: 30 min Greater than 50%  of this time was spent counseling and coordinating care related to the above assessment and plan.  Signed by: Asencion Gowda, NP   Please contact Palliative Medicine Team phone at [phone] for  questions and concerns.  For individual provider: See Shea Evans

## 2020-06-18 NOTE — Plan of Care (Signed)

## 2020-06-19 ENCOUNTER — Telehealth: Payer: Self-pay | Admitting: Oncology

## 2020-06-19 ENCOUNTER — Other Ambulatory Visit: Payer: Medicare Other

## 2020-06-19 DIAGNOSIS — I4891 Unspecified atrial fibrillation: Secondary | ICD-10-CM | POA: Diagnosis not present

## 2020-06-19 DIAGNOSIS — Z7189 Other specified counseling: Secondary | ICD-10-CM | POA: Diagnosis not present

## 2020-06-19 LAB — CBC WITH DIFFERENTIAL/PLATELET
Abs Immature Granulocytes: 0.05 10*3/uL (ref 0.00–0.07)
Basophils Absolute: 0 10*3/uL (ref 0.0–0.1)
Basophils Relative: 0 %
Eosinophils Absolute: 0 10*3/uL (ref 0.0–0.5)
Eosinophils Relative: 0 %
HCT: 19.2 % — ABNORMAL LOW (ref 39.0–52.0)
Hemoglobin: 6.4 g/dL — ABNORMAL LOW (ref 13.0–17.0)
Immature Granulocytes: 0 %
Lymphocytes Relative: 6 %
Lymphs Abs: 0.8 10*3/uL (ref 0.7–4.0)
MCH: 28.3 pg (ref 26.0–34.0)
MCHC: 33.3 g/dL (ref 30.0–36.0)
MCV: 85 fL (ref 80.0–100.0)
Monocytes Absolute: 1.3 10*3/uL — ABNORMAL HIGH (ref 0.1–1.0)
Monocytes Relative: 10 %
Neutro Abs: 10.1 10*3/uL — ABNORMAL HIGH (ref 1.7–7.7)
Neutrophils Relative %: 84 %
Platelets: 200 10*3/uL (ref 150–400)
RBC: 2.26 MIL/uL — ABNORMAL LOW (ref 4.22–5.81)
RDW: 15.2 % (ref 11.5–15.5)
WBC: 12.2 10*3/uL — ABNORMAL HIGH (ref 4.0–10.5)
nRBC: 0 % (ref 0.0–0.2)

## 2020-06-19 LAB — BASIC METABOLIC PANEL
Anion gap: 10 (ref 5–15)
BUN: 61 mg/dL — ABNORMAL HIGH (ref 8–23)
CO2: 22 mmol/L (ref 22–32)
Calcium: 8.2 mg/dL — ABNORMAL LOW (ref 8.9–10.3)
Chloride: 103 mmol/L (ref 98–111)
Creatinine, Ser: 3.91 mg/dL — ABNORMAL HIGH (ref 0.61–1.24)
GFR, Estimated: 16 mL/min — ABNORMAL LOW (ref >60)
Glucose, Bld: 175 mg/dL — ABNORMAL HIGH (ref 70–99)
Potassium: 4.5 mmol/L (ref 3.5–5.1)
Sodium: 135 mmol/L (ref 135–145)

## 2020-06-19 LAB — MAGNESIUM: Magnesium: 2.3 mg/dL (ref 1.7–2.4)

## 2020-06-19 LAB — HEMOGLOBIN: Hemoglobin: 7.2 g/dL — ABNORMAL LOW (ref 13.0–17.0)

## 2020-06-19 LAB — PREPARE RBC (CROSSMATCH)

## 2020-06-19 LAB — GLUCOSE, CAPILLARY: Glucose-Capillary: 190 mg/dL — ABNORMAL HIGH (ref 70–99)

## 2020-06-19 MED ORDER — LORAZEPAM 2 MG/ML PO CONC
1.0000 mg | ORAL | Status: DC | PRN
  Filled 2020-06-19: qty 0.5

## 2020-06-19 MED ORDER — BIOTENE DRY MOUTH MT LIQD: 15.0000 mL | OROMUCOSAL | Status: DC | PRN

## 2020-06-19 MED ORDER — ACETAMINOPHEN 325 MG PO TABS
650.0000 mg | ORAL_TABLET | Freq: Four times a day (QID) | ORAL | Status: DC | PRN
  Administered 2020-06-26: 650 mg via ORAL
  Filled 2020-06-19: qty 2

## 2020-06-19 MED ORDER — GLYCOPYRROLATE 0.2 MG/ML IJ SOLN
0.2000 mg | INTRAMUSCULAR | Status: DC | PRN
  Filled 2020-06-19: qty 1

## 2020-06-19 MED ORDER — SODIUM CHLORIDE 0.9% IV SOLUTION: Freq: Once | INTRAVENOUS | Status: DC

## 2020-06-19 MED ORDER — ACETAMINOPHEN 650 MG RE SUPP: 650.0000 mg | Freq: Four times a day (QID) | RECTAL | Status: DC | PRN

## 2020-06-19 MED ORDER — LORAZEPAM 1 MG PO TABS
1.0000 mg | ORAL_TABLET | ORAL | Status: DC | PRN
  Administered 2020-06-20 – 2020-06-27 (×10): 1 mg via ORAL
  Filled 2020-06-19 (×11): qty 1

## 2020-06-19 MED ORDER — SODIUM CHLORIDE 0.9% FLUSH
3.0000 mL | Freq: Two times a day (BID) | INTRAVENOUS | Status: DC
  Administered 2020-06-19 – 2020-06-27 (×17): 3 mL via INTRAVENOUS

## 2020-06-19 MED ORDER — SODIUM CHLORIDE 0.9% FLUSH
3.0000 mL | INTRAVENOUS | Status: DC | PRN
Start: 2020-06-19 — End: 2020-06-27

## 2020-06-19 MED ORDER — LORAZEPAM 2 MG/ML IJ SOLN
1.0000 mg | INTRAMUSCULAR | Status: DC | PRN
  Administered 2020-06-23 – 2020-06-26 (×5): 1 mg via INTRAVENOUS
  Filled 2020-06-19 (×5): qty 1

## 2020-06-19 MED ORDER — GLYCOPYRROLATE 1 MG PO TABS
1.0000 mg | ORAL_TABLET | ORAL | Status: DC | PRN
  Filled 2020-06-19: qty 1

## 2020-06-19 MED ORDER — MORPHINE SULFATE (PF) 2 MG/ML IV SOLN
1.0000 mg | INTRAVENOUS | Status: DC | PRN
  Administered 2020-06-23: 14:00:00 1 mg via INTRAVENOUS
  Filled 2020-06-19: qty 1

## 2020-06-19 MED ORDER — HALOPERIDOL LACTATE 5 MG/ML IJ SOLN
0.5000 mg | INTRAMUSCULAR | Status: DC | PRN
  Administered 2020-06-22 – 2020-06-24 (×2): 0.5 mg via INTRAVENOUS
  Filled 2020-06-19 (×2): qty 1

## 2020-06-19 MED ORDER — POLYVINYL ALCOHOL 1.4 % OP SOLN
1.0000 [drp] | Freq: Four times a day (QID) | OPHTHALMIC | Status: DC | PRN
  Filled 2020-06-19: qty 15

## 2020-06-19 MED ORDER — ONDANSETRON 4 MG PO TBDP
4.0000 mg | ORAL_TABLET | Freq: Four times a day (QID) | ORAL | Status: DC | PRN
  Filled 2020-06-19: qty 1

## 2020-06-19 MED ORDER — HALOPERIDOL LACTATE 2 MG/ML PO CONC
0.5000 mg | ORAL | Status: DC | PRN
  Filled 2020-06-19: qty 0.3

## 2020-06-19 MED ORDER — HALOPERIDOL 0.5 MG PO TABS
0.5000 mg | ORAL_TABLET | ORAL | Status: DC | PRN
  Filled 2020-06-19: qty 1

## 2020-06-19 MED ORDER — ONDANSETRON HCL 4 MG/2ML IJ SOLN: 4.0000 mg | Freq: Four times a day (QID) | INTRAMUSCULAR | Status: DC | PRN

## 2020-06-19 NOTE — Telephone Encounter (Signed)
Transportation services contacted patients nephew [name] to set up transportation for his visit on 5/25.  Mark informed TS that the patient was currently admitted and would be transitioning to Hospice services and will not need any appointments moving forward.

## 2020-06-19 NOTE — Progress Notes (Signed)
OT Cancellation Note  Patient Details Name: Christopher Delacruz MRN: [id number] DOB: [birth year]   Cancelled Treatment:    Reason Eval/Treat Not Completed: Medical issues which prohibited therapy. Per chart review, pt currently receiving RBC d/t low Hb (6.4) this AM. OT to re-attempt at later time/date when pt is medically appropriate.   Fredirick Maudlin, OTR/L [place]

## 2020-06-19 NOTE — Telephone Encounter (Signed)
FYI

## 2020-06-19 NOTE — Care Management Important Message (Signed)
Important Message  Patient Details  Name: Linus Weckerly MRN: [id number] Date of Birth: 03-13-1950   Medicare Important Message Given:  Other (see comment)  Patient converted to comfort care.  Medicare IM withheld at this time.    Dannette Barbara 06/19/2020, 2:10 PM

## 2020-06-19 NOTE — Progress Notes (Signed)
PT Cancellation Note  Patient Details Name: Christopher Delacruz MRN: [id number] DOB: 23-Feb-1950   Cancelled Treatment:    Reason Eval/Treat Not Completed: Other (comment).  PT orders have been discontinued by the provider. Please re-consult as needed if goals of care change.   Minna Merritts, PT, MPT  [name] 06/19/2020, 1:31 PM

## 2020-06-19 NOTE — Progress Notes (Addendum)
Daily Progress Note   Patient Name: Christopher Delacruz       Date: 06/19/2020 DOB: [birth year]  Age: 70 y.o. MRN#: [id number] Attending Physician: Sidney Ace, MD Primary Care Physician: [name], MD Admit Date: 06/14/2020  Reason for Consultation/Follow-up: Establishing goals of care  Subjective: Patient is resting in bed with his nephew at bedside.  Patient denies complaint at this time.    We discussed his diagnosis, prognosis, GOC, EOL wishes disposition and options.  Created space and opportunity for patient  to explore thoughts and feelings regarding current medical information.   A detailed discussion was had today regarding advanced directives.  Concepts specific to code status, artifical feeding and hydration, IV antibiotics and rehospitalization were discussed.  The difference between an aggressive medical intervention path and a comfort care path was discussed.  Values and goals of care important to patient and family were attempted to be elicited.  Discussed limitations of medical interventions to prolong quality of life in some situations and discussed the concept of human mortality.  Discussed multiple different scenarios, none of which would give the patient a quality of life that he would be accepting of.  Patient states that he is tired of having medical procedures and interventions; he does not want any more.  He states he just wants to be comfortable for the time he has left.  He understands that the plan will be to focus on his comfort until death, and understands his prognosis is 1 to 2 weeks.  He and his nephew would like for him to go to the hospice facility. Discussed his blood transfusions which he would like to stop. Discussed his current amiodarone infusion, and  he would like to stop this. Discussed no further sticks including glucose checks.   I completed a MOST form today with patient and nephew, and the signed original was placed in the chart. The form was signed by patient's nephew who is HPOA, with patient consent.  A photocopy was placed in the chart to be scanned into EMR. The patient outlined their wishes for the following treatment decisions:  Cardiopulmonary Resuscitation: Do Not Attempt Resuscitation (DNR/No CPR)  Medical Interventions: Comfort Measures: Keep clean, warm, and dry. Use medication by any route, positioning, wound care, and other measures to relieve pain and suffering. Use oxygen, suction and  manual treatment of airway obstruction as needed for comfort. Do not transfer to the hospital unless comfort needs cannot be met in current location.  Antibiotics: No antibiotics (use other measures to relieve symptoms)  IV Fluids: No IV fluids (provide other measures to ensure comfort)  Feeding Tube: No feeding tube     Length of Stay: 4  Current Medications: Scheduled Meds:  . sodium chloride   Intravenous Once  . sodium chloride   Intravenous Once  . cholecalciferol  2,000 Units Oral Daily  . diltiazem  180 mg Oral Daily  . ferrous sulfate  325 mg Oral Daily  . finasteride  5 mg Oral Daily  . multivitamin with minerals  1 tablet Oral Daily  . pantoprazole (PROTONIX) IV  40 mg Intravenous Q12H  . polyethylene glycol  17 g Oral Daily  . sertraline  25 mg Oral QHS  . vitamin B-12  1,000 mcg Oral Daily    Continuous Infusions: . amiodarone 30 mg/hr (06/19/20 0258)    PRN Meds: acetaminophen, hydrALAZINE, metoprolol tartrate, ondansetron (ZOFRAN) IV  Physical Exam Pulmonary:     Effort: Pulmonary effort is normal.  Neurological:     Mental Status: He is alert.             Vital Signs: BP 105/79 (BP Location: Right Arm)   Pulse 91   Temp 97.7 F (36.5 C) (Oral)   Resp 16   Ht 6' 3" (1.905 m)   Wt 92.2 kg   SpO2  97%   BMI 25.41 kg/m  SpO2: SpO2: 97 % O2 Device: O2 Device: Room Air O2 Flow Rate:    Intake/output summary:   Intake/Output Summary (Last 24 hours) at 06/19/2020 1256 Last data filed at 06/19/2020 1036 Gross per 24 hour  Intake 1602.54 ml  Output 500 ml  Net 1102.54 ml   LBM: Last BM Date: 06/19/20 Baseline Weight: Weight: 113.4 kg Most recent weight: Weight: 92.2 kg         Patient Active Problem List   Diagnosis Date Noted  . Atrial fibrillation with rapid ventricular response (HCC) 06/15/2020  . CKD (chronic kidney disease), stage IV ([facility]) 06/14/2020  . Type II diabetes mellitus with renal manifestations (H[facility]) 06/14/2020  . Depression 06/14/2020  . Tachycardia 06/14/2020  . Atrial fibrillation with RVR ([facility]) 06/14/2020  . Elevated troponin 06/14/2020  . Anxiety disorder 05/27/2020  . Diabetic renal disease ([facility]CC) 05/27/2020  . Hypothyroidism 05/27/2020  . Late effects of cerebrovascular disease 05/27/2020  . Vitamin D deficiency 05/27/2020  . Other vitamin B12 deficiency anemias 05/27/2020  . Expressive aphasia 05/27/2020  . Aortic atherosclerosis (HCC) 05/03/2020  . Port-A-Cath in place 03/19/2020  . Anemia in stage 3b chronic kidney disease (HCC) 03/19/2020  . Pre-op evaluation 01/30/2020  . Aortic valve regurgitation 01/30/2020  . Hypertension associated with diabetes (HCC) 01/30/2020  . Lower extremity edema 01/30/2020  . CKD (chronic kidney disease) stage 3, GFR 30-59 ml/min (HCC) 01/03/2020  . History of CVA (cerebrovascular accident) 01/03/2020  . Cognitive impairment 09/27/2019  . Encounter for antineoplastic chemotherapy 09/27/2019  . Goals of care, counseling/discussion 09/22/2019  . Swelling of upper arm 09/22/2019  . B12 deficiency 09/17/2019  . Palliative care by specialist   . DNR (do not resuscitate) discussion   . Rectal cancer s/p robotic LAR resectrion 05/25/2020   . Helicobacter pylori gastritis   . Benign prostatic hyperplasia without  lower urinary tract symptoms 09/07/2019  . Absolute anemia 09/07/2019  . Acute GI bleeding 09/07/2019  .   Iron deficiency anemia due to chronic blood loss 09/07/2019  . Hyperlipidemia 03/21/2018  . Weakness generalized 06/26/2015  . Dysphagia 06/26/2015  . Essential hypertension, malignant 06/26/2015  . Hypokalemia 06/26/2015  . Cerebral infarction (HCC) 06/23/2015  . Diabetes (HCC) 06/23/2015  . HTN (hypertension) 06/23/2015    Palliative Care Assessment & Plan    Recommendations/Plan:  Hospice facility.  Comfort care   Code Status:    Code Status Orders  (From admission, onward)         Start     Ordered   06/17/20 1046  Do not attempt resuscitation (DNR)  Continuous       Question Answer Comment  In the event of cardiac or respiratory ARREST Do not call a "code blue"   In the event of cardiac or respiratory ARREST Do not perform Intubation, CPR, defibrillation or ACLS   In the event of cardiac or respiratory ARREST Use medication by any route, position, wound care, and other measures to relive pain and suffering. May use oxygen, suction and manual treatment of airway obstruction as needed for comfort.      06/17/20 1045        Code Status History    Date Active Date Inactive Code Status Order ID Comments User Context   06/14/2020 1511 06/17/2020 1045 Partial Code [id number]  Niu, Xilin, MD ED   05/25/2020 1741 05/29/2020 1921 Full Code [id number]  Gross, Steven, MD Inpatient   09/07/2019 0857 09/17/2019 0221 Full Code [id number]  Niu, Xilin, MD ED   03/22/2018 0231 03/23/2018 1717 Full Code [id number]  Willis, David, MD Inpatient   06/30/2015 0244 07/03/2015 2113 Full Code [id number]  Diamond, Michael S, MD Inpatient   06/24/2015 0153 06/27/2015 1804 Full Code [id number]  Crosley, Debby, MD Inpatient   Advance Care Planning Activity    Advance Directive Documentation   Flowsheet Row Most Recent Value  Type of Advance Directive Out of facility DNR (pink MOST or yellow form)   Pre-existing out of facility DNR order (yellow form or pink MOST form) Pink MOST form placed in chart (order not valid for inpatient use)  "MOST" Form in Place? --       Prognosis:   < 2 weeks Blood loss anemia despite blood transfusions.  Finished chemoradiation for rectal cancer.  Rectosigmoid resection 05/25/2020.    Care plan was discussed with primary team, GI, hospice liaison  Thank you for allowing the Palliative Medicine Team to assist in the care of this patient.   Time In: 11:25 Time Out: 12:30 Total Time 65 min Prolonged Time Billed  no      Greater than 50%  of this time was spent counseling and coordinating care related to the above assessment and plan.   , NP  Please contact Palliative Medicine Team phone at [phone] for questions and concerns.      

## 2020-06-19 NOTE — Progress Notes (Signed)
Pt hgb 6.4 at start of shift. 1 unit of PRBC's ordered and administered. Hgb 7.2 post infusion. Pt had several very small BM's with just a scant amount of blood in each. No blood clots noted in stool. Pt alert, no complaints of pain and has a good appetite. Will continue to monitor.

## 2020-06-19 NOTE — Progress Notes (Signed)
PROGRESS NOTE    Christopher Delacruz  HLK:[id number] DOB: 23-Mar-1950 DOA: 06/14/2020 PCP: [name], MD   Brief Narrative:   70 y.o. male with medical history significant of HTN, HLD, DM, stroke with right-sided weakness and aphasia, CKD-IV, anemia, BPH, AVR, rectal CA (s/p of robotic LAR resection 05/25/20), hypothyroidism, depression with anxiety, cognitive impairment, dementia, who presents with tachycardia.  Patient has history of dementia and aphasia secondary to previous stroke, and appears to be very poor historian and is unable to provide accurate medical history, therefore, most of the history is obtained by discussing the case with ED physician, per EMS report, and with the nursing staff.  Per report, pt was found to have tachycardia with in SNF. Per EDP, pt was found to new onset A fib with RVR with HR up to 140s in ED. Pt was given 15 mg of Cardizem IV and started Cardizem drip.  Patient converted to sinus rhythm.  Discussed with cardiology.  Converting to p.o. Cardizem long acting and starting Eliquis.  Patient has a complex urologic and oncologic history.  As such he is a high risk for complications and will need close follow-up.  Overall rate controlled with occasional bursts of rapid atrial fibrillation.  Control beta-blocker.  5/22: Rate control remains challenging.  On 5/21 patient had episode of stool with maroon-colored blood and clots.  On 5/22 patient had large-volume maroon stool with clots passed per rectum.  Notified cardiology.  Eliquis has been discontinued.  Serial hemoglobins.  5/23: Continues to have maroon-colored stools with clots.  Hemoglobin continues to drop.  Remains in atrial flutter, ventricular rate approximately 100.  Blood pressure low but stable.  Status post 2 units packed red blood cells  5/24: Hemoglobin continues to drop.  6.4 this morning transfuse additional unit.  Case discussed with palliative care.  Palliative care met with patient and  patient's nephew at bedside.  Decision made to discontinue all aggressive medical care and initiate comfort measures with hospice referral.    Assessment & Plan:   Principal Problem:   Atrial fibrillation with RVR ([place]) Active Problems:   HTN (hypertension)   Benign prostatic hyperplasia without lower urinary tract symptoms   Acute GI bleeding   Iron deficiency anemia due to chronic blood loss   Rectal cancer s/p robotic LAR resectrion 05/25/2020   History of CVA (cerebrovascular accident)   Hypothyroidism   CKD (chronic kidney disease), stage IV (HCC)   Type II diabetes mellitus with renal manifestations (HCC)   Depression   Elevated troponin   Atrial fibrillation with rapid ventricular response (HCC)  Rectal bleeding Patient continues to bleed After goals of care discussion with palliative care decision made to discontinue all aggressive medical intervention and proceed with comfort measures  Atrial fibrillation/Aflutter with RVR and elevated troponin Amiodarone infusion stopped after comfort measures initiated  Rectal cancer s/p robotic LAR resectrion 05/25/2020 HTN (hypertension) Benign prostatic hyperplasia without lower urinary tract symptoms Iron deficiency anemia due to chronic blood loss History of CVA (cerebrovascular accident) Hypothyroidism CKD (chronic kidney disease), stage IV ([place]) Diet controlled Type II diabetes mellitus with renal manifestations ([place]):  Depression  All medications not could focus on patient comfort and been discontinued.  Initiate comfort care order set.  DNR status.  Hospice referral.  DVT prophylaxis: SCD Code Status: DNR.  Discussed and confirmed with patient's nephew Family Communication:  nephew Mayer Vondrak [phone] on 5/24 Disposition Plan: Status is: Inpatient  Remains inpatient appropriate because:Inpatient level of care appropriate due to  severity of illness   Dispo: The patient is from: SNF              Anticipated d/c is  to: Inpatient hospice facility              Patient currently is not medically stable to d/c.   Difficult to place patient No    All medications not focused on patient comfort have been discontinued.  Initiate full comfort measures.  Hospice referral.  Discharge pending acceptance to inpatient hospice facility.   Level of care: Med-Surg  Consultants:   Cardiology-[facility]  Palliative care  Procedures:   None  Antimicrobials:   None   Subjective: Patient seen and examined.  History limited by underlying dementia and hearing loss.  No apparent distress.  Continues to bleed  Objective: Vitals:   06/19/20 0736 06/19/20 0809 06/19/20 0824 06/19/20 1036  BP: (!) 134/92 (!) 134/92 122/68 105/79  Pulse: 100 100 (!) 101 91  Resp: 16 16 16 16   Temp: 98 F (36.7 C) 98 F (36.7 C) 98.6 F (37 C) 97.7 F (36.5 C)  TempSrc:  Oral Oral Oral  SpO2: 100% 100% 97% 97%  Weight: 92.2 kg     Height:        Intake/Output Summary (Last 24 hours) at 06/19/2020 1403 Last data filed at 06/19/2020 1036 Gross per 24 hour  Intake 1362.54 ml  Output 500 ml  Net 862.54 ml   Filed Weights   06/18/20 0447 06/18/20 0738 06/19/20 0736  Weight: 92.8 kg 91.9 kg 92.2 kg    Examination:  General exam: No acute distress.  Appears chronically ill Respiratory system: Poor respiratory effort.  Lungs clear.  Room air Cardiovascular system: S1-S2, regular rhythm, no murmurs Gastrointestinal system: Nontender, nondistended, normal bowel sounds  Central nervous system: Alert, oriented x1, no focal deficits extremities: Decreased power symmetrically Skin: No rashes, lesions or ulcers Psychiatry: Judgement and insight appear impaired. Mood & affect flattened.     Data Reviewed: I have personally reviewed following labs and imaging studies  CBC: Recent Labs  Lab 06/15/20 0344 06/16/20 0421 06/17/20 0155 06/17/20 0813 06/18/20 0426 06/18/20 1202 06/18/20 1705 06/18/20 2036 06/19/20 0228  06/19/20 1143  WBC 8.7 9.5 12.0*  --  12.7*  --   --   --  12.2*  --   NEUTROABS  --   --  9.6*  --  10.2*  --   --   --  10.1*  --   HGB 8.9* 9.3* 8.6*   < > 6.2* 7.0* 7.6* 6.9* 6.4* 7.2*  HCT 28.0* 29.5* 26.2*  --  19.2*  --   --   --  19.2*  --   MCV 87.0 87.3 86.8  --  86.1  --   --   --  85.0  --   PLT 466* 413* 397  --  283  --   --   --  200  --    < > = values in this interval not displayed.   Basic Metabolic Panel: Recent Labs  Lab 06/14/20 1220 06/15/20 0344 06/16/20 0730 06/17/20 0155 06/18/20 0426 06/19/20 0228  NA 138 139 140 138 138 135  K 4.0 3.6 4.1 4.1 4.7 4.5  CL 102 107 107 105 105 103  CO2 24 23 24 25 22 22   GLUCOSE 174* 144* 129* 160* 188* 175*  BUN 42* 40* 38* 38* 51* 61*  CREATININE 2.41* 2.18* 2.17* 2.26* 3.15* 3.91*  CALCIUM 9.3 8.7* 8.9  8.8* 8.3* 8.2*  MG 2.5*  --  2.4 2.3 2.2 2.3   GFR: Estimated Creatinine Clearance: 20.7 mL/min (A) (by C-G formula based on SCr of 3.91 mg/dL (H)). Liver Function Tests: No results for input(s): AST, ALT, ALKPHOS, BILITOT, PROT, ALBUMIN in the last 168 hours. No results for input(s): LIPASE, AMYLASE in the last 168 hours. No results for input(s): AMMONIA in the last 168 hours. Coagulation Profile: Recent Labs  Lab 06/14/20 1220 06/18/20 0727  INR 1.2 1.3*   Cardiac Enzymes: No results for input(s): CKTOTAL, CKMB, CKMBINDEX, TROPONINI in the last 168 hours. BNP (last 3 results) No results for input(s): PROBNP in the last 8760 hours. HbA1C: No results for input(s): HGBA1C in the last 72 hours. CBG: Recent Labs  Lab 06/17/20 0753 06/18/20 0739 06/18/20 1126 06/18/20 1643 06/19/20 0736  GLUCAP 174* 189* 173* 178* 190*   Lipid Profile: No results for input(s): CHOL, HDL, LDLCALC, TRIG, CHOLHDL, LDLDIRECT in the last 72 hours. Thyroid Function Tests: No results for input(s): TSH, T4TOTAL, FREET4, T3FREE, THYROIDAB in the last 72 hours. Anemia Panel: No results for input(s): VITAMINB12, FOLATE,  FERRITIN, TIBC, IRON, RETICCTPCT in the last 72 hours. Sepsis Labs: No results for input(s): PROCALCITON, LATICACIDVEN in the last 168 hours.  Recent Results (from the past 240 hour(s))  Resp Panel by RT-PCR (Flu A&B, Covid) Nasopharyngeal Swab     Status: None   Collection Time: 06/14/20  2:40 PM   Specimen: Nasopharyngeal Swab; Nasopharyngeal(NP) swabs in vial transport medium  Result Value Ref Range Status   SARS Coronavirus 2 by RT PCR NEGATIVE NEGATIVE Final    Comment: (NOTE) SARS-CoV-2 target nucleic acids are NOT DETECTED.  The SARS-CoV-2 RNA is generally detectable in upper respiratory specimens during the acute phase of infection. The lowest concentration of SARS-CoV-2 viral copies this assay can detect is 138 copies/mL. A negative result does not preclude SARS-Cov-2 infection and should not be used as the sole basis for treatment or other patient management decisions. A negative result may occur with  improper specimen collection/handling, submission of specimen other than nasopharyngeal swab, presence of viral mutation(s) within the areas targeted by this assay, and inadequate number of viral copies(<138 copies/mL). A negative result must be combined with clinical observations, patient history, and epidemiological information. The expected result is Negative.  Fact Sheet for Patients:  EntrepreneurPulse.com.au  Fact Sheet for Healthcare Providers:  IncredibleEmployment.be  This test is no t yet approved or cleared by the [place] FDA and  has been authorized for detection and/or diagnosis of SARS-CoV-2 by FDA under an Emergency Use Authorization (EUA). This EUA will remain  in effect (meaning this test can be used) for the duration of the COVID-19 declaration under Section 564(b)(1) of the Act, 21 U.S.C.section 360bbb-3(b)(1), unless the authorization is terminated  or revoked sooner.       Influenza A by PCR NEGATIVE  NEGATIVE Final   Influenza B by PCR NEGATIVE NEGATIVE Final    Comment: (NOTE) The Xpert Xpress SARS-CoV-2/FLU/RSV plus assay is intended as an aid in the diagnosis of influenza from Nasopharyngeal swab specimens and should not be used as a sole basis for treatment. Nasal washings and aspirates are unacceptable for Xpert Xpress SARS-CoV-2/FLU/RSV testing.  Fact Sheet for Patients: EntrepreneurPulse.com.au  Fact Sheet for Healthcare Providers: IncredibleEmployment.be  This test is not yet approved or cleared by the [place] FDA and has been authorized for detection and/or diagnosis of SARS-CoV-2 by FDA under an Emergency Use Authorization (EUA). This  EUA will remain in effect (meaning this test can be used) for the duration of the COVID-19 declaration under Section 564(b)(1) of the Act, 21 U.S.C. section 360bbb-3(b)(1), unless the authorization is terminated or revoked.  Performed at [facility], [address] [place], [place] [address]          Radiology Studies: NM GI Blood Loss  Result Date: 06/18/2020 CLINICAL DATA:  Maroon-colored clot in stool.  Decreased hemoglobin. EXAM: NUCLEAR MEDICINE GASTROINTESTINAL BLEEDING SCAN TECHNIQUE: Sequential abdominal images were obtained following intravenous administration of Tc-20mlabeled red blood cells. RADIOPHARMACEUTICALS:  23.24 mCi Tc-948mertechnetate in-vitro labeled red cells. COMPARISON:  None. FINDINGS: Satisfactory uptake is present into the blood pool. Aorta and iliac vessels are well visualized. No bowel uptake is present within the first hour. Patient refused additional imaging into the second hour. IMPRESSION: 1. No evidence for GI hemorrhage. Electronically Signed   By: ChSan Morelle.D.   On: 06/18/2020 14:01        Scheduled Meds: . sodium chloride   Intravenous Once  . sodium chloride   Intravenous Once  . diltiazem  180 mg Oral Daily  . finasteride   5 mg Oral Daily  . pantoprazole (PROTONIX) IV  40 mg Intravenous Q12H  . polyethylene glycol  17 g Oral Daily  . sertraline  25 mg Oral QHS  . sodium chloride flush  3 mL Intravenous Q12H   Continuous Infusions:    LOS: 4 days    Time spent: 25 minutes    SuSidney AceMD [facility] Pager [phone]  If 7PM-7AM, please contact night-coverage 06/19/2020, 2:03 PM

## 2020-06-19 NOTE — Progress Notes (Addendum)
[place] [facility]) Hospital Liaison RN note:  Received request from Asencion Gowda, NP for family interest in [place]. Chart reviewed and eligibility was approved. Spoke with nephew, [name] to confirm interest and explain services. He verbalized understanding. Unfortunately, Hospice Home is not able to offer a room today. Hospital care team is aware. [place] Liaison will follow for room availability.  Please call with any hospice related questions or concerns.  Thank you for the opportunity to participate in this patient's care.  Zandra Abts, RN [facility] Liaison  [phone]

## 2020-06-20 ENCOUNTER — Inpatient Hospital Stay: Payer: Medicare Other | Admitting: Oncology

## 2020-06-20 DIAGNOSIS — I4891 Unspecified atrial fibrillation: Secondary | ICD-10-CM | POA: Diagnosis not present

## 2020-06-20 DIAGNOSIS — Z515 Encounter for palliative care: Secondary | ICD-10-CM | POA: Diagnosis not present

## 2020-06-20 LAB — TYPE AND SCREEN
ABO/RH(D): B NEG
Antibody Screen: NEGATIVE
Unit division: 0
Unit division: 0
Unit division: 0
Unit division: 0

## 2020-06-20 LAB — BPAM RBC
Blood Product Expiration Date: 202205262359
Blood Product Expiration Date: 202205262359
Blood Product Expiration Date: 202206032359
Blood Product Expiration Date: 202206282359
ISSUE DATE / TIME: 202205221747
ISSUE DATE / TIME: 202205230456
ISSUE DATE / TIME: 202205231305
ISSUE DATE / TIME: 202205240804
Unit Type and Rh: 1700
Unit Type and Rh: 1700
Unit Type and Rh: 9500
Unit Type and Rh: 9500

## 2020-06-20 NOTE — Progress Notes (Signed)
Report called to [place] on IC. SWAT nurse serenity to help transport patient off floor

## 2020-06-20 NOTE — Progress Notes (Signed)
Daily Progress Note   Patient Name: Christopher Delacruz       Date: 06/20/2020 DOB: 01-20-1951  Age: 70 y.o. MRN#: [id number] Attending Physician: Gwynne Edinger, MD Primary Care Physician: [name], MD Admit Date: 06/14/2020  Reason for Consultation/Follow-up: Terminal Care  Subjective: Patient is resting in bed. He appears comfortable. No distress noted. No changes to symptom management at this time.   Length of Stay: 5  Current Medications: Scheduled Meds:  . sodium chloride   Intravenous Once  . sodium chloride   Intravenous Once  . diltiazem  180 mg Oral Daily  . finasteride  5 mg Oral Daily  . pantoprazole (PROTONIX) IV  40 mg Intravenous Q12H  . polyethylene glycol  17 g Oral Daily  . sertraline  25 mg Oral QHS  . sodium chloride flush  3 mL Intravenous Q12H    Continuous Infusions:   PRN Meds: acetaminophen **OR** acetaminophen, antiseptic oral rinse, glycopyrrolate **OR** glycopyrrolate **OR** glycopyrrolate, haloperidol **OR** haloperidol **OR** haloperidol lactate, hydrALAZINE, LORazepam **OR** LORazepam **OR** LORazepam, morphine injection, ondansetron (ZOFRAN) IV, ondansetron **OR** ondansetron (ZOFRAN) IV, polyvinyl alcohol, sodium chloride flush  Physical Exam Pulmonary:     Effort: Pulmonary effort is normal.  Neurological:     Mental Status: He is alert.             Vital Signs: BP 110/77 (BP Location: Right Arm)   Pulse 93   Temp 98.4 F (36.9 C)   Resp 18   Ht 6\' 3"  (1.905 m)   Wt 93 kg   SpO2 (!) 89%   BMI 25.63 kg/m  SpO2: SpO2: (!) 89 % O2 Device: O2 Device: Room Air O2 Flow Rate:    Intake/output summary:   Intake/Output Summary (Last 24 hours) at 06/20/2020 1617 Last data filed at 06/20/2020 1416 Gross per 24 hour  Intake 600 ml   Output --  Net 600 ml   LBM: Last BM Date: 06/20/20 Baseline Weight: Weight: 113.4 kg Most recent weight: Weight: 93 kg        Patient Active Problem List   Diagnosis Date Noted  . Atrial fibrillation with rapid ventricular response ([place]) 06/15/2020  . CKD (chronic kidney disease), stage IV ([place]) 06/14/2020  . Type II diabetes mellitus with renal manifestations ([place]) 06/14/2020  . Depression 06/14/2020  . Tachycardia  06/14/2020  . Atrial fibrillation with RVR ([place]) 06/14/2020  . Elevated troponin 06/14/2020  . Anxiety disorder 05/27/2020  . Diabetic renal disease ([place]) 05/27/2020  . Hypothyroidism 05/27/2020  . Late effects of cerebrovascular disease 05/27/2020  . Vitamin D deficiency 05/27/2020  . Other vitamin B12 deficiency anemias 05/27/2020  . Expressive aphasia 05/27/2020  . Aortic atherosclerosis ([place]) 05/03/2020  . Port-A-Cath in place 03/19/2020  . Anemia in stage 3b chronic kidney disease ([place]) 03/19/2020  . Pre-op evaluation 01/30/2020  . Aortic valve regurgitation 01/30/2020  . Hypertension associated with diabetes ([place]) 01/30/2020  . Lower extremity edema 01/30/2020  . CKD (chronic kidney disease) stage 3, GFR 30-59 ml/min (HCC) 01/03/2020  . History of CVA (cerebrovascular accident) 01/03/2020  . Cognitive impairment 09/27/2019  . Encounter for antineoplastic chemotherapy 09/27/2019  . Goals of care, counseling/discussion 09/22/2019  . Swelling of upper arm 09/22/2019  . B12 deficiency 09/17/2019  . Palliative care by specialist   . DNR (do not resuscitate) discussion   . Rectal cancer s/p robotic LAR resectrion 05/25/2020   . Helicobacter pylori gastritis   . Benign prostatic hyperplasia without lower urinary tract symptoms 09/07/2019  . Absolute anemia 09/07/2019  . Acute GI bleeding 09/07/2019  . Iron deficiency anemia due to chronic blood loss 09/07/2019  . Hyperlipidemia 03/21/2018  . Weakness generalized 06/26/2015  . Dysphagia 06/26/2015  .  Essential hypertension, malignant 06/26/2015  . Hypokalemia 06/26/2015  . Cerebral infarction ([place]) 06/23/2015  . Diabetes ([place]) 06/23/2015  . HTN (hypertension) 06/23/2015    Palliative Care Assessment & Plan     Recommendations/Plan:  Waiting for hospice bed. Comfortable. No changes to symptom management at this time.     Code Status:    Code Status Orders  (From admission, onward)         Start     Ordered   06/19/20 1306  Do not attempt resuscitation (DNR)  Continuous       Question Answer Comment  In the event of cardiac or respiratory ARREST Do not call a "code blue"   In the event of cardiac or respiratory ARREST Do not perform Intubation, CPR, defibrillation or ACLS   In the event of cardiac or respiratory ARREST Use medication by any route, position, wound care, and other measures to relive pain and suffering. May use oxygen, suction and manual treatment of airway obstruction as needed for comfort.   Comments MOST fomr on chart.      06/19/20 1306        Code Status History    Date Active Date Inactive Code Status Order ID Comments User Context   06/17/2020 1045 06/19/2020 1306 DNR [id number]  Sidney Ace, MD Inpatient   06/14/2020 1511 06/17/2020 1045 Partial Code [id number]  Ivor Costa, MD ED   05/25/2020 1741 05/29/2020 1921 Full Code [id number]  [name], MD Inpatient   09/07/2019 0857 09/17/2019 0221 Full Code [id number]  Ivor Costa, MD ED   03/22/2018 0231 03/23/2018 1717 Full Code [id number]  Lance Coon, MD Inpatient   06/30/2015 0244 07/03/2015 2113 Full Code [id number]  Harrie Foreman, MD Inpatient   06/24/2015 0153 06/27/2015 1804 Full Code [id number]  Quintella Baton, MD Inpatient   Advance Care Planning Activity    Advance Directive Documentation   Flowsheet Row Most Recent Value  Type of Advance Directive Out of facility DNR (pink MOST or yellow form)  Pre-existing out of facility DNR order (yellow form or pink MOST form) Pink MOST form placed in  chart (order not valid for inpatient use)  "MOST" Form in Place? --       Prognosis:   < 2 weeks    Thank you for allowing the Palliative Medicine Team to assist in the care of this patient.   Total Time 15 min Prolonged Time Billed  no      Greater than 50%  of this time was spent counseling and coordinating care related to the above assessment and plan.  Asencion Gowda, NP  Please contact Palliative Medicine Team phone at [phone] for questions and concerns.

## 2020-06-20 NOTE — Progress Notes (Addendum)
[facility]) Hospital Liaison RN note:   Visited patient and family at bedside. Spoke with nephew, [name] in the room to provide update. Unfortunately, Hospice Home is not able to offer a room today. Hospital care team is aware. [place] Liaison will continue to monitor for room availability.  Please call with any hospice related questions or concerns.  Zandra Abts, RN [facility] Liaison [phone]

## 2020-06-20 NOTE — Progress Notes (Signed)
PROGRESS NOTE    Christopher Delacruz  CBJ:[id number] DOB: [birth year] DOA: 06/14/2020 PCP: [name], MD   Brief Narrative:   70 y.o. male with medical history significant of HTN, HLD, DM, stroke with right-sided weakness and aphasia, CKD-IV, anemia, BPH, AVR, rectal CA (s/p of robotic LAR resection 05/25/20), hypothyroidism, depression with anxiety, cognitive impairment, dementia, who presents with tachycardia.  Patient has history of dementia and aphasia secondary to previous stroke, and appears to be very poor historian and is unable to provide accurate medical history, therefore, most of the history is obtained by discussing the case with ED physician, per EMS report, and with the nursing staff.  Per report, pt was found to have tachycardia with in SNF. Per EDP, pt was found to new onset A fib with RVR with HR up to 140s in ED. Pt was given 15 mg of Cardizem IV and started Cardizem drip.  Patient converted to sinus rhythm.  Discussed with cardiology.  Converting to p.o. Cardizem long acting and starting Eliquis.  Patient has a complex urologic and oncologic history.  As such he is a high risk for complications and will need close follow-up.  Overall rate controlled with occasional bursts of rapid atrial fibrillation.  Control beta-blocker.  5/22: Rate control remains challenging.  On 5/21 patient had episode of stool with maroon-colored blood and clots.  On 5/22 patient had large-volume maroon stool with clots passed per rectum.  Notified cardiology.  Eliquis has been discontinued.  Serial hemoglobins.  5/23: Continues to have maroon-colored stools with clots.  Hemoglobin continues to drop.  Remains in atrial flutter, ventricular rate approximately 100.  Blood pressure low but stable.  Status post 2 units packed red blood cells  5/24: Hemoglobin continues to drop.  6.4 this morning transfuse additional unit.  Case discussed with palliative care.  Palliative care met with patient and  patient's nephew at bedside.  Decision made to discontinue all aggressive medical care and initiate comfort measures with hospice referral.    Assessment & Plan:   Principal Problem:   Atrial fibrillation with RVR ([place]) Active Problems:   HTN (hypertension)   Benign prostatic hyperplasia without lower urinary tract symptoms   Acute GI bleeding   Iron deficiency anemia due to chronic blood loss   Rectal cancer s/p robotic LAR resectrion 05/25/2020   History of CVA (cerebrovascular accident)   Hypothyroidism   CKD (chronic kidney disease), stage IV (HCC)   Type II diabetes mellitus with renal manifestations (HCC)   Depression   Elevated troponin   Atrial fibrillation with rapid ventricular response (HCC)  Rectal bleeding Patient continues to bleed After goals of care discussion with palliative care decision made to discontinue all aggressive medical intervention and proceed with comfort measures  Atrial fibrillation/Aflutter with RVR and elevated troponin Amiodarone infusion stopped after comfort measures initiated  Rectal cancer s/p robotic LAR resectrion 05/25/2020 HTN (hypertension) Benign prostatic hyperplasia without lower urinary tract symptoms Iron deficiency anemia due to chronic blood loss History of CVA (cerebrovascular accident) Hypothyroidism CKD (chronic kidney disease), stage IV ([place]) Diet controlled Type II diabetes mellitus with renal manifestations ([place]):  Depression  All medications not could focus on patient comfort and been discontinued.  Initiate comfort care order set.  DNR status.  Hospice referral.  DVT prophylaxis: SCD Code Status: DNR.   Family Communication:  Family including nephew hcpoa mark updated @ bedside 5/25 Disposition Plan: Status is: Inpatient  Remains inpatient appropriate because:Inpatient level of care appropriate due to severity of  illness   Dispo: The patient is from: SNF              Anticipated d/c is to: Inpatient hospice  facility              Patient currently is not medically stable to d/c.   Difficult to place patient No    All medications not focused on patient comfort have been discontinued.  Initiate full comfort measures.  Hospice referral.  Discharge pending acceptance to inpatient hospice facility.   Level of care: Med-Surg  Consultants:   Cardiology-[facility]  Palliative care  Procedures:   None  Antimicrobials:   None   Subjective: Patient seen and examined.  History limited by underlying dementia and hearing loss.  No apparent distress.  No pain.  Objective: Vitals:   06/19/20 1036 06/19/20 1925 06/20/20 0900 06/20/20 1107  BP: 105/79 119/63  113/63  Pulse: 91 (!) 49  91  Resp: 16 19  17   Temp: 97.7 F (36.5 C) 98.3 F (36.8 C)  98.4 F (36.9 C)  TempSrc: Oral Oral  Oral  SpO2: 97% 96%  96%  Weight:   93 kg   Height:        Intake/Output Summary (Last 24 hours) at 06/20/2020 1316 Last data filed at 06/20/2020 1029 Gross per 24 hour  Intake 600 ml  Output --  Net 600 ml   Filed Weights   06/18/20 0738 06/19/20 0736 06/20/20 0900  Weight: 91.9 kg 92.2 kg 93 kg    Examination:  General exam: No acute distress.  Appears chronically ill Respiratory system: Poor respiratory effort.  Lungs clear.  Room air Cardiovascular system: S1-S2, regular rhythm, no murmurs Gastrointestinal system: Nontender, nondistended, normal bowel sounds  Central nervous system: Alert, oriented x1, no focal deficits extremities: Decreased power symmetrically Skin: No rashes, lesions or ulcers Psychiatry: Judgement and insight appear impaired. Mood & affect flattened.     Data Reviewed: I have personally reviewed following labs and imaging studies  CBC: Recent Labs  Lab 06/15/20 0344 06/16/20 0421 06/17/20 0155 06/17/20 0813 06/18/20 0426 06/18/20 1202 06/18/20 1705 06/18/20 2036 06/19/20 0228 06/19/20 1143  WBC 8.7 9.5 12.0*  --  12.7*  --   --   --  12.2*  --   NEUTROABS  --    --  9.6*  --  10.2*  --   --   --  10.1*  --   HGB 8.9* 9.3* 8.6*   < > 6.2* 7.0* 7.6* 6.9* 6.4* 7.2*  HCT 28.0* 29.5* 26.2*  --  19.2*  --   --   --  19.2*  --   MCV 87.0 87.3 86.8  --  86.1  --   --   --  85.0  --   PLT 466* 413* 397  --  283  --   --   --  200  --    < > = values in this interval not displayed.   Basic Metabolic Panel: Recent Labs  Lab 06/14/20 1220 06/15/20 0344 06/16/20 0730 06/17/20 0155 06/18/20 0426 06/19/20 0228  NA 138 139 140 138 138 135  K 4.0 3.6 4.1 4.1 4.7 4.5  CL 102 107 107 105 105 103  CO2 24 23 24 25 22 22   GLUCOSE 174* 144* 129* 160* 188* 175*  BUN 42* 40* 38* 38* 51* 61*  CREATININE 2.41* 2.18* 2.17* 2.26* 3.15* 3.91*  CALCIUM 9.3 8.7* 8.9 8.8* 8.3* 8.2*  MG 2.5*  --  2.4 2.3 2.2 2.3   GFR: Estimated Creatinine Clearance: 20.7 mL/min (A) (by C-G formula based on SCr of 3.91 mg/dL (H)). Liver Function Tests: No results for input(s): AST, ALT, ALKPHOS, BILITOT, PROT, ALBUMIN in the last 168 hours. No results for input(s): LIPASE, AMYLASE in the last 168 hours. No results for input(s): AMMONIA in the last 168 hours. Coagulation Profile: Recent Labs  Lab 06/14/20 1220 06/18/20 0727  INR 1.2 1.3*   Cardiac Enzymes: No results for input(s): CKTOTAL, CKMB, CKMBINDEX, TROPONINI in the last 168 hours. BNP (last 3 results) No results for input(s): PROBNP in the last 8760 hours. HbA1C: No results for input(s): HGBA1C in the last 72 hours. CBG: Recent Labs  Lab 06/17/20 0753 06/18/20 0739 06/18/20 1126 06/18/20 1643 06/19/20 0736  GLUCAP 174* 189* 173* 178* 190*   Lipid Profile: No results for input(s): CHOL, HDL, LDLCALC, TRIG, CHOLHDL, LDLDIRECT in the last 72 hours. Thyroid Function Tests: No results for input(s): TSH, T4TOTAL, FREET4, T3FREE, THYROIDAB in the last 72 hours. Anemia Panel: No results for input(s): VITAMINB12, FOLATE, FERRITIN, TIBC, IRON, RETICCTPCT in the last 72 hours. Sepsis Labs: No results for input(s):  PROCALCITON, LATICACIDVEN in the last 168 hours.  Recent Results (from the past 240 hour(s))  Resp Panel by RT-PCR (Flu A&B, Covid) Nasopharyngeal Swab     Status: None   Collection Time: 06/14/20  2:40 PM   Specimen: Nasopharyngeal Swab; Nasopharyngeal(NP) swabs in vial transport medium  Result Value Ref Range Status   SARS Coronavirus 2 by RT PCR NEGATIVE NEGATIVE Final    Comment: (NOTE) SARS-CoV-2 target nucleic acids are NOT DETECTED.  The SARS-CoV-2 RNA is generally detectable in upper respiratory specimens during the acute phase of infection. The lowest concentration of SARS-CoV-2 viral copies this assay can detect is 138 copies/mL. A negative result does not preclude SARS-Cov-2 infection and should not be used as the sole basis for treatment or other patient management decisions. A negative result may occur with  improper specimen collection/handling, submission of specimen other than nasopharyngeal swab, presence of viral mutation(s) within the areas targeted by this assay, and inadequate number of viral copies(<138 copies/mL). A negative result must be combined with clinical observations, patient history, and epidemiological information. The expected result is Negative.  Fact Sheet for Patients:  EntrepreneurPulse.com.au  Fact Sheet for Healthcare Providers:  IncredibleEmployment.be  This test is no t yet approved or cleared by the [place] FDA and  has been authorized for detection and/or diagnosis of SARS-CoV-2 by FDA under an Emergency Use Authorization (EUA). This EUA will remain  in effect (meaning this test can be used) for the duration of the COVID-19 declaration under Section 564(b)(1) of the Act, 21 U.S.C.section 360bbb-3(b)(1), unless the authorization is terminated  or revoked sooner.       Influenza A by PCR NEGATIVE NEGATIVE Final   Influenza B by PCR NEGATIVE NEGATIVE Final    Comment: (NOTE) The Xpert Xpress  SARS-CoV-2/FLU/RSV plus assay is intended as an aid in the diagnosis of influenza from Nasopharyngeal swab specimens and should not be used as a sole basis for treatment. Nasal washings and aspirates are unacceptable for Xpert Xpress SARS-CoV-2/FLU/RSV testing.  Fact Sheet for Patients: EntrepreneurPulse.com.au  Fact Sheet for Healthcare Providers: IncredibleEmployment.be  This test is not yet approved or cleared by the [place] FDA and has been authorized for detection and/or diagnosis of SARS-CoV-2 by FDA under an Emergency Use Authorization (EUA). This EUA will remain in effect (meaning this test can  be used) for the duration of the COVID-19 declaration under Section 564(b)(1) of the Act, 21 U.S.C. section 360bbb-3(b)(1), unless the authorization is terminated or revoked.  Performed at [facility], [address] [place], [place] [address]          Radiology Studies: No results found.      Scheduled Meds: . sodium chloride   Intravenous Once  . sodium chloride   Intravenous Once  . diltiazem  180 mg Oral Daily  . finasteride  5 mg Oral Daily  . pantoprazole (PROTONIX) IV  40 mg Intravenous Q12H  . polyethylene glycol  17 g Oral Daily  . sertraline  25 mg Oral QHS  . sodium chloride flush  3 mL Intravenous Q12H   Continuous Infusions:    LOS: 5 days    Time spent: 20 minutes    Desma Maxim, MD [facility]  If 7PM-7AM, please contact night-coverage 06/20/2020, 1:16 PM

## 2020-06-20 NOTE — Plan of Care (Signed)
  Problem: Education: Goal: Knowledge of General Education information will improve Description Including pain rating scale, medication(s)/side effects and non-pharmacologic comfort measures Outcome: Progressing   

## 2020-06-20 NOTE — Progress Notes (Signed)
Attempted to call report to [employer] on IC. Nurse currently in dressing change with another patient. Will call back in about 15 minutes

## 2020-06-20 NOTE — TOC Progression Note (Signed)
Transition of Care [facility]) - Progression Note    Patient Details  Name: Sacha Radloff MRN: [id number] Date of Birth: Mar 20, 1950  Transition of [facility]) CM/SW Contact  Shelbie Hutching, RN Phone Number: 06/20/2020, 12:37 PM  Clinical Narrative:    Patient's family has decided on comfort measures and residential hospice.  Kieth Brightly received referral for residential hospice yesterday.  No hospice bed today.    Expected Discharge Plan: [place] Barriers to Discharge: Hospice Bed not available  Expected Discharge Plan and Services Expected Discharge Plan: [place] In-house Referral: Clinical Social Work   Post Acute Care Choice: Hospice Living arrangements for the past 2 months: [place]: NA           Social Determinants of Health (SDOH) Interventions    Readmission Risk Interventions Readmission Risk Prevention Plan 05/28/2020  Transportation Screening Complete  HRI or Home Care Consult Complete  Social Work Consult for [place] Planning/Counseling Complete  Palliative Care Screening Not Applicable  Medication Review [employer]) Complete  Some recent data might be hidden

## 2020-06-21 DIAGNOSIS — I4891 Unspecified atrial fibrillation: Secondary | ICD-10-CM | POA: Diagnosis not present

## 2020-06-21 NOTE — Care Management Important Message (Signed)
Important Message  Patient Details  Name: Christopher Delacruz MRN: [id number] Date of Birth: [birth year]   Medicare Important Message Given:  Other (see comment)  Patient is on [place] and waiting for a bed at the [facility]. Out of respect for the patient and family no Important Message from [facility] given.  Juliann Pulse A Kendal Raffo 06/21/2020, 8:23 AM

## 2020-06-21 NOTE — Progress Notes (Signed)
   06/17/20 1322  Assess: MEWS Score  Temp 98 F (36.7 C)  BP 93/73  Resp 18  Level of Consciousness Alert  SpO2 96 %  O2 Device Room Air  Assess: MEWS Score  MEWS Temp 0  MEWS Systolic 1  MEWS Pulse 3  MEWS RR 0  MEWS LOC 0  MEWS Score 4  MEWS Score Color Red  Assess: if the MEWS score is Yellow or Red  Were vital signs taken at a resting state? Yes  Focused Assessment No change from prior assessment  Early Detection of Sepsis Score *See Row Information* Medium  MEWS guidelines implemented *See Row Information* No, other (Comment) (MD notified, Amio drip started)  Treat  MEWS Interventions Administered prn meds/treatments (MD was notified)  Pain Scale 0-10  Pain Score 0  Notify: Charge Nurse/RN  Name of Charge Nurse/RN Notified Erica, RN  Date Charge Nurse/RN Notified 06/17/20  Time Charge Nurse/RN Notified 1330  Notify: Provider  Provider Name/Title Priscella Mann, MD  Date Provider Notified 06/21/20  Time Provider Notified 1013  Notification Type Page  Notification Reason Critical result  Provider response See new orders  Date of Provider Response 06/17/20  Document  Patient Outcome Not stable and remains on department  Progress note created (see row info) Yes    06/17/20 1322  Assess: MEWS Score  Temp 98 F (36.7 C)  BP 93/73  Resp 18  Level of Consciousness Alert  SpO2 96 %  O2 Device Room Air  Assess: MEWS Score  MEWS Temp 0  MEWS Systolic 1  MEWS Pulse 3  MEWS RR 0  MEWS LOC 0  MEWS Score 4  MEWS Score Color Red  Assess: if the MEWS score is Yellow or Red  Were vital signs taken at a resting state? Yes  Focused Assessment No change from prior assessment  Early Detection of Sepsis Score *See Row Information* Medium  MEWS guidelines implemented *See Row Information* No, other (Comment) (MD notified, Amio drip started)  Treat  MEWS Interventions Administered prn meds/treatments (MD was notified)  Pain Scale 0-10  Pain Score 0  Notify: Charge  Nurse/RN  Name of Charge Nurse/RN Notified [place], RN  Date Charge Nurse/RN Notified 06/17/20  Time Charge Nurse/RN Notified 1330  Notify: Provider  Provider Name/Title Priscella Mann, MD  Date Provider Notified 06/21/20  Time Provider Notified 1013  Notification Type Page  Notification Reason Critical result  Provider response See new orders  Date of Provider Response 06/17/20  Document  Patient Outcome Not stable and remains on department  Progress note created (see row info) Yes

## 2020-06-21 NOTE — Plan of Care (Signed)
Pt Aox2, expressive aphasia. No c/o pain overnight. Several loose/black stools overnight, incontinence care provided. Lorazepam given to aid with sleep. Fall/safety precautions in place, rounding performed, needs/concerns addressed.  Problem: Education: Goal: Knowledge of General Education information will improve Description: Including pain rating scale, medication(s)/side effects and non-pharmacologic comfort measures Outcome: Progressing   Problem: Health Behavior/Discharge Planning: Goal: Ability to manage health-related needs will improve Outcome: Progressing   Problem: Clinical Measurements: Goal: Ability to maintain clinical measurements within normal limits will improve Outcome: Progressing Goal: Will remain free from infection Outcome: Progressing Goal: Diagnostic test results will improve Outcome: Progressing Goal: Respiratory complications will improve Outcome: Progressing Goal: Cardiovascular complication will be avoided Outcome: Progressing   Problem: Activity: Goal: Risk for activity intolerance will decrease Outcome: Progressing   Problem: Nutrition: Goal: Adequate nutrition will be maintained Outcome: Progressing   Problem: Coping: Goal: Level of anxiety will decrease Outcome: Progressing   Problem: Elimination: Goal: Will not experience complications related to bowel motility Outcome: Progressing Goal: Will not experience complications related to urinary retention Outcome: Progressing   Problem: Pain Managment: Goal: General experience of comfort will improve Outcome: Progressing   Problem: Safety: Goal: Ability to remain free from injury will improve Outcome: Progressing   Problem: Skin Integrity: Goal: Risk for impaired skin integrity will decrease Outcome: Progressing

## 2020-06-21 NOTE — Progress Notes (Signed)
PROGRESS NOTE    Christopher Delacruz  WUJ:[id number] DOB: May 09, 1950 DOA: 06/14/2020 PCP: [name], MD   Brief Narrative:   70 y.o. male with medical history significant of HTN, HLD, DM, stroke with right-sided weakness and aphasia, CKD-IV, anemia, BPH, AVR, rectal CA (s/p of robotic LAR resection 05/25/20), hypothyroidism, depression with anxiety, cognitive impairment, dementia, who presents with tachycardia.  Patient has history of dementia and aphasia secondary to previous stroke, and appears to be very poor historian and is unable to provide accurate medical history, therefore, most of the history is obtained by discussing the case with ED physician, per EMS report, and with the nursing staff.  Per report, pt was found to have tachycardia with in SNF. Per EDP, pt was found to new onset A fib with RVR with HR up to 140s in ED. Pt was given 15 mg of Cardizem IV and started Cardizem drip.  Patient converted to sinus rhythm.  Discussed with cardiology.  Converting to p.o. Cardizem long acting and starting Eliquis.  Patient has a complex urologic and oncologic history.  As such he is a high risk for complications and will need close follow-up.  Overall rate controlled with occasional bursts of rapid atrial fibrillation.  Control beta-blocker.  5/22: Rate control remains challenging.  On 5/21 patient had episode of stool with maroon-colored blood and clots.  On 5/22 patient had large-volume maroon stool with clots passed per rectum.  Notified cardiology.  Eliquis has been discontinued.  Serial hemoglobins.  5/23: Continues to have maroon-colored stools with clots.  Hemoglobin continues to drop.  Remains in atrial flutter, ventricular rate approximately 100.  Blood pressure low but stable.  Status post 2 units packed red blood cells  5/24: Hemoglobin continues to drop.  6.4 this morning transfuse additional unit.  Case discussed with palliative care.  Palliative care met with patient and  patient's nephew at bedside.  Decision made to discontinue all aggressive medical care and initiate comfort measures with hospice referral.    Assessment & Plan:   Principal Problem:   Atrial fibrillation with RVR ([place]) Active Problems:   HTN (hypertension)   Benign prostatic hyperplasia without lower urinary tract symptoms   Acute GI bleeding   Iron deficiency anemia due to chronic blood loss   Rectal cancer s/p robotic LAR resectrion 05/25/2020   History of CVA (cerebrovascular accident)   Hypothyroidism   CKD (chronic kidney disease), stage IV (HCC)   Type II diabetes mellitus with renal manifestations ([place])   Depression   Elevated troponin   Atrial fibrillation with rapid ventricular response (HCC)  Rectal bleeding After goals of care discussion with palliative care decision made to discontinue all aggressive medical intervention and proceed with comfort measures. Remains hemodynamically stable  Atrial fibrillation/Aflutter with RVR and elevated troponin Amiodarone infusion stopped after comfort measures initiated  Rectal cancer s/p robotic LAR resectrion 05/25/2020 HTN (hypertension) Benign prostatic hyperplasia without lower urinary tract symptoms Iron deficiency anemia due to chronic blood loss History of CVA (cerebrovascular accident) Hypothyroidism CKD (chronic kidney disease), stage IV (HCC) Diet controlled Type II diabetes mellitus with renal manifestations [facility]):  Depression  All medications not could focus on patient comfort and been discontinued.  Initiate comfort care order set.  DNR status.  Hospice referral. No beds today  DVT prophylaxis: SCD Code Status: DNR.   Family Communication:  Family including nephew hcpoa mark updated @ bedside 5/25 Disposition Plan: Status is: Inpatient  Remains inpatient appropriate because:Inpatient level of care appropriate due to  severity of illness   Dispo: The patient is from: SNF              Anticipated d/c is to:  Inpatient hospice facility              Patient currently is not medically stable to d/c.   Difficult to place patient No    All medications not focused on patient comfort have been discontinued.  Initiate full comfort measures.  Hospice referral.  Discharge pending acceptance to inpatient hospice facility.   Level of care: Med-Surg  Consultants:   Cardiology-[facility]  Palliative care  Procedures:   None  Antimicrobials:   None   Subjective: Patient seen and examined.  History limited by underlying dementia and hearing loss.  Tolerating some diet. Alert. Denies pain.  Objective: Vitals:   06/20/20 1554 06/21/20 0329 06/21/20 0329 06/21/20 0714  BP: 110/77 125/72 125/72 111/75  Pulse: 93 67 89 (!) 109  Resp: 18 16 16 15  Temp: 98.4 F (36.9 C) 97.9 F (36.6 C) 97.9 F (36.6 C) 98.5 F (36.9 C)  TempSrc:  Oral Oral Oral  SpO2: (!) 89% 94% 95% 97%  Weight:      Height:        Intake/Output Summary (Last 24 hours) at 06/21/2020 1326 Last data filed at 06/21/2020 1003 Gross per 24 hour  Intake 100 ml  Output --  Net 100 ml   Filed Weights   06/18/20 0738 06/19/20 0736 06/20/20 0900  Weight: 91.9 kg 92.2 kg 93 kg    Examination:  General exam: No acute distress.  Appears chronically ill Respiratory system: Poor respiratory effort.  Lungs clear.  Room air Cardiovascular system: S1-S2, regular rhythm, no murmurs Gastrointestinal system: Nontender, nondistended, normal bowel sounds  Central nervous system: Alert, oriented x1, no focal deficits  extremities: Decreased power symmetrically Skin: No rashes, lesions or ulcers Psychiatry: Judgement and insight appear impaired. Mood & affect flattened.     Data Reviewed: I have personally reviewed following labs and imaging studies  CBC: Recent Labs  Lab 06/15/20 0344 06/16/20 0421 06/17/20 0155 06/17/20 0813 06/18/20 0426 06/18/20 1202 06/18/20 1705 06/18/20 2036 06/19/20 0228 06/19/20 1143  WBC 8.7 9.5  12.0*  --  12.7*  --   --   --  12.2*  --   NEUTROABS  --   --  9.6*  --  10.2*  --   --   --  10.1*  --   HGB 8.9* 9.3* 8.6*   < > 6.2* 7.0* 7.6* 6.9* 6.4* 7.2*  HCT 28.0* 29.5* 26.2*  --  19.2*  --   --   --  19.2*  --   MCV 87.0 87.3 86.8  --  86.1  --   --   --  85.0  --   PLT 466* 413* 397  --  283  --   --   --  200  --    < > = values in this interval not displayed.   Basic Metabolic Panel: Recent Labs  Lab 06/15/20 0344 06/16/20 0730 06/17/20 0155 06/18/20 0426 06/19/20 0228  NA 139 140 138 138 135  K 3.6 4.1 4.1 4.7 4.5  CL 107 107 105 105 103  CO2 23 24 25 22 22  GLUCOSE 144* 129* 160* 188* 175*  BUN 40* 38* 38* 51* 61*  CREATININE 2.18* 2.17* 2.26* 3.15* 3.91*  CALCIUM 8.7* 8.9 8.8* 8.3* 8.2*  MG  --  2.4 2.3 2.2 2.3     GFR: Estimated Creatinine Clearance: 20.7 mL/min (A) (by C-G formula based on SCr of 3.91 mg/dL (H)). Liver Function Tests: No results for input(s): AST, ALT, ALKPHOS, BILITOT, PROT, ALBUMIN in the last 168 hours. No results for input(s): LIPASE, AMYLASE in the last 168 hours. No results for input(s): AMMONIA in the last 168 hours. Coagulation Profile: Recent Labs  Lab 06/18/20 0727  INR 1.3*   Cardiac Enzymes: No results for input(s): CKTOTAL, CKMB, CKMBINDEX, TROPONINI in the last 168 hours. BNP (last 3 results) No results for input(s): PROBNP in the last 8760 hours. HbA1C: No results for input(s): HGBA1C in the last 72 hours. CBG: Recent Labs  Lab 06/17/20 0753 06/18/20 0739 06/18/20 1126 06/18/20 1643 06/19/20 0736  GLUCAP 174* 189* 173* 178* 190*   Lipid Profile: No results for input(s): CHOL, HDL, LDLCALC, TRIG, CHOLHDL, LDLDIRECT in the last 72 hours. Thyroid Function Tests: No results for input(s): TSH, T4TOTAL, FREET4, T3FREE, THYROIDAB in the last 72 hours. Anemia Panel: No results for input(s): VITAMINB12, FOLATE, FERRITIN, TIBC, IRON, RETICCTPCT in the last 72 hours. Sepsis Labs: No results for input(s): PROCALCITON,  LATICACIDVEN in the last 168 hours.  Recent Results (from the past 240 hour(s))  Resp Panel by RT-PCR (Flu A&B, Covid) Nasopharyngeal Swab     Status: None   Collection Time: 06/14/20  2:40 PM   Specimen: Nasopharyngeal Swab; Nasopharyngeal(NP) swabs in vial transport medium  Result Value Ref Range Status   SARS Coronavirus 2 by RT PCR NEGATIVE NEGATIVE Final    Comment: (NOTE) SARS-CoV-2 target nucleic acids are NOT DETECTED.  The SARS-CoV-2 RNA is generally detectable in upper respiratory specimens during the acute phase of infection. The lowest concentration of SARS-CoV-2 viral copies this assay can detect is 138 copies/mL. A negative result does not preclude SARS-Cov-2 infection and should not be used as the sole basis for treatment or other patient management decisions. A negative result may occur with  improper specimen collection/handling, submission of specimen other than nasopharyngeal swab, presence of viral mutation(s) within the areas targeted by this assay, and inadequate number of viral copies(<138 copies/mL). A negative result must be combined with clinical observations, patient history, and epidemiological information. The expected result is Negative.  Fact Sheet for Patients:  https://www.fda.gov/media/152166/download  Fact Sheet for Healthcare Providers:  https://www.fda.gov/media/152162/download  This test is no t yet approved or cleared by the [place] FDA and  has been authorized for detection and/or diagnosis of SARS-CoV-2 by FDA under an Emergency Use Authorization (EUA). This EUA will remain  in effect (meaning this test can be used) for the duration of the COVID-19 declaration under Section 564(b)(1) of the Act, 21 U.S.C.section 360bbb-3(b)(1), unless the authorization is terminated  or revoked sooner.       Influenza A by PCR NEGATIVE NEGATIVE Final   Influenza B by PCR NEGATIVE NEGATIVE Final    Comment: (NOTE) The Xpert Xpress  SARS-CoV-2/FLU/RSV plus assay is intended as an aid in the diagnosis of influenza from Nasopharyngeal swab specimens and should not be used as a sole basis for treatment. Nasal washings and aspirates are unacceptable for Xpert Xpress SARS-CoV-2/FLU/RSV testing.  Fact Sheet for Patients: https://www.fda.gov/media/152166/download  Fact Sheet for Healthcare Providers: https://www.fda.gov/media/152162/download  This test is not yet approved or cleared by the [place] FDA and has been authorized for detection and/or diagnosis of SARS-CoV-2 by FDA under an Emergency Use Authorization (EUA). This EUA will remain in effect (meaning this test can be used) for the duration of the COVID-19 declaration   under Section 564(b)(1) of the Act, 21 U.S.C. section 360bbb-3(b)(1), unless the authorization is terminated or revoked.  Performed at [facility], [address] [place], [place] [address]          Radiology Studies: No results found.      Scheduled Meds: . sodium chloride   Intravenous Once  . sodium chloride   Intravenous Once  . diltiazem  180 mg Oral Daily  . finasteride  5 mg Oral Daily  . pantoprazole (PROTONIX) IV  40 mg Intravenous Q12H  . polyethylene glycol  17 g Oral Daily  . sertraline  25 mg Oral QHS  . sodium chloride flush  3 mL Intravenous Q12H   Continuous Infusions:    LOS: 6 days    Time spent: 20 minutes    Desma Maxim, MD [facility]  If 7PM-7AM, please contact night-coverage 06/21/2020, 1:26 PM

## 2020-06-21 NOTE — Progress Notes (Signed)
[facility] ([facility])  There is not a bed at [facility] today for Mr. Christopher Delacruz.  He has been approved for residential hospice.  [facility] will update family and hospital once we have a bed open.  Venia Carbon RN, BSN, [facility] Liaison

## 2020-06-22 DIAGNOSIS — I4891 Unspecified atrial fibrillation: Secondary | ICD-10-CM | POA: Diagnosis not present

## 2020-06-22 MED ORDER — LOPERAMIDE HCL 2 MG PO CAPS: 4.0000 mg | ORAL_CAPSULE | ORAL | Status: DC | PRN

## 2020-06-22 NOTE — Plan of Care (Signed)
No events overnight. Aox4, no c/o pain or SOB. Adequate UO, x1 episode diarrhea. Fall/safety precautions in place. Lorazepam x1 PRN given to aid with sleep. Rounding performed, needs/concerns addressed during shift.   Problem: Education: Goal: Knowledge of General Education information will improve Description: Including pain rating scale, medication(s)/side effects and non-pharmacologic comfort measures Outcome: Progressing   Problem: Health Behavior/Discharge Planning: Goal: Ability to manage health-related needs will improve Outcome: Progressing   Problem: Clinical Measurements: Goal: Ability to maintain clinical measurements within normal limits will improve Outcome: Progressing Goal: Will remain free from infection Outcome: Progressing Goal: Diagnostic test results will improve Outcome: Progressing Goal: Respiratory complications will improve Outcome: Progressing Goal: Cardiovascular complication will be avoided Outcome: Progressing   Problem: Activity: Goal: Risk for activity intolerance will decrease Outcome: Progressing   Problem: Nutrition: Goal: Adequate nutrition will be maintained Outcome: Progressing   Problem: Coping: Goal: Level of anxiety will decrease Outcome: Progressing   Problem: Elimination: Goal: Will not experience complications related to bowel motility Outcome: Progressing Goal: Will not experience complications related to urinary retention Outcome: Progressing   Problem: Pain Managment: Goal: General experience of comfort will improve Outcome: Progressing   Problem: Safety: Goal: Ability to remain free from injury will improve Outcome: Progressing   Problem: Skin Integrity: Goal: Risk for impaired skin integrity will decrease Outcome: Progressing

## 2020-06-22 NOTE — Progress Notes (Addendum)
[facility])  Christopher Delacruz is approved for Hospice Home.   We do not have an open bed for him today.  [facility] will update hospital staff and family once our bed status changes.  Thank you, Venia Carbon RN, BSN, [facility] Liaison  **spoke with nephew Christopher Delacruz and updated on current plan to transfer to [facility] once a bed is open. Provided support and answered questions.

## 2020-06-22 NOTE — Progress Notes (Signed)
PROGRESS NOTE    Christopher Delacruz  WUJ:[id number] DOB: May 09, 1950 DOA: 06/14/2020 PCP: [name], MD   Brief Narrative:   70 y.o. male with medical history significant of HTN, HLD, DM, stroke with right-sided weakness and aphasia, CKD-IV, anemia, BPH, AVR, rectal CA (s/p of robotic LAR resection 05/25/20), hypothyroidism, depression with anxiety, cognitive impairment, dementia, who presents with tachycardia.  Patient has history of dementia and aphasia secondary to previous stroke, and appears to be very poor historian and is unable to provide accurate medical history, therefore, most of the history is obtained by discussing the case with ED physician, per EMS report, and with the nursing staff.  Per report, pt was found to have tachycardia with in SNF. Per EDP, pt was found to new onset A fib with RVR with HR up to 140s in ED. Pt was given 15 mg of Cardizem IV and started Cardizem drip.  Patient converted to sinus rhythm.  Discussed with cardiology.  Converting to p.o. Cardizem long acting and starting Eliquis.  Patient has a complex urologic and oncologic history.  As such he is a high risk for complications and will need close follow-up.  Overall rate controlled with occasional bursts of rapid atrial fibrillation.  Control beta-blocker.  5/22: Rate control remains challenging.  On 5/21 patient had episode of stool with maroon-colored blood and clots.  On 5/22 patient had large-volume maroon stool with clots passed per rectum.  Notified cardiology.  Eliquis has been discontinued.  Serial hemoglobins.  5/23: Continues to have maroon-colored stools with clots.  Hemoglobin continues to drop.  Remains in atrial flutter, ventricular rate approximately 100.  Blood pressure low but stable.  Status post 2 units packed red blood cells  5/24: Hemoglobin continues to drop.  6.4 this morning transfuse additional unit.  Case discussed with palliative care.  Palliative care met with patient and  patient's nephew at bedside.  Decision made to discontinue all aggressive medical care and initiate comfort measures with hospice referral.    Assessment & Plan:   Principal Problem:   Atrial fibrillation with RVR ([place]) Active Problems:   HTN (hypertension)   Benign prostatic hyperplasia without lower urinary tract symptoms   Acute GI bleeding   Iron deficiency anemia due to chronic blood loss   Rectal cancer s/p robotic LAR resectrion 05/25/2020   History of CVA (cerebrovascular accident)   Hypothyroidism   CKD (chronic kidney disease), stage IV (HCC)   Type II diabetes mellitus with renal manifestations ([place])   Depression   Elevated troponin   Atrial fibrillation with rapid ventricular response (HCC)  Rectal bleeding After goals of care discussion with palliative care decision made to discontinue all aggressive medical intervention and proceed with comfort measures. Remains hemodynamically stable  Atrial fibrillation/Aflutter with RVR and elevated troponin Amiodarone infusion stopped after comfort measures initiated  Rectal cancer s/p robotic LAR resectrion 05/25/2020 HTN (hypertension) Benign prostatic hyperplasia without lower urinary tract symptoms Iron deficiency anemia due to chronic blood loss History of CVA (cerebrovascular accident) Hypothyroidism CKD (chronic kidney disease), stage IV (HCC) Diet controlled Type II diabetes mellitus with renal manifestations [facility]):  Depression  All medications not could focus on patient comfort and been discontinued.  Initiate comfort care order set.  DNR status.  Hospice referral. No beds today  DVT prophylaxis: SCD Code Status: DNR.   Family Communication:  Family including nephew hcpoa mark updated @ bedside 5/25 Disposition Plan: Status is: Inpatient  Remains inpatient appropriate because:Inpatient level of care appropriate due to  severity of illness   Dispo: The patient is from: SNF              Anticipated d/c is to:  Inpatient hospice facility              Patient currently is not medically stable to d/c.   Difficult to place patient No    All medications not focused on patient comfort have been discontinued.  Initiate full comfort measures.  Hospice referral.  Discharge pending acceptance to inpatient hospice facility.   Level of care: Med-Surg  Consultants:   Cardiology-[facility]  Palliative care  Procedures:   None  Antimicrobials:   None   Subjective: Patient seen and examined.  History limited by underlying dementia and hearing loss.  Tolerating diet. Alert. Denies pain. In no distress. Intermittent diarrhea.  Objective: Vitals:   06/21/20 0329 06/21/20 0329 06/21/20 0714 06/22/20 0349  BP: 125/72 125/72 111/75 114/75  Pulse: 67 89 (!) 109 (!) 106  Resp: $Remo'16 16 15 20  'oCctC$ Temp: 97.9 F (36.6 C) 97.9 F (36.6 C) 98.5 F (36.9 C) 98.4 F (36.9 C)  TempSrc: Oral Oral Oral Oral  SpO2: 94% 95% 97% 96%  Weight:      Height:        Intake/Output Summary (Last 24 hours) at 06/22/2020 1321 Last data filed at 06/21/2020 2100 Gross per 24 hour  Intake 340 ml  Output 100 ml  Net 240 ml   Filed Weights   06/18/20 0738 06/19/20 0736 06/20/20 0900  Weight: 91.9 kg 92.2 kg 93 kg    Examination:  General exam: No acute distress.  Appears chronically ill Respiratory system: Poor respiratory effort.  Lungs clear.  Room air Cardiovascular system: S1-S2, regular rhythm, no murmurs Gastrointestinal system: Nontender, nondistended, normal bowel sounds  Central nervous system: Alert, oriented x1, no focal deficits  extremities: Decreased power symmetrically Skin: No rashes, lesions or ulcers Psychiatry: Judgement and insight appear impaired.     Data Reviewed: I have personally reviewed following labs and imaging studies  CBC: Recent Labs  Lab 06/16/20 0421 06/17/20 0155 06/17/20 0813 06/18/20 0426 06/18/20 1202 06/18/20 1705 06/18/20 2036 06/19/20 0228 06/19/20 1143  WBC 9.5  12.0*  --  12.7*  --   --   --  12.2*  --   NEUTROABS  --  9.6*  --  10.2*  --   --   --  10.1*  --   HGB 9.3* 8.6*   < > 6.2* 7.0* 7.6* 6.9* 6.4* 7.2*  HCT 29.5* 26.2*  --  19.2*  --   --   --  19.2*  --   MCV 87.3 86.8  --  86.1  --   --   --  85.0  --   PLT 413* 397  --  283  --   --   --  200  --    < > = values in this interval not displayed.   Basic Metabolic Panel: Recent Labs  Lab 06/16/20 0730 06/17/20 0155 06/18/20 0426 06/19/20 0228  NA 140 138 138 135  K 4.1 4.1 4.7 4.5  CL 107 105 105 103  CO2 $Re'24 25 22 22  'QyK$ GLUCOSE 129* 160* 188* 175*  BUN 38* 38* 51* 61*  CREATININE 2.17* 2.26* 3.15* 3.91*  CALCIUM 8.9 8.8* 8.3* 8.2*  MG 2.4 2.3 2.2 2.3   GFR: Estimated Creatinine Clearance: 20.7 mL/min (A) (by C-G formula based on SCr of 3.91 mg/dL (H)). Liver Function Tests: No  results for input(s): AST, ALT, ALKPHOS, BILITOT, PROT, ALBUMIN in the last 168 hours. No results for input(s): LIPASE, AMYLASE in the last 168 hours. No results for input(s): AMMONIA in the last 168 hours. Coagulation Profile: Recent Labs  Lab 06/18/20 0727  INR 1.3*   Cardiac Enzymes: No results for input(s): CKTOTAL, CKMB, CKMBINDEX, TROPONINI in the last 168 hours. BNP (last 3 results) No results for input(s): PROBNP in the last 8760 hours. HbA1C: No results for input(s): HGBA1C in the last 72 hours. CBG: Recent Labs  Lab 06/17/20 0753 06/18/20 0739 06/18/20 1126 06/18/20 1643 06/19/20 0736  GLUCAP 174* 189* 173* 178* 190*   Lipid Profile: No results for input(s): CHOL, HDL, LDLCALC, TRIG, CHOLHDL, LDLDIRECT in the last 72 hours. Thyroid Function Tests: No results for input(s): TSH, T4TOTAL, FREET4, T3FREE, THYROIDAB in the last 72 hours. Anemia Panel: No results for input(s): VITAMINB12, FOLATE, FERRITIN, TIBC, IRON, RETICCTPCT in the last 72 hours. Sepsis Labs: No results for input(s): PROCALCITON, LATICACIDVEN in the last 168 hours.  Recent Results (from the past 240 hour(s))   Resp Panel by RT-PCR (Flu A&B, Covid) Nasopharyngeal Swab     Status: None   Collection Time: 06/14/20  2:40 PM   Specimen: Nasopharyngeal Swab; Nasopharyngeal(NP) swabs in vial transport medium  Result Value Ref Range Status   SARS Coronavirus 2 by RT PCR NEGATIVE NEGATIVE Final    Comment: (NOTE) SARS-CoV-2 target nucleic acids are NOT DETECTED.  The SARS-CoV-2 RNA is generally detectable in upper respiratory specimens during the acute phase of infection. The lowest concentration of SARS-CoV-2 viral copies this assay can detect is 138 copies/mL. A negative result does not preclude SARS-Cov-2 infection and should not be used as the sole basis for treatment or other patient management decisions. A negative result may occur with  improper specimen collection/handling, submission of specimen other than nasopharyngeal swab, presence of viral mutation(s) within the areas targeted by this assay, and inadequate number of viral copies(<138 copies/mL). A negative result must be combined with clinical observations, patient history, and epidemiological information. The expected result is Negative.  Fact Sheet for Patients:  EntrepreneurPulse.com.au  Fact Sheet for Healthcare Providers:  IncredibleEmployment.be  This test is no t yet approved or cleared by the [place] FDA and  has been authorized for detection and/or diagnosis of SARS-CoV-2 by FDA under an Emergency Use Authorization (EUA). This EUA will remain  in effect (meaning this test can be used) for the duration of the COVID-19 declaration under Section 564(b)(1) of the Act, 21 U.S.C.section 360bbb-3(b)(1), unless the authorization is terminated  or revoked sooner.       Influenza A by PCR NEGATIVE NEGATIVE Final   Influenza B by PCR NEGATIVE NEGATIVE Final    Comment: (NOTE) The Xpert Xpress SARS-CoV-2/FLU/RSV plus assay is intended as an aid in the diagnosis of influenza from  Nasopharyngeal swab specimens and should not be used as a sole basis for treatment. Nasal washings and aspirates are unacceptable for Xpert Xpress SARS-CoV-2/FLU/RSV testing.  Fact Sheet for Patients: EntrepreneurPulse.com.au  Fact Sheet for Healthcare Providers: IncredibleEmployment.be  This test is not yet approved or cleared by the [place] FDA and has been authorized for detection and/or diagnosis of SARS-CoV-2 by FDA under an Emergency Use Authorization (EUA). This EUA will remain in effect (meaning this test can be used) for the duration of the COVID-19 declaration under Section 564(b)(1) of the Act, 21 U.S.C. section 360bbb-3(b)(1), unless the authorization is terminated or revoked.  Performed at [facility], [address] [place],  [address]          Radiology Studies: No results found.      Scheduled Meds: . sodium chloride   Intravenous Once  . sodium chloride   Intravenous Once  . diltiazem  180 mg Oral Daily  . finasteride  5 mg Oral Daily  . pantoprazole (PROTONIX) IV  40 mg Intravenous Q12H  . polyethylene glycol  17 g Oral Daily  . sertraline  25 mg Oral QHS  . sodium chloride flush  3 mL Intravenous Q12H   Continuous Infusions:    LOS: 7 days    Time spent: 20 minutes    Desma Maxim, MD [facility]  If 7PM-7AM, please contact night-coverage 06/22/2020, 1:21 PM

## 2020-06-23 DIAGNOSIS — I4891 Unspecified atrial fibrillation: Secondary | ICD-10-CM | POA: Diagnosis not present

## 2020-06-23 NOTE — Progress Notes (Signed)
PROGRESS NOTE    Christopher Delacruz  KPV:[id number] DOB: [birth year] DOA: 06/14/2020 PCP: [name], MD   Brief Narrative:   70 y.o. male with medical history significant of HTN, HLD, DM, stroke with right-sided weakness and aphasia, CKD-IV, anemia, BPH, AVR, rectal CA (s/p of robotic LAR resection 05/25/20), hypothyroidism, depression with anxiety, cognitive impairment, dementia, who presents with tachycardia.  Patient has history of dementia and aphasia secondary to previous stroke, and appears to be very poor historian and is unable to provide accurate medical history, therefore, most of the history is obtained by discussing the case with ED physician, per EMS report, and with the nursing staff.  Per report, pt was found to have tachycardia with in SNF. Per EDP, pt was found to new onset A fib with RVR with HR up to 140s in ED. Pt was given 15 mg of Cardizem IV and started Cardizem drip.  Patient converted to sinus rhythm.  Discussed with cardiology.  Converting to p.o. Cardizem long acting and starting Eliquis.  Patient has a complex urologic and oncologic history.  As such he is a high risk for complications and will need close follow-up.  Overall rate controlled with occasional bursts of rapid atrial fibrillation.  Control beta-blocker.  5/22: Rate control remains challenging.  On 5/21 patient had episode of stool with maroon-colored blood and clots.  On 5/22 patient had large-volume maroon stool with clots passed per rectum.  Notified cardiology.  Eliquis has been discontinued.  Serial hemoglobins.  5/23: Continues to have maroon-colored stools with clots.  Hemoglobin continues to drop.  Remains in atrial flutter, ventricular rate approximately 100.  Blood pressure low but stable.  Status post 2 units packed red blood cells  5/24: Hemoglobin continues to drop.  6.4 this morning transfuse additional unit.  Case discussed with palliative care.  Palliative care met with patient and  patient's nephew at bedside.  Decision made to discontinue all aggressive medical care and initiate comfort measures with hospice referral.    Assessment & Plan:   Principal Problem:   Atrial fibrillation with RVR ([place]) Active Problems:   HTN (hypertension)   Benign prostatic hyperplasia without lower urinary tract symptoms   Acute GI bleeding   Iron deficiency anemia due to chronic blood loss   Rectal cancer s/p robotic LAR resectrion 05/25/2020   History of CVA (cerebrovascular accident)   Hypothyroidism   CKD (chronic kidney disease), stage IV (HCC)   Type II diabetes mellitus with renal manifestations ([place])   Depression   Elevated troponin   Atrial fibrillation with rapid ventricular response (HCC)  Rectal bleeding After goals of care discussion with palliative care decision made to discontinue all aggressive medical intervention and proceed with comfort measures. Remains hemodynamically stable  Atrial fibrillation/Aflutter with RVR and elevated troponin Amiodarone infusion stopped after comfort measures initiated  Rectal cancer s/p robotic LAR resectrion 05/25/2020 HTN (hypertension) Benign prostatic hyperplasia without lower urinary tract symptoms Iron deficiency anemia due to chronic blood loss History of CVA (cerebrovascular accident) Hypothyroidism CKD (chronic kidney disease), stage IV (HCC) Diet controlled Type II diabetes mellitus with renal manifestations [facility]):  Depression  All medications not could focus on patient comfort and been discontinued.  Initiate comfort care order set.  DNR status.  Hospice referral. Waiting to hear if any beds available today  DVT prophylaxis: SCD Code Status: DNR.   Family Communication:  Family including nephew hcpoa mark updated @ bedside 5/25. Cousins updated at bedside 5/28 Disposition Plan: Status is: Inpatient  Remains inpatient appropriate because:Inpatient level of care appropriate due to severity of illness   Dispo:  The patient is from: SNF              Anticipated d/c is to: Inpatient hospice facility              Patient currently is not medically stable to d/c.   Difficult to place patient No    All medications not focused on patient comfort have been discontinued.  Initiate full comfort measures.  Hospice referral.  Discharge pending acceptance to inpatient hospice facility.   Level of care: Med-Surg  Consultants:   Cardiology-[facility]  Palliative care  Procedures:   None  Antimicrobials:   None   Subjective: Patient seen and examined.  History limited by underlying dementia and hearing loss.  Tolerating diet. Alert. Denies pain. In no distress.   Objective: Vitals:   06/21/20 0329 06/21/20 0714 06/22/20 0349 06/23/20 0408  BP: 125/72 111/75 114/75 105/72  Pulse: 89 (!) 109 (!) 106 98  Resp: $Remo'16 15 20 20  'nQAOX$ Temp: 97.9 F (36.6 C) 98.5 F (36.9 C) 98.4 F (36.9 C) 99.5 F (37.5 C)  TempSrc: Oral Oral Oral Oral  SpO2: 95% 97% 96% 99%  Weight:      Height:        Intake/Output Summary (Last 24 hours) at 06/23/2020 1052 Last data filed at 06/23/2020 1029 Gross per 24 hour  Intake 360 ml  Output --  Net 360 ml   Filed Weights   06/18/20 0738 06/19/20 0736 06/20/20 0900  Weight: 91.9 kg 92.2 kg 93 kg    Examination:  General exam: No acute distress.  Appears chronically ill Respiratory system: Poor respiratory effort.  Lungs clear.  Room air Cardiovascular system: S1-S2, regular rhythm, no murmurs Gastrointestinal system: Nontender, nondistended, normal bowel sounds  Central nervous system: Alert, oriented x1, no focal deficits  extremities: Decreased power symmetrically Skin: No rashes, lesions or ulcers Psychiatry: Judgement and insight appear impaired.     Data Reviewed: I have personally reviewed following labs and imaging studies  CBC: Recent Labs  Lab 06/17/20 0155 06/17/20 0813 06/18/20 0426 06/18/20 1202 06/18/20 1705 06/18/20 2036 06/19/20 0228  06/19/20 1143  WBC 12.0*  --  12.7*  --   --   --  12.2*  --   NEUTROABS 9.6*  --  10.2*  --   --   --  10.1*  --   HGB 8.6*   < > 6.2* 7.0* 7.6* 6.9* 6.4* 7.2*  HCT 26.2*  --  19.2*  --   --   --  19.2*  --   MCV 86.8  --  86.1  --   --   --  85.0  --   PLT 397  --  283  --   --   --  200  --    < > = values in this interval not displayed.   Basic Metabolic Panel: Recent Labs  Lab 06/17/20 0155 06/18/20 0426 06/19/20 0228  NA 138 138 135  K 4.1 4.7 4.5  CL 105 105 103  CO2 $Re'25 22 22  'ixm$ GLUCOSE 160* 188* 175*  BUN 38* 51* 61*  CREATININE 2.26* 3.15* 3.91*  CALCIUM 8.8* 8.3* 8.2*  MG 2.3 2.2 2.3   GFR: Estimated Creatinine Clearance: 20.7 mL/min (A) (by C-G formula based on SCr of 3.91 mg/dL (H)). Liver Function Tests: No results for input(s): AST, ALT, ALKPHOS, BILITOT, PROT, ALBUMIN in the last 168  hours. No results for input(s): LIPASE, AMYLASE in the last 168 hours. No results for input(s): AMMONIA in the last 168 hours. Coagulation Profile: Recent Labs  Lab 06/18/20 0727  INR 1.3*   Cardiac Enzymes: No results for input(s): CKTOTAL, CKMB, CKMBINDEX, TROPONINI in the last 168 hours. BNP (last 3 results) No results for input(s): PROBNP in the last 8760 hours. HbA1C: No results for input(s): HGBA1C in the last 72 hours. CBG: Recent Labs  Lab 06/17/20 0753 06/18/20 0739 06/18/20 1126 06/18/20 1643 06/19/20 0736  GLUCAP 174* 189* 173* 178* 190*   Lipid Profile: No results for input(s): CHOL, HDL, LDLCALC, TRIG, CHOLHDL, LDLDIRECT in the last 72 hours. Thyroid Function Tests: No results for input(s): TSH, T4TOTAL, FREET4, T3FREE, THYROIDAB in the last 72 hours. Anemia Panel: No results for input(s): VITAMINB12, FOLATE, FERRITIN, TIBC, IRON, RETICCTPCT in the last 72 hours. Sepsis Labs: No results for input(s): PROCALCITON, LATICACIDVEN in the last 168 hours.  Recent Results (from the past 240 hour(s))  Resp Panel by RT-PCR (Flu A&B, Covid) Nasopharyngeal  Swab     Status: None   Collection Time: 06/14/20  2:40 PM   Specimen: Nasopharyngeal Swab; Nasopharyngeal(NP) swabs in vial transport medium  Result Value Ref Range Status   SARS Coronavirus 2 by RT PCR NEGATIVE NEGATIVE Final    Comment: (NOTE) SARS-CoV-2 target nucleic acids are NOT DETECTED.  The SARS-CoV-2 RNA is generally detectable in upper respiratory specimens during the acute phase of infection. The lowest concentration of SARS-CoV-2 viral copies this assay can detect is 138 copies/mL. A negative result does not preclude SARS-Cov-2 infection and should not be used as the sole basis for treatment or other patient management decisions. A negative result may occur with  improper specimen collection/handling, submission of specimen other than nasopharyngeal swab, presence of viral mutation(s) within the areas targeted by this assay, and inadequate number of viral copies(<138 copies/mL). A negative result must be combined with clinical observations, patient history, and epidemiological information. The expected result is Negative.  Fact Sheet for Patients:  EntrepreneurPulse.com.au  Fact Sheet for Healthcare Providers:  IncredibleEmployment.be  This test is no t yet approved or cleared by the [place] FDA and  has been authorized for detection and/or diagnosis of SARS-CoV-2 by FDA under an Emergency Use Authorization (EUA). This EUA will remain  in effect (meaning this test can be used) for the duration of the COVID-19 declaration under Section 564(b)(1) of the Act, 21 U.S.C.section 360bbb-3(b)(1), unless the authorization is terminated  or revoked sooner.       Influenza A by PCR NEGATIVE NEGATIVE Final   Influenza B by PCR NEGATIVE NEGATIVE Final    Comment: (NOTE) The Xpert Xpress SARS-CoV-2/FLU/RSV plus assay is intended as an aid in the diagnosis of influenza from Nasopharyngeal swab specimens and should not be used as a sole  basis for treatment. Nasal washings and aspirates are unacceptable for Xpert Xpress SARS-CoV-2/FLU/RSV testing.  Fact Sheet for Patients: EntrepreneurPulse.com.au  Fact Sheet for Healthcare Providers: IncredibleEmployment.be  This test is not yet approved or cleared by the [place] FDA and has been authorized for detection and/or diagnosis of SARS-CoV-2 by FDA under an Emergency Use Authorization (EUA). This EUA will remain in effect (meaning this test can be used) for the duration of the COVID-19 declaration under Section 564(b)(1) of the Act, 21 U.S.C. section 360bbb-3(b)(1), unless the authorization is terminated or revoked.  Performed at [facility], [address] [place], [place] [address]  Radiology Studies: No results found.      Scheduled Meds: . sodium chloride   Intravenous Once  . sodium chloride   Intravenous Once  . diltiazem  180 mg Oral Daily  . finasteride  5 mg Oral Daily  . pantoprazole (PROTONIX) IV  40 mg Intravenous Q12H  . sertraline  25 mg Oral QHS  . sodium chloride flush  3 mL Intravenous Q12H   Continuous Infusions:    LOS: 8 days    Time spent: 20 minutes    Desma Maxim, MD [facility]  If 7PM-7AM, please contact night-coverage 06/23/2020, 10:52 AM

## 2020-06-24 DIAGNOSIS — I4891 Unspecified atrial fibrillation: Secondary | ICD-10-CM | POA: Diagnosis not present

## 2020-06-24 MED ORDER — PANTOPRAZOLE SODIUM 40 MG PO TBEC
40.0000 mg | DELAYED_RELEASE_TABLET | Freq: Two times a day (BID) | ORAL | Status: DC
  Administered 2020-06-24 – 2020-06-27 (×7): 40 mg via ORAL
  Filled 2020-06-24 (×7): qty 1

## 2020-06-24 NOTE — Progress Notes (Signed)
PHARMACIST - PHYSICIAN COMMUNICATION  DR:   Si Raider  CONCERNING: IV to Oral Route Change Policy  RECOMMENDATION: This patient is receiving pantoprazole by the intravenous route.  Based on criteria approved by the Pharmacy and Therapeutics Committee, the intravenous medication(s) is/are being converted to the equivalent oral dose form(s).   DESCRIPTION: These criteria include:  The patient is eating (either orally or via tube) and/or has been taking other orally administered medications for a least 24 hours  The patient has no evidence of active gastrointestinal bleeding or impaired GI absorption (gastrectomy, short bowel, patient on TNA or NPO).  If you have questions about this conversion, please contact the Pharmacy Department  []   [phone] )  Christopher Delacruz [x]   [phone] )  Christopher Delacruz []   [phone] )  [name] []   [phone] )  [facility] []   [phone] )  Christopher Delacruz, [place] [facility] 06/24/2020 10:15 AM

## 2020-06-24 NOTE — Progress Notes (Signed)
PROGRESS NOTE    Christopher Delacruz  KPV:[id number] DOB: [birth year] DOA: 06/14/2020 PCP: [name], MD   Brief Narrative:   70 y.o. male with medical history significant of HTN, HLD, DM, stroke with right-sided weakness and aphasia, CKD-IV, anemia, BPH, AVR, rectal CA (s/p of robotic LAR resection 05/25/20), hypothyroidism, depression with anxiety, cognitive impairment, dementia, who presents with tachycardia.  Patient has history of dementia and aphasia secondary to previous stroke, and appears to be very poor historian and is unable to provide accurate medical history, therefore, most of the history is obtained by discussing the case with ED physician, per EMS report, and with the nursing staff.  Per report, pt was found to have tachycardia with in SNF. Per EDP, pt was found to new onset A fib with RVR with HR up to 140s in ED. Pt was given 15 mg of Cardizem IV and started Cardizem drip.  Patient converted to sinus rhythm.  Discussed with cardiology.  Converting to p.o. Cardizem long acting and starting Eliquis.  Patient has a complex urologic and oncologic history.  As such he is a high risk for complications and will need close follow-up.  Overall rate controlled with occasional bursts of rapid atrial fibrillation.  Control beta-blocker.  5/22: Rate control remains challenging.  On 5/21 patient had episode of stool with maroon-colored blood and clots.  On 5/22 patient had large-volume maroon stool with clots passed per rectum.  Notified cardiology.  Eliquis has been discontinued.  Serial hemoglobins.  5/23: Continues to have maroon-colored stools with clots.  Hemoglobin continues to drop.  Remains in atrial flutter, ventricular rate approximately 100.  Blood pressure low but stable.  Status post 2 units packed red blood cells  5/24: Hemoglobin continues to drop.  6.4 this morning transfuse additional unit.  Case discussed with palliative care.  Palliative care met with patient and  patient's nephew at bedside.  Decision made to discontinue all aggressive medical care and initiate comfort measures with hospice referral.    Assessment & Plan:   Principal Problem:   Atrial fibrillation with RVR ([place]) Active Problems:   HTN (hypertension)   Benign prostatic hyperplasia without lower urinary tract symptoms   Acute GI bleeding   Iron deficiency anemia due to chronic blood loss   Rectal cancer s/p robotic LAR resectrion 05/25/2020   History of CVA (cerebrovascular accident)   Hypothyroidism   CKD (chronic kidney disease), stage IV (HCC)   Type II diabetes mellitus with renal manifestations ([place])   Depression   Elevated troponin   Atrial fibrillation with rapid ventricular response (HCC)  Rectal bleeding After goals of care discussion with palliative care decision made to discontinue all aggressive medical intervention and proceed with comfort measures. Remains hemodynamically stable  Atrial fibrillation/Aflutter with RVR and elevated troponin Amiodarone infusion stopped after comfort measures initiated  Rectal cancer s/p robotic LAR resectrion 05/25/2020 HTN (hypertension) Benign prostatic hyperplasia without lower urinary tract symptoms Iron deficiency anemia due to chronic blood loss History of CVA (cerebrovascular accident) Hypothyroidism CKD (chronic kidney disease), stage IV (HCC) Diet controlled Type II diabetes mellitus with renal manifestations [facility]):  Depression  All medications not could focus on patient comfort and been discontinued.  Initiate comfort care order set.  DNR status.  Hospice referral. Waiting to hear if any beds available today  DVT prophylaxis: SCD Code Status: DNR.   Family Communication:  Family including nephew hcpoa mark updated @ bedside 5/25. Cousins updated at bedside 5/28 Disposition Plan: Status is: Inpatient  Remains inpatient appropriate because:Inpatient level of care appropriate due to severity of illness   Dispo:  The patient is from: SNF              Anticipated d/c is to: Inpatient hospice facility              Patient currently is not medically stable to d/c.   Difficult to place patient No    All medications not focused on patient comfort have been discontinued.  Initiate full comfort measures.  Hospice referral.  Discharge pending acceptance to inpatient hospice facility.   Level of care: Med-Surg  Consultants:   Cardiology-[facility]  Palliative care  Procedures:   None  Antimicrobials:   None   Subjective: Patient seen and examined.  History limited by underlying dementia and hearing loss.  Asleep, rouses. No complaints.  Objective: Vitals:   06/23/20 1132 06/23/20 2021 06/24/20 0725 06/24/20 0826  BP: 112/84 119/82 98/79   Pulse: (!) 108 (!) 107 (!) 112 100  Resp: 16 (!) 31 (!) 21 18  Temp: (!) 97.4 F (36.3 C) 100.2 F (37.9 C) (!) 100.5 F (38.1 C) 98.5 F (36.9 C)  TempSrc:  Oral  Oral  SpO2: 99% 100% 98% 99%  Weight:      Height:        Intake/Output Summary (Last 24 hours) at 06/24/2020 0936 Last data filed at 06/23/2020 2300 Gross per 24 hour  Intake 420 ml  Output --  Net 420 ml   Filed Weights   06/18/20 0738 06/19/20 0736 06/20/20 0900  Weight: 91.9 kg 92.2 kg 93 kg    Examination:  General exam: No acute distress.  Appears chronically ill Respiratory system: Poor respiratory effort.  Lungs clear.  Room air Cardiovascular system: S1-S2, regular rhythm, no murmurs Gastrointestinal system: Nontender, nondistended, normal bowel sounds  Central nervous system: Alert, oriented x1, no focal deficits  extremities: Decreased power symmetrically Skin: No rashes, lesions or ulcers Psychiatry: Judgement and insight appear impaired.     Data Reviewed: I have personally reviewed following labs and imaging studies  CBC: Recent Labs  Lab 06/18/20 0426 06/18/20 1202 06/18/20 1705 06/18/20 2036 06/19/20 0228 06/19/20 1143  WBC 12.7*  --   --   --  12.2*   --   NEUTROABS 10.2*  --   --   --  10.1*  --   HGB 6.2* 7.0* 7.6* 6.9* 6.4* 7.2*  HCT 19.2*  --   --   --  19.2*  --   MCV 86.1  --   --   --  85.0  --   PLT 283  --   --   --  200  --    Basic Metabolic Panel: Recent Labs  Lab 06/18/20 0426 06/19/20 0228  NA 138 135  K 4.7 4.5  CL 105 103  CO2 22 22  GLUCOSE 188* 175*  BUN 51* 61*  CREATININE 3.15* 3.91*  CALCIUM 8.3* 8.2*  MG 2.2 2.3   GFR: Estimated Creatinine Clearance: 20.7 mL/min (A) (by C-G formula based on SCr of 3.91 mg/dL (H)). Liver Function Tests: No results for input(s): AST, ALT, ALKPHOS, BILITOT, PROT, ALBUMIN in the last 168 hours. No results for input(s): LIPASE, AMYLASE in the last 168 hours. No results for input(s): AMMONIA in the last 168 hours. Coagulation Profile: Recent Labs  Lab 06/18/20 0727  INR 1.3*   Cardiac Enzymes: No results for input(s): CKTOTAL, CKMB, CKMBINDEX, TROPONINI in the last 168 hours. BNP (  last 3 results) No results for input(s): PROBNP in the last 8760 hours. HbA1C: No results for input(s): HGBA1C in the last 72 hours. CBG: Recent Labs  Lab 06/18/20 0739 06/18/20 1126 06/18/20 1643 06/19/20 0736  GLUCAP 189* 173* 178* 190*   Lipid Profile: No results for input(s): CHOL, HDL, LDLCALC, TRIG, CHOLHDL, LDLDIRECT in the last 72 hours. Thyroid Function Tests: No results for input(s): TSH, T4TOTAL, FREET4, T3FREE, THYROIDAB in the last 72 hours. Anemia Panel: No results for input(s): VITAMINB12, FOLATE, FERRITIN, TIBC, IRON, RETICCTPCT in the last 72 hours. Sepsis Labs: No results for input(s): PROCALCITON, LATICACIDVEN in the last 168 hours.  Recent Results (from the past 240 hour(s))  Resp Panel by RT-PCR (Flu A&B, Covid) Nasopharyngeal Swab     Status: None   Collection Time: 06/14/20  2:40 PM   Specimen: Nasopharyngeal Swab; Nasopharyngeal(NP) swabs in vial transport medium  Result Value Ref Range Status   SARS Coronavirus 2 by RT PCR NEGATIVE NEGATIVE Final     Comment: (NOTE) SARS-CoV-2 target nucleic acids are NOT DETECTED.  The SARS-CoV-2 RNA is generally detectable in upper respiratory specimens during the acute phase of infection. The lowest concentration of SARS-CoV-2 viral copies this assay can detect is 138 copies/mL. A negative result does not preclude SARS-Cov-2 infection and should not be used as the sole basis for treatment or other patient management decisions. A negative result may occur with  improper specimen collection/handling, submission of specimen other than nasopharyngeal swab, presence of viral mutation(s) within the areas targeted by this assay, and inadequate number of viral copies(<138 copies/mL). A negative result must be combined with clinical observations, patient history, and epidemiological information. The expected result is Negative.  Fact Sheet for Patients:  EntrepreneurPulse.com.au  Fact Sheet for Healthcare Providers:  IncredibleEmployment.be  This test is no t yet approved or cleared by the [place] FDA and  has been authorized for detection and/or diagnosis of SARS-CoV-2 by FDA under an Emergency Use Authorization (EUA). This EUA will remain  in effect (meaning this test can be used) for the duration of the COVID-19 declaration under Section 564(b)(1) of the Act, 21 U.S.C.section 360bbb-3(b)(1), unless the authorization is terminated  or revoked sooner.       Influenza A by PCR NEGATIVE NEGATIVE Final   Influenza B by PCR NEGATIVE NEGATIVE Final    Comment: (NOTE) The Xpert Xpress SARS-CoV-2/FLU/RSV plus assay is intended as an aid in the diagnosis of influenza from Nasopharyngeal swab specimens and should not be used as a sole basis for treatment. Nasal washings and aspirates are unacceptable for Xpert Xpress SARS-CoV-2/FLU/RSV testing.  Fact Sheet for Patients: EntrepreneurPulse.com.au  Fact Sheet for Healthcare  Providers: IncredibleEmployment.be  This test is not yet approved or cleared by the [place] FDA and has been authorized for detection and/or diagnosis of SARS-CoV-2 by FDA under an Emergency Use Authorization (EUA). This EUA will remain in effect (meaning this test can be used) for the duration of the COVID-19 declaration under Section 564(b)(1) of the Act, 21 U.S.C. section 360bbb-3(b)(1), unless the authorization is terminated or revoked.  Performed at [facility], [address] [place], [place] [address]          Radiology Studies: No results found.      Scheduled Meds: . sodium chloride   Intravenous Once  . sodium chloride   Intravenous Once  . diltiazem  180 mg Oral Daily  . finasteride  5 mg Oral Daily  . pantoprazole (PROTONIX) IV  40 mg Intravenous Q12H  . sertraline  25 mg Oral QHS  . sodium chloride flush  3 mL Intravenous Q12H   Continuous Infusions:    LOS: 9 days    Time spent: 15 minutes    Desma Maxim, MD [facility]  If 7PM-7AM, please contact night-coverage 06/24/2020, 9:36 AM

## 2020-06-24 NOTE — TOC Progression Note (Signed)
Transition of Care [facility]) - Progression Note    Patient Details  Name: Christopher Delacruz MRN: [id number] Date of Birth: 1951-01-07  Transition of Care [facility]) CM/SW Contact  Izola Price, RN Phone Number: 06/24/2020, 12:10 PM  Clinical Narrative: Patient is comfort care awaiting a Hospice Home bed per notes. No bed notifications thus far today. Simmie Davies RN CM       Expected Discharge Plan: Hospice Medical Facility Barriers to Discharge: Hospice Bed not available  Expected Discharge Plan and Services Expected Discharge Plan: [place] In-house Referral: Clinical Social Work   Post Acute Care Choice: Hospice Living arrangements for the past 2 months: [place]: NA           Social Determinants of Health (SDOH) Interventions    Readmission Risk Interventions Readmission Risk Prevention Plan 05/28/2020  Transportation Screening Complete  HRI or Home Care Consult Complete  Social Work Consult for [place] Planning/Counseling Complete  Palliative Care Screening Not Applicable  Medication Review Press photographer) Complete  Some recent data might be hidden

## 2020-06-25 DIAGNOSIS — I4891 Unspecified atrial fibrillation: Secondary | ICD-10-CM | POA: Diagnosis not present

## 2020-06-25 NOTE — TOC Progression Note (Signed)
Transition of Care [facility]) - Progression Note    Patient Details  Name: Christopher Delacruz MRN: [id number] Date of Birth: 08-21-1950  Transition of Care [facility]) CM/SW Contact  Shelbie Hutching, RN Phone Number: 06/25/2020, 2:14 PM  Clinical Narrative:    No hospice bed today.    Expected Discharge Plan: [place] Barriers to Discharge: Hospice Bed not available  Expected Discharge Plan and Services Expected Discharge Plan: [place] In-house Referral: Clinical Social Work   Post Acute Care Choice: Hospice Living arrangements for the past 2 months: [place]: NA           Social Determinants of Health (SDOH) Interventions    Readmission Risk Interventions Readmission Risk Prevention Plan 05/28/2020  Transportation Screening Complete  HRI or Home Care Consult Complete  Social Work Consult for [place] Planning/Counseling Complete  Palliative Care Screening Not Applicable  Medication Review Press photographer) Complete  Some recent data might be hidden

## 2020-06-25 NOTE — Progress Notes (Signed)
[facility] [place]) [facility] Liaison Note  Patient has been approved for hospice home however unfortunately we do not have a bed to offer today, TOC is aware.  Hospital liaison will continue to follow along and update tomorrow or sooner if a bed becomes available.   Thank you for the opportunity to participate in this patient's care.   Please call with any hospice related questions or concerns.  Jhonnie Garner, Therapist, sports, BSN, [facility] [phone]

## 2020-06-25 NOTE — Progress Notes (Signed)
PROGRESS NOTE    Christopher Delacruz  QIW:[id number] DOB: [birth year] DOA: 06/14/2020 PCP: [name], MD   Brief Narrative:   70 y.o. male with medical history significant of HTN, HLD, DM, stroke with right-sided weakness and aphasia, CKD-IV, anemia, BPH, AVR, rectal CA (s/p of robotic LAR resection 05/25/20), hypothyroidism, depression with anxiety, cognitive impairment, dementia, who presents with tachycardia.  Patient has history of dementia and aphasia secondary to previous stroke, and appears to be very poor historian and is unable to provide accurate medical history, therefore, most of the history is obtained by discussing the case with ED physician, per EMS report, and with the nursing staff.  Per report, pt was found to have tachycardia with in SNF. Per EDP, pt was found to new onset A fib with RVR with HR up to 140s in ED. Pt was given 15 mg of Cardizem IV and started Cardizem drip.  Patient converted to sinus rhythm.  Discussed with cardiology.  Converting to p.o. Cardizem long acting and starting Eliquis.  Patient has a complex urologic and oncologic history.  As such he is a high risk for complications and will need close follow-up.  Overall rate controlled with occasional bursts of rapid atrial fibrillation.  Control beta-blocker.  5/22: Rate control remains challenging.  On 5/21 patient had episode of stool with maroon-colored blood and clots.  On 5/22 patient had large-volume maroon stool with clots passed per rectum.  Notified cardiology.  Eliquis has been discontinued.  Serial hemoglobins.  5/23: Continues to have maroon-colored stools with clots.  Hemoglobin continues to drop.  Remains in atrial flutter, ventricular rate approximately 100.  Blood pressure low but stable.  Status post 2 units packed red blood cells  5/24: Hemoglobin continues to drop.  6.4 this morning transfuse additional unit.  Case discussed with palliative care.  Palliative care met with patient and  patient's nephew at bedside.  Decision made to discontinue all aggressive medical care and initiate comfort measures with hospice referral.    Assessment & Plan:   Principal Problem:   Atrial fibrillation with RVR ([place]) Active Problems:   HTN (hypertension)   Benign prostatic hyperplasia without lower urinary tract symptoms   Acute GI bleeding   Iron deficiency anemia due to chronic blood loss   Rectal cancer s/p robotic LAR resectrion 05/25/2020   History of CVA (cerebrovascular accident)   Hypothyroidism   CKD (chronic kidney disease), stage IV (HCC)   Type II diabetes mellitus with renal manifestations ([place])   Depression   Elevated troponin   Atrial fibrillation with rapid ventricular response (HCC)  Rectal bleeding After goals of care discussion with palliative care decision made to discontinue all aggressive medical intervention and proceed with comfort measures. Remains hemodynamically stable  Atrial fibrillation/Aflutter with RVR and elevated troponin Amiodarone infusion stopped after comfort measures initiated  Rectal cancer s/p robotic LAR resectrion 05/25/2020 HTN (hypertension) Benign prostatic hyperplasia without lower urinary tract symptoms Iron deficiency anemia due to chronic blood loss History of CVA (cerebrovascular accident) Hypothyroidism CKD (chronic kidney disease), stage IV (HCC) Diet controlled Type II diabetes mellitus with renal manifestations [facility]):  Depression  All medications not could focus on patient comfort and been discontinued.  Initiated comfort care order set.  DNR status.  Hospice referral. Waiting to hear if any beds available today. None available over the weekend.  DVT prophylaxis: SCD Code Status: DNR.   Family Communication:  Nephew updated telephonically 5/30 Disposition Plan: Status is: Inpatient  Remains inpatient appropriate because:Inpatient level  of care appropriate due to severity of illness   Dispo: The patient is from:  SNF              Anticipated d/c is to: Inpatient hospice facility              Patient currently is not medically stable to d/c.   Difficult to place patient No    All medications not focused on patient comfort have been discontinued.  Initiate full comfort measures.  Hospice referral.  Discharge pending acceptance to inpatient hospice facility.   Level of care: Med-Surg  Consultants:   Cardiology-[facility]  Palliative care  Procedures:   None  Antimicrobials:   None   Subjective: Patient seen and examined.  History limited by underlying dementia and hearing loss. No complaints.  Objective: Vitals:   06/24/20 0725 06/24/20 0826 06/24/20 1026 06/25/20 0523  BP: 98/79  106/67 111/89  Pulse: (!) 112 100 95 (!) 106  Resp: (!) 21 18  15   Temp: (!) 100.5 F (38.1 C) 98.5 F (36.9 C)  98.3 F (36.8 C)  TempSrc:  Oral  Oral  SpO2: 98% 99%  97%  Weight:      Height:        Intake/Output Summary (Last 24 hours) at 06/25/2020 0939 Last data filed at 06/25/2020 0500 Gross per 24 hour  Intake 0 ml  Output 650 ml  Net -650 ml   Filed Weights   06/18/20 0738 06/19/20 0736 06/20/20 0900  Weight: 91.9 kg 92.2 kg 93 kg    Examination:  General exam: No acute distress.  Appears chronically ill Respiratory system: Poor respiratory effort.  Lungs clear.  Room air Cardiovascular system: S1-S2, regular rhythm, no murmurs Gastrointestinal system: Nontender, nondistended, normal bowel sounds  Central nervous system: Alert, oriented x1, no focal deficits  extremities: Decreased power symmetrically Skin: No rashes, lesions or ulcers Psychiatry: Judgement and insight appear impaired.     Data Reviewed: I have personally reviewed following labs and imaging studies  CBC: Recent Labs  Lab 06/18/20 1202 06/18/20 1705 06/18/20 2036 06/19/20 0228 06/19/20 1143  WBC  --   --   --  12.2*  --   NEUTROABS  --   --   --  10.1*  --   HGB 7.0* 7.6* 6.9* 6.4* 7.2*  HCT  --   --    --  19.2*  --   MCV  --   --   --  85.0  --   PLT  --   --   --  200  --    Basic Metabolic Panel: Recent Labs  Lab 06/19/20 0228  NA 135  K 4.5  CL 103  CO2 22  GLUCOSE 175*  BUN 61*  CREATININE 3.91*  CALCIUM 8.2*  MG 2.3   GFR: Estimated Creatinine Clearance: 20.7 mL/min (A) (by C-G formula based on SCr of 3.91 mg/dL (H)). Liver Function Tests: No results for input(s): AST, ALT, ALKPHOS, BILITOT, PROT, ALBUMIN in the last 168 hours. No results for input(s): LIPASE, AMYLASE in the last 168 hours. No results for input(s): AMMONIA in the last 168 hours. Coagulation Profile: No results for input(s): INR, PROTIME in the last 168 hours. Cardiac Enzymes: No results for input(s): CKTOTAL, CKMB, CKMBINDEX, TROPONINI in the last 168 hours. BNP (last 3 results) No results for input(s): PROBNP in the last 8760 hours. HbA1C: No results for input(s): HGBA1C in the last 72 hours. CBG: Recent Labs  Lab 06/18/20 1126 06/18/20  1643 06/19/20 0736  GLUCAP 173* 178* 190*   Lipid Profile: No results for input(s): CHOL, HDL, LDLCALC, TRIG, CHOLHDL, LDLDIRECT in the last 72 hours. Thyroid Function Tests: No results for input(s): TSH, T4TOTAL, FREET4, T3FREE, THYROIDAB in the last 72 hours. Anemia Panel: No results for input(s): VITAMINB12, FOLATE, FERRITIN, TIBC, IRON, RETICCTPCT in the last 72 hours. Sepsis Labs: No results for input(s): PROCALCITON, LATICACIDVEN in the last 168 hours.  No results found for this or any previous visit (from the past 240 hour(s)).       Radiology Studies: No results found.      Scheduled Meds: . sodium chloride   Intravenous Once  . sodium chloride   Intravenous Once  . diltiazem  180 mg Oral Daily  . finasteride  5 mg Oral Daily  . pantoprazole  40 mg Oral BID  . sertraline  25 mg Oral QHS  . sodium chloride flush  3 mL Intravenous Q12H   Continuous Infusions:    LOS: 10 days    Time spent: 15 minutes    Desma Maxim,  MD [facility]  If 7PM-7AM, please contact night-coverage 06/25/2020, 9:39 AM

## 2020-06-26 DIAGNOSIS — I4891 Unspecified atrial fibrillation: Secondary | ICD-10-CM | POA: Diagnosis not present

## 2020-06-26 NOTE — Plan of Care (Signed)
  Problem: Education: Goal: Knowledge of General Education information will improve Description: Including pain rating scale, medication(s)/side effects and non-pharmacologic comfort measures Outcome: Progressing   Problem: Coping: Goal: Level of anxiety will decrease Outcome: Progressing   Problem: Elimination: Goal: Will not experience complications related to bowel motility Outcome: Progressing Goal: Will not experience complications related to urinary retention Outcome: Progressing

## 2020-06-26 NOTE — Progress Notes (Signed)
PROGRESS NOTE    Christopher Delacruz  LNL:[id number] DOB: [birth year] DOA: 06/14/2020 PCP: [name], MD   Brief Narrative:   70 y.o. male with medical history significant of HTN, HLD, DM, stroke with right-sided weakness and aphasia, CKD-IV, anemia, BPH, AVR, rectal CA (s/p of robotic LAR resection 05/25/20), hypothyroidism, depression with anxiety, cognitive impairment, dementia, who presents with tachycardia.  Patient has history of dementia and aphasia secondary to previous stroke, and appears to be very poor historian and is unable to provide accurate medical history, therefore, most of the history is obtained by discussing the case with ED physician, per EMS report, and with the nursing staff.  Per report, pt was found to have tachycardia with in SNF. Per EDP, pt was found to new onset A fib with RVR with HR up to 140s in ED. Pt was given 15 mg of Cardizem IV and started Cardizem drip.  Patient converted to sinus rhythm.  Discussed with cardiology.  Converting to p.o. Cardizem long acting and starting Eliquis.  Patient has a complex urologic and oncologic history.  As such he is a high risk for complications and will need close follow-up.  Overall rate controlled with occasional bursts of rapid atrial fibrillation.  Control beta-blocker.  5/22: Rate control remains challenging.  On 5/21 patient had episode of stool with maroon-colored blood and clots.  On 5/22 patient had large-volume maroon stool with clots passed per rectum.  Notified cardiology.  Eliquis has been discontinued.  Serial hemoglobins.  5/23: Continues to have maroon-colored stools with clots.  Hemoglobin continues to drop.  Remains in atrial flutter, ventricular rate approximately 100.  Blood pressure low but stable.  Status post 2 units packed red blood cells  5/24: Hemoglobin continues to drop.  6.4 this morning transfuse additional unit.  Case discussed with palliative care.  Palliative care met with patient and  patient's nephew at bedside.  Decision made to discontinue all aggressive medical care and initiate comfort measures with hospice referral.    Assessment & Plan:   Principal Problem:   Atrial fibrillation with RVR ([place]) Active Problems:   HTN (hypertension)   Benign prostatic hyperplasia without lower urinary tract symptoms   Acute GI bleeding   Iron deficiency anemia due to chronic blood loss   Rectal cancer s/p robotic LAR resectrion 05/25/2020   History of CVA (cerebrovascular accident)   Hypothyroidism   CKD (chronic kidney disease), stage IV (HCC)   Type II diabetes mellitus with renal manifestations ([place])   Depression   Elevated troponin   Atrial fibrillation with rapid ventricular response (HCC)  Rectal bleeding After goals of care discussion with palliative care decision made to discontinue all aggressive medical intervention and proceed with comfort measures. Remains hemodynamically stable  Atrial fibrillation/Aflutter with RVR and elevated troponin Amiodarone infusion stopped after comfort measures initiated  Rectal cancer s/p robotic LAR resectrion 05/25/2020 HTN (hypertension) Benign prostatic hyperplasia without lower urinary tract symptoms Iron deficiency anemia due to chronic blood loss History of CVA (cerebrovascular accident) Hypothyroidism CKD (chronic kidney disease), stage IV (HCC) Diet controlled Type II diabetes mellitus with renal manifestations [facility]):  Depression  All medications not could focus on patient comfort and been discontinued.  Initiated comfort care order set.  DNR status.  Hospice referral. Waiting to hear if any beds available today. No bed available today.  DVT prophylaxis: SCD Code Status: DNR.   Family Communication:  Nephew updated telephonically 5/30 Disposition Plan: Status is: Inpatient  Remains inpatient appropriate because:Inpatient level of  care appropriate due to severity of illness   Dispo: The patient is from: SNF               Anticipated d/c is to: Inpatient hospice facility              Patient currently is not medically stable to d/c.   Difficult to place patient No    All medications not focused on patient comfort have been discontinued.  Initiate full comfort measures.  Hospice referral.  Discharge pending acceptance to inpatient hospice facility.   Level of care: Med-Surg  Consultants:   Cardiology-[facility]  Palliative care  Procedures:   None  Antimicrobials:   None   Subjective: Patient seen and examined.  History limited by underlying dementia and hearing loss. No complaints. Tolerating diet.  Objective: Vitals:   06/25/20 0523 06/25/20 0955 06/25/20 2030 06/26/20 0932  BP: 111/89 (!) 110/59 122/77 112/68  Pulse: (!) 106 91 93 97  Resp: 15 18 16  (!) 25  Temp: 98.3 F (36.8 C) 98.5 F (36.9 C) 98.1 F (36.7 C) 98.4 F (36.9 C)  TempSrc: Oral Oral Oral Oral  SpO2: 97% 98% 97% 97%  Weight:      Height:        Intake/Output Summary (Last 24 hours) at 06/26/2020 1320 Last data filed at 06/26/2020 1021 Gross per 24 hour  Intake 120 ml  Output 300 ml  Net -180 ml   Filed Weights   06/18/20 0738 06/19/20 0736 06/20/20 0900  Weight: 91.9 kg 92.2 kg 93 kg    Examination:  General exam: No acute distress.  Appears chronically ill Respiratory system: Poor respiratory effort.  Lungs clear.  Room air Cardiovascular system: S1-S2, regular rhythm, no murmurs Gastrointestinal system: Nontender, nondistended, normal bowel sounds  Central nervous system: Alert, oriented x1, no focal deficits  extremities: Decreased power symmetrically Skin: No rashes, lesions or ulcers Psychiatry: Judgement and insight appear impaired.     Data Reviewed: I have personally reviewed following labs and imaging studies  CBC: No results for input(s): WBC, NEUTROABS, HGB, HCT, MCV, PLT in the last 168 hours. Basic Metabolic Panel: No results for input(s): NA, K, CL, CO2, GLUCOSE, BUN, CREATININE,  CALCIUM, MG, PHOS in the last 168 hours. GFR: Estimated Creatinine Clearance: 20.7 mL/min (A) (by C-G formula based on SCr of 3.91 mg/dL (H)). Liver Function Tests: No results for input(s): AST, ALT, ALKPHOS, BILITOT, PROT, ALBUMIN in the last 168 hours. No results for input(s): LIPASE, AMYLASE in the last 168 hours. No results for input(s): AMMONIA in the last 168 hours. Coagulation Profile: No results for input(s): INR, PROTIME in the last 168 hours. Cardiac Enzymes: No results for input(s): CKTOTAL, CKMB, CKMBINDEX, TROPONINI in the last 168 hours. BNP (last 3 results) No results for input(s): PROBNP in the last 8760 hours. HbA1C: No results for input(s): HGBA1C in the last 72 hours. CBG: No results for input(s): GLUCAP in the last 168 hours. Lipid Profile: No results for input(s): CHOL, HDL, LDLCALC, TRIG, CHOLHDL, LDLDIRECT in the last 72 hours. Thyroid Function Tests: No results for input(s): TSH, T4TOTAL, FREET4, T3FREE, THYROIDAB in the last 72 hours. Anemia Panel: No results for input(s): VITAMINB12, FOLATE, FERRITIN, TIBC, IRON, RETICCTPCT in the last 72 hours. Sepsis Labs: No results for input(s): PROCALCITON, LATICACIDVEN in the last 168 hours.  No results found for this or any previous visit (from the past 240 hour(s)).       Radiology Studies: No results found.  Scheduled Meds: . sodium chloride   Intravenous Once  . sodium chloride   Intravenous Once  . diltiazem  180 mg Oral Daily  . finasteride  5 mg Oral Daily  . pantoprazole  40 mg Oral BID  . sertraline  25 mg Oral QHS  . sodium chloride flush  3 mL Intravenous Q12H   Continuous Infusions:    LOS: 11 days    Time spent: 15 minutes    Desma Maxim, MD [facility]  If 7PM-7AM, please contact night-coverage 06/26/2020, 1:20 PM

## 2020-06-26 NOTE — Progress Notes (Signed)
[facility] ([facility])  There is not a bed today at hospice home for Mr. Christopher Delacruz.  Updated nephew [name] at the bedside.  [facility] will continue to follow.  Venia Carbon RN, BSN, [facility] Liaison

## 2020-06-27 ENCOUNTER — Ambulatory Visit: Payer: Medicare Other | Admitting: Radiation Oncology

## 2020-06-27 DIAGNOSIS — I4891 Unspecified atrial fibrillation: Secondary | ICD-10-CM | POA: Diagnosis not present

## 2020-06-27 MED ORDER — GLYCOPYRROLATE 1 MG PO TABS: 1.0000 mg | ORAL_TABLET | ORAL | 0 refills | Status: AC | PRN

## 2020-06-27 MED ORDER — ONDANSETRON 4 MG PO TBDP: 4.0000 mg | ORAL_TABLET | Freq: Four times a day (QID) | ORAL | 0 refills | Status: AC | PRN

## 2020-06-27 MED ORDER — PANTOPRAZOLE SODIUM 40 MG PO TBEC: 40.0000 mg | DELAYED_RELEASE_TABLET | Freq: Two times a day (BID) | ORAL | 0 refills | Status: AC

## 2020-06-27 MED ORDER — LORAZEPAM 1 MG PO TABS: 1.0000 mg | ORAL_TABLET | ORAL | 0 refills | Status: AC | PRN

## 2020-06-27 MED ORDER — DILTIAZEM HCL ER COATED BEADS 180 MG PO CP24: 180.0000 mg | ORAL_CAPSULE | Freq: Every day | ORAL | 0 refills | Status: AC

## 2020-06-27 NOTE — Discharge Summary (Signed)
Physician Discharge Summary  Christopher Delacruz WPY:[id number] DOB: Dec 15, 1950 DOA: 06/14/2020  PCP: [name], MD  Admit date: 06/14/2020 Discharge date: 06/27/2020  Admitted From: Home  Disposition:  Residential Hospice.   Recommendations for Outpatient Follow-up:  1. Comfort care.     Discharge Condition: Stable.  CODE STATUS: DNR Diet recommendation: Comfort feeding   Brief/Interim Summary: 70 y.o.malewith medical history significant ofHTN, HLD, DM, strokewithright-sided weakness and aphasia, CKD-IV, anemia, BPH, AVR, rectal CA (s/p ofroboticLARresection 05/25/20), hypothyroidism, depression with anxiety,cognitive impairment,dementia,who presents with tachycardia.  Patient hashistory of dementia and aphasia secondary to previous stroke,andappears to beverypoor historian and is unable to provide accurate medical history, therefore, most of the history is obtained by discussing the case with ED physician, per EMS report, and with the nursing staff.  Per report, pt wasfound to have tachycardia within SNF. Per EDP, pt was found to new onset A fib with RVR with HR up to 140s in ED. Pt was given 15 mg ofCardizem IV and started Cardizem drip. Patient converted to sinus rhythm.  Care was iscussed with cardiology.  Converting to p.o. Cardizem long acting and starting Eliquis.  Patient has a complex urologic and oncologic history.  As such he is a high risk for complications and will need close follow-up.  Overall rate controlled with occasional bursts of rapid atrial fibrillation.  Control beta-blocker.  5/22: Rate control remains challenging.  On 5/21 patient had episode of stool with maroon-colored blood and clots.  On 5/22 patient had large-volume maroon stool with clots passed per rectum.  Notified cardiology.  Eliquis has been discontinued.  Serial hemoglobins.  5/23: Continues to have maroon-colored stools with clots.  Hemoglobin continues to drop.  Remains in  atrial flutter, ventricular rate approximately 100.  Blood pressure low but stable.  Status post 2 units packed red blood cells  5/24: Hemoglobin continues to drop.  6.4 this morning transfuse additional unit.  Case discussed with palliative care.  Palliative care met with patient and patient's nephew at bedside.  Decision made to discontinue all aggressive medical care and initiate comfort measures with hospice referral.    Rectal bleeding After goals of care discussion with palliative care decision made to discontinue all aggressive medical intervention and proceed with comfort measures. Remains hemodynamically stable  Atrial fibrillation/Aflutterwith RVR and elevated troponin Amiodarone infusion stopped after comfort measures initiated  Rectal cancer s/p robotic LAR resectrion 05/25/2020 HTN (hypertension) Benign prostatic hyperplasia without lower urinary tract symptoms Iron deficiency anemia due to chronic blood loss History of CVA (cerebrovascular accident) Hypothyroidism CKD (chronic kidney disease), stage IV ([place]) Diet controlledType II diabetes mellitus with renal manifestations [facility]): Depression  All medications not could focus on patient comfort and been discontinued.  Initiated comfort care order set.  DNR status.  Hospice referral.   Patient stable to be transfer to residential Hospice facility.      Discharge Diagnoses:  Principal Problem:   Atrial fibrillation with RVR ([place]) Active Problems:   HTN (hypertension)   Benign prostatic hyperplasia without lower urinary tract symptoms   Acute GI bleeding   Iron deficiency anemia due to chronic blood loss   Rectal cancer s/p robotic LAR resectrion 05/25/2020   History of CVA (cerebrovascular accident)   Hypothyroidism   CKD (chronic kidney disease), stage IV ([place])   Type II diabetes mellitus with renal manifestations ([place])   Depression   Elevated troponin   Atrial fibrillation with rapid ventricular  response [facility])    Discharge Instructions  Discharge Instructions  Diet - low sodium heart healthy   Complete by: As directed    Increase activity slowly   Complete by: As directed      Allergies as of 06/27/2020   No Known Allergies     Medication List    STOP taking these medications   amLODipine 10 MG tablet Commonly known as: NORVASC   aspirin 81 MG EC tablet   clopidogrel 75 MG tablet Commonly known as: PLAVIX   ferrous sulfate 325 (65 FE) MG tablet   GaviLAX 17 GM/SCOOP powder Generic drug: polyethylene glycol powder   hydrochlorothiazide 12.5 MG tablet Commonly known as: HYDRODIURIL   multivitamin Tabs tablet   ondansetron 8 MG tablet Commonly known as: Zofran   traMADol 50 MG tablet Commonly known as: ULTRAM   vitamin B-12 1000 MCG tablet Commonly known as: CYANOCOBALAMIN   Vitamin D3 50 MCG (2000 UT) capsule     TAKE these medications   diltiazem 180 MG 24 hr capsule Commonly known as: CARDIZEM CD Take 1 capsule (180 mg total) by mouth daily. Start taking on: June 28, 2020   finasteride 5 MG tablet Commonly known as: PROSCAR Take 5 mg by mouth daily.   glycopyrrolate 1 MG tablet Commonly known as: ROBINUL Take 1 tablet (1 mg total) by mouth every 4 (four) hours as needed (excessive secretions).   LORazepam 1 MG tablet Commonly known as: ATIVAN Take 1 tablet (1 mg total) by mouth every 4 (four) hours as needed for anxiety.   ondansetron 4 MG disintegrating tablet Commonly known as: ZOFRAN-ODT Take 1 tablet (4 mg total) by mouth every 6 (six) hours as needed for nausea.   pantoprazole 40 MG tablet Commonly known as: PROTONIX Take 1 tablet (40 mg total) by mouth 2 (two) times daily.   prochlorperazine 10 MG tablet Commonly known as: COMPAZINE Take 10 mg by mouth every 6 (six) hours as needed for nausea or vomiting.   sertraline 25 MG tablet Commonly known as: ZOLOFT Take 25 mg by mouth at bedtime.       Contact information for  after-discharge care    Destination    [facility] Preferred [facility]F .   Service: Skilled Nursing Contact information: [address] [place] [phone]                 No Known Allergies  Consultations:  Palliative  Cardiology    Procedures/Studies: NM GI Blood Loss  Result Date: 06/18/2020 CLINICAL DATA:  Maroon-colored clot in stool.  Decreased hemoglobin. EXAM: NUCLEAR MEDICINE GASTROINTESTINAL BLEEDING SCAN TECHNIQUE: Sequential abdominal images were obtained following intravenous administration of Tc-90mlabeled red blood cells. RADIOPHARMACEUTICALS:  23.24 mCi Tc-960mertechnetate in-vitro labeled red cells. COMPARISON:  None. FINDINGS: Satisfactory uptake is present into the blood pool. Aorta and iliac vessels are well visualized. No bowel uptake is present within the first hour. Patient refused additional imaging into the second hour. IMPRESSION: 1. No evidence for GI hemorrhage. Electronically Signed   By: ChSan Morelle.D.   On: 06/18/2020 14:01   DG Chest Port 1 View  Result Date: 06/14/2020 CLINICAL DATA:  Tachycardia. EXAM: PORTABLE CHEST 1 VIEW COMPARISON:  09/14/2013 FINDINGS: The right IJ power port is stable. The heart is within normal limits in size given the AP projection and portable technique. There is moderate tortuosity and mild calcification of the thoracic aorta. No acute pulmonary findings. No pleural effusion or pulmonary lesions. The bony thorax is intact. IMPRESSION: No acute cardiopulmonary findings.  Electronically Signed   By: Marijo Sanes M.D.   On: 06/14/2020 13:28   ECHOCARDIOGRAM COMPLETE  Result Date: 06/17/2020    ECHOCARDIOGRAM REPORT   Patient Name:   Christopher Delacruz Date of Exam: 06/15/2020 Medical Rec #:  [id number]    Height:       75.0 in Accession #:    [id number]   Weight:       228.8 lb Date of Birth:  1950/12/24     BSA:          2.324 m Patient Age:    80 years     BP:           121/68  mmHg Patient Gender: M            HR:           102 bpm. Exam Location:  [facility] Procedure: 2D Echo, Cardiac Doppler and Color Doppler Indications:     Atrial Fibrillation I48.91  History:         Patient has prior history of Echocardiogram examinations, most                  recent 03/22/2018. Arrythmias:Atrial Fibrillation; Risk                  Factors:Hypertension.  Sonographer:     Kenton Kingfisher, ANN Referring Phys:  [id number] Sidney Ace Diagnosing Phys: Neoma Laming MD  Sonographer Comments: Technically difficult study due to poor echo windows, no subcostal window, suboptimal parasternal window and suboptimal apical window. IMPRESSIONS  1. Left ventricular ejection fraction, by estimation, is 60 to 65%. The left ventricle has normal function. The left ventricle has no regional wall motion abnormalities. There is mild concentric left ventricular hypertrophy. Left ventricular diastolic parameters are indeterminate.  2. Right ventricular systolic function is normal. The right ventricular size is normal.  3. The mitral valve is normal in structure. No evidence of mitral valve regurgitation. No evidence of mitral stenosis.  4. The aortic valve is normal in structure. Aortic valve regurgitation is not visualized. No aortic stenosis is present.  5. The inferior vena cava is normal in size with greater than 50% respiratory variability, suggesting right atrial pressure of 3 mmHg. FINDINGS  Left Ventricle: Left ventricular ejection fraction, by estimation, is 60 to 65%. The left ventricle has normal function. The left ventricle has no regional wall motion abnormalities. The left ventricular internal cavity size was normal in size. There is  mild concentric left ventricular hypertrophy. Left ventricular diastolic parameters are indeterminate. Right Ventricle: The right ventricular size is normal. No increase in right ventricular wall thickness. Right ventricular systolic function is normal. Left Atrium: Left atrial size was  normal in size. Right Atrium: Right atrial size was normal in size. Pericardium: There is no evidence of pericardial effusion. Mitral Valve: The mitral valve is normal in structure. No evidence of mitral valve regurgitation. No evidence of mitral valve stenosis. Tricuspid Valve: The tricuspid valve is normal in structure. Tricuspid valve regurgitation is not demonstrated. No evidence of tricuspid stenosis. Aortic Valve: The aortic valve is normal in structure. Aortic valve regurgitation is not visualized. No aortic stenosis is present. Aortic valve mean gradient measures 2.0 mmHg. Aortic valve peak gradient measures 4.3 mmHg. Aortic valve area, by VTI measures 3.40 cm. Pulmonic Valve: The pulmonic valve was normal in structure. Pulmonic valve regurgitation is not visualized. No evidence of pulmonic stenosis. Aorta: The aortic root is normal in size and structure. Venous: The inferior  vena cava is normal in size with greater than 50% respiratory variability, suggesting right atrial pressure of 3 mmHg. IAS/Shunts: No atrial level shunt detected by color flow Doppler.  LEFT VENTRICLE PLAX 2D LVIDd:         5.32 cm  Diastology LVIDs:         3.62 cm  LV e' medial:   105.50 cm/s LV PW:         1.35 cm  LV E/e' medial: 1.0 LV IVS:        1.14 cm LVOT diam:     2.00 cm LV SV:         57 LV SV Index:   25 LVOT Area:     3.14 cm  LEFT ATRIUM         Index LA diam:    5.20 cm 2.24 cm/m  AORTIC VALVE AV Area (Vmax):    2.59 cm AV Area (Vmean):   2.54 cm AV Area (VTI):     3.40 cm AV Vmax:           104.00 cm/s AV Vmean:          67.000 cm/s AV VTI:            0.169 m AV Peak Grad:      4.3 mmHg AV Mean Grad:      2.0 mmHg LVOT Vmax:         85.90 cm/s LVOT Vmean:        54.200 cm/s LVOT VTI:          0.183 m LVOT/AV VTI ratio: 1.08  AORTA Ao Root diam: 3.60 cm MITRAL VALVE MV Area (PHT): 7.44 cm     SHUNTS MV Decel Time: 102 msec     Systemic VTI:  0.18 m MV E velocity: 103.00 cm/s  Systemic Diam: 2.00 cm Neoma Laming  MD Electronically signed by Neoma Laming MD Signature Date/Time: 06/17/2020/11:27:45 AM    Final       Subjective: Alert, denies pain   Discharge Exam: Vitals:   06/27/20 0423 06/27/20 0817  BP: 118/70 113/71  Pulse: 98 62  Resp: (!) 22 18  Temp: 99.2 F (37.3 C) 97.9 F (36.6 C)  SpO2: 95% 97%     General: Pt is alert, awake, not in acute distress Cardiovascular: RRR, S1/S2 +, no rubs, no gallops Respiratory: CTA bilaterally, no wheezing, no rhonchi Abdominal: Soft, NT, ND, bowel sounds + Extremities: no edema, no cyanosis    The results of significant diagnostics from this hospitalization (including imaging, microbiology, ancillary and laboratory) are listed below for reference.     Microbiology: No results found for this or any previous visit (from the past 240 hour(s)).   Labs: BNP (last 3 results) No results for input(s): BNP in the last 8760 hours. Basic Metabolic Panel: No results for input(s): NA, K, CL, CO2, GLUCOSE, BUN, CREATININE, CALCIUM, MG, PHOS in the last 168 hours. Liver Function Tests: No results for input(s): AST, ALT, ALKPHOS, BILITOT, PROT, ALBUMIN in the last 168 hours. No results for input(s): LIPASE, AMYLASE in the last 168 hours. No results for input(s): AMMONIA in the last 168 hours. CBC: No results for input(s): WBC, NEUTROABS, HGB, HCT, MCV, PLT in the last 168 hours. Cardiac Enzymes: No results for input(s): CKTOTAL, CKMB, CKMBINDEX, TROPONINI in the last 168 hours. BNP: Invalid input(s): POCBNP CBG: No results for input(s): GLUCAP in the last 168 hours. D-Dimer No results for input(s): DDIMER in the last  72 hours. Hgb A1c No results for input(s): HGBA1C in the last 72 hours. Lipid Profile No results for input(s): CHOL, HDL, LDLCALC, TRIG, CHOLHDL, LDLDIRECT in the last 72 hours. Thyroid function studies No results for input(s): TSH, T4TOTAL, T3FREE, THYROIDAB in the last 72 hours.  Invalid input(s): FREET3 Anemia work up No  results for input(s): VITAMINB12, FOLATE, FERRITIN, TIBC, IRON, RETICCTPCT in the last 72 hours. Urinalysis    Component Value Date/Time   COLORURINE YELLOW (A) 06/30/2015 0810   APPEARANCEUR Clear 03/25/2019 1354   LABSPEC 1.023 06/30/2015 0810   PHURINE 5.0 06/30/2015 0810   GLUCOSEU Negative 03/25/2019 1354   HGBUR NEGATIVE 06/30/2015 0810   BILIRUBINUR Negative 03/25/2019 1354   KETONESUR NEGATIVE 06/30/2015 0810   PROTEINUR 3+ (A) 03/25/2019 1354   PROTEINUR >500 (A) 06/30/2015 0810   NITRITE Negative 03/25/2019 1354   NITRITE NEGATIVE 06/30/2015 0810   LEUKOCYTESUR Negative 03/25/2019 1354   Sepsis Labs Invalid input(s): PROCALCITONIN,  WBC,  LACTICIDVEN Microbiology No results found for this or any previous visit (from the past 240 hour(s)).   Time coordinating discharge: 40 minutes  SIGNED:   Elmarie Shiley, MD  [facility]

## 2020-06-27 NOTE — Care Management Important Message (Signed)
Important Message  Patient Details  Name: Christopher Delacruz MRN: [id number] Date of Birth: Jun 07, 1950   Medicare Important Message Given:  Other (see comment)  Patient is on comfort care and discharging to the [facility] this afternoon.  Out of respect for the patient and family no Important Message from [facility] given.  Juliann Pulse A Marisabel Macpherson 06/27/2020, 11:23 AM

## 2020-06-27 NOTE — TOC Transition Note (Signed)
Transition of Care [facility]) - CM/SW Discharge Note   Patient Details  Name: Christopher Delacruz MRN: [id number] Date of Birth: 15-Apr-1950  Transition of [facility]) CM/SW Contact:  Shelbie Hutching, RN Phone Number: 06/27/2020, 9:32 AM   Clinical Narrative:    Patient has a bed at the [facility] in [place].  Patient will discharge this afternoon.  Hospice Facility is asking for a 5 pm pickup.  [facility] has been arranged for pick up at 5 pm.     Final next level of care: [place] Barriers to Discharge: No Barriers Identified   Patient Goals and CMS Choice Patient states their goals for this hospitalization and ongoing recovery are:: Comfort care- residential hospice CMS Medicare.gov Compare Post Acute Care list provided to:: Patient Represenative (must comment) Choice offered to / list presented to : [place] / [place]  Discharge Placement              Patient chooses bed at: Other - please specify in the comment section below: [facility]) Patient to be transferred to facility by: [facility] Name of family member notified: Reynald Woods Patient and family notified of of transfer: 06/27/20  Discharge Plan and Services In-house Referral: Clinical Social Work   Post Acute Care Choice: Hospice                    HH Arranged: NA          Social Determinants of Health (SDOH) Interventions     Readmission Risk Interventions Readmission Risk Prevention Plan 05/28/2020  Transportation Screening Complete  HRI or Home Care Consult Complete  Social Work Consult for [place] Planning/Counseling Complete  Palliative Care Screening Not Applicable  Medication Review Press photographer) Complete  Some recent data might be hidden

## 2020-06-27 NOTE — Progress Notes (Signed)
Manufacturing engineer [facility])  Hospice Home has a bed for Mr. Anzalone today.  Discussed with Gala Murdoch, and they would like to accept the bed at [facility].  [name] will have to complete necessary consent prior to Mr. Hackler being transferred to [facility].  Updated TOC manager and attending via epic chat.  Please leave IV access in place.   Please send completed DNR with Mr. Rendell.  RN staff, please call report to [phone], room is assigned when report is called.  Thank you, Venia Carbon RN, BSN, [facility] Liaison

## 2020-06-27 NOTE — Plan of Care (Signed)

## 2020-06-27 NOTE — Progress Notes (Signed)
Report called to [place] RN at hospice home. [facility]

## 2020-07-23 ENCOUNTER — Ambulatory Visit: Payer: Medicare Other | Admitting: Radiation Oncology

## 2020-07-27 DEATH — deceased

## 2020-10-10 ENCOUNTER — Ambulatory Visit: Payer: Medicare Other | Admitting: Internal Medicine

## 2022-02-05 IMAGING — NM NM GI BLOOD LOSS
2 series · 12 of 12 positions shown · non-contrast
Comparison: None.

CLINICAL DATA: Hemali clot in stool.  Decreased hemoglobin.

EXAM:
NUCLEAR MEDICINE GASTROINTESTINAL BLEEDING SCAN
TECHNIQUE: Sequential abdominal images were obtained following intravenous
administration of Lc-RRm labeled red blood cells.
RADIOPHARMACEUTICALS:  23.24 mCi Lc-RRm pertechnetate in-vitro
labeled red cells.

[Series 1000: hour 1 gi bleed · 4.80mm/px · 6 of 60 frames shown]
[frame 6/60]
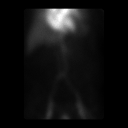
[frame 16/60]
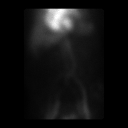
[frame 26/60]
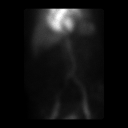
[frame 36/60]
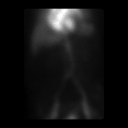
[frame 46/60]
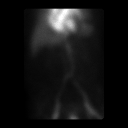
[frame 56/60]
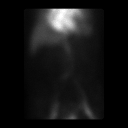

[Series 1000: gi bleed hour 2 · 4.80mm/px · 6 of 60 frames shown]
[frame 6/60]
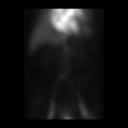
[frame 16/60]
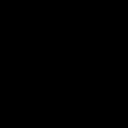
[frame 26/60]
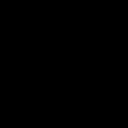
[frame 36/60]
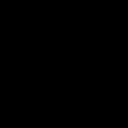
[frame 46/60]
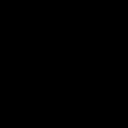
[frame 56/60]
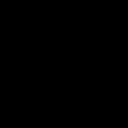

[12 of 12 positions shown; findings below may reference images not displayed]

FINDINGS: Satisfactory uptake is present into the blood pool. Aorta and iliac
vessels are well visualized. No bowel uptake is present within the
first hour. Patient refused additional imaging into the second hour.
IMPRESSION: 1. No evidence for GI hemorrhage.
